# Patient Record
Sex: Female | Born: 1955
Health system: Southern US, Community
[De-identification: ages and names within clinical notes are randomized; demographics above are authoritative.]

## PROBLEM LIST (undated history)

## (undated) DIAGNOSIS — E119 Type 2 diabetes mellitus without complications: Secondary | ICD-10-CM

## (undated) DIAGNOSIS — R7303 Prediabetes: Secondary | ICD-10-CM

## (undated) DIAGNOSIS — F419 Anxiety disorder, unspecified: Secondary | ICD-10-CM

## (undated) DIAGNOSIS — E669 Obesity, unspecified: Secondary | ICD-10-CM

## (undated) DIAGNOSIS — I10 Essential (primary) hypertension: Secondary | ICD-10-CM

## (undated) DIAGNOSIS — R569 Unspecified convulsions: Secondary | ICD-10-CM

## (undated) DIAGNOSIS — G5600 Carpal tunnel syndrome, unspecified upper limb: Secondary | ICD-10-CM

## (undated) DIAGNOSIS — K625 Hemorrhage of anus and rectum: Secondary | ICD-10-CM

## (undated) DIAGNOSIS — R259 Unspecified abnormal involuntary movements: Secondary | ICD-10-CM

## (undated) DIAGNOSIS — R29810 Facial weakness: Secondary | ICD-10-CM

## (undated) DIAGNOSIS — F339 Major depressive disorder, recurrent, unspecified: Secondary | ICD-10-CM

## (undated) DIAGNOSIS — K649 Unspecified hemorrhoids: Secondary | ICD-10-CM

## (undated) HISTORY — DX: Major depressive disorder, recurrent, unspecified: F33.9

## (undated) HISTORY — PX: HERNIA REPAIR: SHX51

## (undated) HISTORY — DX: Anxiety disorder, unspecified: F41.9

## (undated) HISTORY — DX: Type 2 diabetes mellitus without complications: E11.9

## (undated) HISTORY — DX: Facial weakness: R29.810

## (undated) HISTORY — DX: Unspecified hemorrhoids: K64.9

## (undated) HISTORY — DX: Unspecified abnormal involuntary movements: R25.9

## (undated) HISTORY — DX: Prediabetes: R73.03

## (undated) HISTORY — PX: MYOMECTOMY: SHX85

## (undated) HISTORY — DX: Obesity, unspecified: E66.9

## (undated) HISTORY — PX: ABDOMINAL HYSTERECTOMY: SHX81

## (undated) HISTORY — DX: Hemorrhage of anus and rectum: K62.5

---

## 1997-11-20 ENCOUNTER — Encounter: Payer: Self-pay | Admitting: Family Medicine

## 1997-11-20 ENCOUNTER — Ambulatory Visit (HOSPITAL_COMMUNITY): Admission: RE | Admit: 1997-11-20 | Discharge: 1997-11-20 | Payer: Self-pay | Admitting: Family Medicine

## 1998-01-15 ENCOUNTER — Encounter: Admission: RE | Admit: 1998-01-15 | Discharge: 1998-01-15 | Payer: Self-pay | Admitting: Obstetrics & Gynecology

## 2002-01-31 ENCOUNTER — Encounter: Admission: RE | Admit: 2002-01-31 | Discharge: 2002-01-31 | Payer: Self-pay | Admitting: Family Medicine

## 2002-02-14 ENCOUNTER — Other Ambulatory Visit: Admission: RE | Admit: 2002-02-14 | Discharge: 2002-02-14 | Payer: Self-pay | Admitting: Family Medicine

## 2002-02-14 ENCOUNTER — Encounter: Admission: RE | Admit: 2002-02-14 | Discharge: 2002-02-14 | Payer: Self-pay | Admitting: Family Medicine

## 2002-02-28 ENCOUNTER — Encounter: Admission: RE | Admit: 2002-02-28 | Discharge: 2002-02-28 | Payer: Self-pay | Admitting: Family Medicine

## 2002-03-31 ENCOUNTER — Encounter: Admission: RE | Admit: 2002-03-31 | Discharge: 2002-03-31 | Payer: Self-pay | Admitting: Family Medicine

## 2002-04-07 ENCOUNTER — Encounter: Admission: RE | Admit: 2002-04-07 | Discharge: 2002-04-07 | Payer: Self-pay | Admitting: Family Medicine

## 2002-05-09 ENCOUNTER — Encounter: Admission: RE | Admit: 2002-05-09 | Discharge: 2002-05-09 | Payer: Self-pay | Admitting: Family Medicine

## 2002-07-04 ENCOUNTER — Encounter: Admission: RE | Admit: 2002-07-04 | Discharge: 2002-07-04 | Payer: Self-pay | Admitting: Family Medicine

## 2002-12-26 ENCOUNTER — Encounter: Admission: RE | Admit: 2002-12-26 | Discharge: 2002-12-26 | Payer: Self-pay | Admitting: Family Medicine

## 2003-03-13 ENCOUNTER — Encounter: Admission: RE | Admit: 2003-03-13 | Discharge: 2003-03-13 | Payer: Self-pay | Admitting: Family Medicine

## 2003-04-10 ENCOUNTER — Other Ambulatory Visit: Admission: RE | Admit: 2003-04-10 | Discharge: 2003-04-10 | Payer: Self-pay | Admitting: Family Medicine

## 2003-04-10 ENCOUNTER — Encounter: Admission: RE | Admit: 2003-04-10 | Discharge: 2003-04-10 | Payer: Self-pay | Admitting: Family Medicine

## 2004-03-04 ENCOUNTER — Ambulatory Visit: Payer: Self-pay | Admitting: Family Medicine

## 2004-03-24 ENCOUNTER — Ambulatory Visit: Payer: Self-pay | Admitting: Family Medicine

## 2004-04-01 ENCOUNTER — Ambulatory Visit: Payer: Self-pay | Admitting: Sports Medicine

## 2004-04-25 ENCOUNTER — Ambulatory Visit: Payer: Self-pay | Admitting: Sports Medicine

## 2004-05-06 ENCOUNTER — Ambulatory Visit: Payer: Self-pay | Admitting: Sports Medicine

## 2004-05-19 ENCOUNTER — Encounter (INDEPENDENT_AMBULATORY_CARE_PROVIDER_SITE_OTHER): Payer: Self-pay | Admitting: *Deleted

## 2004-05-20 ENCOUNTER — Ambulatory Visit: Payer: Self-pay | Admitting: Family Medicine

## 2004-05-20 ENCOUNTER — Other Ambulatory Visit: Admission: RE | Admit: 2004-05-20 | Discharge: 2004-05-20 | Payer: Self-pay | Admitting: Family Medicine

## 2004-06-03 ENCOUNTER — Ambulatory Visit: Payer: Self-pay | Admitting: Family Medicine

## 2004-08-07 ENCOUNTER — Inpatient Hospital Stay (HOSPITAL_COMMUNITY): Admission: RE | Admit: 2004-08-07 | Discharge: 2004-08-12 | Payer: Self-pay | Admitting: Psychiatry

## 2004-08-07 ENCOUNTER — Ambulatory Visit: Payer: Self-pay | Admitting: Family Medicine

## 2004-08-07 ENCOUNTER — Ambulatory Visit: Payer: Self-pay | Admitting: Psychiatry

## 2004-09-02 ENCOUNTER — Ambulatory Visit: Payer: Self-pay | Admitting: Family Medicine

## 2004-09-30 ENCOUNTER — Ambulatory Visit: Payer: Self-pay | Admitting: Family Medicine

## 2004-10-28 ENCOUNTER — Ambulatory Visit: Payer: Self-pay | Admitting: Family Medicine

## 2004-12-02 ENCOUNTER — Ambulatory Visit: Payer: Self-pay | Admitting: Family Medicine

## 2005-03-05 ENCOUNTER — Ambulatory Visit: Payer: Self-pay | Admitting: Family Medicine

## 2005-07-06 ENCOUNTER — Ambulatory Visit: Payer: Self-pay | Admitting: Family Medicine

## 2006-03-18 DIAGNOSIS — F331 Major depressive disorder, recurrent, moderate: Secondary | ICD-10-CM | POA: Insufficient documentation

## 2006-03-18 DIAGNOSIS — F339 Major depressive disorder, recurrent, unspecified: Secondary | ICD-10-CM

## 2006-03-18 DIAGNOSIS — L408 Other psoriasis: Secondary | ICD-10-CM | POA: Insufficient documentation

## 2006-03-18 DIAGNOSIS — I10 Essential (primary) hypertension: Secondary | ICD-10-CM | POA: Insufficient documentation

## 2006-03-18 HISTORY — DX: Major depressive disorder, recurrent, unspecified: F33.9

## 2006-03-19 ENCOUNTER — Encounter (INDEPENDENT_AMBULATORY_CARE_PROVIDER_SITE_OTHER): Payer: Self-pay | Admitting: *Deleted

## 2006-07-09 ENCOUNTER — Ambulatory Visit: Payer: Self-pay | Admitting: Family Medicine

## 2006-07-09 ENCOUNTER — Encounter: Payer: Self-pay | Admitting: Family Medicine

## 2006-07-09 DIAGNOSIS — L708 Other acne: Secondary | ICD-10-CM

## 2006-07-09 DIAGNOSIS — L709 Acne, unspecified: Secondary | ICD-10-CM | POA: Insufficient documentation

## 2006-08-09 ENCOUNTER — Ambulatory Visit: Payer: Self-pay | Admitting: Family Medicine

## 2006-08-09 ENCOUNTER — Encounter: Admission: RE | Admit: 2006-08-09 | Discharge: 2006-08-09 | Payer: Self-pay | Admitting: Sports Medicine

## 2006-08-09 ENCOUNTER — Encounter: Payer: Self-pay | Admitting: Family Medicine

## 2006-08-09 ENCOUNTER — Ambulatory Visit (HOSPITAL_COMMUNITY): Admission: RE | Admit: 2006-08-09 | Discharge: 2006-08-09 | Payer: Self-pay | Admitting: Family Medicine

## 2006-08-10 ENCOUNTER — Encounter: Payer: Self-pay | Admitting: Family Medicine

## 2006-08-10 LAB — CONVERTED CEMR LAB
HCT: 24.5 % — ABNORMAL LOW (ref 36.0–46.0)
MCHC: 28.6 g/dL — ABNORMAL LOW (ref 30.0–36.0)
MCV: 64 fL — ABNORMAL LOW (ref 78.0–100.0)
Platelets: 527 10*3/uL — ABNORMAL HIGH (ref 150–400)
RBC: 3.83 M/uL — ABNORMAL LOW (ref 3.87–5.11)
WBC: 5.5 10*3/uL (ref 4.0–10.5)

## 2006-08-11 ENCOUNTER — Ambulatory Visit: Payer: Self-pay | Admitting: Family Medicine

## 2006-08-11 ENCOUNTER — Encounter: Payer: Self-pay | Admitting: Family Medicine

## 2006-09-01 ENCOUNTER — Telehealth: Payer: Self-pay | Admitting: *Deleted

## 2006-09-16 ENCOUNTER — Ambulatory Visit: Payer: Self-pay | Admitting: Obstetrics & Gynecology

## 2006-09-16 ENCOUNTER — Other Ambulatory Visit: Admission: RE | Admit: 2006-09-16 | Discharge: 2006-09-16 | Payer: Self-pay | Admitting: Obstetrics and Gynecology

## 2006-09-16 ENCOUNTER — Encounter: Payer: Self-pay | Admitting: Obstetrics and Gynecology

## 2006-09-22 ENCOUNTER — Ambulatory Visit (HOSPITAL_COMMUNITY): Admission: RE | Admit: 2006-09-22 | Discharge: 2006-09-22 | Payer: Self-pay | Admitting: Obstetrics & Gynecology

## 2006-09-30 ENCOUNTER — Ambulatory Visit: Payer: Self-pay | Admitting: Obstetrics and Gynecology

## 2006-10-08 ENCOUNTER — Encounter: Payer: Self-pay | Admitting: Family Medicine

## 2006-11-02 ENCOUNTER — Ambulatory Visit: Payer: Self-pay | Admitting: Gynecology

## 2006-11-02 ENCOUNTER — Inpatient Hospital Stay (HOSPITAL_COMMUNITY): Admission: RE | Admit: 2006-11-02 | Discharge: 2006-11-05 | Payer: Self-pay | Admitting: Obstetrics and Gynecology

## 2006-11-02 ENCOUNTER — Encounter: Payer: Self-pay | Admitting: Obstetrics and Gynecology

## 2006-11-10 ENCOUNTER — Ambulatory Visit: Payer: Self-pay | Admitting: Obstetrics and Gynecology

## 2006-11-17 ENCOUNTER — Inpatient Hospital Stay (HOSPITAL_COMMUNITY): Admission: AD | Admit: 2006-11-17 | Discharge: 2006-12-01 | Payer: Self-pay | Admitting: Obstetrics & Gynecology

## 2006-11-22 ENCOUNTER — Encounter: Payer: Self-pay | Admitting: Obstetrics and Gynecology

## 2006-12-06 ENCOUNTER — Inpatient Hospital Stay (HOSPITAL_COMMUNITY): Admission: AD | Admit: 2006-12-06 | Discharge: 2006-12-06 | Payer: Self-pay | Admitting: Obstetrics and Gynecology

## 2006-12-22 ENCOUNTER — Ambulatory Visit: Payer: Self-pay | Admitting: Obstetrics & Gynecology

## 2007-01-04 ENCOUNTER — Ambulatory Visit: Payer: Self-pay | Admitting: Family Medicine

## 2007-01-04 ENCOUNTER — Encounter: Payer: Self-pay | Admitting: Family Medicine

## 2007-01-04 DIAGNOSIS — R259 Unspecified abnormal involuntary movements: Secondary | ICD-10-CM

## 2007-01-04 HISTORY — DX: Unspecified abnormal involuntary movements: R25.9

## 2007-01-04 LAB — CONVERTED CEMR LAB
BUN: 10 mg/dL (ref 6–23)
CO2: 21 meq/L (ref 19–32)
Calcium: 9.4 mg/dL (ref 8.4–10.5)
Chloride: 109 meq/L (ref 96–112)
HCT: 37.1 % (ref 36.0–46.0)
Platelets: 335 10*3/uL (ref 150–400)
RBC: 4.44 M/uL (ref 3.87–5.11)
Sodium: 143 meq/L (ref 135–145)

## 2007-02-09 ENCOUNTER — Encounter: Payer: Self-pay | Admitting: Family Medicine

## 2007-04-25 ENCOUNTER — Telehealth (INDEPENDENT_AMBULATORY_CARE_PROVIDER_SITE_OTHER): Payer: Self-pay | Admitting: *Deleted

## 2007-07-20 ENCOUNTER — Encounter: Payer: Self-pay | Admitting: Family Medicine

## 2007-07-20 ENCOUNTER — Ambulatory Visit: Payer: Self-pay | Admitting: Family Medicine

## 2007-07-20 DIAGNOSIS — L658 Other specified nonscarring hair loss: Secondary | ICD-10-CM

## 2007-07-20 DIAGNOSIS — M2140 Flat foot [pes planus] (acquired), unspecified foot: Secondary | ICD-10-CM | POA: Insufficient documentation

## 2007-07-20 DIAGNOSIS — E8941 Symptomatic postprocedural ovarian failure: Secondary | ICD-10-CM

## 2007-07-20 LAB — CONVERTED CEMR LAB
BUN: 10 mg/dL (ref 6–23)
Calcium: 10.3 mg/dL (ref 8.4–10.5)
Chloride: 104 meq/L (ref 96–112)
Creatinine, Ser: 0.72 mg/dL (ref 0.40–1.20)
Sodium: 139 meq/L (ref 135–145)

## 2007-07-21 ENCOUNTER — Encounter: Payer: Self-pay | Admitting: Family Medicine

## 2007-07-28 ENCOUNTER — Telehealth: Payer: Self-pay | Admitting: Family Medicine

## 2007-09-01 ENCOUNTER — Encounter: Payer: Self-pay | Admitting: Family Medicine

## 2007-09-20 ENCOUNTER — Telehealth: Payer: Self-pay | Admitting: Family Medicine

## 2007-10-17 ENCOUNTER — Ambulatory Visit: Payer: Self-pay | Admitting: Family Medicine

## 2007-10-17 DIAGNOSIS — E669 Obesity, unspecified: Secondary | ICD-10-CM

## 2007-10-17 DIAGNOSIS — Z6838 Body mass index (BMI) 38.0-38.9, adult: Secondary | ICD-10-CM | POA: Insufficient documentation

## 2007-10-17 HISTORY — DX: Obesity, unspecified: E66.9

## 2007-10-20 ENCOUNTER — Telehealth: Payer: Self-pay | Admitting: Family Medicine

## 2008-04-16 IMAGING — CR DG ABDOMEN 2V
2 series · 2 of 2 positions shown · non-contrast
Comparison: Previous exam on 11/21/06.

CLINICAL DATA: Small bowel obstruction.  
 ABDOMEN ? 2 VIEW:

[view not recorded (1 of 2)]
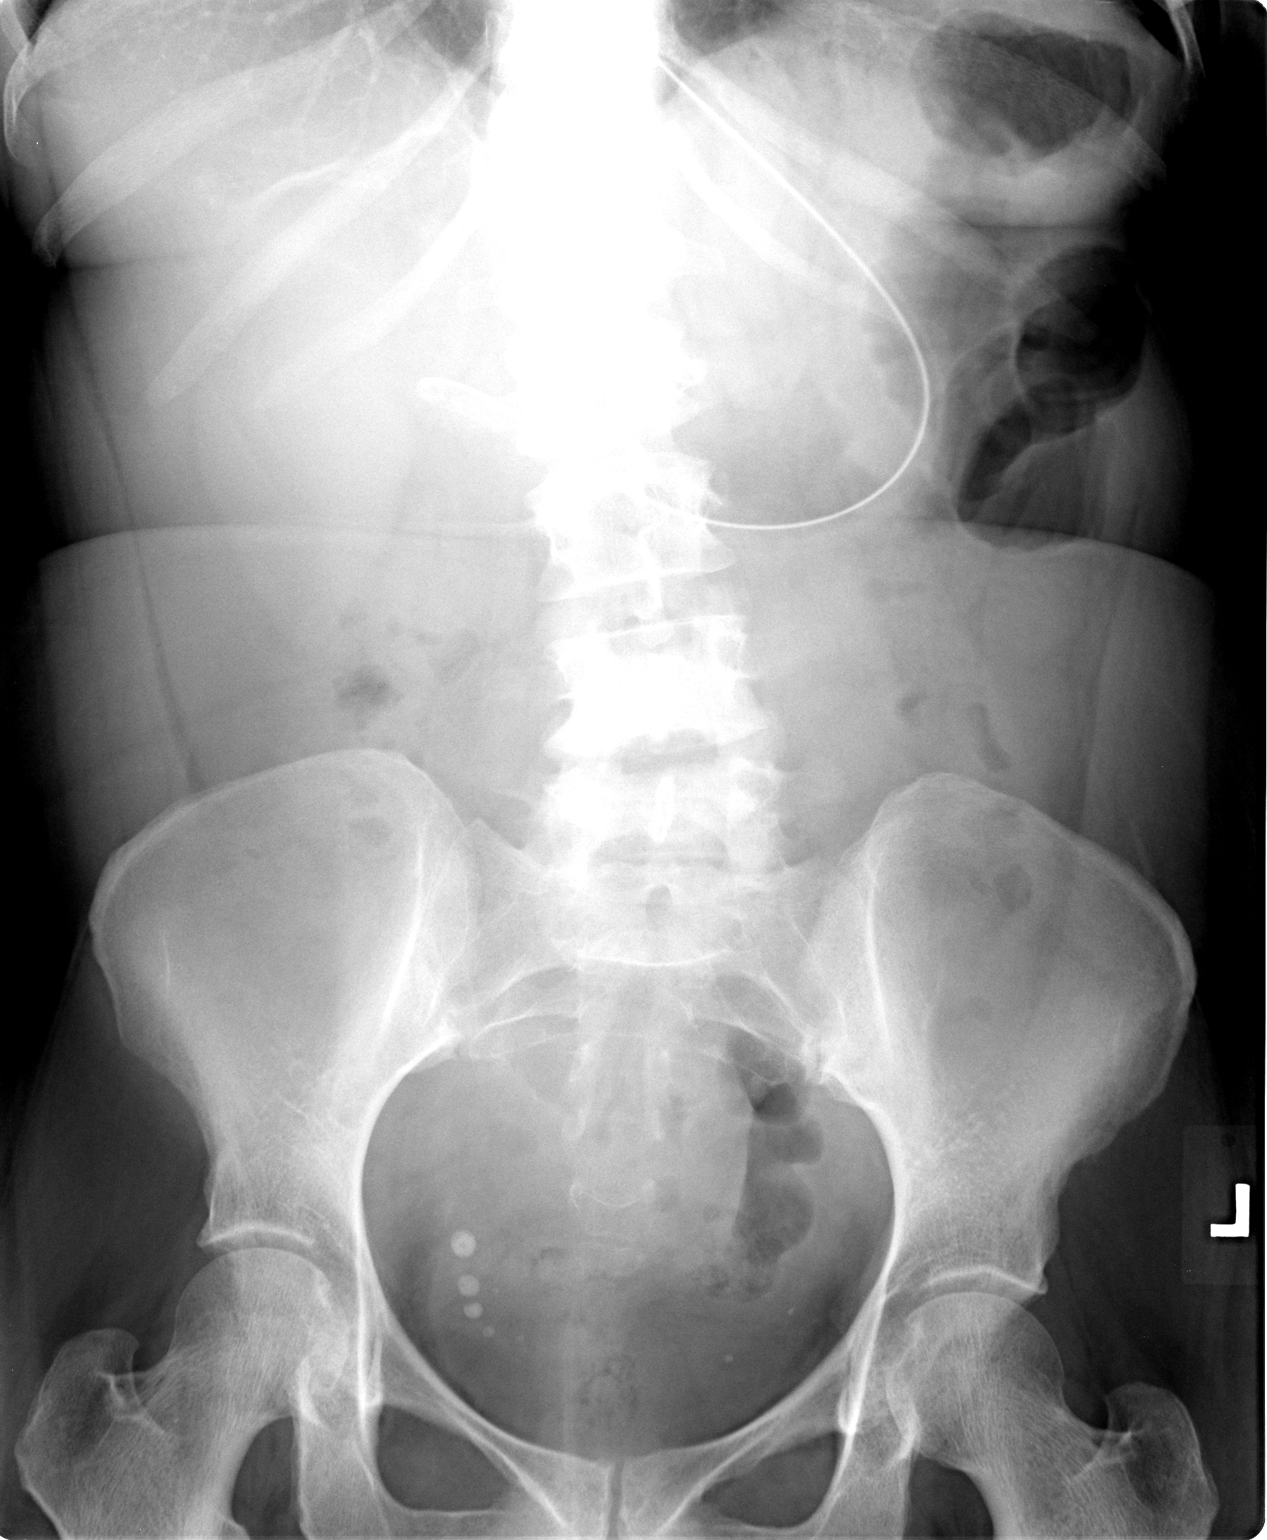

[view not recorded (2 of 2)]
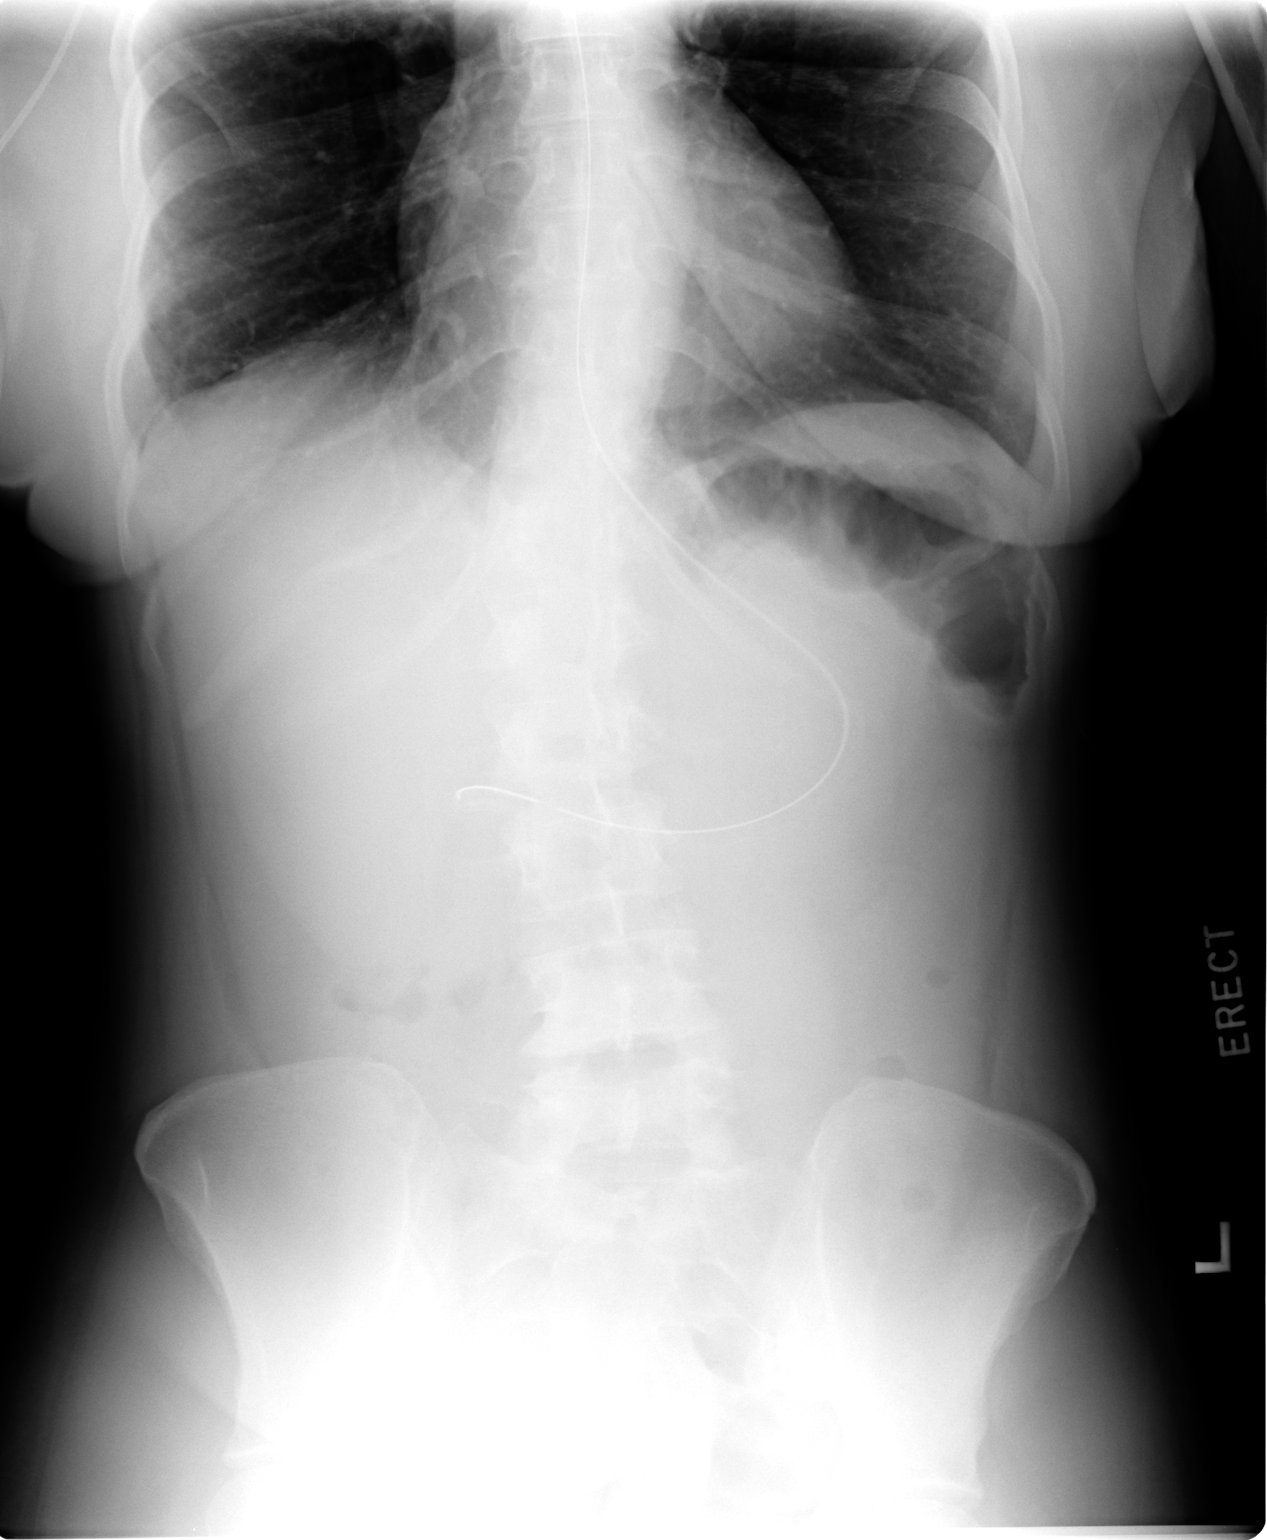

[2 of 2 positions shown; findings below may reference images not displayed]

FINDINGS: A few mildly dilated small bowel loops persist in the left upper quadrant.  No air fluid levels are seen on the upright film however to confirm continued small bowel obstruction in this location.  A nasogastric tube is in place with the tip located in the region of the gastric antrum.  A minimal amount of bowel gas is seen throughout the remainder of the abdomen with no definitive colonic gas evident.  Bony structures remain unchanged.
IMPRESSION: Persistent mildly distended small bowel loops in the left upper quadrant with no definite air fluid levels seen today.  A paucity of bowel gas is otherwise seen raising concern for persistent partial or complete small bowel obstructive process.

## 2008-04-18 IMAGING — CR DG ABDOMEN 1V
1 series · 1 of 1 positions shown · non-contrast
Comparison: 11/26/06.

CLINICAL DATA: Followup small bowel obstruction. 
ABDOMEN ? 1 VIEW:

[view not recorded]
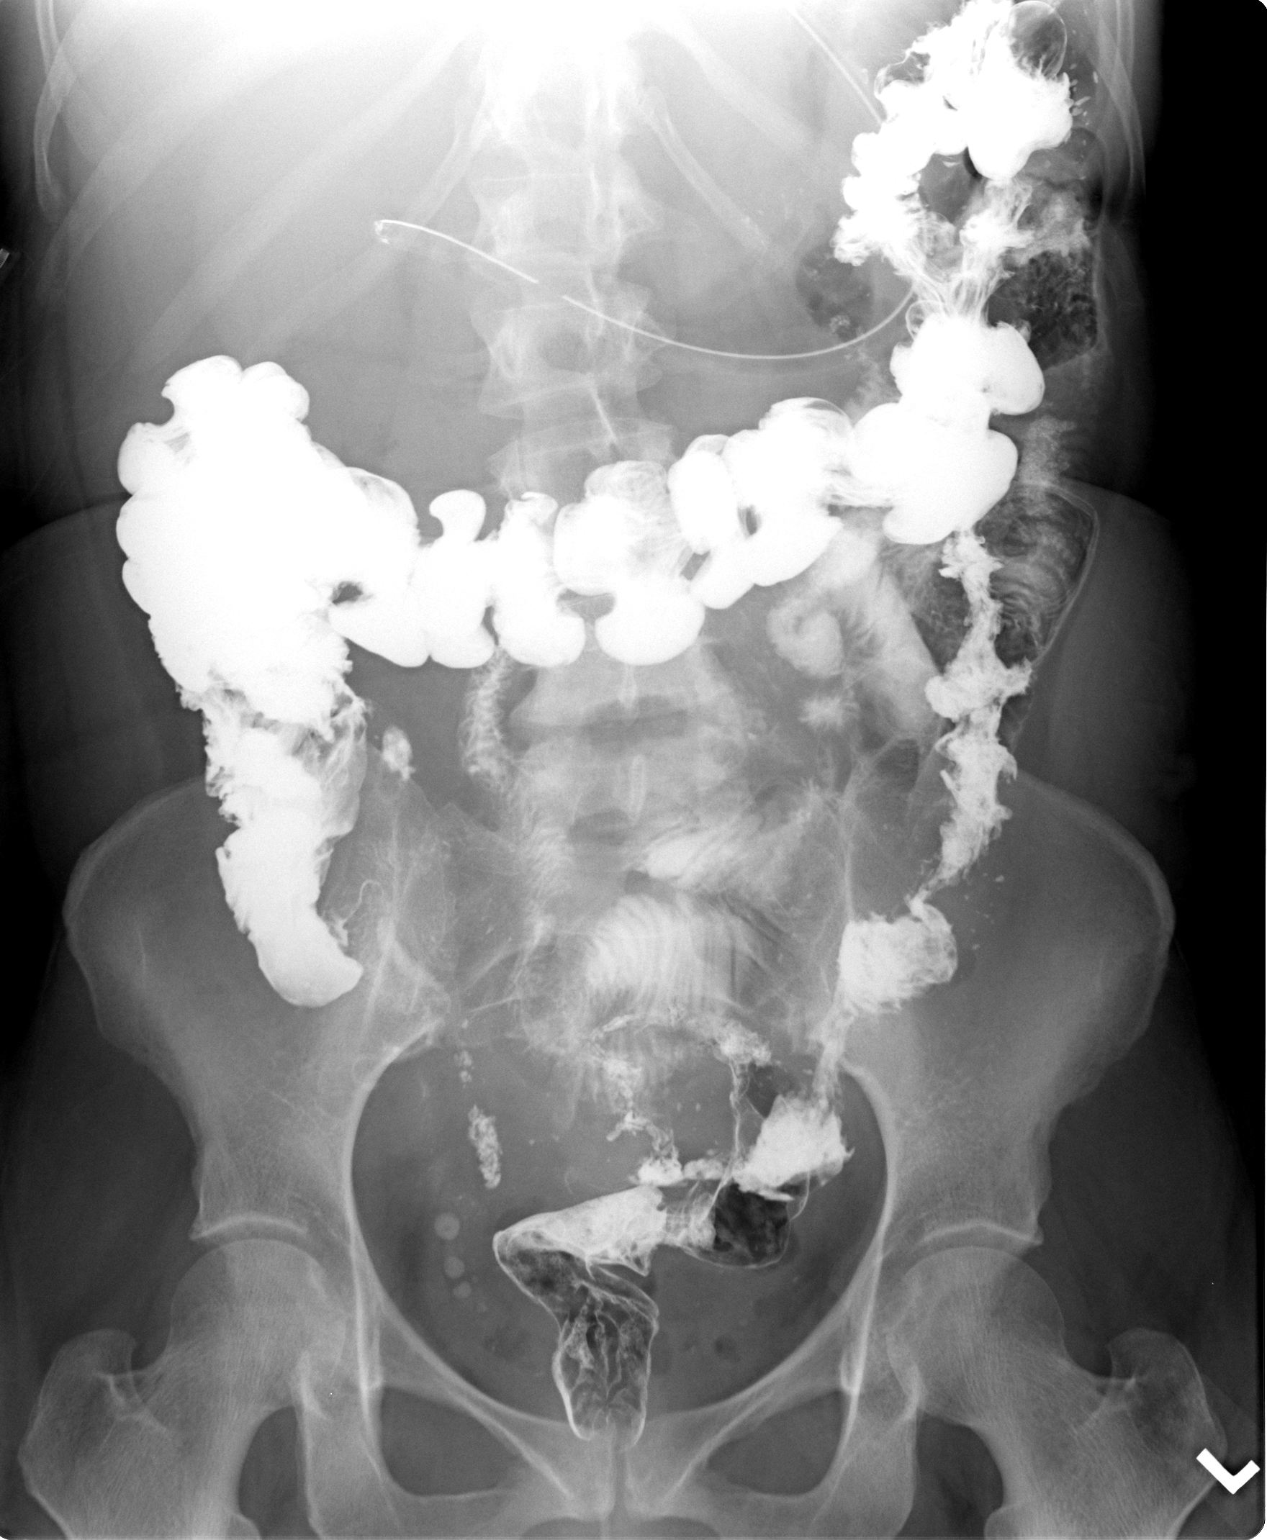

[1 of 1 positions shown; findings below may reference images not displayed]

FINDINGS: There has been passage of contrast from the small bowel throughout the majority of the colon since the prior study.  Minimal residual contrast is seen within the distal small bowel loops, several of which are mildly dilated but appear decreased since the prior study.  Nasogastric tube remains in place.
IMPRESSION: Decreased small bowel dilatation, with passage of contrast into the colon since the prior study.

## 2010-02-16 LAB — CONVERTED CEMR LAB
BUN: 14 mg/dL (ref 6–23)
CO2: 23 meq/L (ref 19–32)
Calcium: 9.2 mg/dL (ref 8.4–10.5)
Ferritin: <1 ng/mL — ABNORMAL LOW (ref 10–291)
Iron: 14 ug/dL — ABNORMAL LOW (ref 42–145)
Saturation Ratios: 4 % — ABNORMAL LOW (ref 20–55)
Sodium: 138 meq/L (ref 135–145)
Total Protein: 7.8 g/dL (ref 6.0–8.3)

## 2010-02-26 ENCOUNTER — Encounter: Payer: Self-pay | Admitting: *Deleted

## 2010-06-03 NOTE — Op Note (Signed)
[name], [name]             ACCOUNT NO.:  [id number]   MEDICAL RECORD NO.:  [id number]          PATIENT TYPE:  INP   LOCATION:  9320                          [facility]   PHYSICIAN:  Phil D. Okey Dupre, M.D.     DATE OF BIRTH:  [birth year]   DATE OF PROCEDURE:  11/02/2006  DATE OF DISCHARGE:                               OPERATIVE REPORT   PROCEDURE:  Abdominal hysterectomy, bilateral salpingo-oophorectomy and  lysis of pelvic adhesions.  Also, a ventral hernia repair, which will be  dictated separately.   SURGEON:  Javier Glazier. Okey Dupre, M.D.   FIRST ASSISTANT:  Ginger Carne, MD.   ANESTHESIA:  General.   ESTIMATED BLOOD LOSS:  500 mL.   SPECIMENS TO PATHOLOGY:  Uterus and ovaries.   POSTOPERATIVE CONDITION:  Satisfactory.   ANESTHESIA:  General.   Tape, instrument, sponge and needle count reported correct at the end of  the procedure.   OPERATIVE FINDINGS:  On entering the peritoneal cavity, the uterus was  markedly irregular and about the size of a 16-week pregnancy, markedly  tied down with a thick pelvic adhesions.  The left ovary was trapped  behind the broad ligament.  The right ovary was attached to the right  pelvic wall and was dominated by a large cyst measuring 10 x 7 x 7 cm,  translucent and clear in appearance with enlarged dilated veins in the  utero-ovarian ligament.  Also noted was a ventral hernia which was in  the superior portion of the incision just above the umbilicus right in  the midline and measured 3-4 cm in diameter right between the rectus  muscles.   DESCRIPTION OF PROCEDURE:  Under satisfactory general anesthesia with  the patient in dorsal supine position, the abdomen and vagina having  been prepped and a Foley catheter placed in the urinary bladder.  The  abdomen was draped in the usual sterile manner.  The abdomen was entered  through a vertical midline incision extending from the symphysis pubis  up hockey-sticking around the umbilicus  and going up approximately 4 cm  and back to the midline and up above the umbilicus to allow for future  repair during this procedure of the umbo of the ventral hernia.  The  abdomen was then entered by layers.  Subcutaneous bleeders were  controlled with hot cautery on entry.  The findings were as above.  The  Balfour retractor was placed in the peritoneal cavity.  The uterus  brought out of the incision.  The pelvic adhesions had to be freed up by  sharp and blunt dissection in order to do this.  Both ureters were  identified during this procedure.  The right ovary, which was plastered  down to the right wall of the uterus, was separated by sharp dissection  of the adhesions holding it there and sharp dissection to clear the  adhesions from the cul-de-sac.  Once this was done, the right round  ligament was ligated, divided and the anterior leaf of the broad  ligament opened parallel to the uterus and extending around the anterior  superior portion  of the cervix.  A straight Kocher clamp had been placed  across the utero-ovarian ligament, the fallopian tube, the round  ligament just lateral to the uterus on that side for traction and  hemostasis.  Once this was done, the area around the infundibular pelvic  ligament on that side was freed up of adhesions by sharp and blunt  dissection.  The infundibular pelvic ligament was clamped after  identifying once again the ureter, divided and doubly ligated with 1  chromic catgut suture ligatures.  This helped free up the ovary which  needed more adhesion lysing to free it up.  Once this was done, a clamp  was placed across the remaining pedicle close to the uterus and through  an opening made in avascular portion of broad ligament.  The tissue  between them divided and the ovary dissected free and sent for frozen  section.  The round ligament then on the left side was identified and  ligated with #1 chromic catgut suture ligature, divided and  anterior  leaf of the broad ligament opened parallel to the uterus around to the  anterior superior portion of the cervix.  The bladder was freed up by  sharp dissection and the bladder pushed away from the lower uterine  segment by sharp and blunt dissection.  The uterine vessels were then  skeletonized on the left side.  The ovary was plastered against the  posterior leaf of the broad ligament so some of that was included in the  clamp that was used to catch the dissected free uterine vessels with a  Heaney clamp.  The vessels were divided and ligated with 1 chromic  catgut suture ligature.  The cardinal ligaments on that side were then  clamped with a straight Heaney clamp, divided and ligated with 1 chromic  catgut suture ligature making sure that we identified the ureter prior  to doing this.  Returning to the left side, the uterine vessels were  skeletonized, divided and ligated with 1 chromic catgut suture ligatures  after having been clamped with a curved Heaney clamp.  The cardinal  ligament was then similarly grasped with a straight Haney clamp, divided  and ligated with 1 chromic catgut suture ligature.  Once this was done  of the utero-ovarian ligament on the left, we returned to that, was  freed up identified, ligated with 1 chromic, divided after having been  clamped with a Heaney clamp and ligated with 1 chromic catgut suture  ligatures and a free tie of 1 chromic catgut.  The remaining portion of  the broad ligament on the left was judiciously divided, dissected away  from the area, making sure that we were well away from the ureter,  hemostat clamping bleeders along the way and ligating them with 1  chromic catgut suture ligatures and thus removing the ovary on that side  separately.  Once this was done, we were already down to the apex of the  vagina and curved Heaney clamps were used to clamp across the vagina and  the cervix was dissected away from the apex of the  vagina by sharp  dissection.  Figure-of-eight 1 chromic catgut sutures were used for each  of the lateral vaginal cuff sutures that included the angle of the  vaginal cuff on each side.  This having am accomplished, the uterus was  removed in toto.  At this point both ovaries had been removed with the  tubes and the pelvis was irrigated with normal saline.  No active  bleeding was noted.  Attention was then directed to the ventral hernia  which will be dictated separately.  Once the hernia repair had been  carried out, the rectus fascia was closed with a 0 PDS double suture  with a loop in the Smead-Jones type of closure.  This was run down  halfway towards the symphysis, at which point a second suture was used  of the same material to finish the closure of the fascia.  Subcutaneous  bleeders, once again were controlled with hot cautery.  The subcutaneous  tissue was irrigated with normal saline and skin edges were approximated  with skin staples.  A dry sterile dressing was applied.  The patient was  transferred to the recovery room in satisfactory condition having  tolerated procedure well with the Foley catheter draining clear amber  urine.  Tape, sponge and needle count was reported correct at the end of  the procedure as well as the instruments      Phil D. Okey Dupre, M.D.  Electronically Signed     PDR/MEDQ  D:  11/02/2006  T:  11/03/2006  Job:  [id number]

## 2010-06-03 NOTE — Discharge Summary (Signed)
[name], [name]             ACCOUNT NO.:  [id number]   MEDICAL RECORD NO.:  [id number]          PATIENT TYPE:  INP   LOCATION:  9320                          [facility]   PHYSICIAN:  Phil D. Okey Dupre, M.D.     DATE OF BIRTH:  [birth year]   DATE OF ADMISSION:  11/02/2006  DATE OF DISCHARGE:  11/05/2006                               DISCHARGE SUMMARY   The patient a 55 year old African-American female who underwent  exploratory laparotomy, total abdominal hysterectomy and bilateral  salpingo-oophorectomy with lysis of pelvic adhesions and repair of  ventral hernia on the day of admission.  She has done well  postoperatively with the exception of a falling hemoglobin although her  abdomen is benign.  There is no sign of active bleeding.  The patient  had a hemoglobin of around 9 in the recovery room.  It went down to 6.9  on postoperative day 1 and 5.2 on postoperative day #2.  She was given 2  units of packed cells to raise it to 7.1.  The patient is stable.  She  has never had any significant dizziness and has had normal orthostatics  since the surgery.   EXAMINATION ON THE DAY OF DISCHARGE:  HEENT:  Within normal limits.  NECK:  Supple with no masses.  LUNGS:  Clear to auscultation and percussion.  HEART:  No murmur.  Normal sinus rhythm.  PMI in fifth intercostal space  in midclavicular line.  ABDOMEN:  Soft, flat, normally tender with a large well-healing midline  incision with no sign of erythema or inflammation.  Active bowel sounds.  No CVA tenderness.  __________ .  No significant genital bleeding.  EXTREMITIES:  Negative Homans' sign.  No edema.  DTRs within normal  limits.   IMPRESSION:  The patient in satisfactory condition, ready for discharge.  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  The patient's plan is to receive one more  unit of packed cells,  i.e., for a total of three prior to her discharge.  Follow up in 5 to 6 days in the GYN clinic for staple removal, and then  she will be seen again in 2 weeks for a routine postoperative  evaluation.  She has been given strict activity and dietary instructions  as well as those in relation to followup.  We have especially cautioned  her against heavy lifting and stairs to try and keep those to a minimum.   MEDICATIONS ON DISCHARGE:  New medications will be:  1. Percocet 5 mg #40 q.4 h p.r.n. for pain.  2. Motrin 600 mg #60 one q.6 h p.r.n. for pain.  3. Slow Fe #90 one q.a.m. once bowel movements begin to be regular.   Her preoperative medications will stay the same except that she will no  longer be on Loestrin, she will not be taking her regular iron, and she  will probably follow up with her Botox injections in her leg with her  doctor in [place].   DISCHARGE DIAGNOSIS:  Satisfactory  progress post total abdominal  hysterectomy, bilateral salpingo-oophorectomy and repair of ventral  hernia      Phil D. Okey Dupre, M.D.  Electronically Signed     PDR/MEDQ  D:  11/05/2006  T:  11/06/2006  Job:  [id number]

## 2010-06-03 NOTE — Op Note (Signed)
[name], CASEBIER             ACCOUNT NO.:  [id number]   MEDICAL RECORD NO.:  [id number]          PATIENT TYPE:  INP   LOCATION:  9320                          [facility]   PHYSICIAN:  Phil D. Okey Dupre, M.D.     DATE OF BIRTH:  21-May-1955   DATE OF PROCEDURE:  DATE OF DISCHARGE:                               OPERATIVE REPORT   PROCEDURE:  Ventral hernia repair.   PREOPERATIVE DIAGNOSIS:  Ventral hernia, symptomatic.   POSTOPERATIVE DIAGNOSIS:  Ventral hernia, symptomatic.   SURGEON:  Ginger Carne, MD   FIRST ASSISTANT:  Dr. Kristen Loader   ANESTHESIA:  General.   ESTIMATED BLOOD LOSS:  Minimal.   SPECIMENS TO PATHOLOGY:  None.   POSTOPERATIVE CONDITION:  Satisfactory.   This procedure was done as a portion of the total abdominal  hysterectomy, bilateral salpingo-oophorectomy, lysis of adhesions  previously dictated.  This portion was undertaken by Dr. Mia Creek with  the incision opened as in the previous dictation.  The hernia was  repaired by closing the rectus fascia over the hernia defect using  interrupted 0 Prolene sutures.  This was done with virtually no blood  loss.  The area was observed.  There was no sign of any defect at this  end of this procedure and the incision closure was carried out as  previously dictated.      Phil D. Okey Dupre, M.D.  Electronically Signed     PDR/MEDQ  D:  11/02/2006  T:  11/03/2006  Job:  [id number]

## 2010-06-03 NOTE — Group Therapy Note (Signed)
[name], CARBONI NO.:  [id number]   MEDICAL RECORD NO.:  [id number]          PATIENT TYPE:  WOC   LOCATION:  [facility]                   [facility]AC[facility]   PHYSICIAN:  Argentina Donovan, MD        DATE OF BIRTH:  [birth year]   DATE OF SERVICE:                                  CLINIC NOTE   The patient is a 55 year old gravida 2, para 1-0-1-1 with a daughter age  12 who was last seen by Dr. Silas Flood 2 weeks ago because of extremely heavy  periods.  She had an ultrasound which showed a 22-week size uterus with  multiple irregular full fibroids.  She came in today for results, is  still bleeding, and we have talked to her about alternatives and we are  going to schedule her for total abdominal hysterectomy.  In addition to  this, she has a ventral hernia that was picked up on ultrasound.  It is  easily palpable when the abdomen is examined.  It will be corrected at  that time.   PAST MEDICAL HISTORY:  The patient has had 1 cesarean section.  She has  had normal Pap smears and her heavy bleeding started about 1 year ago.  The patient has medical history of focal dystonia for which the patient  is followed by Dr. __________  In Neurology in [place], and he is  injecting her calf muscle with Botox.  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   MEDICATION:  She is taking iron sulfate, Zoloft 50 mg a day, has been on  Loestrin and takes __________ 25 a day for blood pressure.   PHYSICAL EXAMINATION:  VITAL SIGNS:  Her blood pressure is 111/81,  weight is 150, the pulse is 80, temperatures is 97.6.  ABDOMEN:  Soft, easily palpable mass up to the umbilicus with an obvious  hernia above the umbilicus where the bowel can be palpated and which is  causing her discomfort.   PLAN:  The patient will come and be seen prior to the surgery.  Will  schedule this with myself or Dr. Silas Flood and Dr. Mia Creek.           ______________________________  Argentina Donovan,  MD     PR/MEDQ  D:  09/30/2006  T:  10/01/2006  Job:  [id number]

## 2010-06-03 NOTE — Discharge Summary (Signed)
NAMEDANALI, [name]             ACCOUNT NO.:  [id number]   MEDICAL RECORD NO.:  [id number]            PATIENT TYPE:   LOCATION:                                [facility]   PHYSICIAN:  Phil D. Okey Dupre, M.D.          DATE OF BIRTH:   DATE OF ADMISSION:  11/02/2006  DATE OF DISCHARGE:                               DISCHARGE SUMMARY   CHIEF COMPLAINT:  Abnormal uterine bleeding.   The patient is a 55 year old, gravida 2, para 1-0-1-1, who presented to  our clinic from referral because of continual vaginal bleeding. At one  time she had hematocrit of 24 and was placed on iron therapy. She has  had extremely heavy periods over the past several years, and, on  examination, she has an enormous uterus that goes up above the  umbilicus. We have talked about the alternatives, and she desires to go  ahead with abdominal hysterectomy. In addition to this, she has a  ventral hernia that was picked up on the ultrasound, easily palpable in  the abdomen was examined and will be corrected at the time of surgery.   The patient and I discussed all the possible problems associated with  the choice of hysterectomy, especially those related to anesthetic  complications and complications related to injury to bowel, urinary  tract, vascular accidents as well as hemorrhage either during and post  operative and infection.  We have also discussed the possibility of deep  vein thrombosis and pulmonary embolism.  The patient realizes that she  can never have any children again.  This has not been a concern for her  considering that she is 55 years old.  The patient asked all the  questions that she desired. We talked about the changes she may feel and  also discussed the restrictions that were necessary following surgery.   PAST GYNECOLOGICAL HISTORY:  She has always had normal Pap smears.  Last  one was in June 2008.  Normal mammogram in July 2008.  And on her first  visit referral to our clinic, she had  endometrial biopsy which was done  on August 28 with features suggestive of exogenous hormone effect.  No  evidence of hyperplasia nor malignancy.  The uterus at that time sounded  to 10 cm.   PAST OB HISTORY:  Gravida 2, para 1-0-1-1, one cesarean section.   PAST MEDICAL HISTORY:  1. 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 Colon Flattery, and he is giving her      Botox injections.  2. The patient has a history of asthma.  3. Pneumonia.  4. Hypercholesteremia.  5. Hypertension.  6. Thyroid problems.   PAST SURGICAL HISTORY:  Besides the cesarean section, she has had  operations on her right and left foot   MEDICATIONS:  1. Zoloft 50 mg 1 p.o. daily.  2. Enerzip 25 mg 1 p.o. daily.  3. Skelaxin 800 one p.o. 4 times a day.  4. She has been taking  iron to build up her hemoglobin.   ALLERGIES:  CODEINE, TOMATOES, and LATEX.   REVIEW OF SYSTEMS:  She has pain in her legs which bothers her  considerably and which is caused by her focal dystonia. She also has  carpal tunnel syndrome of her right wrist for which she is receiving  treatment. Her blood pressure here is normal, and as far she knows, she  is on nothing for blood pressure.   PHYSICAL EXAMINATION:  VITAL SIGNS:  Blood pressure is 111/78, pulse 61,  temperature 97, respirations 14 per minute.  Weight 150 pounds and 5  feet 2 inches tall.  GENERAL:  A well-developed, well-nourished female in no acute distress.  HEENT:  Within normal limits.  PERRLA.  LUNGS:  Clear to auscultation and percussion bilaterally.  NECK:  Supple.  Thyroid is symmetrical with no masses.  HEART:  Regular rate and rhythm.  No murmur, no gallop.  No rubs.  ABDOMEN:  Soft, nontender, nondistended, but the uterus could be felt up  to the umbilicus and above that.  An obvious ventral hernia was easily  palpable.  PELVIC EXAM:  External Genitalia: Normal.  BUS is within normal limits.   The vagina was clean and well rugated. The cervix was clean, nulliparous  and difficult to view.  A speculum had be used in order to visualize  cervix.  The adnexa could not be palpated because of the large uterus  which extended up to the umbilicus and was markedly irregular.   RESULTS:  The patient has had a recent CBC with a hemoglobin of 10 and a  hematocrit of 34.  She had an ultrasound which revealed a 22 cm fibroid  uterus.  As I said, her Pap smear was normal.   FAMILY HISTORY:  Noncontributory.   SOCIAL HISTORY:  The patient does not smoke or drink or take illicit  drugs.   IMPRESSION:  Symptomatic leiomyomata uteri with secondary anemia.   PLAN:  Total abdominal hysterectomy and repair of ventral symptomatic  hernia.      Phil D. Okey Dupre, M.D.  Electronically Signed     PDR/MEDQ  D:  11/01/2006  T:  11/01/2006  Job:  [id number]

## 2010-06-03 NOTE — Discharge Summary (Signed)
[name], [name]             ACCOUNT NO.:  [id number]   MEDICAL RECORD NO.:  [id number]          PATIENT TYPE:  INP   LOCATION:  9305                          [facility]   PHYSICIAN:  Phil D. Okey Dupre, M.D.     DATE OF BIRTH:  [birth year]   DATE OF ADMISSION:  11/17/2006  DATE OF DISCHARGE:  12/01/2006                               DISCHARGE SUMMARY   HISTORY:  This patient is a 55 year old African American female who  underwent total abdominal hysterectomy, bilateral salpingo-oophorectomy  and supraumbilical hernia repair on the date of admission with her first  hospital admission on October 14.  She did well at home until several  days before October 29 i.e., October 25 when she began having nausea,  vomiting and abdominal swelling.  She came in on October 29 and was  admitted with a small bowel obstruction.  Nasogastric tube was placed  and the plan was for exploration on this patient.  On the day that we  planned the exploration general surgeon consult told us he thought it  would be better because of the lack of planes since we passed the 2 week  window of opportunity that perhaps we should wait this out and just keep  the patient on parenteral nutrition and observation as long as there was  no sign of bowel compromise.  This was done and the patient evaluated  day by day.  The abdomen never became very distended because of the  nasogastric tube and then we got to the point that we did not think the  patient was getting any better.  We called the general surgery consult.  He once again suggested that we wait it out and a day or so later she  started having bowel movements, having bowel sounds and shortly  afterward been able to take clear liquids.  Nasogastric tube was removed  at that time.  She had a difficult time tolerating full liquids with  lactose so once the lactose was stopped the patient tolerated full  liquids very well, has been doing so for several days.  The  abdomen is  very benign, flat with active bowel sounds.  The patient had two bowel  movements yesterday and is being discharged on a full liquid diet minus  dairy.  She has also been encouraged to take two cans of Ensure per day  and I am going to see her in the maternity admissions office in 5 days.   The patient's labs have been surprisingly normal, although she has had a  slight increase in her SGPT and her alkaline phosphatase which will be  followed up.  The discharge instructions to this patient has been to  come into the MAU in a few days, to stay on mainly clear liquids with  supplemental Ensure, to return if she has any nausea, vomiting,  abdominal bloating, discharging her on Metamucil, Ensure and full  liquids.  The physical examination on discharge revealed HEENT within  normal limits.  Neck is supple.  Lungs clear to auscultation and  percussion.  Heart no murmur, normal sinus rhythm.  The abdomen is soft,  flat, incision healing well.  Good bowel sounds, nontender.  The  extremities are negative.  No significant genital discharge.  The  patient has a hemoglobin of 10.4 and hematocrit of 30.4 at discharge  with white count of 7.9, platelet count of 353.  Magnesium and  phosphorus and triglycerides all normal.  Comprehensive metabolic panel  was normal with the exception of a low albumin at 2.5 and an elevated  SGPT at 106, alkaline phos at 199.   The plan is to see the patient in the MAU in 5 days, increase her diet  at that time and then have her revisit 1 week later.   DISCHARGE DIAGNOSIS:  Postoperative small bowel obstruction, resolved,  with right lower quadrant hematoma, failed aspiration by interventional  radiologist during this hospitalization.      Phil D. Okey Dupre, M.D.  Electronically Signed     PDR/MEDQ  D:  12/01/2006  T:  12/01/2006  Job:  [id number]

## 2010-06-03 NOTE — Group Therapy Note (Signed)
[name], [name] NO.:  [id number]   MEDICAL RECORD NO.:  [id number]          PATIENT TYPE:  WOC   LOCATION:  [facility]                   FACILITY:  [facility]   PHYSICIAN:  Johnella Moloney, MD        DATE OF BIRTH:  Nov 07, 1955   DATE OF SERVICE:                                  CLINIC NOTE   CHIEF COMPLAINT:  Abnormal vaginal bleeding.   HISTORY OF PRESENT ILLNESS:  The patient is a 55 year old G2, P1, 0-1-1  who presents for evaluation of heavy vaginal bleeding. The patient  reported having menarche at age 49 and her periods were lasting 7-8 days  and every month associated with dysmenorrhea. She reported that her  periods continued to be regular and had moderate flow until about one  year ago when she started noticing that she was getting severe cramping  and very heavy bleeding, bleeding through multiple pads a day. She was  also noting that her periods were lasting longer from 8-10 days as  opposed to her regular 7-8 days. She was told that she had possibly a  history of fibroids but she had no imaging to confirm that. Last month,  she was placed on oral contraceptive pills by her primary care Qunicy Higinbotham  and this was placed after her last menstrual period. Since then she has  had no further bleeding. The patient was referred here for further  evaluation and an endometrial biopsy.   PAST OB HISTORY:  G2, P1, 0-1-1. One C-section.   PAST GYN HISTORY:  Menstrual history as above. Normal Pap smears, last  one in June, 2008. Normal mammogram in 07/20/2006.   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 Her doctor is Dr. Marguerita Merles. The  patient also has a history of asthma, history of pneumonia,  hypercholesteremia, hypertension and thyroid problems.   PAST SURGICAL HISTORY:  Patient has had operations on her right foot and  left foot.   MEDICATIONS:  Loestrin 24 with iron one p.o. daily, iron sulfate  325 mg  daily, Zoloft 50 mg one p.o. daily, Enerzip 25 mg one p.o. daily.   ALLERGIES:  CODEINE, TOMATOES AND LATEX.   PHYSICAL EXAMINATION:  Temperature 97, pulse 61, blood pressure 111/78,  weight 69.8 kgs and height 5 2. GENERAL: In no apparent distress.  LUNGS: Clear to auscultation bilaterally.  HEART: Regular rate and rhythm.  ABDOMEN: Soft, nontender, nondistended.   Pelvic exam: Normal external female genitalia. No vaginal lesions noted.  No vaginal discharge. The patient was noted to have a speculum  examination. It was tough finding her cervix. A longer speculum had to  be used and her cervix was noted to be deviated to the left. The patient  was talked to about getting an endometrial biopsy for evaluation and  informed consent was obtained prior to her pelvic examination. The  cervix was then swabbed with Betadine and stabilized with a tenaculum, a  3 mm __________ was introduced into the endometrial cavity. The uterine  cavity founded to 10 centimeters. One pass was made, a moderate  amount  of tissue was collected to be sent to pathology for further analysis. A  small amount of bleeding noted after the procedure.   RESULTS:  The patient had a CBC that was done in July remarkable for a  hematocrit of 24.5 and a hemoglobin of 7.   ASSESSMENT/PLAN:  The patient is a 55 year old G2, P1 with menorrhagia.  The patient had a hematocrit of 24.5 and was started on iron pills by  her primary care Kutter Schnepf. She is now status post endometrial biopsy.  Discussed further options of managing of her menorrhea including further  treatment with OCTs which she is currently on, Mirena IUD or surgical  management, however told the patient that we would have to wait for  endometrial biopsy in order to make any further decisions about  management. We will also order a CBC and a thyroid simulating hormone  level to check her blood level and rule out other sources for her  menorrhagia and we  will also check an ultrasound to look at the  structure of her uterus further evaluation. The patient to return to  clinic after her endometrial biopsy for further discussion of  management.           ______________________________  Johnella Moloney, MD     UD/MEDQ  D:  09/16/2006  T:  09/17/2006  Job:  [id number]

## 2010-06-03 NOTE — Consult Note (Signed)
[name], [name]             ACCOUNT NO.:  [id number]   MEDICAL RECORD NO.:  [id number]          PATIENT TYPE:  INP   LOCATION:  9305                          [facility]ILITY:  [facility]   PHYSICIAN:  Anselm Pancoast. Weatherly, M.D.DATE OF BIRTH:  [birth year]   DATE OF CONSULTATION:  11/19/2006  DATE OF DISCHARGE:                                 CONSULTATION   GENERAL SURGICAL CONSULTATION   CHIEF COMPLAINT:  Nausea and vomiting now for approximately two weeks  following open hysterectomy and repair of umbilical hernia.   HISTORY:  [name] is a 55 year old female, who underwent an  abdominal hysterectomy and bilateral salpingo-oophorectomy and repair of  an umbilical hernia by Dr. Okey Dupre and Dr. Bevely Palmer the 14th of October.  The patient was discharged about two days later doing satisfactory and  really had eaten, bowels functioned normally, etc., for approximately a  week.  She then started having kind of nausea, vomiting, and bloating,  and then was readmitted on the 29th, the abdominal x-ray showed dilated  loops of small bowel, and she was admitted and NG tube placed.  Her BUN  and creatinine were elevated and they have been normal during her  previous hospitalization, and she had a large amount of NG drainage.  She was hydrated with IV fluids and then the following day a repeat x-  ray was obtained, which showed really no significant improvement.  I was  asked to see her yesterday, the 30th, and I ran over and saw her prior  to going to operating room at [facility], and she was not acutely ill,  her incision appeared to be healing nicely, and I could not feel any  evidence of any weakness at the umbilicus or any evidence of what looked  like infection.  I reviewed the plain x-rays and thought that a CT would  be the most beneficial way to evaluate this as she is really with the  bowels working, and now two weeks after surgery I do not think this  could be interpreted as a  postoperative ileus, but I fear that she has a  mechanical small bowel obstruction.  The CT was performed and I reviewed  that when it was done, and I questioned whether there was possibly a  weakness at the umbilicus.  The radiologists feel that the patient  coughed right at the time she was being scanned through the umbilicus,  and I have shown it to several of my partners and they are also in  agreement that they do not see evidence of hernia right at the  umbilicus.  Radiology is of the opinion that the transition is probably  in the pelvis, there is fluid in the pelvis sort of but not anything  that looks like frank abscess.  She has a moderate amount of stool in  her colon, I think she has had one enema since she has been admitted  here, and today on laboratory studies her BUN is now down to 9, glucose  of 113, her potassium is down to 2.9.  She is receiving KCL in every  other  IV and lactated Ringer's on the others, which have 4 mg of  potassium.  Her white blood count is 13,300 and hematocrit of 37.7.  I  talked with Dr. Okey Dupre that I would be of the opinion that this is more  likely a mechanical small bowel obstruction than an ileus.  She  certainly does not appear to be toxic or any definite urgency, but I  cannot see where she has really made any progress as far as bowel  function in the two days now that she has been hospitalized.  We are  going to increase the potassium and continue with the IV fluids.  I  encouraged the patient to be up ambulating, walking, sitting at the  bedside, and try to decrease her pain medications as much as possible,  and hopefully we can stimulate the bowels to begin functioning and avoid  the need of a repeat laparotomy.  If there is no definite improvement  over the next 24 hours, I feel she will definitely need a laparotomy.   I talked to Dr. Okey Dupre on whether they desire that we actually, since this  is medical obstruction and need to know that  they would prefer that if  we would be available and that he and his partner would plan on  operating on her, and I think they have tentatively scheduled surgery  for Saturday morning.  I am not going to be available to assist on  Saturday morning, but I will notify one of my partners that if their  help is needed that we are involved or at least I have seen the patient  preoperatively.  The patient was wondering if I could be present and I  have told her that I am  just not available to be here tomorrow since I am off record tomorrow,  and she said she understood.  Hopefully she will start having some bowel  function.  I think they are going to try an enema this evening in which  I would be in agreement with and hopefully the need of a repeat  laparotomy is not needed.           ______________________________  Anselm Pancoast. Zachery Dakins, M.D.     WJW/MEDQ  D:  11/19/2006  T:  11/20/2006  Job:  [id number]

## 2010-06-06 NOTE — H&P (Signed)
[name], [name] NO.:  [id number]   MEDICAL RECORD NO.:  [id number]          PATIENT TYPE:  IPS   LOCATION:  0401                          [facility]   PHYSICIAN:  Anselm Jungling, MD  DATE OF BIRTH:  21-Jun-1955   DATE OF ADMISSION:  08/07/2004  DATE OF DISCHARGE:  08/12/2004                         PSYCHIATRIC ADMISSION ASSESSMENT   IDENTIFYING INFORMATION:  This is a 55 year old single African-American  female voluntarily admitted on August 07, 2004.   HISTORY OF PRESENT ILLNESS:  The patient presents with a history of  decreased functioning.  She is unable to cope with her multiple stressors,  especially her finances and ongoing conflict with her 43 year old daughter.  She also has been having chronic foot problems.  Currently, the father of  her child has attempted suicide and this person apparently drank fluid oil  and is currently again hospitalized at this time, which is also an  additional stressor for her.  The patient did report that she did have an  argument with the daughter prior to the visit with her primary care  [name].   PAST PSYCHIATRIC HISTORY:  First admission to [facility].   SOCIAL HISTORY:  This is a 55 year old single African-American female.  Had  an 70 year old child.  Lives with her daughter.  Unemployed.   FAMILY HISTORY:  None.   ALCOHOL/DRUG HISTORY:  The patient denies any alcohol or drug use.  Nonsmoker.   PRIMARY CARE PHYSICIAN:  [facility].  Sees Dr. __________.  Also her neurologist is Dr. Orlin Hilding.   MEDICAL PROBLEMS:  Hypertension and chronic foot problems.   MEDICATIONS:  Zoloft 100 mg, Vasotec 10/25.  Zoloft is currently prescribed  by Dr. __________.   ALLERGIES:  CODEINE.   PHYSICAL EXAMINATION:  The patient was assessed at [facility].  This is a middle-aged female in no acute distress.  The patient  is using a cane for her ambulation.  Otherwise  appears in no acute distress.  Her temperature is 97.9, pulse 120, blood pressure is elevated at 180/100.  The patient is 5 feet 2 inches tall.  She is 184 pounds.   LABORATORY DATA:  Her TSH is 1.059.   MENTAL STATUS EXAM:  She is overweight, cooperative.  Good eye contact.  Pleasant.  Speech is clear.  Normal pace and tone.  The patient feels  depressed, although patient's affect is bright and smiling.  Thought  processes are coherent.  There was no evidence of psychosis.  Cognitive  function intact.  Memory is good.  Judgment and insight are fair.   DIAGNOSES:   AXIS I:  Depressive disorder not otherwise specified.   AXIS II:  Deferred.   AXIS III:  1.  Hypertension.  2.  Foot problems.   AXIS IV:  Problems with primary support group, economic issues, other  psychosocial problems, medical problems and access to health care services.   AXIS V:  Current 35-40; past year 58.   PLAN:  To stabilize mood and thinking.  Contract for safety.  Will resume  her medications.  Will resume  the Zoloft with no substitutions.  Will have a  family session with daughter for support.   TENTATIVE LENGTH OF STAY:  Four to five days.       JO/MEDQ  D:  08/12/2004  T:  08/12/2004  Job:  [id number]

## 2010-06-06 NOTE — Discharge Summary (Signed)
[name], [name]             ACCOUNT NO.:  [id number]   MEDICAL RECORD NO.:  [id number]          PATIENT TYPE:  IPS   LOCATION:  0401                          [facility]ILITY[facility]   PHYSICIAN:  Anselm Jungling, MD  DATE OF BIRTH:  February 11, 1955   DATE OF ADMISSION:  08/07/2004  DATE OF DISCHARGE:  08/12/2004                                 DISCHARGE SUMMARY   IDENTIFYING DATA AND REASON FOR ADMISSION:  The patient is a 55 year old  African-American female who is admitted to the psychiatric inpatient service  due to inability to contract for safety.  She was overwhelmed and felt that  she could not cope with severe stressors which included financial problems  and conflicts with her daughter.  Furthermore, the father of her daughter  had attempted suicide and was hospitalized, gravely ill.   PRESENTING AND PAST HISTORY:  The patient had an argument with her daughter  just prior to admission.  The patient was not involved with using illicit  drugs or alcohol.   MEDICATIONS:  None.   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  AXIS II:  Deferred.  AXIS III:  History of hypertension.  Foot disorder.  AXIS IV:  Stressors severe.  AXIS V:  Global assessment of function on admission 30.   MEDICAL AND LABORATORY:  The patient was physically evaluated at [facility] Emergency Department.  The patient had some form of contracture of  her right foot, which she was due to have evaluated and treated at [facility].  She was given quinine p.r.n. muscle  cramps for this, and p.r.n. Flexeril.   HOSPITAL COURSE:  The patient was admitted to the adult inpatient service  where she participated in various therapeutic groups, activities and classes  designed to help her acquire better coping skills, a better understanding of  her underlying disorders and dynamics, and the development of an  aftercare  plan.  She was a reasonably good participant in milieu activities.  She was  started on a trial of Zoloft 100 mg daily as well as Seroquel 100 mg q.h.s.  to help stabilize sleep.  These were well tolerated.  Seroquel was  subsequently increased step wise to 200 mg, then 300 mg q.h.s.   This was a 6-day inpatient stay.  Just prior to discharge, a family session  was held with the inpatient counselor involving the patient's daughter.  This was productive.  The patient felt ready for discharge at that time.  Her mood was improved, and she had no thoughts of suicide or self-harm.   AFTERCARE:  The patient was to follow up with Dr. Wynonia Lawman, psychiatrist, to  be arranged at the time of discharge, and was to see Elita Quick on August 21, 2004 at 1:00 p.m. at the [facility] [place].  She was to  follow up at [facility] on August 14, 2004 for her  foot problem.   DISCHARGE MEDICATIONS:  1.  Zoloft 100 mg p.o. daily.  2.  Seroquel 300 mg p.o. q.h.s.  3.  Vasotec 10 mg daily.  4.  Hydrochlorothiazide 25 mg p.o. daily.   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  AXIS II:  Deferred.  AXIS III:  History of hypertension.  Foot problem.  AXIS IV:  Stressors severe.  AXIS V:  Global assessment of function on discharge 55.           ______________________________  Anselm Jungling, MD  Electronically Signed     SPB/MEDQ  D:  08/29/2004  T:  08/29/2004  Job:  [id number]

## 2010-10-28 LAB — BODY FLUID CULTURE
Culture: NO GROWTH
Culture: NO GROWTH
Gram Stain: NONE SEEN
Gram Stain: NONE SEEN

## 2010-10-28 LAB — CBC
HCT: 30.9 — ABNORMAL LOW
Hemoglobin: 10.4 — ABNORMAL LOW
MCHC: 33
MCHC: 33.1
MCHC: 33.6
MCHC: 33.7
MCHC: 34
MCV: 80
MCV: 80.5
MCV: 80.5
MCV: 81.2
Platelets: 382
Platelets: 463 — ABNORMAL HIGH
RBC: 3.78 — ABNORMAL LOW
RBC: 4.1
RBC: 4.11
RBC: 4.3
RDW: 18.2 — ABNORMAL HIGH
WBC: 12.4 — ABNORMAL HIGH
WBC: 12.9 — ABNORMAL HIGH
WBC: 8.6
WBC: 9.4

## 2010-10-28 LAB — COMPREHENSIVE METABOLIC PANEL
ALT: 132 — ABNORMAL HIGH
ALT: 17
ALT: 18
ALT: 21
ALT: 53 — ABNORMAL HIGH
ALT: 64 — ABNORMAL HIGH
AST: 13
AST: 15
AST: 22
AST: 43 — ABNORMAL HIGH
AST: 59 — ABNORMAL HIGH
Albumin: 2.4 — ABNORMAL LOW
Albumin: 2.6 — ABNORMAL LOW
Albumin: 2.6 — ABNORMAL LOW
Albumin: 2.7 — ABNORMAL LOW
Alkaline Phosphatase: 141 — ABNORMAL HIGH
Alkaline Phosphatase: 84
Alkaline Phosphatase: 86
BUN: 12
BUN: 13
BUN: 4 — ABNORMAL LOW
CO2: 23
CO2: 25
CO2: 26
CO2: 27
CO2: 30
Calcium: 8.7
Calcium: 8.9
Calcium: 9
Calcium: 9.3
Calcium: 9.6
Chloride: 104
Chloride: 107
Chloride: 108
Chloride: 108
Chloride: 116 — ABNORMAL HIGH
Creatinine, Ser: 0.71
Creatinine, Ser: 0.76
Creatinine, Ser: 0.82
GFR calc Af Amer: >60
GFR calc Af Amer: >60
GFR calc Af Amer: >60
GFR calc Af Amer: >60
GFR calc Af Amer: >60
GFR calc non Af Amer: >60
GFR calc non Af Amer: >60
GFR calc non Af Amer: >60
GFR calc non Af Amer: >60
GFR calc non Af Amer: >60
Glucose, Bld: 103 — ABNORMAL HIGH
Glucose, Bld: 112 — ABNORMAL HIGH
Glucose, Bld: 79
Glucose, Bld: 95
Potassium: 3.6
Potassium: 3.7
Potassium: 3.9
Sodium: 135
Sodium: 139
Sodium: 140
Sodium: 146 — ABNORMAL HIGH
Total Bilirubin: 0.2 — ABNORMAL LOW
Total Bilirubin: 0.3
Total Bilirubin: 0.3
Total Protein: 6.3

## 2010-10-28 LAB — MAGNESIUM
Magnesium: 1.6
Magnesium: 1.8
Magnesium: 1.8
Magnesium: 2.1

## 2010-10-28 LAB — DIFFERENTIAL
Basophils Absolute: 0
Basophils Absolute: 0.1
Basophils Relative: 0
Basophils Relative: 1
Eosinophils Absolute: 0.2
Eosinophils Absolute: 0.2
Eosinophils Relative: 2
Eosinophils Relative: 2
Lymphocytes Relative: 15
Lymphs Abs: 1.1
Lymphs Abs: 1.2
Monocytes Absolute: 0.7
Monocytes Relative: 4
Neutro Abs: 10.3 — ABNORMAL HIGH
Neutro Abs: 5.8
Neutrophils Relative %: 74
Neutrophils Relative %: 84 — ABNORMAL HIGH

## 2010-10-28 LAB — BASIC METABOLIC PANEL
BUN: 11
BUN: 9
CO2: 24
Calcium: 8.8
Creatinine, Ser: 0.71
GFR calc Af Amer: >60
Glucose, Bld: 91
Sodium: 132 — ABNORMAL LOW

## 2010-10-28 LAB — PHOSPHORUS
Phosphorus: 3.8
Phosphorus: 4.9 — ABNORMAL HIGH

## 2010-10-28 LAB — TRIGLYCERIDES: Triglycerides: 67

## 2010-10-28 LAB — PREALBUMIN: Prealbumin: 22.6

## 2010-10-28 LAB — CHOLESTEROL, TOTAL: Cholesterol: 65

## 2010-10-29 LAB — CBC
HCT: 20.9 — ABNORMAL LOW
HCT: 22.4 — ABNORMAL LOW
HCT: 34.7 — ABNORMAL LOW
Hemoglobin: 11.8 — ABNORMAL LOW
Hemoglobin: 6.9 — CL
Hemoglobin: 7.7 — CL
MCHC: 33
MCHC: 33.2
MCHC: 33.6
MCHC: 34.1
MCV: 80.3
MCV: 80.3
Platelets: 203
Platelets: 242
RBC: 2.84 — ABNORMAL LOW
RBC: 4.32
RBC: 5.28 — ABNORMAL HIGH
RDW: 19.3 — ABNORMAL HIGH
RDW: 21.6 — ABNORMAL HIGH
RDW: 22.6 — ABNORMAL HIGH
RDW: 22.9 — ABNORMAL HIGH
WBC: 8.6

## 2010-10-29 LAB — COMPREHENSIVE METABOLIC PANEL
AST: 18
CO2: 24
Calcium: 10.2
Creatinine, Ser: 2.33 — ABNORMAL HIGH
GFR calc Af Amer: 27 — ABNORMAL LOW
GFR calc non Af Amer: 22 — ABNORMAL LOW
Glucose, Bld: 127 — ABNORMAL HIGH
Total Protein: 9 — ABNORMAL HIGH

## 2010-10-29 LAB — BASIC METABOLIC PANEL
CO2: 32
Calcium: 8.5
Chloride: 100
Chloride: 97
GFR calc Af Amer: 43 — ABNORMAL LOW
GFR calc Af Amer: >60
GFR calc Af Amer: >60
GFR calc non Af Amer: >60
Glucose, Bld: 88
Potassium: 3.4 — ABNORMAL LOW
Potassium: 3.7
Sodium: 138
Sodium: 142

## 2010-10-29 LAB — CROSSMATCH: Antibody Screen: NEGATIVE

## 2010-10-30 LAB — CBC
HCT: 28.8 — ABNORMAL LOW
HCT: 33.9 — ABNORMAL LOW
MCHC: 33.1
MCV: 76.8 — ABNORMAL LOW
Platelets: 278
RBC: 4.42
RDW: 23.5 — ABNORMAL HIGH
WBC: 12 — ABNORMAL HIGH
WBC: 5.8

## 2010-10-30 LAB — CROSSMATCH
ABO/RH(D): A POS
Antibody Screen: NEGATIVE

## 2010-10-30 LAB — COMPREHENSIVE METABOLIC PANEL
BUN: 7
CO2: 26
Calcium: 9.5
Chloride: 110
Creatinine, Ser: 0.82
GFR calc Af Amer: >60
GFR calc non Af Amer: >60
Glucose, Bld: 78
Total Bilirubin: 0.5

## 2010-10-30 LAB — ABO/RH: ABO/RH(D): A POS

## 2013-08-24 ENCOUNTER — Emergency Department (HOSPITAL_COMMUNITY): Payer: Self-pay

## 2013-08-24 ENCOUNTER — Inpatient Hospital Stay (HOSPITAL_COMMUNITY)
Admission: EM | Admit: 2013-08-24 | Discharge: 2013-08-25 | DRG: 305 | Disposition: A | Discharge status: Home / Self Care | Payer: Self-pay | Attending: Internal Medicine | Admitting: Internal Medicine

## 2013-08-24 ENCOUNTER — Inpatient Hospital Stay (HOSPITAL_COMMUNITY): Payer: Self-pay

## 2013-08-24 ENCOUNTER — Encounter (HOSPITAL_COMMUNITY): Payer: Self-pay | Admitting: Emergency Medicine

## 2013-08-24 DIAGNOSIS — L658 Other specified nonscarring hair loss: Secondary | ICD-10-CM

## 2013-08-24 DIAGNOSIS — R569 Unspecified convulsions: Secondary | ICD-10-CM | POA: Diagnosis present

## 2013-08-24 DIAGNOSIS — Z91199 Patient's noncompliance with other medical treatment and regimen due to unspecified reason: Secondary | ICD-10-CM

## 2013-08-24 DIAGNOSIS — R404 Transient alteration of awareness: Secondary | ICD-10-CM | POA: Diagnosis present

## 2013-08-24 DIAGNOSIS — E8941 Symptomatic postprocedural ovarian failure: Secondary | ICD-10-CM

## 2013-08-24 DIAGNOSIS — L708 Other acne: Secondary | ICD-10-CM

## 2013-08-24 DIAGNOSIS — R259 Unspecified abnormal involuntary movements: Secondary | ICD-10-CM

## 2013-08-24 DIAGNOSIS — Z9119 Patient's noncompliance with other medical treatment and regimen: Secondary | ICD-10-CM

## 2013-08-24 DIAGNOSIS — I160 Hypertensive urgency: Secondary | ICD-10-CM | POA: Diagnosis present

## 2013-08-24 DIAGNOSIS — I10 Essential (primary) hypertension: Principal | ICD-10-CM | POA: Diagnosis present

## 2013-08-24 DIAGNOSIS — Z79899 Other long term (current) drug therapy: Secondary | ICD-10-CM

## 2013-08-24 DIAGNOSIS — F339 Major depressive disorder, recurrent, unspecified: Secondary | ICD-10-CM

## 2013-08-24 DIAGNOSIS — E669 Obesity, unspecified: Secondary | ICD-10-CM

## 2013-08-24 DIAGNOSIS — Z91018 Allergy to other foods: Secondary | ICD-10-CM

## 2013-08-24 DIAGNOSIS — L408 Other psoriasis: Secondary | ICD-10-CM

## 2013-08-24 DIAGNOSIS — M2140 Flat foot [pes planus] (acquired), unspecified foot: Secondary | ICD-10-CM

## 2013-08-24 DIAGNOSIS — R32 Unspecified urinary incontinence: Secondary | ICD-10-CM | POA: Diagnosis present

## 2013-08-24 DIAGNOSIS — Z886 Allergy status to analgesic agent status: Secondary | ICD-10-CM

## 2013-08-24 HISTORY — DX: Unspecified convulsions: R56.9

## 2013-08-24 HISTORY — DX: Carpal tunnel syndrome, unspecified upper limb: G56.00

## 2013-08-24 HISTORY — DX: Essential (primary) hypertension: I10

## 2013-08-24 LAB — URINE MICROSCOPIC-ADD ON

## 2013-08-24 LAB — URINALYSIS, ROUTINE W REFLEX MICROSCOPIC
BILIRUBIN URINE: NEGATIVE
GLUCOSE, UA: NEGATIVE mg/dL
Ketones, ur: NEGATIVE mg/dL
Leukocytes, UA: NEGATIVE
Nitrite: NEGATIVE
PH: 6.5 (ref 5.0–8.0)
Protein, ur: NEGATIVE mg/dL
SPECIFIC GRAVITY, URINE: 1.005 (ref 1.005–1.030)
UROBILINOGEN UA: 0.2 mg/dL (ref 0.0–1.0)

## 2013-08-24 LAB — CBC
HEMATOCRIT: 39.5 % (ref 36.0–46.0)
Hemoglobin: 12.9 g/dL (ref 12.0–15.0)
MCH: 26.7 pg (ref 26.0–34.0)
MCHC: 32.7 g/dL (ref 30.0–36.0)
MCV: 81.6 fL (ref 78.0–100.0)
PLATELETS: 256 10*3/uL (ref 150–400)
RBC: 4.84 MIL/uL (ref 3.87–5.11)
RDW: 15.2 % (ref 11.5–15.5)
WBC: 7.1 10*3/uL (ref 4.0–10.5)

## 2013-08-24 LAB — BASIC METABOLIC PANEL
ANION GAP: 11 (ref 5–15)
BUN: 21 mg/dL (ref 6–23)
CALCIUM: 9.4 mg/dL (ref 8.4–10.5)
CO2: 25 meq/L (ref 19–32)
CREATININE: 1.01 mg/dL (ref 0.50–1.10)
Chloride: 108 mEq/L (ref 96–112)
GFR calc Af Amer: 71 mL/min — ABNORMAL LOW (ref >90)
GFR calc non Af Amer: 61 mL/min — ABNORMAL LOW (ref >90)
Glucose, Bld: 89 mg/dL (ref 70–99)
Potassium: 4.1 mEq/L (ref 3.7–5.3)
Sodium: 144 mEq/L (ref 137–147)

## 2013-08-24 LAB — I-STAT TROPONIN, ED: TROPONIN I, POC: 0 ng/mL (ref 0.00–0.08)

## 2013-08-24 LAB — CBG MONITORING, ED: Glucose-Capillary: 91 mg/dL (ref 70–99)

## 2013-08-24 MED ORDER — LORAZEPAM 2 MG/ML IJ SOLN
1.0000 mg | Freq: Once | INTRAMUSCULAR | Status: AC
  Administered 2013-08-24: 1 mg via INTRAVENOUS
  Filled 2013-08-24: qty 1

## 2013-08-24 MED ORDER — LABETALOL HCL 5 MG/ML IV SOLN
10.0000 mg | Freq: Once | INTRAVENOUS | Status: DC
  Filled 2013-08-24: qty 4

## 2013-08-24 MED ORDER — HYDRALAZINE HCL 20 MG/ML IJ SOLN
10.0000 mg | Freq: Once | INTRAMUSCULAR | Status: AC
  Administered 2013-08-24: 10 mg via INTRAVENOUS
  Filled 2013-08-24: qty 1

## 2013-08-24 MED ORDER — SODIUM CHLORIDE 0.9 % IJ SOLN
3.0000 mL | Freq: Two times a day (BID) | INTRAMUSCULAR | Status: DC
  Administered 2013-08-24: 3 mL via INTRAVENOUS

## 2013-08-24 MED ORDER — HYDROCHLOROTHIAZIDE 25 MG PO TABS
25.0000 mg | ORAL_TABLET | Freq: Every day | ORAL | Status: DC
  Administered 2013-08-24 – 2013-08-25 (×2): 25 mg via ORAL
  Filled 2013-08-24 (×2): qty 1

## 2013-08-24 MED ORDER — HEPARIN SODIUM (PORCINE) 5000 UNIT/ML IJ SOLN
5000.0000 [IU] | Freq: Three times a day (TID) | INTRAMUSCULAR | Status: DC
  Administered 2013-08-24 – 2013-08-25 (×3): 5000 [IU] via SUBCUTANEOUS
  Filled 2013-08-24 (×3): qty 1

## 2013-08-24 MED ORDER — LISINOPRIL 10 MG PO TABS
10.0000 mg | ORAL_TABLET | Freq: Once | ORAL | Status: AC
  Administered 2013-08-24: 10 mg via ORAL
  Filled 2013-08-24: qty 1

## 2013-08-24 MED ORDER — LEVETIRACETAM IN NACL 1000 MG/100ML IV SOLN
1000.0000 mg | Freq: Once | INTRAVENOUS | Status: AC
  Administered 2013-08-24: 1000 mg via INTRAVENOUS
  Filled 2013-08-24: qty 100

## 2013-08-24 MED ORDER — HYDRALAZINE HCL 20 MG/ML IJ SOLN
10.0000 mg | INTRAMUSCULAR | Status: DC | PRN
  Administered 2013-08-25 (×2): 10 mg via INTRAVENOUS
  Filled 2013-08-24 (×2): qty 1

## 2013-08-24 NOTE — Consult Note (Addendum)
Reason for Consult: Recurrent episodes of loss of consciousness.  HPI:                                                                                                                                          [name] is an 58 y.o. female with a history of hypertension who presented to the emergency room with a complaint of recurrent spells of loss of consciousness since 2006 with a recent increase in frequency of spells. She's had about 10 such spells with the last 4-6 weeks, the last of which was earlier this morning. There is typically an aura consisting of bilateral numbness and tingling as well as feeling of discomfort involving upper and lower extremities. Period of unconsciousness is variable. She's been told that she has convulsions involving her legs. She's also had episodes of urinary incontinence. She's had soreness of her tongue but it is unclear whether she's actually had bruises or lacerations. She reportedly is confused when she wakes up. 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 No acute findings were noted. Blood pressure in the emergency room at presentation was markedly elevated at 251/113. She was given hydralazine and hydrochlorothiazide with no significant change so far.   Past Medical History  Diagnosis Date  . Hypertension   . Carpal tunnel syndrome     Past Surgical History  Procedure Laterality Date  . Abdominal hysterectomy    . Hernia repair      No family history on file.  Social History:  reports that she has never smoked. She does not have any smokeless tobacco history on file. She reports that she does not drink alcohol or use illicit drugs.  Allergies  Allergen Reactions  . Codeine Anaphylaxis  . Tomato Itching and Rash    MEDICATIONS:                                                                                                                     I have reviewed the patient's current medications.   ROS:  History obtained from the patient  General ROS: negative for - chills, fatigue, fever, night sweats, weight gain or weight loss Psychological ROS: negative for - behavioral disorder, hallucinations, memory difficulties, mood swings or suicidal ideation Ophthalmic ROS: negative for - blurry vision, double vision, eye pain or loss of vision ENT ROS: negative for - epistaxis, nasal discharge, oral lesions, sore throat, tinnitus or vertigo Allergy and Immunology ROS: negative for - hives or itchy/watery eyes Hematological and Lymphatic ROS: negative for - bleeding problems, bruising or swollen lymph nodes Endocrine ROS: negative for - galactorrhea, hair pattern changes, polydipsia/polyuria or temperature intolerance Respiratory ROS: negative for - cough, hemoptysis, shortness of breath or wheezing Cardiovascular ROS: negative for - chest pain, dyspnea on exertion, edema or irregular heartbeat Gastrointestinal ROS: negative for - abdominal pain, diarrhea, hematemesis, nausea/vomiting or stool incontinence Genito-Urinary ROS: negative for - dysuria, hematuria, incontinence or urinary frequency/urgency Musculoskeletal ROS: negative for - joint swelling or muscular weakness Neurological ROS: as noted in HPI Dermatological ROS: negative for rash and skin lesion changes   Blood pressure 237/109, pulse 68, temperature 98.2 F (36.8 C), temperature source Oral, resp. rate 13, height 5' 2"  (1.575 m), SpO2 99.00%.   Neurologic Examination:                                                                                                      Mental Status: Alert, oriented, thought content appropriate.  Speech fluent without evidence of aphasia. Able to follow commands without difficulty. Cranial Nerves: II-Visual fields were normal. III/IV/VI-Pupils were equal and reacted. Extraocular  movements were full and conjugate.    V/VII-no facial numbness and no facial weakness. VIII-normal. X-normal speech and symmetrical palatal movement. Motor: 5/5 bilaterally with normal tone and bulk Sensory: Normal throughout. Deep Tendon Reflexes: 2+, brisk and symmetric. Plantars: Flexor bilaterally Cerebellar: Normal finger-to-nose testing.  Lab Results  Component Value Date/Time   CHOL  Value: 60        ATP III CLASSIFICATION:  <200     mg/dL   Desirable  200-239  mg/dL   Borderline High  >=240    mg/dL   High 11/29/2006  8:09 AM    Results for orders placed during the hospital encounter of 08/24/13 (from the past 48 hour(s))  CBC     Status: None   Collection Time    08/24/13  3:37 PM      Result Value Ref Range   WBC 7.1  4.0 - 10.5 K/uL   RBC 4.84  3.87 - 5.11 MIL/uL   Hemoglobin 12.9  12.0 - 15.0 g/dL   HCT 39.5  36.0 - 46.0 %   MCV 81.6  78.0 - 100.0 fL   MCH 26.7  26.0 - 34.0 pg   MCHC 32.7  30.0 - 36.0 g/dL   RDW 15.2  11.5 - 15.5 %   Platelets 256  150 - 400 K/uL  BASIC METABOLIC PANEL     Status: Abnormal   Collection Time    08/24/13  3:37 PM      Result Value Ref Range  Sodium 144  137 - 147 mEq/L   Potassium 4.1  3.7 - 5.3 mEq/L   Chloride 108  96 - 112 mEq/L   CO2 25  19 - 32 mEq/L   Glucose, Bld 89  70 - 99 mg/dL   BUN 21  6 - 23 mg/dL   Creatinine, Ser 1.01  0.50 - 1.10 mg/dL   Calcium 9.4  8.4 - 10.5 mg/dL   GFR calc non Af Amer 61 (*) >90 mL/min   GFR calc Af Amer 71 (*) >90 mL/min   Comment: (NOTE)     The eGFR has been calculated using the CKD EPI equation.     This calculation has not been validated in all clinical situations.     eGFR's persistently <90 mL/min signify possible Chronic Kidney     Disease.   Anion gap 11  5 - 15  URINALYSIS, ROUTINE W REFLEX MICROSCOPIC     Status: Abnormal   Collection Time    08/24/13  5:39 PM      Result Value Ref Range   Color, Urine YELLOW  YELLOW   APPearance CLEAR  CLEAR   Specific Gravity, Urine  1.005  1.005 - 1.030   pH 6.5  5.0 - 8.0   Glucose, UA NEGATIVE  NEGATIVE mg/dL   Hgb urine dipstick SMALL (*) NEGATIVE   Bilirubin Urine NEGATIVE  NEGATIVE   Ketones, ur NEGATIVE  NEGATIVE mg/dL   Protein, ur NEGATIVE  NEGATIVE mg/dL   Urobilinogen, UA 0.2  0.0 - 1.0 mg/dL   Nitrite NEGATIVE  NEGATIVE   Leukocytes, UA NEGATIVE  NEGATIVE  URINE MICROSCOPIC-ADD ON     Status: None   Collection Time    08/24/13  5:39 PM      Result Value Ref Range   Squamous Epithelial / LPF RARE  RARE   RBC / HPF 0-2  <3 RBC/hpf   Bacteria, UA RARE  RARE  I-STAT TROPOININ, ED     Status: None   Collection Time    08/24/13  6:08 PM      Result Value Ref Range   Troponin i, poc 0.00  0.00 - 0.08 ng/mL   Comment 3            Comment: Due to the release kinetics of cTnI,     a negative result within the first hours     of the onset of symptoms does not rule out     myocardial infarction with certainty.     If myocardial infarction is still suspected,     repeat the test at appropriate intervals.    Dg Chest 2 View  08/24/2013   CLINICAL DATA:  Pain, syncopal episode  EXAM: CHEST  2 VIEW  COMPARISON:  Chest x-ray of 11/20/2006  FINDINGS: No active infiltrate or effusion is seen. Mediastinal and hilar contours appear unchanged. Mild cardiomegaly is stable. No acute bony abnormality is seen.  IMPRESSION: Stable mild cardiomegaly.  No active lung disease.   Electronically Signed   By: Ivar Drape M.D.   On: 08/24/2013 17:01   Ct Head Wo Contrast  08/24/2013   CLINICAL DATA:  Blackout spells for 1 week. Numbness and tingling in the arms and legs. No neuro deficits.  EXAM: CT HEAD WITHOUT CONTRAST  TECHNIQUE: Contiguous axial images were obtained from the base of the skull through the vertex without contrast.  COMPARISON:  None  FINDINGS: No evidence for acute infarction, hemorrhage, mass lesion, hydrocephalus, or extra-axial fluid.  BILATERAL abnormal prominence of the parasagittal parietal sulci appear to be  related to incidental biparietal foramina, a congenital anomaly. Biparietal foramina could also be associated with cortical venous drainage anomalies. These are non acute findings.  Partial empty sella. Calvarium otherwise intact. Sinuses and mastoids clear.  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 No acute intracranial abnormality is detected.   Electronically Signed   By: Rolla Flatten M.D.   On: 08/24/2013 17:16    Assessment/Plan: 58 year old lady presenting with hypertensive urgency as well as history of recurrent spells, the descriptions of which are consistent with partial seizures with secondary generalization. Significance of biparietal likely congenital abnormalities is unclear but may be potential epileptogenic foci, given that her seizure onset is sensory in nature.  Recommendations: 1. MRI of the brain without and with contrast media. 2. EEG, routine adult study. 3. Keppra 1000 mg IV loading dose followed by 500 mg twice a day 4. Management of hypertensive urgency per ED physician and primary care admitting team.  We will continue to follow this patient with you.  C.R. Nicole Kindred, MD [facility] [phone]  08/24/2013, 7:30 PM

## 2013-08-24 NOTE — ED Notes (Signed)
Resident at the bedside

## 2013-08-24 NOTE — ED Notes (Signed)
Neurologist at the bedside 

## 2013-08-24 NOTE — ED Notes (Signed)
MD at the bedside  

## 2013-08-24 NOTE — ED Provider Notes (Signed)
CSN: [id number]     Arrival date & time 08/24/13  1501 History   First MD Initiated Contact with Patient 08/24/13 1548     Chief Complaint  Patient presents with  . Loss of Consciousness    HPI  [name] is a 58 yo F with h/o HTN who presents with a more than month long history of numerous episodes of "blacking out." She reports that these have been happening quite often with more than 10 episodes that she can remember, last episode last night. Before these episodes, she remembers having a feeling that it is coming on, having odd sensations throughout her body and with her heart racing. She reports that her daughter has witnessed similar episodes before, and has noticed that her legs are convulsing, having to sit on top of her legs to keep them under control. When pt wakes up, she notices bowel and bladder incontinence, along with biting of her tongue. There is a period of several hours after these episodes where she feels very tired.   Otherwise, she does endorse some episodes where she has slurred speech, but no episodes of hemiparesis.   She has HTN, but is not followed in outpatient and does not currently take any medications to manage it.   Past Medical History  Diagnosis Date  . Hypertension   . Carpal tunnel syndrome    Past Surgical History  Procedure Laterality Date  . Abdominal hysterectomy    . Hernia repair     No family history on file. History  Substance Use Topics  . Smoking status: Never Smoker   . Smokeless tobacco: Not on file  . Alcohol Use: No   OB History   Grav Para Term Preterm Abortions TAB SAB Ect Mult Living                 Review of Systems  Constitutional: Negative for fever and chills.  Respiratory: Positive for shortness of breath ( around when these episodes come on). Negative for cough, chest tightness and wheezing.   Cardiovascular: Negative for chest pain and palpitations.  Gastrointestinal: Negative for abdominal pain, diarrhea and  constipation.  Genitourinary: Negative for dysuria and hematuria.  Neurological: Positive for dizziness, seizures, syncope, speech difficulty and light-headedness. Negative for numbness and headaches.      Allergies  Codeine  Home Medications   Prior to Admission medications   Medication Sig Start Date End Date Taking? Authorizing Provider  clindamycin (CLEOCIN-T) 1 % lotion Apply topically daily. A small amount to affected area     Historical Provider, MD  enalapril-hydrochlorothiazide (VASERETIC) 10-25 MG per tablet Take 1 tablet by mouth daily.      Historical Provider, MD  fluocinolone (DERMA-SMOOTHE/FS SCALP) 0.01 % external oil Apply topically. 1 a small amount to affected area as directed     Historical Provider, MD  sertraline (ZOLOFT) 100 MG tablet Take 100 mg by mouth daily.      Historical Provider, MD  traMADol (ULTRAM) 50 MG tablet Take 50 mg by mouth 3 (three) times daily as needed. For pain     Historical Provider, MD   BP 229/126  Pulse 81  Temp(Src) 98.2 F (36.8 C) (Oral)  Resp 21  Ht 5\' 2"  (1.575 m)  SpO2 99% Physical Exam  Constitutional: She is oriented to person, place, and time. She appears well-developed and well-nourished. No distress.  HENT:  Head: Normocephalic and atraumatic.  Eyes: EOM are normal. Pupils are equal, round, and reactive to light.  Exotropia    Cardiovascular: Normal rate and regular rhythm.  Exam reveals no gallop and no friction rub.   No murmur heard. Pulmonary/Chest: Effort normal and breath sounds normal. She has no wheezes. She exhibits no tenderness.  Abdominal: Soft. Bowel sounds are normal. She exhibits no distension and no mass. There is no tenderness. There is no rebound and no guarding.  Neurological: She is alert and oriented to person, place, and time. No cranial nerve deficit.  Strength 5/5 throughout Sensation to light touch intact throughout Reflexes: 3+ brachioradialis and patellar bilaterally Cerebellar: no  dysmetria  Skin: She is not diaphoretic.    ED Course  Procedures (including critical care time) Labs Review Labs Reviewed  CBC  BASIC METABOLIC PANEL  CBG MONITORING, ED    Imaging Review No results found.   EKG Interpretation   Date/Time:  Thursday August 24 2013 15:27:37 EDT Ventricular Rate:  79 PR Interval:  158 QRS Duration: 82 QT Interval:  386 QTC Calculation: 442 R Axis:   62 Text Interpretation:  Normal sinus rhythm Normal ECG No significant change  since last tracing Confirmed by YAO  MD, DAVID ([id number]) on 08/24/2013 4:00:13  PM      MDM   Final diagnoses:  None   Patient presenting with markedly elevated pressures >200s/100s in the setting of repeated episodes of loss consciousness associated with aura, bowel/bladder incontinence, post-ictal period, witnessed convulsions, and family history of seizures, all consistent with seizures. Patient has not been evaluated by a neurologist for this before. Given pressures, concern for CVA c/b seizures. Will also assess for evidence of other end-organ damage.   - No evidence of renal, pulmonary or dysfunction on lab work on imaging.   - CT with NAICP. Evidence of congenital abnormalities of uncertain significance concerning relevance to seizures.   - Neurology consulted.  - BPs persistently elevated > 200s/100s s/p lisnopril and HCTZ, though patient noted to be orthostatic, so will hold off on IV pressure control until after further workup.   6:56 PM Pending neuro consult. Patient signed over to Dr. Silverio LayYao.         Kirsten BarbanLawrence Nkosi Cortright, MD 08/24/13 [phone]

## 2013-08-24 NOTE — ED Notes (Signed)
Pt states that she has been having "blackout spells" for approx 1 week. Pt states that prior to events she has numbness/tingling in her arms and legs. Pt denies pain at this time. No neuro deficits at triage. Alert and oriented x4

## 2013-08-24 NOTE — ED Notes (Signed)
Patient helped up to the bathroom. Patient has a steady gait.

## 2013-08-24 NOTE — H&P (Signed)
[facility] History and Physical  Kirsten RampRenee [name] ZOX:[id number] DOB: Sep 10, 1955 DOA: 08/24/2013  Referring physician: EDP PCP: No PCP Per Patient   Chief Complaint: Recurrent syncopal episodes   HPI: [name] is a 58 y.o. female h/o HTN, not seeing a doctor, presents to ED with c/o recurrent spells of LOC since 2006 with recent increase in frequency of spells.  10 such episodes in last 4-6 weeks, last of which was earlier this morning.  There is an aura consisting of bilateral numbness and tingling and feeling of discomfort in BUE and BLE prior to the spells she states.  Period of unconsciousness is variable.  She has been told she has convulsions involving her legs during these periods and is confused upon waking up.  She has also had episodes of urinary incontinence during these spells and tongue soreness (unclear if she actually bit it or not).  CT head shows a congenital abnormality of biparietal foramina.  No acute findings were noted.  BP in the ED was markedly elevated 251/113 initially and improved after hydralazine, HCTZ, and lisinopril were given now with SBP in the 170s.  Review of Systems: Systems reviewed.  As above, otherwise negative  Past Medical History  Diagnosis Date  . Hypertension   . Carpal tunnel syndrome    Past Surgical History  Procedure Laterality Date  . Abdominal hysterectomy    . Hernia repair     Social History:  reports that she has never smoked. She does not have any smokeless tobacco history on file. She reports that she does not drink alcohol or use illicit drugs.  Allergies  Allergen Reactions  . Codeine Anaphylaxis  . Tomato Itching and Rash    No family history on file.   Prior to Admission medications   Medication Sig Start Date End Date Taking? Authorizing Provider  Chilton SiGreen Tea, Camillia sinensis, (GREEN TEA PO) Take 1 tablet by mouth daily.   Yes Historical Provider, MD   Physical Exam: Filed Vitals:   08/24/13 2006  BP:  176/101  Pulse:   Temp:   Resp:     BP 176/101  Pulse 76  Temp(Src) 98.2 F (36.8 C) (Oral)  Resp 19  Ht 5\' 2"  (1.575 m)  SpO2 99%  General Appearance:    Alert, oriented, no distress, appears stated age  Head:    Normocephalic, atraumatic  Eyes:    PERRL, EOMI, sclera non-icteric        Nose:   Nares without drainage or epistaxis. Mucosa, turbinates normal  Throat:   Moist mucous membranes. Oropharynx without erythema or exudate.  Neck:   Supple. No carotid bruits.  No thyromegaly.  No lymphadenopathy.   Back:     No CVA tenderness, no spinal tenderness  Lungs:     Clear to auscultation bilaterally, without wheezes, rhonchi or rales  Chest wall:    No tenderness to palpitation  Heart:    Regular rate and rhythm without murmurs, gallops, rubs  Abdomen:     Soft, non-tender, nondistended, normal bowel sounds, no organomegaly  Genitalia:    deferred  Rectal:    deferred  Extremities:   No clubbing, cyanosis or edema.  Pulses:   2+ and symmetric all extremities  Skin:   Skin color, texture, turgor normal, no rashes or lesions  Lymph nodes:   Cervical, supraclavicular, and axillary nodes normal  Neurologic:   CNII-XII intact. Normal strength, sensation and reflexes      throughout    Labs on Admission:  Basic Metabolic Panel:  Recent Labs Lab 08/24/13 1537  NA 144  K 4.1  CL 108  CO2 25  GLUCOSE 89  BUN 21  CREATININE 1.01  CALCIUM 9.4   Liver Function Tests: No results found for this basename: AST, ALT, ALKPHOS, BILITOT, PROT, ALBUMIN,  in the last 168 hours No results found for this basename: LIPASE, AMYLASE,  in the last 168 hours No results found for this basename: AMMONIA,  in the last 168 hours CBC:  Recent Labs Lab 08/24/13 1537  WBC 7.1  HGB 12.9  HCT 39.5  MCV 81.6  PLT 256   Cardiac Enzymes: No results found for this basename: CKTOTAL, CKMB, CKMBINDEX, TROPONINI,  in the last 168 hours  BNP (last 3 results) No results found for this  basename: PROBNP,  in the last 8760 hours CBG:  Recent Labs Lab 08/24/13 1600  GLUCAP 91    Radiological Exams on Admission: Dg Chest 2 View  08/24/2013   CLINICAL DATA:  Pain, syncopal episode  EXAM: CHEST  2 VIEW  COMPARISON:  Chest x-ray of 11/20/2006  FINDINGS: No active infiltrate or effusion is seen. Mediastinal and hilar contours appear unchanged. Mild cardiomegaly is stable. No acute bony abnormality is seen.  IMPRESSION: Stable mild cardiomegaly.  No active lung disease.   Electronically Signed   By: Dwyane Dee M.D.   On: 08/24/2013 17:01   Ct Head Wo Contrast  08/24/2013   CLINICAL DATA:  Blackout spells for 1 week. Numbness and tingling in the arms and legs. No neuro deficits.  EXAM: CT HEAD WITHOUT CONTRAST  TECHNIQUE: Contiguous axial images were obtained from the base of the skull through the vertex without contrast.  COMPARISON:  None  FINDINGS: No evidence for acute infarction, hemorrhage, mass lesion, hydrocephalus, or extra-axial fluid. BILATERAL abnormal prominence of the parasagittal parietal sulci appear to be related to incidental biparietal foramina, a congenital anomaly. Biparietal foramina could also be associated with cortical venous drainage anomalies. These are non acute findings.  Partial empty sella. Calvarium otherwise intact. Sinuses and mastoids clear.  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 No acute intracranial abnormality is detected.   Electronically Signed   By: Davonna Belling M.D.   On: 08/24/2013 17:16    EKG: Independently reviewed.  Assessment/Plan Principal Problem:   Hypertensive urgency Active Problems:   Seizures   1. HTN urgency - Will treat BP with goal SBP < 180.  Dont want to drop her too much in the acute setting at this point.  PRN hydralazine ordered and daily full dose 25mg  HCTZ ordered.  Repeat lab work in AM,  monitor kidney function and lytes.  Will need long term follow up for presumed underlying HTN diagnosis and treatment. 2. Seizures - Syncopal episode description c/w partial seizures with secondary generalized seizures, neurology on board, please see Dr. Marca Ancona note which is in the chart already, patient being loaded with keppra, also got a mg of ativan in the ED as she started having auras here as well.  EEG and MRI ordered.  Neurology is on board.  Code Status: Full  Family Communication: No family in room Disposition Plan: Admit to inpatient   Time spent: 70 min  GARDNER, JARED M. [facility] Pager [phone]  If 7AM-7PM, please contact the day team taking care of the patient [email or web address] Password [email or web address] 08/24/2013, 8:26 PM

## 2013-08-24 NOTE — ED Notes (Signed)
Patient returned from MRI.

## 2013-08-24 NOTE — ED Notes (Signed)
Patient returned from CT

## 2013-08-24 NOTE — ED Notes (Signed)
Phlebotomy at the bedside  

## 2013-08-24 NOTE — ED Notes (Signed)
MD made aware of the patient's symptoms.

## 2013-08-24 NOTE — ED Notes (Signed)
Dr. Yow at the bedside.  

## 2013-08-24 NOTE — ED Provider Notes (Addendum)
I saw and evaluated the patient, reviewed the resident's note and I agree with the findings and plan.   EKG Interpretation   Date/Time:  Thursday August 24 2013 15:27:37 EDT Ventricular Rate:  79 PR Interval:  158 QRS Duration: 82 QT Interval:  386 QTC Calculation: 442 R Axis:   62 Text Interpretation:  Normal sinus rhythm Normal ECG No significant change  since last tracing Confirmed by Angee Gupton  MD, Louise Victory ([id number]) on 08/24/2013 4:00:13  PM      Judith Earley FavorSmallwood is a 58 y.o. female hx of HTN uncompliant with meds here with possible seizures. Over 10 episodes of last month. She usually feels tingling in her hands and feet and then blacked out and wakes up an hour later. Several episodes associated with incontinence. Most recent episode was last night. Also uncompliant with BP meds. Denies chest pain or shortness of breath or abdominal pain or headache. Hypertensive on exam. Neuro exam unremarkable. NL gait, nl finger to nose. I was concerned for seizures. Neuro consulted and recommend keppra and inpatient workup. 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 Hypertensive, tried PO meds first with no relief. Given IV hydralazine and BP dec from 220 to 170. No signs of hypertensive emergency. Will admit for hypertensive urgency, seizure workup.    Richardean Canalavid H Brandyn Thien, MD 08/24/13 Brooke Pace1955  Richardean Canalavid H Caylie Sandquist, MD 08/24/13 2011

## 2013-08-24 NOTE — ED Notes (Signed)
Patient walked to the bathroom  Steady gait

## 2013-08-25 ENCOUNTER — Inpatient Hospital Stay (HOSPITAL_COMMUNITY): Payer: Self-pay

## 2013-08-25 DIAGNOSIS — I10 Essential (primary) hypertension: Secondary | ICD-10-CM

## 2013-08-25 LAB — CBC
HEMATOCRIT: 34.9 % — AB (ref 36.0–46.0)
Hemoglobin: 11.6 g/dL — ABNORMAL LOW (ref 12.0–15.0)
MCH: 27.1 pg (ref 26.0–34.0)
MCHC: 33.2 g/dL (ref 30.0–36.0)
MCV: 81.5 fL (ref 78.0–100.0)
Platelets: 229 10*3/uL (ref 150–400)
RBC: 4.28 MIL/uL (ref 3.87–5.11)
RDW: 15.1 % (ref 11.5–15.5)
WBC: 7.7 10*3/uL (ref 4.0–10.5)

## 2013-08-25 LAB — BASIC METABOLIC PANEL
Anion gap: 13 (ref 5–15)
BUN: 17 mg/dL (ref 6–23)
CHLORIDE: 104 meq/L (ref 96–112)
CO2: 23 mEq/L (ref 19–32)
Calcium: 9.1 mg/dL (ref 8.4–10.5)
Creatinine, Ser: 0.83 mg/dL (ref 0.50–1.10)
GFR calc Af Amer: 90 mL/min — ABNORMAL LOW (ref >90)
GFR, EST NON AFRICAN AMERICAN: 77 mL/min — AB (ref >90)
Glucose, Bld: 94 mg/dL (ref 70–99)
POTASSIUM: 3.8 meq/L (ref 3.7–5.3)
SODIUM: 140 meq/L (ref 137–147)

## 2013-08-25 MED ORDER — LEVETIRACETAM 500 MG PO TABS: 500.0000 mg | ORAL_TABLET | Freq: Two times a day (BID) | ORAL | Status: DC

## 2013-08-25 MED ORDER — LEVETIRACETAM 500 MG PO TABS
500.0000 mg | ORAL_TABLET | Freq: Two times a day (BID) | ORAL | Status: DC
  Administered 2013-08-25: 500 mg via ORAL
  Filled 2013-08-25: qty 1

## 2013-08-25 MED ORDER — LISINOPRIL 40 MG PO TABS: 40.0000 mg | ORAL_TABLET | Freq: Every day | ORAL | Status: DC

## 2013-08-25 MED ORDER — HYDROCHLOROTHIAZIDE 25 MG PO TABS: 25.0000 mg | ORAL_TABLET | Freq: Every day | ORAL | Status: DC

## 2013-08-25 NOTE — Progress Notes (Signed)
Discharge orders received, pt for discharge home today, IV D/C, D/C instructions and Rx given with verbalized understanding. Staff brought pt downstairs via wheelchair.

## 2013-08-25 NOTE — Progress Notes (Addendum)
[facility] PROGRESS NOTE Assessment/Plan: Hypertensive urgency - BP improved on ACE-I and HCTZ. - ? If due to seizure vs stroke.  Seizures: - MRI as below and EEG pending. - started on IV keppra. - pt consult pending.    Code Status: Full  Family Communication: No family in room  Disposition Plan: Admit to inpatient     Consultants:  neuro  Procedures: MRI brain 8.7.2015: No evidence for PRES. No restricted diffusion to suggest stroke or interictal/postictal phenomenon.  Dysplastic posterior parietal parasagittal cortex, LEFT greater than  RIGHT, consistent with a congenital intracranial manifestation of  biparietal foramina. These potentially could serve as epileptogenic foci.   EEG  CT head  Antibiotics:  none (indicate start date, and stop date if known)  HPI/Subjective: Wants to go home today  Objective: Filed Vitals:   08/24/13 2124 08/24/13 2200 08/25/13 0135 08/25/13 0700  BP: 127/71 146/73 123/50 159/88  Pulse: 65 75 63   Temp:  98 F (36.7 C) 97.6 F (36.4 C) 97.2 F (36.2 C)  TempSrc:  Oral Oral Oral  Resp: 18 18 18 20   Height:      SpO2: 99% 98% 94% 97%   No intake or output data in the 24 hours ending 08/25/13 0835 There were no vitals filed for this visit.  Exam:  General: Alert, awake, oriented x3, in no acute distress.  HEENT: No bruits, no goiter.  Heart: Regular rate and rhythm. Lungs: Good air movement, clear Abdomen: Soft, nontender, nondistended, positive bowel sounds.  Neuro: Grossly intact, nonfocal.   Data Reviewed: Basic Metabolic Panel:  Recent Labs Lab 08/24/13 1537 08/25/13 0458  NA 144 140  K 4.1 3.8  CL 108 104  CO2 25 23  GLUCOSE 89 94  BUN 21 17  CREATININE 1.01 0.83  CALCIUM 9.4 9.1   Liver Function Tests: No results found for this basename: AST, ALT, ALKPHOS, BILITOT, PROT, ALBUMIN,  in the last 168 hours No results found for this basename: LIPASE, AMYLASE,  in the last 168 hours No  results found for this basename: AMMONIA,  in the last 168 hours CBC:  Recent Labs Lab 08/24/13 1537 08/25/13 0458  WBC 7.1 7.7  HGB 12.9 11.6*  HCT 39.5 34.9*  MCV 81.6 81.5  PLT 256 229   Cardiac Enzymes: No results found for this basename: CKTOTAL, CKMB, CKMBINDEX, TROPONINI,  in the last 168 hours BNP (last 3 results) No results found for this basename: PROBNP,  in the last 8760 hours CBG:  Recent Labs Lab 08/24/13 1600  GLUCAP 91    No results found for this or any previous visit (from the past 240 hour(s)).   Studies: Dg Chest 2 View  08/24/2013   CLINICAL DATA:  Pain, syncopal episode  EXAM: CHEST  2 VIEW  COMPARISON:  Chest x-ray of 11/20/2006  FINDINGS: No active infiltrate or effusion is seen. Mediastinal and hilar contours appear unchanged. Mild cardiomegaly is stable. No acute bony abnormality is seen.  IMPRESSION: Stable mild cardiomegaly.  No active lung disease.   Electronically Signed   By: Dwyane Dee M.D.   On: 08/24/2013 17:01   Ct Head Wo Contrast  08/24/2013   CLINICAL DATA:  Blackout spells for 1 week. Numbness and tingling in the arms and legs. No neuro deficits.  EXAM: CT HEAD WITHOUT CONTRAST  TECHNIQUE: Contiguous axial images were obtained from the base of the skull through the vertex without contrast.  COMPARISON:  None  FINDINGS: No evidence for  acute infarction, hemorrhage, mass lesion, hydrocephalus, or extra-axial fluid. BILATERAL abnormal prominence of the parasagittal parietal sulci appear to be related to incidental biparietal foramina, a congenital anomaly. Biparietal foramina could also be associated with cortical venous drainage anomalies. These are non acute findings.  Partial empty sella. Calvarium otherwise intact. Sinuses and mastoids clear.  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 No acute intracranial abnormality is  detected.   Electronically Signed   By: Davonna BellingJohn  Curnes M.D.   On: 08/24/2013 17:16   Mr Brain Wo Contrast  08/24/2013   CLINICAL DATA:  Hypertensive urgency, as well as recurrent spells of loss of consciousness, possibly partial complex seizure with secondary generalization. Typical aura consists of sensory symptoms.  EXAM: MRI HEAD WITHOUT CONTRAST  TECHNIQUE: Multiplanar, multiecho pulse sequences of the brain and surrounding structures were obtained without intravenous contrast.  COMPARISON:  CT head earlier today.  FINDINGS: The patient was unable to remain motionless for the exam. Small or subtle lesions could be overlooked. The study is marginally diagnostic.  No restricted diffusion or visible hemorrhage. No midline abnormality. No mass lesion or extra-axial fluid. Coronal T2 weighted images to evaluate the temporal lobes demonstrate no focal abnormality in this region. Overall normal cerebral volume except as described below. Mild subcortical and periventricular T2 and FLAIR hyperintensities, likely chronic microvascular ischemic change. No foci of chronic hemorrhage. No evidence for PRES.  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 No definite gliosis. No heterotopic gray matter or schizencephaly. Within limits of evaluation on noncontrast MR, no developmental venous anomaly. No other calvarial anomalies. No significant sinus or mastoid disease. Negative orbits.  IMPRESSION: Severely motion degraded exam.  No evidence for PRES.  No restricted diffusion to suggest stroke or interictal/postictal phenomenon.  Dysplastic posterior parietal parasagittal cortex, LEFT greater than RIGHT, consistent with a congenital intracranial manifestation of biparietal foramina. These potentially could serve as epileptogenic foci.   Electronically Signed   By: Davonna BellingJohn  Curnes M.D.   On: 08/24/2013 21:47     Scheduled Meds: . heparin  5,000 Units Subcutaneous 3 times per day  . hydrochlorothiazide  25 mg Oral Daily  . sodium chloride  3 mL Intravenous Q12H   Continuous Infusions:    Marinda ElkFELIZ ORTIZ, Jamin Panther  [facility] Pager [phone]. If 8PM-8AM, please contact night-coverage at [email or web address], password [email or web address] 08/25/2013, 8:35 AM  LOS: 1 day      **Disclaimer: This note may have been dictated with voice recognition software. Similar sounding words can inadvertently be transcribed and this note may contain transcription errors which may not have been corrected upon publication of note.**

## 2013-08-25 NOTE — Evaluation (Signed)
Physical Therapy Evaluation Patient Details Name: Kirsten RampRenee [name] MRN: [id number] DOB: 1955-07-04 Today's Date: 08/25/2013   History of Present Illness  58 y.o. female admitted with with c/o recurrent spells of LOC since 2006 with recent increase in frequency of spells.  10 such episodes in last 4-6 weeks. Hx of HTN.  Clinical Impression  Patient evaluated by Physical Therapy with no further acute PT needs identified. All education has been completed and the patient has no further questions. Pt ambulating well with no significant deficits noted. Normal sensation and lower extremity muscle strength upon physical examination. She reports numbness and tingling in feet and hands prior to having syncopal episodes. Discussed safety with mobility at home and being aware of symptoms to prevent falls in future. See below for any follow-up Physial Therapy or equipment needs. PT is signing off. Thank you for this referral.     Follow Up Recommendations No PT follow up    Equipment Recommendations  None recommended by PT    Recommendations for Other Services       Precautions / Restrictions Precautions Precautions: None Restrictions Weight Bearing Restrictions: No      Mobility  Bed Mobility Overal bed mobility: Modified Independent                Transfers Overall transfer level: Modified independent Equipment used: None                Ambulation/Gait Ambulation/Gait assistance: Supervision Ambulation Distance (Feet): 75 Feet Assistive device: None Gait Pattern/deviations: Step-through pattern;Wide base of support   Gait velocity interpretation: at or above normal speed for age/gender General Gait Details: Slightly wide base of support. Ambulates generally well. able to carry bag of books and place in cabinet safely. No loss of balance with ambulation.  Stairs            Wheelchair Mobility    Modified Rankin (Stroke Patients Only)       Balance Overall  balance assessment: Needs assistance Sitting-balance support: No upper extremity supported;Feet supported Sitting balance-Leahy Scale: Good     Standing balance support: No upper extremity supported;During functional activity Standing balance-Leahy Scale: Good                               Pertinent Vitals/Pain Pain Assessment: No/denies pain    Home Living Family/patient expects to be discharged to:: Private residence Living Arrangements: Alone Available Help at Discharge: Neighbor;Available PRN/intermittently Type of Home: Apartment Home Access: Level entry     Home Layout: One level Home Equipment: None      Prior Function Level of Independence: Independent               Hand Dominance        Extremity/Trunk Assessment   Upper Extremity Assessment: Overall WFL for tasks assessed           Lower Extremity Assessment: Overall WFL for tasks assessed         Communication   Communication: No difficulties  Cognition Arousal/Alertness: Awake/alert Behavior During Therapy: WFL for tasks assessed/performed Overall Cognitive Status: Within Functional Limits for tasks assessed                      General Comments General comments (skin integrity, edema, etc.): Discussed safety due to history of falls with syncopal episodes at home. Pt reports she can tell that she is about to lose consciousness and lies on  floor to prevent falls, but has fall in past due to these episodes.    Exercises        Assessment/Plan    PT Assessment Patent does not need any further PT services  PT Diagnosis     PT Problem List    PT Treatment Interventions     PT Goals (Current goals can be found in the Care Plan section) Acute Rehab PT Goals Patient Stated Goal: Go home PT Goal Formulation: No goals set, d/c therapy    Frequency     Barriers to discharge        Co-evaluation               End of Session   Activity Tolerance: Patient  tolerated treatment well Patient left: in chair;with call bell/phone within reach           Time: 1410-1430 PT Time Calculation (min): 20 min   Charges:   PT Evaluation $Initial PT Evaluation Tier I: 1 Procedure PT Treatments $Self Care/Home Management: 8-22   PT G Codes:         Charlsie Merles, PT [phone]  [place] 08/25/2013, 2:41 PM

## 2013-08-25 NOTE — Progress Notes (Signed)
EEG completed; results pending.    

## 2013-08-25 NOTE — Procedures (Signed)
History: 58 year old female with a history of recurrent episodes of loss of consciousness concerning for seizure  Sedation: None  Technique: This is a 17 channel routine scalp EEG performed at the bedside with bipolar and monopolar montages arranged in accordance to the international 10/20 system of electrode placement. One channel was dedicated to EKG recording.    Background: The background consisted only of intermixed alpha and beta activities. There is a well defined posterior dominant rhythm of 9.5 Hz that attenuates with eye opening. Sleep spindles were briefly recorded.  Photic stimulation: Physiologic driving is present  EEG Abnormalities: None  Clinical Interpretation: This normal EEG is recorded in the waking and sleep state. There was no seizure or seizure predisposition recorded on this study.   Kirsten SlotMcNeill Dorothymae Maciver, MD [facility] [phone]  If 7pm- 7am, please page neurology on call as listed in AMION.

## 2013-08-25 NOTE — Progress Notes (Signed)
Received pt from the ED a&o x 4. No c/o pain. Vital taken, pt placed to tele monitoring. Oriented to room and unit. Call bell within reach. Will cont to monitor.

## 2013-08-25 NOTE — Progress Notes (Signed)
Subjective: No further events, tolerating keppra well.   Exam: Filed Vitals:   08/25/13 1417  BP: 182/90  Pulse:   Temp:   Resp:    Gen: In bed, NAD MS: Awake, alert, interactive and appropriate RU:EAVWUCN:PERRL, EOMI Motor: 5/5 throughout Sensory:intact to LT  Impression: 58 yo M with a history of multiple years of episodes concerning for seizure. I agree with AED therapy based on history.   Recommendations: 1) Keppra 500mg  BID 2) Likely would benefit from outpatient follow up with Neurology.  3) If unable to obtain Keppra due to funding, then could consider carbamazepine(through [facility] on $4 list), start at 200mg  BID.  Ritta SlotMcNeill Magan Winnett, MD [facility] [phone]  If 7pm- 7am, please page neurology on call as listed in AMION.

## 2013-08-25 NOTE — Progress Notes (Signed)
Pt BP 218/115, 10mg  IV Hydralazine given, rechecked within the hour, now 196/100.  Pt c/o dizziness.  MD notified, D/C orders stand.

## 2013-08-25 NOTE — Discharge Summary (Addendum)
Physician Discharge Summary  [name] KGS:[id number] DOB: 02/16/1955 DOA: 08/24/2013  PCP: No PCP Per Patient  Admit date: 08/24/2013 Discharge date: 08/25/2013  Time spent: 35 minutes  Recommendations for Outpatient Follow-up:  1. [facility] in 2 week. 2. Check b-met. Titrate antihypertensive as tolerate it.  Discharge Diagnoses:  Principal Problem:   Hypertensive urgency Active Problems:   Seizures   Discharge Condition: stable  Diet recommendation: low sodium  There were no vitals filed for this visit.  History of present illness:  58 y.o. female h/o HTN, not seeing a doctor, presents to ED with c/o recurrent spells of LOC since 2006 with recent increase in frequency of spells. 10 such episodes in last 4-6 weeks, last of which was earlier this morning. There is an aura consisting of bilateral numbness and tingling and feeling of discomfort in BUE and BLE prior to the spells she states. Period of unconsciousness is variable. She has been told she has convulsions involving her legs during these periods and is confused upon waking up.    Hospital Course:  Hypertensive urgency: - Patient with a previous diagnosis of hypertension, she has been noncompliant with her medications due to financial reasons. - She was started on an ACE inhibitor and hydrochlorothiazide with improvement in her blood pressure. - MRI of the head negative for stroke or press. EEG was done that showed no seizure events. - Started on IV keppra, as no evidence on EEG this was stopped. - pt recommended no needs.  Seizures:  - started on IV keppra with no further events. - MRI as below and EEG This normal EEG is recorded in the waking and sleep state. There was no seizure or seizure predisposition recorded on this study.  - change keppra to oral. Has appointment at the wellness center.  Procedures:  MRI  EEG  Consultations:  neurology  Discharge Exam: Filed Vitals:   08/25/13 1417  BP: 182/90  Pulse:   Temp:   Resp:     General: see progress note  Discharge Instructions You were cared for by a hospitalist during your hospital stay. If you have any questions about your discharge medications or the care you received while you were in the hospital after you are discharged, you can call the unit and asked to speak with the hospitalist on call if the hospitalist that took care of you is not available. Once you are discharged, your primary care physician will handle any further medical issues. Please note that NO REFILLS for any discharge medications will be authorized once you are discharged, as it is imperative that you return to your primary care physician (or establish a relationship with a primary care physician if you do not have one) for your aftercare needs so that they can reassess your need for medications and monitor your lab values.      Discharge Instructions   Diet - low sodium heart healthy    Complete by:  As directed      Increase activity slowly    Complete by:  As directed             Medication List         GREEN TEA PO  Take 1 tablet by mouth daily.     hydrochlorothiazide 25 MG tablet  Commonly known as:  HYDRODIURIL  Take 1 tablet (25 mg total) by mouth daily.     lisinopril 40 MG tablet  Commonly known as:  PRINIVIL,ZESTRIL  Take  1 tablet (40 mg total) by mouth daily.       Allergies  Allergen Reactions  . Codeine Anaphylaxis  . Tomato Itching and Rash   Follow-up Information   Follow up with [facility]     In 2 weeks. (hospital follow up)    Contact information:   [place] [id number] [phone]       The results of significant diagnostics from this hospitalization (including imaging, microbiology, ancillary and laboratory) are listed below for reference.    Significant Diagnostic Studies: Dg Chest 2 View  08/24/2013   CLINICAL DATA:  Pain, syncopal  episode  EXAM: CHEST  2 VIEW  COMPARISON:  Chest x-ray of 11/20/2006  FINDINGS: No active infiltrate or effusion is seen. Mediastinal and hilar contours appear unchanged. Mild cardiomegaly is stable. No acute bony abnormality is seen.  IMPRESSION: Stable mild cardiomegaly.  No active lung disease.   Electronically Signed   By: Ivar Drape M.D.   On: 08/24/2013 17:01   Ct Head Wo Contrast  08/24/2013   CLINICAL DATA:  Blackout spells for 1 week. Numbness and tingling in the arms and legs. No neuro deficits.  EXAM: CT HEAD WITHOUT CONTRAST  TECHNIQUE: Contiguous axial images were obtained from the base of the skull through the vertex without contrast.  COMPARISON:  None  FINDINGS: No evidence for acute infarction, hemorrhage, mass lesion, hydrocephalus, or extra-axial fluid. BILATERAL abnormal prominence of the parasagittal parietal sulci appear to be related to incidental biparietal foramina, a congenital anomaly. Biparietal foramina could also be associated with cortical venous drainage anomalies. These are non acute findings.  Partial empty sella. Calvarium otherwise intact. Sinuses and mastoids clear.  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 No acute intracranial abnormality is detected.   Electronically Signed   By: Rolla Flatten M.D.   On: 08/24/2013 17:16   Mr Brain Wo Contrast  08/24/2013   CLINICAL DATA:  Hypertensive urgency, as well as recurrent spells of loss of consciousness, possibly partial complex seizure with secondary generalization. Typical aura consists of sensory symptoms.  EXAM: MRI HEAD WITHOUT CONTRAST  TECHNIQUE: Multiplanar, multiecho pulse sequences of the brain and surrounding structures were obtained without intravenous contrast.  COMPARISON:  CT head earlier today.  FINDINGS: The patient was unable to remain motionless for the exam. Small or subtle lesions  could be overlooked. The study is marginally diagnostic.  No restricted diffusion or visible hemorrhage. No midline abnormality. No mass lesion or extra-axial fluid. Coronal T2 weighted images to evaluate the temporal lobes demonstrate no focal abnormality in this region. Overall normal cerebral volume except as described below. Mild subcortical and periventricular T2 and FLAIR hyperintensities, likely chronic microvascular ischemic change. No foci of chronic hemorrhage. No evidence for PRES.  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 No definite gliosis. No heterotopic gray matter or schizencephaly. Within limits of evaluation on noncontrast MR, no developmental venous anomaly. No other calvarial anomalies. No significant sinus or mastoid disease. Negative orbits.  IMPRESSION: Severely motion degraded exam.  No evidence for PRES.  No restricted diffusion to suggest stroke or interictal/postictal phenomenon.  Dysplastic posterior parietal parasagittal cortex, LEFT greater than RIGHT, consistent with a congenital intracranial manifestation of biparietal foramina. These potentially could serve as epileptogenic foci.   Electronically Signed  By: Rolla Flatten M.D.   On: 08/24/2013 21:47    Microbiology: No results found for this or any previous visit (from the past 240 hour(s)).   Labs: Basic Metabolic Panel:  Recent Labs Lab 08/24/13 1537 08/25/13 0458  NA 144 140  K 4.1 3.8  CL 108 104  CO2 25 23  GLUCOSE 89 94  BUN 21 17  CREATININE 1.01 0.83  CALCIUM 9.4 9.1   Liver Function Tests: No results found for this basename: AST, ALT, ALKPHOS, BILITOT, PROT, ALBUMIN,  in the last 168 hours No results found for this basename: LIPASE, AMYLASE,  in the last 168 hours No results found for this basename: AMMONIA,  in the last 168 hours CBC:  Recent Labs Lab 08/24/13 1537  08/25/13 0458  WBC 7.1 7.7  HGB 12.9 11.6*  HCT 39.5 34.9*  MCV 81.6 81.5  PLT 256 229   Cardiac Enzymes: No results found for this basename: CKTOTAL, CKMB, CKMBINDEX, TROPONINI,  in the last 168 hours BNP: BNP (last 3 results) No results found for this basename: PROBNP,  in the last 8760 hours CBG:  Recent Labs Lab 08/24/13 1600  GLUCAP 91       Signed:  FELIZ ORTIZ, Shuntavia Yerby  [facility] 08/25/2013, 2:35 PM

## 2013-08-25 NOTE — Care Management Note (Signed)
    Page 1 of 2   08/25/2013     4:54:37 PM CARE MANAGEMENT NOTE 08/25/2013  Patient:  [name], [name]   Account Number:  [id number]  Date Initiated:  08/25/2013  Documentation initiated by:  Lorne Skeens  Subjective/Objective Assessment:   Patient admitted with HTN and new siezures. Lives alone. Patient is without insurance/income, lives in subsidized housing paid for by her daughter in [place].  Pt reports minimal support locally.     Action/Plan:   Will follow for discharge needs pending PT/OT evals and physician orders.   Anticipated DC Date:  08/25/2013   Anticipated DC Plan:  HOME/SELF CARE  In-house referral  Financial Counselor  Clinical Social Worker      DC Forensic scientist  CM consult  [facility]  Medication Assistance      Choice offered to / List presented to:             Status of service:  Completed, signed off Medicare Important Message given?   (If response is "NO", the following Medicare IM given date fields will be blank) Date Medicare IM given:   Medicare IM given by:   Date Additional Medicare IM given:   Additional Medicare IM given by:    Discharge Disposition:  HOME/SELF CARE  Per UR Regulation:    If discussed at Long Length of Stay Meetings, dates discussed:    Comments:  08/25/13 [place] RN, MSN, CM- CM left voicemail for financial counselor to notify that patient was being discharged today and would need to be contacted regarding Medicaid.    08/25/13 [place], MSN, CM- Spoke with patient, who states she has no ride home and no way to get to the pharmacy to fill her medications.  She also states she has no way to cover the $3 copay, as she has no money with her at this time and her daughter who pays her bills is in [place].  CM spoke with a friend of the patient regarding possible assitance with a ride to the pharmacy and home.  Patient's friend seemd very confused and did not seem  to understand that patient was in the hospital and required a ride at discharge.  CM notified CSW that patient would need a ride home.  [place] program was provided and patient's RN obtained medications from the outpatient pharmacy, as CSW was unable to provide transportation to both the pharmacy and home.    08/25/13 [place], MSN, CM- Met with patient to obtain permission to make an appointment at the [facility] for hospital follow-up. Patient agreed and appointment was made for 08/29/13 at 0900. Patient was given written information regarding this appointment. Patient was also referred to the [facility] pharmacy and was asked to bring financial documentation for medication assistance.  Patient verbalized understanding.

## 2013-08-27 ENCOUNTER — Encounter (HOSPITAL_COMMUNITY): Payer: Self-pay | Admitting: Emergency Medicine

## 2013-08-27 ENCOUNTER — Emergency Department (HOSPITAL_COMMUNITY): Payer: Self-pay

## 2013-08-27 ENCOUNTER — Emergency Department (HOSPITAL_COMMUNITY)
Admission: EM | Admit: 2013-08-27 | Discharge: 2013-08-27 | Disposition: A | Discharge status: Home / Self Care | Payer: Self-pay | Attending: Emergency Medicine | Admitting: Emergency Medicine

## 2013-08-27 DIAGNOSIS — R519 Headache, unspecified: Secondary | ICD-10-CM

## 2013-08-27 DIAGNOSIS — I10 Essential (primary) hypertension: Secondary | ICD-10-CM | POA: Insufficient documentation

## 2013-08-27 DIAGNOSIS — Z79899 Other long term (current) drug therapy: Secondary | ICD-10-CM | POA: Insufficient documentation

## 2013-08-27 DIAGNOSIS — R51 Headache: Secondary | ICD-10-CM | POA: Insufficient documentation

## 2013-08-27 DIAGNOSIS — G40909 Epilepsy, unspecified, not intractable, without status epilepticus: Secondary | ICD-10-CM | POA: Insufficient documentation

## 2013-08-27 DIAGNOSIS — R110 Nausea: Secondary | ICD-10-CM | POA: Insufficient documentation

## 2013-08-27 HISTORY — DX: Unspecified convulsions: R56.9

## 2013-08-27 LAB — CBC WITH DIFFERENTIAL/PLATELET
Basophils Absolute: 0 10*3/uL (ref 0.0–0.1)
Basophils Relative: 0 % (ref 0–1)
EOS ABS: 0 10*3/uL (ref 0.0–0.7)
Eosinophils Relative: 0 % (ref 0–5)
HCT: 40.9 % (ref 36.0–46.0)
Hemoglobin: 13.8 g/dL (ref 12.0–15.0)
Lymphocytes Relative: 24 % (ref 12–46)
Lymphs Abs: 2 10*3/uL (ref 0.7–4.0)
MCH: 27.3 pg (ref 26.0–34.0)
MCHC: 33.7 g/dL (ref 30.0–36.0)
MCV: 80.8 fL (ref 78.0–100.0)
MONOS PCT: 8 % (ref 3–12)
Monocytes Absolute: 0.7 10*3/uL (ref 0.1–1.0)
NEUTROS PCT: 68 % (ref 43–77)
Neutro Abs: 5.8 10*3/uL (ref 1.7–7.7)
PLATELETS: 262 10*3/uL (ref 150–400)
RBC: 5.06 MIL/uL (ref 3.87–5.11)
RDW: 15.1 % (ref 11.5–15.5)
WBC: 8.5 10*3/uL (ref 4.0–10.5)

## 2013-08-27 LAB — BASIC METABOLIC PANEL
Anion gap: 11 (ref 5–15)
BUN: 26 mg/dL — ABNORMAL HIGH (ref 6–23)
CALCIUM: 9.6 mg/dL (ref 8.4–10.5)
CO2: 27 mEq/L (ref 19–32)
Chloride: 101 mEq/L (ref 96–112)
Creatinine, Ser: 0.99 mg/dL (ref 0.50–1.10)
GFR, EST AFRICAN AMERICAN: 72 mL/min — AB (ref >90)
GFR, EST NON AFRICAN AMERICAN: 63 mL/min — AB (ref >90)
Glucose, Bld: 103 mg/dL — ABNORMAL HIGH (ref 70–99)
POTASSIUM: 4.2 meq/L (ref 3.7–5.3)
SODIUM: 139 meq/L (ref 137–147)

## 2013-08-27 MED ORDER — PROCHLORPERAZINE EDISYLATE 5 MG/ML IJ SOLN
10.0000 mg | Freq: Four times a day (QID) | INTRAMUSCULAR | Status: DC | PRN
  Administered 2013-08-27: 10 mg via INTRAVENOUS
  Filled 2013-08-27: qty 2

## 2013-08-27 MED ORDER — KETOROLAC TROMETHAMINE 30 MG/ML IJ SOLN
30.0000 mg | Freq: Once | INTRAMUSCULAR | Status: AC
  Administered 2013-08-27: 30 mg via INTRAVENOUS
  Filled 2013-08-27: qty 1

## 2013-08-27 MED ORDER — DIPHENHYDRAMINE HCL 50 MG/ML IJ SOLN
25.0000 mg | Freq: Once | INTRAMUSCULAR | Status: AC
  Administered 2013-08-27: 25 mg via INTRAVENOUS
  Filled 2013-08-27: qty 1

## 2013-08-27 NOTE — ED Provider Notes (Signed)
CSN: [id number]     Arrival date & time 08/27/13  [id number] History   First MD Initiated Contact with Patient 08/27/13 0815     Chief Complaint  Patient presents with  . Headache  . Hypertension     (Consider location/radiation/quality/duration/timing/severity/associated sxs/prior Treatment) HPI Comments: Patient presents to the ER for evaluation of headaches. Patient reports that she had onset of headache this morning. Headache came on fairly suddenly. Patient does not currently have tics. Patient does report that she was recently diagnosed with seizures secondary to "blackout spells". She was started on Keppra for this. Patient also has a history of hypertension, has not medications this morning.  Patient reports severe, constant pain across the forehead area, radiating to the back of her head. She feels like the pain goes into her eyes. She has not noticed any vision change. No neck pain or stiffness. Patient denies extremity numbness, tingling and weakness. She does report associated nausea without vomiting.  Patient is a 58 y.o. female presenting with headaches and hypertension.  Headache Associated symptoms: nausea and seizures   Hypertension Associated symptoms include headaches.    Past Medical History  Diagnosis Date  . Hypertension   . Carpal tunnel syndrome   . Seizures    Past Surgical History  Procedure Laterality Date  . Abdominal hysterectomy    . Hernia repair    . Myomectomy     No family history on file. History  Substance Use Topics  . Smoking status: Never Smoker   . Smokeless tobacco: Not on file  . Alcohol Use: No   OB History   Grav Para Term Preterm Abortions TAB SAB Ect Mult Living                 Review of Systems  Gastrointestinal: Positive for nausea.  Neurological: Positive for seizures and headaches.  All other systems reviewed and are negative.     Allergies  Codeine and Tomato  Home Medications   Prior to Admission medications    Medication Sig Start Date End Date Taking? Authorizing Provider  hydrochlorothiazide (HYDRODIURIL) 25 MG tablet Take 1 tablet (25 mg total) by mouth daily. 08/25/13  Yes Marinda ElkAbraham Feliz Ortiz, MD  levETIRAcetam (KEPPRA) 500 MG tablet Take 1 tablet (500 mg total) by mouth 2 (two) times daily. 08/25/13  Yes Marinda ElkAbraham Feliz Ortiz, MD  lisinopril (PRINIVIL,ZESTRIL) 40 MG tablet Take 1 tablet (40 mg total) by mouth daily. 08/25/13  Yes Marinda ElkAbraham Feliz Ortiz, MD  OVER THE COUNTER MEDICATION Take 1 tablet by mouth daily. Weight loss pill   Yes Historical Provider, MD   BP 158/99  Pulse 69  Temp(Src) 98 F (36.7 C) (Oral)  Resp 14  Ht 5\' 2"  (1.575 m)  Wt 203 lb (92.08 kg)  BMI 37.12 kg/m2  SpO2 95% Physical Exam  Constitutional: She is oriented to person, place, and time. She appears well-developed and well-nourished. No distress.  HENT:  Head: Normocephalic and atraumatic.  Right Ear: Hearing normal.  Left Ear: Hearing normal.  Nose: Nose normal.  Mouth/Throat: Oropharynx is clear and moist and mucous membranes are normal.  Eyes: Conjunctivae and EOM are normal. Pupils are equal, round, and reactive to light.  Neck: Normal range of motion. Neck supple.  Cardiovascular: Regular rhythm, S1 normal and S2 normal.  Exam reveals no gallop and no friction rub.   No murmur heard. Pulmonary/Chest: Effort normal and breath sounds normal. No respiratory distress. She exhibits no tenderness.  Abdominal: Soft. Normal appearance and bowel  sounds are normal. There is no hepatosplenomegaly. There is no tenderness. There is no rebound, no guarding, no tenderness at McBurney's point and negative Murphy's sign. No hernia.  Musculoskeletal: Normal range of motion.  Neurological: She is alert and oriented to person, place, and time. She has normal strength. No cranial nerve deficit or sensory deficit. Coordination normal. GCS eye subscore is 4. GCS verbal subscore is 5. GCS motor subscore is 6.  Extraocular muscle  movement: normal No visual field cut Pupils: equal and reactive both direct and consensual response is normal No nystagmus present  Sensory function is intact to light touch, pinprick Proprioception intact  Grip strength 5/5 symmetric in upper extremities Lower extremity strength 5/5 against gravity No pronator drift Normal finger to nose bilaterally Normal heel to shin bilaterally     Skin: Skin is warm, dry and intact. No rash noted. No cyanosis.  Psychiatric: She has a normal mood and affect. Her speech is normal and behavior is normal. Thought content normal.    ED Course  Procedures (including critical care time) Labs Review Labs Reviewed  BASIC METABOLIC PANEL - Abnormal; Notable for the following:    Glucose, Bld 103 (*)    BUN 26 (*)    GFR calc non Af Amer 63 (*)    GFR calc Af Amer 72 (*)    All other components within normal limits  CBC WITH DIFFERENTIAL    Imaging Review Ct Head Wo Contrast  08/27/2013   CLINICAL DATA:  Headache. Blurred vision, nausea, hypertension. New medication for hypertension and seizures. Has blackouts.  EXAM: CT HEAD WITHOUT CONTRAST  TECHNIQUE: Contiguous axial images were obtained from the base of the skull through the vertex without intravenous contrast.  COMPARISON:  08/24/2013  FINDINGS: There is no intra or extra-axial fluid collection or mass lesion. The basilar cisterns and ventricles have a normal appearance. There is no CT evidence for acute infarction or hemorrhage. Again noted are prominent bilateral parietal foramina with associated prominence of the parasagittal parietal sulci. The appearance is stable.  Bone windows are otherwise unremarkable.  IMPRESSION: 1.  No evidence for acute  abnormality. 2. Prominent bilateral parietal foramina and associated underlying prominence of the parasagittal parietal sulci are stable.   Electronically Signed   By: Rosalie Gums M.D.   On: 08/27/2013 10:13     EKG Interpretation   Date/Time:   Sunday August 27 2013 08:34:28 EDT Ventricular Rate:  70 PR Interval:  161 QRS Duration: 85 QT Interval:  408 QTC Calculation: 440 R Axis:   71 Text Interpretation:  Sinus rhythm Ventricular premature complex Consider  left atrial enlargement No significant change since last tracing Confirmed  by Xzavian Semmel  MD, Kameko Hukill [phone]) on 08/27/2013 2:28:02 PM      MDM   Final diagnoses:  Headache in front of head    Patient presented to the ER for evaluation of headache. She had some concern over the possibility of elevated blood pressure, as she had missed at least one dose. Blood pressure was only slightly elevated here in the ER. Headache started this morning, somewhat suddenly. It was across the frontal region of the head. She had a normal neurologic exam, however. This CT is unremarkable. This was performed within 6 hours of onset of headache, making subarachnoid hemorrhage extremely unlikely. The patient had significant improvement of her headache during the period of time when she was awaiting CAT scan. No intervention have been provided. After giving Toradol, Compazine, Benadryl, headache has completely  resolved. This supports a diagnosis of vascular type headache. Because she has had complete resolution of her headache and her work up thus far has been negative, did not feel that she requires LP for further evaluation. Patient counseled to return immediately if her headache returns.    Gilda Crease, MD 08/27/13 [phone]

## 2013-08-27 NOTE — Discharge Instructions (Signed)

## 2013-08-27 NOTE — ED Notes (Signed)
[facility] with [facility]/o headache and hypertension. Pt recently diagnosed and started on medication for hypertension and seizures 6 days ago. Pt stated that she has "blackouts" and a neurologist seems to think the "blackouts" are seizures. Stated yesterday she had a "blackout" from 12:13am-12:37am that was not witness. Pt c/o 9/10 headache in frontal lobe with some blurry vision. Last BP- 160/111. EMS administered Zofran because pt c/o some nausea as well.

## 2013-08-29 ENCOUNTER — Ambulatory Visit: Payer: Self-pay | Attending: Internal Medicine | Admitting: Internal Medicine

## 2013-08-29 ENCOUNTER — Encounter: Payer: Self-pay | Admitting: Internal Medicine

## 2013-08-29 VITALS — BP 159/95 | HR 72 | Temp 98.1°F | Resp 18 | Ht 62.0 in | Wt 196.4 lb

## 2013-08-29 DIAGNOSIS — R569 Unspecified convulsions: Secondary | ICD-10-CM | POA: Insufficient documentation

## 2013-08-29 DIAGNOSIS — H538 Other visual disturbances: Secondary | ICD-10-CM | POA: Insufficient documentation

## 2013-08-29 DIAGNOSIS — Z91018 Allergy to other foods: Secondary | ICD-10-CM | POA: Insufficient documentation

## 2013-08-29 DIAGNOSIS — Z8249 Family history of ischemic heart disease and other diseases of the circulatory system: Secondary | ICD-10-CM | POA: Insufficient documentation

## 2013-08-29 DIAGNOSIS — Z885 Allergy status to narcotic agent status: Secondary | ICD-10-CM | POA: Insufficient documentation

## 2013-08-29 DIAGNOSIS — Z1211 Encounter for screening for malignant neoplasm of colon: Secondary | ICD-10-CM

## 2013-08-29 DIAGNOSIS — Z139 Encounter for screening, unspecified: Secondary | ICD-10-CM

## 2013-08-29 DIAGNOSIS — I10 Essential (primary) hypertension: Secondary | ICD-10-CM | POA: Insufficient documentation

## 2013-08-29 DIAGNOSIS — G5600 Carpal tunnel syndrome, unspecified upper limb: Secondary | ICD-10-CM | POA: Insufficient documentation

## 2013-08-29 NOTE — Patient Instructions (Signed)
DASH Eating Plan °DASH stands for "Dietary Approaches to Stop Hypertension." The DASH eating plan is a healthy eating plan that has been shown to reduce high blood pressure (hypertension). Additional health benefits may include reducing the risk of type 2 diabetes mellitus, heart disease, and stroke. The DASH eating plan may also help with weight loss. °WHAT DO I NEED TO KNOW ABOUT THE DASH EATING PLAN? °For the DASH eating plan, you will follow these general guidelines: °· Choose foods with a percent daily value for sodium of less than 5% (as listed on the food label). °· Use salt-free seasonings or herbs instead of table salt or sea salt. °· Check with your health care provider or pharmacist before using salt substitutes. °· Eat lower-sodium products, often labeled as "lower sodium" or "no salt added." °· Eat fresh foods. °· Eat more vegetables, fruits, and low-fat dairy products. °· Choose whole grains. Look for the word "whole" as the first word in the ingredient list. °· Choose fish and skinless chicken or turkey more often than red meat. Limit fish, poultry, and meat to 6 oz (170 g) each day. °· Limit sweets, desserts, sugars, and sugary drinks. °· Choose heart-healthy fats. °· Limit cheese to 1 oz (28 g) per day. °· Eat more home-cooked food and less restaurant, buffet, and fast food. °· Limit fried foods. °· Cook foods using methods other than frying. °· Limit canned vegetables. If you do use them, rinse them well to decrease the sodium. °· When eating at a restaurant, ask that your food be prepared with less salt, or no salt if possible. °WHAT FOODS CAN I EAT? °Seek help from a dietitian for individual calorie needs. °Grains °Whole grain or whole wheat bread. Brown rice. Whole grain or whole wheat pasta. Quinoa, bulgur, and whole grain cereals. Low-sodium cereals. Corn or whole wheat flour tortillas. Whole grain cornbread. Whole grain crackers. Low-sodium crackers. °Vegetables °Fresh or frozen vegetables  (raw, steamed, roasted, or grilled). Low-sodium or reduced-sodium tomato and vegetable juices. Low-sodium or reduced-sodium tomato sauce and paste. Low-sodium or reduced-sodium canned vegetables.  °Fruits °All fresh, canned (in natural juice), or frozen fruits. °Meat and Other Protein Products °Ground beef (85% or leaner), grass-fed beef, or beef trimmed of fat. Skinless chicken or turkey. Ground chicken or turkey. Pork trimmed of fat. All fish and seafood. Eggs. Dried beans, peas, or lentils. Unsalted nuts and seeds. Unsalted canned beans. °Dairy °Low-fat dairy products, such as skim or 1% milk, 2% or reduced-fat cheeses, low-fat ricotta or cottage cheese, or plain low-fat yogurt. Low-sodium or reduced-sodium cheeses. °Fats and Oils °Tub margarines without trans fats. Light or reduced-fat mayonnaise and salad dressings (reduced sodium). Avocado. Safflower, olive, or canola oils. Natural peanut or almond butter. °Other °Unsalted popcorn and pretzels. °The items listed above may not be a complete list of recommended foods or beverages. Contact your dietitian for more options. °WHAT FOODS ARE NOT RECOMMENDED? °Grains °White bread. White pasta. White rice. Refined cornbread. Bagels and croissants. Crackers that contain trans fat. °Vegetables °Creamed or fried vegetables. Vegetables in a cheese sauce. Regular canned vegetables. Regular canned tomato sauce and paste. Regular tomato and vegetable juices. °Fruits °Dried fruits. Canned fruit in light or heavy syrup. Fruit juice. °Meat and Other Protein Products °Fatty cuts of meat. Ribs, chicken wings, bacon, sausage, bologna, salami, chitterlings, fatback, hot dogs, bratwurst, and packaged luncheon meats. Salted nuts and seeds. Canned beans with salt. °Dairy °Whole or 2% milk, cream, half-and-half, and cream cheese. Whole-fat or sweetened yogurt. Full-fat   cheeses or blue cheese. Nondairy creamers and whipped toppings. Processed cheese, cheese spreads, or cheese  curds. °Condiments °Onion and garlic salt, seasoned salt, table salt, and sea salt. Canned and packaged gravies. Worcestershire sauce. Tartar sauce. Barbecue sauce. Teriyaki sauce. Soy sauce, including reduced sodium. Steak sauce. Fish sauce. Oyster sauce. Cocktail sauce. Horseradish. Ketchup and mustard. Meat flavorings and tenderizers. Bouillon cubes. Hot sauce. Tabasco sauce. Marinades. Taco seasonings. Relishes. °Fats and Oils °Butter, stick margarine, lard, shortening, ghee, and bacon fat. Coconut, palm kernel, or palm oils. Regular salad dressings. °Other °Pickles and olives. Salted popcorn and pretzels. °The items listed above may not be a complete list of foods and beverages to avoid. Contact your dietitian for more information. °WHERE CAN I FIND MORE INFORMATION? °National [employer], [employer], and [employer] Institute: www.nhlbi.nih.gov/health/health-topics/topics/dash/ °Document Released: 12/25/2010 Document Revised: 05/22/2013 Document Reviewed: 11/09/2012 °ExitCare® Patient Information ©2015 [employer]. This information is not intended to replace advice given to you by your health care provider. Make sure you discuss any questions you have with your health care provider. ° °

## 2013-08-29 NOTE — Progress Notes (Signed)
Patient Demographics  [name], is a 58 y.o. female  ZOX:[id number]  [id number]  DOB - [birth year]  CC:  Chief Complaint  Patient presents with  . Hospitalization Follow-up  . Seizures       HPI: [name] is a 58 y.o. female here today to establish medical care.She has history of hypertension, recently hospitalized with symptoms of seizures, EMR reviewed she was treated for hypertensive urgency, was started on ACE inhibitor and hydrochlorothiazide she had an MRI done which was negative for stroke, she had an EEG done which showed no seizures but she was started on IV Keppra initially and then changed to oral, she also went to the emergency room a few days ago with symptoms of headache, had a CT scan done which was negative for any acute findings. Patient currently denies any headache dizziness chest and shortness of breath, reports improvement in the symptoms since she is taking Keppra, blood pressure is also improved. Patient does report on and off blurry vision. Patient has No headache, No chest pain, No abdominal pain - No Nausea, No new weakness tingling or numbness, No Cough - SOB.  Allergies  Allergen Reactions  . Codeine Anaphylaxis  . Tomato Itching and Rash   Past Medical History  Diagnosis Date  . Hypertension   . Carpal tunnel syndrome   . Seizures    Current Outpatient Prescriptions on File Prior to Visit  Medication Sig Dispense Refill  . hydrochlorothiazide (HYDRODIURIL) 25 MG tablet Take 1 tablet (25 mg total) by mouth daily.  30 tablet  3  . levETIRAcetam (KEPPRA) 500 MG tablet Take 1 tablet (500 mg total) by mouth 2 (two) times daily.  60 tablet  3  . lisinopril (PRINIVIL,ZESTRIL) 40 MG tablet Take 1 tablet (40 mg total) by mouth daily.  30 tablet  3  . OVER THE COUNTER MEDICATION Take 1 tablet by mouth daily. Weight loss pill       No current facility-administered medications on file prior to visit.   Family History  Problem Relation  Age of Onset  . Asthma Mother   . Hypertension Father   . Heart disease Father   . Stroke Father   . Cancer Sister     uterus cancer   . Diabetes Maternal Uncle    History   Social History  . Marital Status: Divorced    Spouse Name: N/A    Number of Children: N/A  . Years of Education: N/A   Occupational History  . Not on file.   Social History Main Topics  . Smoking status: Never Smoker   . Smokeless tobacco: Not on file  . Alcohol Use: No  . Drug Use: No  . Sexual Activity: Not on file   Other Topics Concern  . Not on file   Social History Narrative  . No narrative on file    Review of Systems: Constitutional: Negative for fever, chills, diaphoresis, activity change, appetite change and fatigue. HENT: Negative for ear pain, nosebleeds, congestion, facial swelling, rhinorrhea, neck pain, neck stiffness and ear discharge.  Eyes: Negative for pain, discharge, redness, itching and visual disturbance. Respiratory: Negative for cough, choking, chest tightness, shortness of breath, wheezing and stridor.  Cardiovascular: Negative for chest pain, palpitations and leg swelling. Gastrointestinal: Negative for abdominal distention. Genitourinary: Negative for dysuria, urgency, frequency, hematuria, flank pain, decreased urine volume, difficulty urinating and dyspareunia.  Musculoskeletal: Negative for back pain, joint swelling, arthralgia and gait problem. Neurological: Negative for dizziness, tremors,  seizures, syncope, facial asymmetry, speech difficulty, weakness, light-headedness, numbness and headaches.  Hematological: Negative for adenopathy. Does not bruise/bleed easily. Psychiatric/Behavioral: Negative for hallucinations, behavioral problems, confusion, dysphoric mood, decreased concentration and agitation.    Objective:   Filed Vitals:   08/29/13 0927  BP: 159/95  Pulse: 72  Temp: 98.1 F (36.7 C)  Resp: 18    Physical Exam: Constitutional: Patient appears  well-developed and well-nourished. No distress. HENT: Normocephalic, atraumatic, External right and left ear normal. Oropharynx is clear and moist.  Eyes: Conjunctivae and EOM are normal. PERRLA, no scleral icterus. Neck: Normal ROM. Neck supple. No JVD. No tracheal deviation. No thyromegaly. CVS: RRR, S1/S2 +, no murmurs, no gallops, no carotid bruit.  Pulmonary: Effort and breath sounds normal, no stridor, rhonchi, wheezes, rales.  Abdominal: Soft. BS +, no distension, tenderness, rebound or guarding.  Musculoskeletal: Normal range of motion. No edema and no tenderness.  Neuro: Alert. Normal reflexes, muscle tone coordination. No cranial nerve deficit. Skin: Skin is warm and dry. No rash noted. Not diaphoretic. No erythema. No pallor. Psychiatric: Normal mood and affect. Behavior, judgment, thought content normal.  Lab Results  Component Value Date   WBC 8.5 08/27/2013   HGB 13.8 08/27/2013   HCT 40.9 08/27/2013   MCV 80.8 08/27/2013   PLT 262 08/27/2013   Lab Results  Component Value Date   CREATININE 0.99 08/27/2013   BUN 26* 08/27/2013   NA 139 08/27/2013   K 4.2 08/27/2013   CL 101 08/27/2013   CO2 27 08/27/2013    No results found for this basename: HGBA1C   Lipid Panel     Component Value Date/Time   CHOL  Value: 60        ATP III CLASSIFICATION:  <200     mg/dL   Desirable  161-096200-239  mg/dL   Borderline High  >=045>=240    mg/dL   High 40/98/119111/10/2006 [id number]   TRIG 67 11/29/2006 0809       Assessment and plan:   1. Seizures Currently on Keppra symptomatically improved.  2. HYPERTENSION, BENIGN SYSTEMIC Blood pressure is improved, she's currently on hydrochlorothiazide and lisinopril, also advise for DASH diet.  3. Blurry vision  - Ambulatory referral to Ophthalmology  4. Screening Will do fasting blood work. - COMPLETE METABOLIC PANEL WITH GFR; Future - MM DIGITAL SCREENING BILATERAL; Future - Lipid panel; Future - Vit D  25 hydroxy (rtn osteoporosis monitoring); Future - TSH;  Future  5. Special screening for malignant neoplasms, colon  - Ambulatory referral to Gastroenterology        Health Maintenance -Colonoscopy: referred to GI  -Mammogram: ordered    Return in about 3 months (around 11/29/2013).   Doris CheadleADVANI, Avnoor Koury, MD

## 2013-08-29 NOTE — Progress Notes (Signed)
Pt here to establish care per HFU- diagnosis Seizure disorder secondary to HTN urgency/crisis Pt was [facility] with d/c 08/24/13 on Keppra 500 mg tab and Lisinopril 40 mg tab,HCTZ 25 mg tab Pt has been taking meds daily without seizure activity Pt was seen by neuro- Dr. Roseanne RenoStewart Denies pain or headaches today BP 159/95 72. Informed pt goal 140/90 Pt uses cane assist  Need colonoscopy/mammogram

## 2013-09-06 ENCOUNTER — Ambulatory Visit: Payer: Self-pay | Attending: Internal Medicine

## 2013-09-06 DIAGNOSIS — Z139 Encounter for screening, unspecified: Secondary | ICD-10-CM

## 2013-09-06 LAB — COMPLETE METABOLIC PANEL WITH GFR
ALBUMIN: 4.4 g/dL (ref 3.5–5.2)
ALT: 16 U/L (ref 0–35)
AST: 21 U/L (ref 0–37)
Alkaline Phosphatase: 82 U/L (ref 39–117)
BILIRUBIN TOTAL: 0.4 mg/dL (ref 0.2–1.2)
BUN: 19 mg/dL (ref 6–23)
CO2: 27 mEq/L (ref 19–32)
Calcium: 9.7 mg/dL (ref 8.4–10.5)
Chloride: 105 mEq/L (ref 96–112)
Creat: 0.9 mg/dL (ref 0.50–1.10)
GFR, Est African American: 83 mL/min
GFR, Est Non African American: 72 mL/min
Glucose, Bld: 95 mg/dL (ref 70–99)
POTASSIUM: 3.9 meq/L (ref 3.5–5.3)
Sodium: 140 mEq/L (ref 135–145)
Total Protein: 7.4 g/dL (ref 6.0–8.3)

## 2013-09-06 LAB — TSH: TSH: 1.637 u[IU]/mL (ref 0.350–4.500)

## 2013-09-06 LAB — LIPID PANEL
Cholesterol: 132 mg/dL (ref 0–200)
HDL: 52 mg/dL (ref >39)
LDL Cholesterol: 65 mg/dL (ref 0–99)
TRIGLYCERIDES: 75 mg/dL (ref <150)
Total CHOL/HDL Ratio: 2.5 Ratio
VLDL: 15 mg/dL (ref 0–40)

## 2013-09-07 ENCOUNTER — Telehealth: Payer: Self-pay

## 2013-09-07 LAB — VITAMIN D 25 HYDROXY (VIT D DEFICIENCY, FRACTURES): Vit D, 25-Hydroxy: 29 ng/mL — ABNORMAL LOW (ref 30–89)

## 2013-09-07 NOTE — Telephone Encounter (Signed)
Message copied by Lestine MountJUAREZ, Cindy Brindisi L on Thu Sep 07, 2013 12:33 PM ------      Message from: Doris CheadleADVANI, DEEPAK      Created: Thu Sep 07, 2013 11:32 AM       Blood work reviewed, noticed low vitamin D, advise patient to start taking OTC 2000 units daily.       ------

## 2013-09-07 NOTE — Telephone Encounter (Signed)
Patient not available Left message on voice mail to return our call 

## 2013-09-08 ENCOUNTER — Telehealth: Payer: Self-pay | Admitting: Emergency Medicine

## 2013-09-08 ENCOUNTER — Telehealth: Payer: Self-pay | Admitting: Internal Medicine

## 2013-09-08 ENCOUNTER — Ambulatory Visit: Payer: Self-pay | Attending: Internal Medicine | Admitting: *Deleted

## 2013-09-08 DIAGNOSIS — Z599 Problem related to housing and economic circumstances, unspecified: Secondary | ICD-10-CM

## 2013-09-08 MED ORDER — VITAMIN D 50 MCG (2000 UT) PO CAPS: 2000.0000 [IU] | ORAL_CAPSULE | Freq: Every day | ORAL | Status: DC

## 2013-09-08 NOTE — Telephone Encounter (Signed)
Pt. Returning call for nurse. Please f/u with pt.  °

## 2013-09-08 NOTE — Progress Notes (Signed)
LCSW met with patient who needs support with resources for medication and ongoing medical care. LCSW provided patient with application for [facility] and encouraged patient to come in for a walk in on Thursday. LCSW also coordinated appt with [facility] program for Thursday at 19:30am for Keppra.   Patient identified understanding of information through teach back.  Patient will follow with LCSW as needed.  Christene Lye MSW, LCSW

## 2013-09-08 NOTE — Telephone Encounter (Signed)
Pt given lab results. Pt unable to afford medication for Vitamin D; informed we will give courtesy to pick up on Monday

## 2013-09-14 ENCOUNTER — Ambulatory Visit: Payer: Self-pay | Attending: Internal Medicine

## 2013-10-05 ENCOUNTER — Emergency Department (HOSPITAL_COMMUNITY)
Admission: EM | Admit: 2013-10-05 | Discharge: 2013-10-05 | Disposition: A | Discharge status: Home / Self Care | Payer: Self-pay | Attending: Emergency Medicine | Admitting: Emergency Medicine

## 2013-10-05 ENCOUNTER — Encounter (HOSPITAL_COMMUNITY): Payer: Self-pay | Admitting: Emergency Medicine

## 2013-10-05 ENCOUNTER — Emergency Department (HOSPITAL_COMMUNITY): Payer: Self-pay

## 2013-10-05 DIAGNOSIS — R29810 Facial weakness: Secondary | ICD-10-CM | POA: Insufficient documentation

## 2013-10-05 DIAGNOSIS — G40909 Epilepsy, unspecified, not intractable, without status epilepticus: Secondary | ICD-10-CM | POA: Insufficient documentation

## 2013-10-05 DIAGNOSIS — Z79899 Other long term (current) drug therapy: Secondary | ICD-10-CM | POA: Insufficient documentation

## 2013-10-05 DIAGNOSIS — I10 Essential (primary) hypertension: Secondary | ICD-10-CM | POA: Insufficient documentation

## 2013-10-05 LAB — URINALYSIS, ROUTINE W REFLEX MICROSCOPIC
BILIRUBIN URINE: NEGATIVE
Glucose, UA: NEGATIVE mg/dL
Hgb urine dipstick: NEGATIVE
Ketones, ur: NEGATIVE mg/dL
Leukocytes, UA: NEGATIVE
NITRITE: NEGATIVE
PH: 5.5 (ref 5.0–8.0)
Protein, ur: NEGATIVE mg/dL
SPECIFIC GRAVITY, URINE: 1.009 (ref 1.005–1.030)
UROBILINOGEN UA: 0.2 mg/dL (ref 0.0–1.0)

## 2013-10-05 LAB — CBC WITH DIFFERENTIAL/PLATELET
Basophils Absolute: 0 10*3/uL (ref 0.0–0.1)
Basophils Relative: 0 % (ref 0–1)
EOS ABS: 0.1 10*3/uL (ref 0.0–0.7)
Eosinophils Relative: 1 % (ref 0–5)
HEMATOCRIT: 36.7 % (ref 36.0–46.0)
HEMOGLOBIN: 12.2 g/dL (ref 12.0–15.0)
LYMPHS ABS: 1.9 10*3/uL (ref 0.7–4.0)
Lymphocytes Relative: 31 % (ref 12–46)
MCH: 26.7 pg (ref 26.0–34.0)
MCHC: 33.2 g/dL (ref 30.0–36.0)
MCV: 80.3 fL (ref 78.0–100.0)
MONO ABS: 0.4 10*3/uL (ref 0.1–1.0)
MONOS PCT: 7 % (ref 3–12)
Neutro Abs: 3.6 10*3/uL (ref 1.7–7.7)
Neutrophils Relative %: 61 % (ref 43–77)
Platelets: 265 10*3/uL (ref 150–400)
RBC: 4.57 MIL/uL (ref 3.87–5.11)
RDW: 15.1 % (ref 11.5–15.5)
WBC: 5.9 10*3/uL (ref 4.0–10.5)

## 2013-10-05 LAB — COMPREHENSIVE METABOLIC PANEL
ALBUMIN: 3.8 g/dL (ref 3.5–5.2)
ALT: 20 U/L (ref 0–35)
ANION GAP: 13 (ref 5–15)
AST: 29 U/L (ref 0–37)
Alkaline Phosphatase: 85 U/L (ref 39–117)
BILIRUBIN TOTAL: 0.3 mg/dL (ref 0.3–1.2)
BUN: 15 mg/dL (ref 6–23)
CO2: 24 mEq/L (ref 19–32)
CREATININE: 0.88 mg/dL (ref 0.50–1.10)
Calcium: 9.5 mg/dL (ref 8.4–10.5)
Chloride: 108 mEq/L (ref 96–112)
GFR calc non Af Amer: 72 mL/min — ABNORMAL LOW (ref >90)
GFR, EST AFRICAN AMERICAN: 84 mL/min — AB (ref >90)
GLUCOSE: 85 mg/dL (ref 70–99)
Potassium: 3.9 mEq/L (ref 3.7–5.3)
Sodium: 145 mEq/L (ref 137–147)
Total Protein: 7.3 g/dL (ref 6.0–8.3)

## 2013-10-05 LAB — RAPID URINE DRUG SCREEN, HOSP PERFORMED
Amphetamines: NOT DETECTED
Barbiturates: NOT DETECTED
Benzodiazepines: NOT DETECTED
COCAINE: NOT DETECTED
Opiates: NOT DETECTED
TETRAHYDROCANNABINOL: NOT DETECTED

## 2013-10-05 LAB — APTT: aPTT: 33 seconds (ref 24–37)

## 2013-10-05 LAB — TROPONIN I: Troponin I: <0.3 ng/mL (ref <0.30)

## 2013-10-05 LAB — CBG MONITORING, ED: GLUCOSE-CAPILLARY: 80 mg/dL (ref 70–99)

## 2013-10-05 LAB — ETHANOL

## 2013-10-05 MED ORDER — SODIUM CHLORIDE 0.9 % IV BOLUS (SEPSIS): 500.0000 mL | Freq: Once | INTRAVENOUS | Status: DC

## 2013-10-05 MED ORDER — LEVETIRACETAM 500 MG PO TABS: 1000.0000 mg | ORAL_TABLET | Freq: Two times a day (BID) | ORAL | Status: DC

## 2013-10-05 MED ORDER — PREDNISONE 20 MG PO TABS: 60.0000 mg | ORAL_TABLET | ORAL | Status: AC

## 2013-10-05 MED ORDER — SODIUM CHLORIDE 0.9 % IV SOLN: 100.0000 mL/h | INTRAVENOUS | Status: DC

## 2013-10-05 MED ORDER — PREDNISONE 20 MG PO TABS
60.0000 mg | ORAL_TABLET | ORAL | Status: AC
  Administered 2013-10-05: 60 mg via ORAL
  Filled 2013-10-05: qty 3

## 2013-10-05 NOTE — ED Provider Notes (Signed)
CSN: [id number]     Arrival date & time 10/05/13  1232 History   First MD Initiated Contact with Patient 10/05/13 1236     Chief Complaint  Patient presents with  . Facial Droop      HPI  Patient presents with concern of possible no facial droop. Patient's multiple medical problems, including recently diagnosed seizures, chronic right leg neuropathy. Taken the patient was found to have a new left facial droop, but the patient was made aware of my colleagues. Patient denies any weakness in either upper extremity, no difficulty seeing, speaking, breathing, new pain. Patient states that she takes all medication as directed.   Past Medical History  Diagnosis Date  . Hypertension   . Carpal tunnel syndrome   . Seizures    Past Surgical History  Procedure Laterality Date  . Abdominal hysterectomy    . Hernia repair    . Myomectomy     Family History  Problem Relation Age of Onset  . Asthma Mother   . Hypertension Father   . Heart disease Father   . Stroke Father   . Cancer Sister     uterus cancer   . Diabetes Maternal Uncle    History  Substance Use Topics  . Smoking status: Never Smoker   . Smokeless tobacco: Never Used  . Alcohol Use: No   OB History   Grav Para Term Preterm Abortions TAB SAB Ect Mult Living                 Review of Systems  Constitutional:       Per HPI, otherwise negative  HENT:       Per HPI, otherwise negative  Respiratory:       Per HPI, otherwise negative  Cardiovascular:       Per HPI, otherwise negative  Gastrointestinal: Negative for vomiting.  Endocrine:       Negative aside from HPI  Genitourinary:       Neg aside from HPI   Musculoskeletal:       Per HPI, otherwise negative  Skin: Negative.   Neurological: Positive for seizures and weakness. Negative for syncope.      Allergies  Codeine and Tomato  Home Medications   Prior to Admission medications   Medication Sig Start Date End Date Taking? Authorizing Provider   Cholecalciferol (VITAMIN D) 2000 UNITS CAPS Take 1 capsule (2,000 Units total) by mouth daily. 09/08/13   Doris Cheadle, MD  hydrochlorothiazide (HYDRODIURIL) 25 MG tablet Take 1 tablet (25 mg total) by mouth daily. 08/25/13   Marinda Elk, MD  levETIRAcetam (KEPPRA) 500 MG tablet Take 1 tablet (500 mg total) by mouth 2 (two) times daily. 08/25/13   Marinda Elk, MD  lisinopril (PRINIVIL,ZESTRIL) 40 MG tablet Take 1 tablet (40 mg total) by mouth daily. 08/25/13   Marinda Elk, MD  OVER THE COUNTER MEDICATION Take 1 tablet by mouth daily. Weight loss pill    Historical Provider, MD   BP 126/84  Pulse 58  Temp(Src) 98.6 F (37 C) (Oral)  Resp 12  Ht  (1.575 m)  Wt 196 lb 12.8 oz (89.268 kg)  BMI 35.99 kg/m2  SpO2 100% Physical Exam  Nursing note and vitals reviewed. Constitutional: She is oriented to person, place, and time. She appears well-developed and well-nourished. No distress.  HENT:  Head: Normocephalic and atraumatic.  Eyes: Conjunctivae and EOM are normal. Pupils are equal, round, and reactive to light. Right eye exhibits  no discharge. Left eye exhibits no discharge.  Cardiovascular: Normal rate and regular rhythm.   Pulmonary/Chest: Effort normal and breath sounds normal. No stridor. No respiratory distress.  Abdominal: She exhibits no distension.  Musculoskeletal: She exhibits no edema.  Neurological: She is alert and oriented to person, place, and time. A cranial nerve deficit is present.  Left facial asymmetry, with loss of the nasal-labial fold, no appreciable loss of sensation.  Patient has weakness in the right lower extremity, this is unchanged. Grip strength is appropriate laterally, upper extremity strength is symmetric, 5/5  Skin: Skin is warm and dry.  Psychiatric: She has a normal mood and affect.    ED Course  Procedures (including critical care time) Labs Review Labs Reviewed  COMPREHENSIVE METABOLIC PANEL - Abnormal; Notable for the  following:    GFR calc non Af Amer 72 (*)    GFR calc Af Amer 84 (*)    All other components within normal limits  APTT  CBC WITH DIFFERENTIAL  ETHANOL  TROPONIN I  URINE RAPID DRUG SCREEN (HOSP PERFORMED)  URINALYSIS, ROUTINE W REFLEX MICROSCOPIC  CBG MONITORING, ED    Imaging Review Ct Head Wo Contrast  10/05/2013   ADDENDUM REPORT: 10/05/2013 13:05  ADDENDUM: These results were called by telephone at the time of interpretation on 10/05/2013 at 1:05 pm to Dr. Roseanne Reno, who verbally acknowledged these results.   Electronically Signed   By: Charline Bills M.D.   On: 10/05/2013 13:05   10/05/2013   CLINICAL DATA:  Code stroke, left facial droop, history of seizure  EXAM: CT HEAD WITHOUT CONTRAST  TECHNIQUE: Contiguous axial images were obtained from the base of the skull through the vertex without intravenous contrast.  COMPARISON:  08/27/2013  FINDINGS: No evidence of parenchymal hemorrhage or extra-axial fluid collection. No mass lesion, mass effect, or midline shift.  No CT evidence of acute infarction.  Mild small vessel ischemic changes in the subcortical right frontal lobe.  Stable prominence of the bilateral parasagittal parietal sulci, chronic.  Cerebral volume is within normal limits.  No ventriculomegaly.  The visualized paranasal sinuses are essentially clear. The mastoid air cells are unopacified.  No evidence of calvarial fracture.  IMPRESSION: No evidence of acute intracranial abnormality.  Electronically Signed: By: Charline Bills M.D. On: 10/05/2013 12:56     EKG Interpretation None     After the initial evaluation I discussed the patient's case with our neurology team.  Neurologists, who have seen the patient within the past 2 weeks concur with likely else palsy as diagnosis, with no evidence for acute stroke 3  Chart review demonstrated the patient had a CT, MRI within the past 10 days, consistent with these findings, all reassuring.  MDM   Patient presents with new  left facial droop, no other new neurologic complaints or findings, the patient does have baseline neuropathy, as well as newly diagnosed seizures. After discussion with neurology, patient was discharged in stable condition to followup as an outpatient.    Gerhard Munch, MD 10/05/13 1414

## 2013-10-05 NOTE — Discharge Instructions (Signed)
As discussed, your evaluation is largely reassuring.  Your facial asymmetry or weakness is likely due to Bell's palsy.  It is important that you followup with your primary care physician and your neurologist as scheduled.  Return for concerning changes in your condition.

## 2013-10-05 NOTE — ED Notes (Signed)
Dr. Kandice Hams at bedside

## 2013-10-05 NOTE — ED Notes (Signed)
Dr Stuart at bedside. 

## 2013-10-09 ENCOUNTER — Ambulatory Visit: Payer: Self-pay | Attending: Internal Medicine | Admitting: Internal Medicine

## 2013-10-09 ENCOUNTER — Encounter: Payer: Self-pay | Admitting: Internal Medicine

## 2013-10-09 VITALS — BP 170/100 | HR 100 | Temp 98.2°F | Resp 16 | Wt 197.6 lb

## 2013-10-09 DIAGNOSIS — R7309 Other abnormal glucose: Secondary | ICD-10-CM | POA: Insufficient documentation

## 2013-10-09 DIAGNOSIS — R7303 Prediabetes: Secondary | ICD-10-CM

## 2013-10-09 DIAGNOSIS — I10 Essential (primary) hypertension: Secondary | ICD-10-CM | POA: Insufficient documentation

## 2013-10-09 DIAGNOSIS — R29810 Facial weakness: Secondary | ICD-10-CM | POA: Insufficient documentation

## 2013-10-09 DIAGNOSIS — R569 Unspecified convulsions: Secondary | ICD-10-CM | POA: Insufficient documentation

## 2013-10-09 DIAGNOSIS — Z0000 Encounter for general adult medical examination without abnormal findings: Secondary | ICD-10-CM

## 2013-10-09 DIAGNOSIS — R030 Elevated blood-pressure reading, without diagnosis of hypertension: Secondary | ICD-10-CM

## 2013-10-09 DIAGNOSIS — IMO0001 Reserved for inherently not codable concepts without codable children: Secondary | ICD-10-CM

## 2013-10-09 HISTORY — DX: Prediabetes: R73.03

## 2013-10-09 HISTORY — DX: Facial weakness: R29.810

## 2013-10-09 LAB — POCT GLYCOSYLATED HEMOGLOBIN (HGB A1C): HEMOGLOBIN A1C: 6.4

## 2013-10-09 MED ORDER — LISINOPRIL-HYDROCHLOROTHIAZIDE 20-12.5 MG PO TABS: 2.0000 | ORAL_TABLET | Freq: Every day | ORAL | Status: DC

## 2013-10-09 MED ORDER — AMLODIPINE BESYLATE 10 MG PO TABS: 10.0000 mg | ORAL_TABLET | Freq: Every day | ORAL | Status: DC

## 2013-10-09 MED ORDER — METFORMIN HCL 500 MG PO TABS: 500.0000 mg | ORAL_TABLET | Freq: Every day | ORAL | Status: DC

## 2013-10-09 MED ORDER — CLONIDINE HCL 0.1 MG PO TABS
0.2000 mg | ORAL_TABLET | Freq: Once | ORAL | Status: AC
  Administered 2013-10-09: 0.2 mg via ORAL

## 2013-10-09 NOTE — Progress Notes (Signed)
Patient here for follow up from a stroke Present in office today with elevated blood stroke

## 2013-10-09 NOTE — Patient Instructions (Signed)
Diabetes and Exercise Exercising regularly is important. It is not just about losing weight. It has many health benefits, such as:  Improving your overall fitness, flexibility, and endurance.  Increasing your bone density.  Helping with weight control.  Decreasing your body fat.  Increasing your muscle strength.  Reducing stress and tension.  Improving your overall health. People with diabetes who exercise gain additional benefits because exercise:  Reduces appetite.  Improves the body's use of blood sugar (glucose).  Helps lower or control blood glucose.  Decreases blood pressure.  Helps control blood lipids (such as cholesterol and triglycerides).  Improves the body's use of the hormone insulin by:  Increasing the body's insulin sensitivity.  Reducing the body's insulin needs.  Decreases the risk for heart disease because exercising:  Lowers cholesterol and triglycerides levels.  Increases the levels of good cholesterol (such as high-density lipoproteins [HDL]) in the body.  Lowers blood glucose levels. YOUR ACTIVITY PLAN  Choose an activity that you enjoy and set realistic goals. Your health care provider or diabetes educator can help you make an activity plan that works for you. Exercise regularly as directed by your health care provider. This includes:  Performing resistance training twice a week such as push-ups, sit-ups, lifting weights, or using resistance bands.  Performing 150 minutes of cardio exercises each week such as walking, running, or playing sports.  Staying active and spending no more than 90 minutes at one time being inactive. Even short bursts of exercise are good for you. Three 10-minute sessions spread throughout the day are just as beneficial as a single 30-minute session. Some exercise ideas include:  Taking the dog for a walk.  Taking the stairs instead of the elevator.  Dancing to your favorite song.  Doing an exercise  video.  Doing your favorite exercise with a friend. RECOMMENDATIONS FOR EXERCISING WITH TYPE 1 OR TYPE 2 DIABETES   Check your blood glucose before exercising. If blood glucose levels are greater than 240 mg/dL, check for urine ketones. Do not exercise if ketones are present.  Avoid injecting insulin into areas of the body that are going to be exercised. For example, avoid injecting insulin into:  The arms when playing tennis.  The legs when jogging.  Keep a record of:  Food intake before and after you exercise.  Expected peak times of insulin action.  Blood glucose levels before and after you exercise.  The type and amount of exercise you have done.  Review your records with your health care provider. Your health care provider will help you to develop guidelines for adjusting food intake and insulin amounts before and after exercising.  If you take insulin or oral hypoglycemic agents, watch for signs and symptoms of hypoglycemia. They include:  Dizziness.  Shaking.  Sweating.  Chills.  Confusion.  Drink plenty of water while you exercise to prevent dehydration or heat stroke. Body water is lost during exercise and must be replaced.  Talk to your health care provider before starting an exercise program to make sure it is safe for you. Remember, almost any type of activity is better than none. Document Released: 03/28/2003 Document Revised: 05/22/2013 Document Reviewed: 06/14/2012 Exit[employer] Patient Information 2015 [employer]. This information is not intended to replace advice given to you by your health care provider. Make sure you discuss any questions you have with your health care provider.  

## 2013-10-09 NOTE — Progress Notes (Signed)
MRN: [id number] Name: [name]  Sex: female Age: 58 y.o. DOB: Feb 04, 1955  Allergies: Codeine; Banana; Orange fruit; Shrimp; and Tomato  Chief Complaint  Patient presents with  . Follow-up    stroke    HPI: Patient is 58 y.o. female who  recently went to the emergency room with the symptoms of facial droop on the left side, EMR reviewed patient had a CAT scan done which was negative for any acute pathology, patient was prescribed prednisone and as per patient she has followed up with her neurologist and her Keppra dose was increased and she has been taking prednisone reporting improvement in her symptoms, patient was told she needs to get A1c checked, her blood pressure is elevated, as per patient the medication she was given does not help her with controlling blood pressure she was on lisinopril and hydrochlorothiazide, patient denies any headache dizziness chest and shortness of breath or any new numbness or weakness.  Past Medical History  Diagnosis Date  . Hypertension   . Carpal tunnel syndrome   . Seizures     Past Surgical History  Procedure Laterality Date  . Abdominal hysterectomy    . Hernia repair    . Myomectomy        Medication List       This list is accurate as of: 10/09/13  4:44 PM.  Always use your most recent med list.               amLODipine 10 MG tablet  Commonly known as:  NORVASC  Take 1 tablet (10 mg total) by mouth daily.     cholecalciferol 1000 UNITS tablet  Commonly known as:  VITAMIN D  Take 1,000 Units by mouth daily.     hydrochlorothiazide 25 MG tablet  Commonly known as:  HYDRODIURIL  Take 1 tablet (25 mg total) by mouth daily.     levETIRAcetam 500 MG tablet  Commonly known as:  KEPPRA  Take 2 tablets (1,000 mg total) by mouth 2 (two) times daily.     lisinopril 40 MG tablet  Commonly known as:  PRINIVIL,ZESTRIL  Take 1 tablet (40 mg total) by mouth daily.     lisinopril-hydrochlorothiazide 20-12.5 MG per tablet    Commonly known as:  ZESTORETIC  Take 2 tablets by mouth daily.     metFORMIN 500 MG tablet  Commonly known as:  GLUCOPHAGE  Take 1 tablet (500 mg total) by mouth daily with breakfast.     predniSONE 20 MG tablet  Commonly known as:  DELTASONE  Take 3 tablets (60 mg total) by mouth stat.        Meds ordered this encounter  Medications  . cloNIDine (CATAPRES) tablet 0.2 mg    Sig:   . DISCONTD: amLODipine (NORVASC) 10 MG tablet    Sig: Take 1 tablet (10 mg total) by mouth daily.    Dispense:  90 tablet    Refill:  3  . DISCONTD: lisinopril-hydrochlorothiazide (ZESTORETIC) 20-12.5 MG per tablet    Sig: Take 2 tablets by mouth daily.    Dispense:  180 tablet    Refill:  3  . lisinopril-hydrochlorothiazide (ZESTORETIC) 20-12.5 MG per tablet    Sig: Take 2 tablets by mouth daily.    Dispense:  180 tablet    Refill:  3  . amLODipine (NORVASC) 10 MG tablet    Sig: Take 1 tablet (10 mg total) by mouth daily.    Dispense:  90 tablet  Refill:  3  . metFORMIN (GLUCOPHAGE) 500 MG tablet    Sig: Take 1 tablet (500 mg total) by mouth daily with breakfast.    Dispense:  90 tablet    Refill:  3    Immunization History  Administered Date(s) Administered  . Td 01/19/1998    Family History  Problem Relation Age of Onset  . Asthma Mother   . Hypertension Father   . Heart disease Father   . Stroke Father   . Cancer Sister     uterus cancer   . Diabetes Maternal Uncle     History  Substance Use Topics  . Smoking status: Never Smoker   . Smokeless tobacco: Never Used  . Alcohol Use: No    Review of Systems   As noted in HPI  Filed Vitals:   10/09/13 1615  BP: 170/100  Pulse:   Temp:   Resp:     Physical Exam  Physical Exam  HENT:  Left facial droop, loss of left nasolabial fold  Eyes: EOM are normal. Pupils are equal, round, and reactive to light.  Cardiovascular: Normal rate and regular rhythm.   Pulmonary/Chest: Breath sounds normal. No respiratory  distress. She has no wheezes. She has no rales.  Musculoskeletal: She exhibits no edema.    CBC    Component Value Date/Time   WBC 5.9 10/05/2013 1255   RBC 4.57 10/05/2013 1255   HGB 12.2 10/05/2013 1255   HCT 36.7 10/05/2013 1255   PLT 265 10/05/2013 1255   MCV 80.3 10/05/2013 1255   LYMPHSABS 1.9 10/05/2013 1255   MONOABS 0.4 10/05/2013 1255   EOSABS 0.1 10/05/2013 1255   BASOSABS 0.0 10/05/2013 1255    CMP     Component Value Date/Time   NA 145 10/05/2013 1255   K 3.9 10/05/2013 1255   CL 108 10/05/2013 1255   CO2 24 10/05/2013 1255   GLUCOSE 85 10/05/2013 1255   BUN 15 10/05/2013 1255   CREATININE 0.88 10/05/2013 1255   CREATININE 0.90 09/06/2013 0915   CALCIUM 9.5 10/05/2013 1255   PROT 7.3 10/05/2013 1255   ALBUMIN 3.8 10/05/2013 1255   AST 29 10/05/2013 1255   ALT 20 10/05/2013 1255   ALKPHOS 85 10/05/2013 1255   BILITOT 0.3 10/05/2013 1255   GFRNONAA 72* 10/05/2013 1255   GFRNONAA 72 09/06/2013 0915   GFRAA 84* 10/05/2013 1255   GFRAA 83 09/06/2013 0915    Lab Results  Component Value Date/Time   CHOL 132 09/06/2013  9:15 AM    No components found with this basename: hga1c    Lab Results  Component Value Date/Time   AST 29 10/05/2013 12:55 PM    Assessment and Plan  Elevated blood pressure - Plan: cloNIDine (CATAPRES) tablet 0.2 mg, repeat manual blood pressure is 170/100  Preventative health care - Plan: HgB A1c  Seizures/Facial droop Currently on Keppra following up with the neurologist Also taking prednisone.  Essential hypertension, benign - Plan: I prescribed patient the combination of lisinopril/hydrochlorothiazide also added amlodipine 10 mg, advise for DASH diet she'll come back in 2 weeks for BP check. lisinopril-hydrochlorothiazide (ZESTORETIC) 20-12.5 MG per tablet, amLODipine (NORVASC) 10 MG tablet,   Prediabetes - Plan: Her hemoglobin A1c today is 6.4%, I have advised patient for diabetes meal planning, also prescribed metFORMIN (GLUCOPHAGE) 500 MG tablet to  take daily.   Return in about 3 months (around 01/08/2014) for hypertension, BP check in 2 weeks/Nurse Visit.  Doris Cheadle, MD

## 2013-10-10 ENCOUNTER — Telehealth: Payer: Self-pay | Admitting: Internal Medicine

## 2013-10-10 ENCOUNTER — Telehealth: Payer: Self-pay | Admitting: *Deleted

## 2013-10-10 NOTE — Telephone Encounter (Signed)
Pt calling for A1C lab results. Please f/u with pt.

## 2013-10-10 NOTE — Telephone Encounter (Signed)
Pt is aware of her lab results and instructed to eat healthy and excersie

## 2013-10-24 ENCOUNTER — Ambulatory Visit: Payer: Self-pay | Attending: Internal Medicine | Admitting: *Deleted

## 2013-10-24 VITALS — BP 128/81 | HR 81 | Temp 98.2°F | Wt 196.0 lb

## 2013-10-24 DIAGNOSIS — Z23 Encounter for immunization: Secondary | ICD-10-CM | POA: Insufficient documentation

## 2013-10-24 NOTE — Patient Instructions (Signed)
Diabetes Mellitus and Food It is important for you to manage your blood sugar (glucose) level. Your blood glucose level can be greatly affected by what you eat. Eating healthier foods in the appropriate amounts throughout the day at about the same time each day will help you control your blood glucose level. It can also help slow or prevent worsening of your diabetes mellitus. Healthy eating may even help you improve the level of your blood pressure and reach or maintain a healthy weight.  HOW CAN FOOD AFFECT ME? Carbohydrates Carbohydrates affect your blood glucose level more than any other type of food. Your dietitian will help you determine how many carbohydrates to eat at each meal and teach you how to count carbohydrates. Counting carbohydrates is important to keep your blood glucose at a healthy level, especially if you are using insulin or taking certain medicines for diabetes mellitus. Alcohol Alcohol can cause sudden decreases in blood glucose (hypoglycemia), especially if you use insulin or take certain medicines for diabetes mellitus. Hypoglycemia can be a life-threatening condition. Symptoms of hypoglycemia (sleepiness, dizziness, and disorientation) are similar to symptoms of having too much alcohol.  If your health care provider has given you approval to drink alcohol, do so in moderation and use the following guidelines:  Women should not have more than one drink per day, and men should not have more than two drinks per day. One drink is equal to:  12 oz of beer.  5 oz of wine.  1 oz of hard liquor.  Do not drink on an empty stomach.  Keep yourself hydrated. Have water, diet soda, or unsweetened iced tea.  Regular soda, juice, and other mixers might contain a lot of carbohydrates and should be counted. WHAT FOODS ARE NOT RECOMMENDED? As you make food choices, it is important to remember that all foods are not the same. Some foods have fewer nutrients per serving than other  foods, even though they might have the same number of calories or carbohydrates. It is difficult to get your body what it needs when you eat foods with fewer nutrients. Examples of foods that you should avoid that are high in calories and carbohydrates but low in nutrients include:  Trans fats (most processed foods list trans fats on the Nutrition Facts label).  Regular soda.  Juice.  Candy.  Sweets, such as cake, pie, doughnuts, and cookies.  Fried foods. WHAT FOODS CAN I EAT? Have nutrient-rich foods, which will nourish your body and keep you healthy. The food you should eat also will depend on several factors, including:  The calories you need.  The medicines you take.  Your weight.  Your blood glucose level.  Your blood pressure level.  Your cholesterol level. You also should eat a variety of foods, including:  Protein, such as meat, poultry, fish, tofu, nuts, and seeds (lean animal proteins are best).  Fruits.  Vegetables.  Dairy products, such as milk, cheese, and yogurt (low fat is best).  Breads, grains, pasta, cereal, rice, and beans.  Fats such as olive oil, trans fat-free margarine, canola oil, avocado, and olives. DOES EVERYONE WITH DIABETES MELLITUS HAVE THE SAME MEAL PLAN? Because every person with diabetes mellitus is different, there is not one meal plan that works for everyone. It is very important that you meet with a dietitian who will help you create a meal plan that is just right for you. Document Released: 10/02/2004 Document Revised: 01/10/2013 Document Reviewed: 12/02/2012 [employer] Patient Information 2015 [employer]. This   information is not intended to replace advice given to you by your health care provider. Make sure you discuss any questions you have with your health care provider. Diabetes and Exercise Exercising regularly is important. It is not just about losing weight. It has many health benefits, such as:  Improving your overall  fitness, flexibility, and endurance.  Increasing your bone density.  Helping with weight control.  Decreasing your body fat.  Increasing your muscle strength.  Reducing stress and tension.  Improving your overall health. People with diabetes who exercise gain additional benefits because exercise:  Reduces appetite.  Improves the body's use of blood sugar (glucose).  Helps lower or control blood glucose.  Decreases blood pressure.  Helps control blood lipids (such as cholesterol and triglycerides).  Improves the body's use of the hormone insulin by:  Increasing the body's insulin sensitivity.  Reducing the body's insulin needs.  Decreases the risk for heart disease because exercising:  Lowers cholesterol and triglycerides levels.  Increases the levels of good cholesterol (such as high-density lipoproteins [HDL]) in the body.  Lowers blood glucose levels. YOUR ACTIVITY PLAN  Choose an activity that you enjoy and set realistic goals. Your health care provider or diabetes educator can help you make an activity plan that works for you. Exercise regularly as directed by your health care provider. This includes:  Performing resistance training twice a week such as push-ups, sit-ups, lifting weights, or using resistance bands.  Performing 150 minutes of cardio exercises each week such as walking, running, or playing sports.  Staying active and spending no more than 90 minutes at one time being inactive. Even short bursts of exercise are good for you. Three 10-minute sessions spread throughout the day are just as beneficial as a single 30-minute session. Some exercise ideas include:  Taking the dog for a walk.  Taking the stairs instead of the elevator.  Dancing to your favorite song.  Doing an exercise video.  Doing your favorite exercise with a friend. RECOMMENDATIONS FOR EXERCISING WITH TYPE 1 OR TYPE 2 DIABETES   Check your blood glucose before exercising. If  blood glucose levels are greater than 240 mg/dL, check for urine ketones. Do not exercise if ketones are present.  Avoid injecting insulin into areas of the body that are going to be exercised. For example, avoid injecting insulin into:  The arms when playing tennis.  The legs when jogging.  Keep a record of:  Food intake before and after you exercise.  Expected peak times of insulin action.  Blood glucose levels before and after you exercise.  The type and amount of exercise you have done.  Review your records with your health care provider. Your health care provider will help you to develop guidelines for adjusting food intake and insulin amounts before and after exercising.  If you take insulin or oral hypoglycemic agents, watch for signs and symptoms of hypoglycemia. They include:  Dizziness.  Shaking.  Sweating.  Chills.  Confusion.  Drink plenty of water while you exercise to prevent dehydration or heat stroke. Body water is lost during exercise and must be replaced.  Talk to your health care provider before starting an exercise program to make sure it is safe for you. Remember, almost any type of activity is better than none. Document Released: 03/28/2003 Document Revised: 05/22/2013 Document Reviewed: 06/14/2012 [employer] Patient Information 2015 [employer]. This information is not intended to replace advice given to you by your health care provider. Make sure you discuss any   questions you have with your health care provider.  

## 2013-10-24 NOTE — Progress Notes (Signed)
Patient presents for BP check States completed HCTZ and Lisinopril 40 yesterday Started Lisinopril 20-12.5 two tabs today. Patient states that she has been following the DASH diet and  walking about 50 minutes several days/week  BP 128/80 P 81 SPO2 97% Weight 196 lbs.  Patient given flu vaccine  Reviewed lab work with patient including lipid panel Patient given information on Diabetes and Food and Diabetes and Exercise  Patient knows to f/u with PCP in 3 months (around 01/08/2014)

## 2013-11-20 ENCOUNTER — Ambulatory Visit: Payer: Self-pay | Attending: Internal Medicine

## 2013-11-24 ENCOUNTER — Other Ambulatory Visit: Payer: Self-pay

## 2013-11-24 MED ORDER — LEVETIRACETAM 500 MG PO TABS: 1000.0000 mg | ORAL_TABLET | Freq: Two times a day (BID) | ORAL | Status: DC

## 2014-01-30 ENCOUNTER — Ambulatory Visit: Payer: Self-pay | Attending: Internal Medicine | Admitting: Internal Medicine

## 2014-01-30 ENCOUNTER — Encounter: Payer: Self-pay | Admitting: Internal Medicine

## 2014-01-30 VITALS — BP 134/89 | HR 70 | Temp 98.8°F | Resp 16 | Wt 196.0 lb

## 2014-01-30 DIAGNOSIS — R7309 Other abnormal glucose: Secondary | ICD-10-CM | POA: Insufficient documentation

## 2014-01-30 DIAGNOSIS — R7303 Prediabetes: Secondary | ICD-10-CM

## 2014-01-30 DIAGNOSIS — I10 Essential (primary) hypertension: Secondary | ICD-10-CM

## 2014-01-30 DIAGNOSIS — G40909 Epilepsy, unspecified, not intractable, without status epilepticus: Secondary | ICD-10-CM | POA: Insufficient documentation

## 2014-01-30 DIAGNOSIS — M79671 Pain in right foot: Secondary | ICD-10-CM

## 2014-01-30 DIAGNOSIS — Z1231 Encounter for screening mammogram for malignant neoplasm of breast: Secondary | ICD-10-CM

## 2014-01-30 DIAGNOSIS — R569 Unspecified convulsions: Secondary | ICD-10-CM

## 2014-01-30 LAB — GLUCOSE, POCT (MANUAL RESULT ENTRY): POC GLUCOSE: 78 mg/dL (ref 70–99)

## 2014-01-30 LAB — POCT GLYCOSYLATED HEMOGLOBIN (HGB A1C): HEMOGLOBIN A1C: 5.7

## 2014-01-30 NOTE — Progress Notes (Signed)
Patient here for follow up on her diabetes and hypertension 

## 2014-01-30 NOTE — Progress Notes (Signed)
MRN: [id number] Name: [name]  Sex: female Age: 59 y.o. DOB: 1955/08/03  Allergies: Codeine; Banana; Orange fruit; Shrimp; and Tomato  Chief Complaint  Patient presents with  . Follow-up    HPI: Patient is 59 y.o. female who has history of seizure disorder, hypertension,prediabetes, on the last visit she was started on metformin, her hemoglobin A1c has improved , she denies any hypoglycemic symptoms, following up with her neurologist, she does complaining of right foot pain on and off, she does report prior history of surgery in the past, she has lost follow up with podiatrist and needs referral, denies any fever chills any recent trauma.  Past Medical History  Diagnosis Date  . Hypertension   . Carpal tunnel syndrome   . Seizures     Past Surgical History  Procedure Laterality Date  . Abdominal hysterectomy    . Hernia repair    . Myomectomy        Medication List       This list is accurate as of: 01/30/14 12:43 PM.  Always use your most recent med list.               amLODipine 10 MG tablet  Commonly known as:  NORVASC  Take 1 tablet (10 mg total) by mouth daily.     cholecalciferol 1000 UNITS tablet  Commonly known as:  VITAMIN D  Take 1,000 Units by mouth daily.     hydrochlorothiazide 25 MG tablet  Commonly known as:  HYDRODIURIL  Take 1 tablet (25 mg total) by mouth daily.     levETIRAcetam 500 MG tablet  Commonly known as:  KEPPRA  Take 2 tablets (1,000 mg total) by mouth 2 (two) times daily.     lisinopril 40 MG tablet  Commonly known as:  PRINIVIL,ZESTRIL  Take 1 tablet (40 mg total) by mouth daily.     lisinopril-hydrochlorothiazide 20-12.5 MG per tablet  Commonly known as:  ZESTORETIC  Take 2 tablets by mouth daily.     metFORMIN 500 MG tablet  Commonly known as:  GLUCOPHAGE  Take 1 tablet (500 mg total) by mouth daily with breakfast.        No orders of the defined types were placed in this encounter.    Immunization  History  Administered Date(s) Administered  . Influenza,inj,Quad PF,36+ Mos 10/24/2013  . Td 01/19/1998    Family History  Problem Relation Age of Onset  . Asthma Mother   . Hypertension Father   . Heart disease Father   . Stroke Father   . Cancer Sister     uterus cancer   . Diabetes Maternal Uncle     History  Substance Use Topics  . Smoking status: Never Smoker   . Smokeless tobacco: Never Used  . Alcohol Use: No    Review of Systems   As noted in HPI  Filed Vitals:   01/30/14 1213  BP: 134/89  Pulse: 70  Temp: 98.8 F (37.1 C)  Resp: 16    Physical Exam  Physical Exam  Cardiovascular: Normal rate and regular rhythm.   Pulmonary/Chest: Breath sounds normal. No respiratory distress. She has no wheezes. She has no rales.  Musculoskeletal:  Right foot old surgical scar , 2+ dorsalis pedis pulse, no erythema or swelling    CBC    Component Value Date/Time   WBC 5.9 10/05/2013 1255   RBC 4.57 10/05/2013 1255   HGB 12.2 10/05/2013 1255   HCT 36.7 10/05/2013 1255  PLT 265 10/05/2013 1255   MCV 80.3 10/05/2013 1255   LYMPHSABS 1.9 10/05/2013 1255   MONOABS 0.4 10/05/2013 1255   EOSABS 0.1 10/05/2013 1255   BASOSABS 0.0 10/05/2013 1255    CMP     Component Value Date/Time   NA 145 10/05/2013 1255   K 3.9 10/05/2013 1255   CL 108 10/05/2013 1255   CO2 24 10/05/2013 1255   GLUCOSE 85 10/05/2013 1255   BUN 15 10/05/2013 1255   CREATININE 0.88 10/05/2013 1255   CREATININE 0.90 09/06/2013 0915   CALCIUM 9.5 10/05/2013 1255   PROT 7.3 10/05/2013 1255   ALBUMIN 3.8 10/05/2013 1255   AST 29 10/05/2013 1255   ALT 20 10/05/2013 1255   ALKPHOS 85 10/05/2013 1255   BILITOT 0.3 10/05/2013 1255   GFRNONAA 72* 10/05/2013 1255   GFRNONAA 72 09/06/2013 0915   GFRAA 84* 10/05/2013 1255   GFRAA 83 09/06/2013 0915    Lab Results  Component Value Date/Time   CHOL 132 09/06/2013 09:15 AM    No components found for: HGA1C  Lab Results  Component Value  Date/Time   AST 29 10/05/2013 12:55 PM    Assessment and Plan  Prediabetes - Plan:  Results for orders placed or performed in visit on 01/30/14  Glucose (CBG)  Result Value Ref Range   POC Glucose 78.0 70 - 99 mg/dl  HgB Q6VA1c  Result Value Ref Range   Hemoglobin A1C 5.70    HgB A1c has improved , continue with metformin.  Essential hypertension, benign The pressure is well-controlled continued current meds.  Seizures Currently patient is on Keppra symptoms are stable follow with neurology  Right foot pain - Plan: Ambulatory referral to Podiatry  Encounter for screening mammogram for breast cancer - Plan: MM DIGITAL SCREENING BILATERAL   Health Maintenance  -Mammogram: ordered  -Vaccinations: uptodate with the flu shot   Return in about 3 months (around 05/01/2014) for hypertension, prediabetes.  Doris CheadleADVANI, Reilley Latorre, MD

## 2014-02-12 ENCOUNTER — Ambulatory Visit: Payer: Self-pay

## 2014-02-14 ENCOUNTER — Ambulatory Visit: Payer: Self-pay | Admitting: Podiatry

## 2014-02-14 ENCOUNTER — Ambulatory Visit: Payer: No Typology Code available for payment source | Admitting: Podiatry

## 2014-02-14 ENCOUNTER — Ambulatory Visit: Payer: No Typology Code available for payment source

## 2014-02-14 ENCOUNTER — Encounter: Payer: Self-pay | Admitting: Podiatry

## 2014-02-14 VITALS — BP 132/86 | HR 62 | Resp 18

## 2014-02-14 DIAGNOSIS — M205X1 Other deformities of toe(s) (acquired), right foot: Secondary | ICD-10-CM

## 2014-02-14 DIAGNOSIS — M2061 Acquired deformities of toe(s), unspecified, right foot: Secondary | ICD-10-CM

## 2014-02-14 DIAGNOSIS — R52 Pain, unspecified: Secondary | ICD-10-CM

## 2014-02-14 DIAGNOSIS — G629 Polyneuropathy, unspecified: Secondary | ICD-10-CM

## 2014-02-14 NOTE — Progress Notes (Signed)
   Subjective:    Patient ID: [name], female    DOB: [birth year], 59 y.o.   MRN: [id number]  HPI  59 year old female presents the office today with complaints of right foot pain which has been ongoing for several months. She states that she's had multiple foot surgeries. She states that after approximately 30 minutes of activity she has pain to the right foot for which she points over the medial aspect of the foot over the first MTPJ and plantarly. She states that she has discomfort to this area which makes her stop walking. She also states that she had a reconstructive surgery performed in [facility] in 2009 and she feels as if her foot is starting to turn again. She also states that she has tingling and numbness to both of her feet which has been ongoing. She denies any history of injury or trauma. 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 She states that she has had Botox injections into her calf muscles. No other complaints at this time.   Review of Systems  HENT: Positive for ear pain and hearing loss.        RINGING IN EARS  Eyes: Positive for itching.  Musculoskeletal: Positive for back pain and gait problem.       JOINT PAIN AND MUSCLE PAIN  Skin:       CHANGE IN NAILS  Allergic/Immunologic: Positive for food allergies.  Neurological: Positive for seizures, weakness and numbness.  Psychiatric/Behavioral: Positive for hallucinations. The patient is nervous/anxious.   All other systems reviewed and are negative.      Objective:   Physical Exam AAO 3, NAD DP/PT pulses palpable, CRT less than 3 seconds Decreased protective sensation with Simms Weinstein monofilament, vibratory sensation appears to be intact. Achilles tendon reflex intact. There is tenderness palpation overlying the right first MTPJ and along the medial aspect of the first metatarsal head. There is decreased in hallux range of motion of the first  MTPJ on the right more than the left. There is discomfort upon range of motion of right first MTPJ. There is no crepitus noted. Decrease in medial arch height upon weightbearing bilaterally. Hammertoe contractures present. No other areas of tenderness to bilateral lower extremity. No overlying edema, erythema, increase in warmth. MMT 4/5, ROM WNL except for otherwise mentioned No open lesions or pre-ulcer lesions identified. No pain with calf compression, swelling, warmth, erythema.     Assessment & Plan:   59 year old female with symptomatic flatfoot deformity, hallux limitus; likely early neuropathy -X-rays were obtained and reviewed with the patient -Treatment options were discussed with the patient including alternatives, risks, competitions. -At this time given the patient's foot type as well as hallux limitus recommended a custom molded orthotic to help support her foot type help take pressure off this enzymatic areas. However due to insurance issues unable to do this at this time. Offloading pads were made for the patient shoes. She states that upon ambulation the pads are comfortable and help take pressure off that symptomatic areas. -Also educated the patient on neuropathy. She's been recently seen a neurologist and recommend her to continue to follow up with them for this issue as well. -Follow-up in 4 weeks or sooner should any problems arise. In the meantime, encouraged to call the office with any questions, concerns, change in symptoms.  *Lisa (medial assistant) was present for the entire evaluation and treatment per the patient's request.

## 2014-02-15 ENCOUNTER — Telehealth: Payer: Self-pay | Admitting: Internal Medicine

## 2014-02-15 NOTE — Telephone Encounter (Signed)
Pt  Advice to make f/u appointment with PCP for Tx

## 2014-02-15 NOTE — Telephone Encounter (Signed)
Patient called stating that she went to the [facility] and was diagnosed with peripheral neuropathy and was told that her PCP would have to prescribe her some medication to treat for her condition. Please f/u with pt.

## 2014-02-20 ENCOUNTER — Ambulatory Visit (HOSPITAL_COMMUNITY): Payer: Self-pay

## 2014-02-20 ENCOUNTER — Ambulatory Visit (HOSPITAL_COMMUNITY)
Admission: RE | Admit: 2014-02-20 | Discharge: 2014-02-20 | Disposition: A | Discharge status: Home / Self Care | Payer: Self-pay | Source: Ambulatory Visit | Attending: Internal Medicine | Admitting: Internal Medicine

## 2014-02-20 DIAGNOSIS — Z1231 Encounter for screening mammogram for malignant neoplasm of breast: Secondary | ICD-10-CM | POA: Insufficient documentation

## 2014-03-14 ENCOUNTER — Ambulatory Visit: Payer: No Typology Code available for payment source | Admitting: Podiatry

## 2014-03-14 ENCOUNTER — Encounter: Payer: Self-pay | Admitting: Podiatry

## 2014-03-14 VITALS — BP 112/69 | HR 74 | Resp 18

## 2014-03-14 DIAGNOSIS — L84 Corns and callosities: Secondary | ICD-10-CM

## 2014-03-14 DIAGNOSIS — M205X1 Other deformities of toe(s) (acquired), right foot: Secondary | ICD-10-CM

## 2014-03-14 DIAGNOSIS — G629 Polyneuropathy, unspecified: Secondary | ICD-10-CM

## 2014-03-18 NOTE — Progress Notes (Signed)
Patient ID: Kirsten RampRenee [name], female   DOB: 1955-03-15, 59 y.o.   MRN: [id number]  Subjective: 59 year old female returns the office today for follow-up evaluation of right foot pain and neuropathy. She states that she has contacted her primary care physician in regards to the neuropathy however she has not yet been seen. She does that since last appointment she has been wearing the inserts that were made for her in the office which seem to help alleviate the pain particularly in her right foot. She denies any acute changes since last appointment and no other complaints at this time. Denies any systemic complaints as fevers, chills, nausea, vomiting.  Objective: AAO 3, NAD DP/PT pulses palpable, CRT less than 3 seconds Protective sensation decreased with Simms Weinstein monofilament, vibratory sensation intact, Achilles tendon reflex intact. At this time there is no tenderness to palpation overlying the right first MTPJ. There is significant decrease in range of motion of the first MTPJ on the right. There is a scar overlying the area from prior surgery which is well-healed. There is a prominent medial sesamoid palpable and there is an overlying hyperkeratotic lesion. Upon debridement was lesion there is no underlying ulceration, drainage or other clinical signs of infection. Hammertoe contractures are present. There is no other areas of tenderness to bilateral lower extremities. Decrease in medial arch height upon weightbearing bilaterally. Equinus bilaterally. No overlying edema, erythema, increase in warmth. MMT 4/5, ROM WNL except otherwise stated above. No open lesions or other pre-ulcerative lesions identified. No pain with calf compression, swelling, warmth, erythema.  Assessment: 59 year old female with resolving right foot pain due to symptomatically flatfoot deformity, hallux limitus; likely neuropathy  Plan: -Treatment options were discussed the patient quit alternatives, risks,  complications. -Hyperkeratotic lesion sharply debrided 1 without complication/bleeding. -Continue with the inserts. New inserts were also made for the patient is appointment given to her. -Recommended the patient follow-up with her primary care physician/neurologist in regards to possible neuropathy. She is asking for medication for this phase appointment however discussed with her that she should follow up with her neurologist in regards to this. -Follow-up in 3 months or sooner if any problems are to arise. In the meantime, encouraged to call the office with any questions, concerns, changes symptoms.

## 2014-04-05 ENCOUNTER — Ambulatory Visit: Payer: Self-pay | Attending: Internal Medicine

## 2014-05-08 ENCOUNTER — Telehealth: Payer: Self-pay | Admitting: *Deleted

## 2014-05-08 NOTE — Telephone Encounter (Signed)
Patient called in because she was confused about the names her medications.  She was given the generics for metformin and norvasc and I explained that it was the correct medication just the generic form.  She wanted to make sure that she wasn't taking the wrong medication.

## 2014-05-15 ENCOUNTER — Ambulatory Visit: Payer: Self-pay | Attending: Internal Medicine | Admitting: Internal Medicine

## 2014-05-15 ENCOUNTER — Encounter: Payer: Self-pay | Admitting: Internal Medicine

## 2014-05-15 VITALS — BP 138/86 | HR 72 | Temp 98.0°F | Resp 15 | Wt 195.0 lb

## 2014-05-15 DIAGNOSIS — R7309 Other abnormal glucose: Secondary | ICD-10-CM | POA: Insufficient documentation

## 2014-05-15 DIAGNOSIS — R7303 Prediabetes: Secondary | ICD-10-CM

## 2014-05-15 DIAGNOSIS — I10 Essential (primary) hypertension: Secondary | ICD-10-CM | POA: Insufficient documentation

## 2014-05-15 DIAGNOSIS — R569 Unspecified convulsions: Secondary | ICD-10-CM

## 2014-05-15 DIAGNOSIS — G40909 Epilepsy, unspecified, not intractable, without status epilepticus: Secondary | ICD-10-CM | POA: Insufficient documentation

## 2014-05-15 DIAGNOSIS — J302 Other seasonal allergic rhinitis: Secondary | ICD-10-CM | POA: Insufficient documentation

## 2014-05-15 DIAGNOSIS — Z90710 Acquired absence of both cervix and uterus: Secondary | ICD-10-CM | POA: Insufficient documentation

## 2014-05-15 DIAGNOSIS — Z9109 Other allergy status, other than to drugs and biological substances: Secondary | ICD-10-CM

## 2014-05-15 DIAGNOSIS — Z91048 Other nonmedicinal substance allergy status: Secondary | ICD-10-CM

## 2014-05-15 LAB — POCT GLYCOSYLATED HEMOGLOBIN (HGB A1C): HEMOGLOBIN A1C: 6.2

## 2014-05-15 LAB — GLUCOSE, POCT (MANUAL RESULT ENTRY): POC Glucose: 80 mg/dl (ref 70–99)

## 2014-05-15 MED ORDER — CETIRIZINE HCL 10 MG PO TABS: 10.0000 mg | ORAL_TABLET | Freq: Every day | ORAL | Status: DC

## 2014-05-15 NOTE — Progress Notes (Signed)
MRN: [id number] Name: [name]  Sex: female Age: 59 y.o. DOB: 1955-02-28  Allergies: Codeine; Banana; Orange fruit; Shrimp; and Tomato  Chief Complaint  Patient presents with  . Follow-up    HPI: Patient is 59 y.o. female who has history of hypertension, seizure disorder, prediabetes currently taking metformin, today noticed her hemoglobin A1c has trended up, she has not been following a low carbohydrate diet, she also has history of seizure disorder, she denies any recent seizures but she has lost follow up with the neurologist and she is requesting another referral. Currently denies any headache dizziness chest and shortness of breath.patient complains of environmental allergies and is requesting some medication to help.  Past Medical History  Diagnosis Date  . Hypertension   . Carpal tunnel syndrome   . Seizures     Past Surgical History  Procedure Laterality Date  . Abdominal hysterectomy    . Hernia repair    . Myomectomy        Medication List       This list is accurate as of: 05/15/14  2:19 PM.  Always use your most recent med list.               amLODipine 10 MG tablet  Commonly known as:  NORVASC  Take 1 tablet (10 mg total) by mouth daily.     cetirizine 10 MG tablet  Commonly known as:  ZYRTEC  Take 1 tablet (10 mg total) by mouth daily.     cholecalciferol 1000 UNITS tablet  Commonly known as:  VITAMIN D  Take 1,000 Units by mouth daily.     levETIRAcetam 500 MG tablet  Commonly known as:  KEPPRA  Take 2 tablets (1,000 mg total) by mouth 2 (two) times daily.     lisinopril-hydrochlorothiazide 20-12.5 MG per tablet  Commonly known as:  ZESTORETIC  Take 2 tablets by mouth daily.     metFORMIN 500 MG tablet  Commonly known as:  GLUCOPHAGE  Take 1 tablet (500 mg total) by mouth daily with breakfast.        Meds ordered this encounter  Medications  . DISCONTD: cetirizine (ZYRTEC) 10 MG tablet    Sig: Take 1 tablet (10 mg total) by  mouth daily.    Dispense:  30 tablet    Refill:  3  . cetirizine (ZYRTEC) 10 MG tablet    Sig: Take 1 tablet (10 mg total) by mouth daily.    Dispense:  30 tablet    Refill:  3    Immunization History  Administered Date(s) Administered  . Influenza,inj,Quad PF,36+ Mos 10/24/2013  . Td 01/19/1998    Family History  Problem Relation Age of Onset  . Asthma Mother   . Hypertension Father   . Heart disease Father   . Stroke Father   . Cancer Sister     uterus cancer   . Diabetes Maternal Uncle     History  Substance Use Topics  . Smoking status: Never Smoker   . Smokeless tobacco: Never Used  . Alcohol Use: No    Review of Systems   As noted in HPI  Filed Vitals:   05/15/14 1114  BP: 138/86  Pulse: 72  Temp: 98 F (36.7 C)  Resp: 15    Physical Exam  Physical Exam  Constitutional: No distress.  Eyes: EOM are normal. Pupils are equal, round, and reactive to light.  Cardiovascular: Normal rate and regular rhythm.   Pulmonary/Chest: Breath sounds normal.  No respiratory distress. She has no wheezes. She has no rales.  Musculoskeletal: She exhibits no edema.    CBC    Component Value Date/Time   WBC 5.9 10/05/2013 1255   RBC 4.57 10/05/2013 1255   HGB 12.2 10/05/2013 1255   HCT 36.7 10/05/2013 1255   PLT 265 10/05/2013 1255   MCV 80.3 10/05/2013 1255   LYMPHSABS 1.9 10/05/2013 1255   MONOABS 0.4 10/05/2013 1255   EOSABS 0.1 10/05/2013 1255   BASOSABS 0.0 10/05/2013 1255    CMP     Component Value Date/Time   NA 145 10/05/2013 1255   K 3.9 10/05/2013 1255   CL 108 10/05/2013 1255   CO2 24 10/05/2013 1255   GLUCOSE 85 10/05/2013 1255   BUN 15 10/05/2013 1255   CREATININE 0.88 10/05/2013 1255   CREATININE 0.90 09/06/2013 0915   CALCIUM 9.5 10/05/2013 1255   PROT 7.3 10/05/2013 1255   ALBUMIN 3.8 10/05/2013 1255   AST 29 10/05/2013 1255   ALT 20 10/05/2013 1255   ALKPHOS 85 10/05/2013 1255   BILITOT 0.3 10/05/2013 1255   GFRNONAA 72*  10/05/2013 1255   GFRNONAA 72 09/06/2013 0915   GFRAA 84* 10/05/2013 1255   GFRAA 83 09/06/2013 0915    Lab Results  Component Value Date/Time   CHOL 132 09/06/2013 09:15 AM    Lab Results  Component Value Date/Time   HGBA1C 6.20 05/15/2014 11:16 AM    Lab Results  Component Value Date/Time   AST 29 10/05/2013 12:55 PM    Assessment and Plan  Pre-diabetes - Plan:  Results for orders placed or performed in visit on 05/15/14  Glucose (CBG)  Result Value Ref Range   POC Glucose 80 70 - 99 mg/dl  HgB [id number]  Result Value Ref Range   Hemoglobin A1C 6.20    Advised patient for low carbohydrate, will repeat A1c in 3 months  HgB A1c, COMPLETE METABOLIC PANEL WITH GFR  Essential hypertension, benign - Plan: blood pressure is controlled continue with current meds, will check blood chemistry COMPLETE METABOLIC PANEL WITH GFR  Seizures - Plan:currently patient is on Keppra, Ambulatory referral to Neurology  Environmental allergies - Plan: cetirizine (ZYRTEC) 10 MG tablet,  Return in about 3 months (around 08/14/2014), or if symptoms worsen or fail to improve, for hypertension.   This note has been created with Education officer, environmentalDragon speech recognition software and smart phrase technology. Any transcriptional errors are unintentional.    Doris CheadleADVANI, Kery Batzel, MD

## 2014-05-15 NOTE — Progress Notes (Signed)
Patient here for follow up on her hypertension and seizure disorder and to check On her pre diabetes Patient states she does not need refills on her medications at this time

## 2014-05-15 NOTE — Patient Instructions (Addendum)
DASH Eating Plan DASH stands for "Dietary Approaches to Stop Hypertension." The DASH eating plan is a healthy eating plan that has been shown to reduce high blood pressure (hypertension). Additional health benefits may include reducing the risk of type 2 diabetes mellitus, heart disease, and stroke. The DASH eating plan may also help with weight loss. WHAT DO I NEED TO KNOW ABOUT THE DASH EATING PLAN? For the DASH eating plan, you will follow these general guidelines:  Choose foods with a percent daily value for sodium of less than 5% (as listed on the food label).  Use salt-free seasonings or herbs instead of table salt or sea salt.  Check with your health care provider or pharmacist before using salt substitutes.  Eat lower-sodium products, often labeled as "lower sodium" or "no salt added."  Eat fresh foods.  Eat more vegetables, fruits, and low-fat dairy products.  Choose whole grains. Look for the word "whole" as the first word in the ingredient list.  Choose fish and skinless chicken or turkey more often than red meat. Limit fish, poultry, and meat to 6 oz (170 g) each day.  Limit sweets, desserts, sugars, and sugary drinks.  Choose heart-healthy fats.  Limit cheese to 1 oz (28 g) per day.  Eat more home-cooked food and less restaurant, buffet, and fast food.  Limit fried foods.  Cook foods using methods other than frying.  Limit canned vegetables. If you do use them, rinse them well to decrease the sodium.  When eating at a restaurant, ask that your food be prepared with less salt, or no salt if possible. WHAT FOODS CAN I EAT? Seek help from a dietitian for individual calorie needs. Grains Whole grain or whole wheat bread. Brown rice. Whole grain or whole wheat pasta. Quinoa, bulgur, and whole grain cereals. Low-sodium cereals. Corn or whole wheat flour tortillas. Whole grain cornbread. Whole grain crackers. Low-sodium crackers. Vegetables Fresh or frozen vegetables  (raw, steamed, roasted, or grilled). Low-sodium or reduced-sodium tomato and vegetable juices. Low-sodium or reduced-sodium tomato sauce and paste. Low-sodium or reduced-sodium canned vegetables.  Fruits All fresh, canned (in natural juice), or frozen fruits. Meat and Other Protein Products Ground beef (85% or leaner), grass-fed beef, or beef trimmed of fat. Skinless chicken or turkey. Ground chicken or turkey. Pork trimmed of fat. All fish and seafood. Eggs. Dried beans, peas, or lentils. Unsalted nuts and seeds. Unsalted canned beans. Dairy Low-fat dairy products, such as skim or 1% milk, 2% or reduced-fat cheeses, low-fat ricotta or cottage cheese, or plain low-fat yogurt. Low-sodium or reduced-sodium cheeses. Fats and Oils Tub margarines without trans fats. Light or reduced-fat mayonnaise and salad dressings (reduced sodium). Avocado. Safflower, olive, or canola oils. Natural peanut or almond butter. Other Unsalted popcorn and pretzels. The items listed above may not be a complete list of recommended foods or beverages. Contact your dietitian for more options. WHAT FOODS ARE NOT RECOMMENDED? Grains White bread. White pasta. White rice. Refined cornbread. Bagels and croissants. Crackers that contain trans fat. Vegetables Creamed or fried vegetables. Vegetables in a cheese sauce. Regular canned vegetables. Regular canned tomato sauce and paste. Regular tomato and vegetable juices. Fruits Dried fruits. Canned fruit in light or heavy syrup. Fruit juice. Meat and Other Protein Products Fatty cuts of meat. Ribs, chicken wings, bacon, sausage, bologna, salami, chitterlings, fatback, hot dogs, bratwurst, and packaged luncheon meats. Salted nuts and seeds. Canned beans with salt. Dairy Whole or 2% milk, cream, half-and-half, and cream cheese. Whole-fat or sweetened yogurt. Full-fat   cheeses or blue cheese. Nondairy creamers and whipped toppings. Processed cheese, cheese spreads, or cheese  curds. Condiments Onion and garlic salt, seasoned salt, table salt, and sea salt. Canned and packaged gravies. Worcestershire sauce. Tartar sauce. Barbecue sauce. Teriyaki sauce. Soy sauce, including reduced sodium. Steak sauce. Fish sauce. Oyster sauce. Cocktail sauce. Horseradish. Ketchup and mustard. Meat flavorings and tenderizers. Bouillon cubes. Hot sauce. Tabasco sauce. Marinades. Taco seasonings. Relishes. Fats and Oils Butter, stick margarine, lard, shortening, ghee, and bacon fat. Coconut, palm kernel, or palm oils. Regular salad dressings. Other Pickles and olives. Salted popcorn and pretzels. The items listed above may not be a complete list of foods and beverages to avoid. Contact your dietitian for more information. WHERE CAN I FIND MORE INFORMATION? National Heart, Lung, and [employer] Institute: www.nhlbi.nih.gov/health/health-topics/topics/dash/ Document Released: 12/25/2010 Document Revised: 05/22/2013 Document Reviewed: 11/09/2012 ExitCare Patient Information 2015 [employer]. This information is not intended to replace advice given to you by your health care provider. Make sure you discuss any questions you have with your health care provider. Diabetes Mellitus and Food It is important for you to manage your blood sugar (glucose) level. Your blood glucose level can be greatly affected by what you eat. Eating healthier foods in the appropriate amounts throughout the day at about the same time each day will help you control your blood glucose level. It can also help slow or prevent worsening of your diabetes mellitus. Healthy eating may even help you improve the level of your blood pressure and reach or maintain a healthy weight.  HOW CAN FOOD AFFECT ME? Carbohydrates Carbohydrates affect your blood glucose level more than any other type of food. Your dietitian will help you determine how many carbohydrates to eat at each meal and teach you how to count carbohydrates. Counting  carbohydrates is important to keep your blood glucose at a healthy level, especially if you are using insulin or taking certain medicines for diabetes mellitus. Alcohol Alcohol can cause sudden decreases in blood glucose (hypoglycemia), especially if you use insulin or take certain medicines for diabetes mellitus. Hypoglycemia can be a life-threatening condition. Symptoms of hypoglycemia (sleepiness, dizziness, and disorientation) are similar to symptoms of having too much alcohol.  If your health care provider has given you approval to drink alcohol, do so in moderation and use the following guidelines:  Women should not have more than one drink per day, and men should not have more than two drinks per day. One drink is equal to:  12 oz of beer.  5 oz of wine.  1 oz of hard liquor.  Do not drink on an empty stomach.  Keep yourself hydrated. Have water, diet soda, or unsweetened iced tea.  Regular soda, juice, and other mixers might contain a lot of carbohydrates and should be counted. WHAT FOODS ARE NOT RECOMMENDED? As you make food choices, it is important to remember that all foods are not the same. Some foods have fewer nutrients per serving than other foods, even though they might have the same number of calories or carbohydrates. It is difficult to get your body what it needs when you eat foods with fewer nutrients. Examples of foods that you should avoid that are high in calories and carbohydrates but low in nutrients include:  Trans fats (most processed foods list trans fats on the Nutrition Facts label).  Regular soda.  Juice.  Candy.  Sweets, such as cake, pie, doughnuts, and cookies.  Fried foods. WHAT FOODS CAN I EAT? Have nutrient-rich foods,   which will nourish your body and keep you healthy. The food you should eat also will depend on several factors, including:  The calories you need.  The medicines you take.  Your weight.  Your blood glucose level.  Your  blood pressure level.  Your cholesterol level. You also should eat a variety of foods, including:  Protein, such as meat, poultry, fish, tofu, nuts, and seeds (lean animal proteins are best).  Fruits.  Vegetables.  Dairy products, such as milk, cheese, and yogurt (low fat is best).  Breads, grains, pasta, cereal, rice, and beans.  Fats such as olive oil, trans fat-free margarine, canola oil, avocado, and olives. DOES EVERYONE WITH DIABETES MELLITUS HAVE THE SAME MEAL PLAN? Because every person with diabetes mellitus is different, there is not one meal plan that works for everyone. It is very important that you meet with a dietitian who will help you create a meal plan that is just right for you. Document Released: 10/02/2004 Document Revised: 01/10/2013 Document Reviewed: 12/02/2012 ExitCare Patient Information 2015 [employer]. This information is not intended to replace advice given to you by your health care provider. Make sure you discuss any questions you have with your health care provider.  

## 2014-05-16 ENCOUNTER — Telehealth: Payer: Self-pay

## 2014-05-16 LAB — COMPLETE METABOLIC PANEL WITH GFR
ALK PHOS: 77 U/L (ref 39–117)
ALT: 14 U/L (ref 0–35)
AST: 21 U/L (ref 0–37)
Albumin: 4 g/dL (ref 3.5–5.2)
BUN: 9 mg/dL (ref 6–23)
CHLORIDE: 103 meq/L (ref 96–112)
CO2: 28 mEq/L (ref 19–32)
Calcium: 9.8 mg/dL (ref 8.4–10.5)
Creat: 0.77 mg/dL (ref 0.50–1.10)
GFR, Est African American: >89 mL/min
GFR, Est Non African American: 87 mL/min
Glucose, Bld: 73 mg/dL (ref 70–99)
Potassium: 3.5 mEq/L (ref 3.5–5.3)
Sodium: 141 mEq/L (ref 135–145)
Total Bilirubin: 0.5 mg/dL (ref 0.2–1.2)
Total Protein: 7.4 g/dL (ref 6.0–8.3)

## 2014-05-16 NOTE — Telephone Encounter (Signed)
-----   Message from Doris Cheadleeepak Advani, MD sent at 05/16/2014  9:52 AM EDT ----- Call and let the Patient know that blood work is normal.

## 2014-05-16 NOTE — Telephone Encounter (Signed)
Patient not available Left message on voice mail to return our call 

## 2014-06-04 ENCOUNTER — Ambulatory Visit: Payer: Self-pay | Attending: Internal Medicine

## 2014-06-04 ENCOUNTER — Ambulatory Visit: Payer: Self-pay

## 2014-06-13 ENCOUNTER — Ambulatory Visit: Payer: No Typology Code available for payment source | Admitting: Podiatry

## 2014-06-13 VITALS — BP 129/68 | HR 73 | Temp 97.6°F | Resp 16

## 2014-06-13 DIAGNOSIS — M205X1 Other deformities of toe(s) (acquired), right foot: Secondary | ICD-10-CM

## 2014-06-13 DIAGNOSIS — G629 Polyneuropathy, unspecified: Secondary | ICD-10-CM

## 2014-06-13 DIAGNOSIS — M2061 Acquired deformities of toe(s), unspecified, right foot: Secondary | ICD-10-CM

## 2014-06-17 NOTE — Progress Notes (Signed)
Patient ID: Kirsten RampRenee [name], female   DOB: 06/16/1955, 59 y.o.   MRN: [id number]  Subjective: [name] returns the office today for follow-up evaluation of right foot pain and neuropathy. She states that since last appointment she has been taking the vitamin B-12 she states that her neuropathy symptoms have improved. She states she also spoke to her neurologist in regards to this. She doesn't the inserts that were made for her last appointment to bother her feet however they did help at first. She denies any recent injury or trauma. No swelling or redness. She denies any acute changes since last appointment and no other complaints at this time. Denies any systemic complaints as fevers, chills, nausea, vomiting.  Objective: AAO 3, NAD DP/PT pulses palpable, CRT less than 3 seconds Protective sensation decreased with Simms Weinstein monofilament,  Achilles tendon reflex intact. There is no tenderness to palpation overlying the right first MTPJ. There is significant decrease in range of motion of the first MTPJ on the right. There is prominence of the sesamoids plantarly. There is a scar overlying the area from prior surgery which is well-healed. Hammertoe contractures are present. There is no other areas of tenderness to bilateral lower extremities. Decrease in medial arch height upon weightbearing bilaterally. Equinus bilaterally. No overlying edema, erythema, increase in warmth. MMT 4/5, ROM WNL except otherwise stated above. No open lesions or other pre-ulcerative lesions identified. No pain with calf compression, swelling, warmth, erythema.  Assessment: 59 year old female with right foot pain due to symptomatically flatfoot deformity, hallux limitus; neuropathy  Plan: -Treatment options were discussed the patient quit alternatives, risks, complications. -At today's appointment she was given a pair of accommodative inserts which are modified to help take pressure off the prominent sesamoid  plantarly. The break in period was discussed the patient. -Continue vitamins for neuropathy. Continue follow up with neurology. -Follow-up in 6 weeks or sooner if any problems are to arise. In the meantime, encouraged to call the office with any questions, concerns, changes symptoms.

## 2014-06-28 ENCOUNTER — Telehealth: Payer: Self-pay | Admitting: Internal Medicine

## 2014-06-28 DIAGNOSIS — Z0000 Encounter for general adult medical examination without abnormal findings: Secondary | ICD-10-CM

## 2014-06-28 NOTE — Telephone Encounter (Signed)
Patient has come in today to request a referral to Dentistry and opthalmology; patient has the [facility] card and 100% discount; Patient expressed that she does not have any income and would like to know if there are any options that will help her;  please f/u with patient about options;

## 2014-07-30 ENCOUNTER — Ambulatory Visit: Payer: No Typology Code available for payment source | Admitting: Podiatry

## 2014-07-30 VITALS — BP 116/73 | HR 75 | Resp 16

## 2014-07-30 DIAGNOSIS — M2061 Acquired deformities of toe(s), unspecified, right foot: Secondary | ICD-10-CM

## 2014-07-30 DIAGNOSIS — M205X1 Other deformities of toe(s) (acquired), right foot: Secondary | ICD-10-CM

## 2014-07-31 NOTE — Progress Notes (Signed)
Patient ID: Kirsten RampRenee [name], female   DOB: Feb 20, 1955, 59 y.o.   MRN: [id number]  Subjective: [name] returns the office today for follow-up evaluation of right foot pain and neuropathy. She has continue taking the B-12 which seems to help her neuropathies symptoms. She's been continuing the inserts that were given to her last appointment seemed to help. She believes that when the weather reaches above 90 his been most of her symptoms are to arise. She states that she is doing well. She denies any recent injury or trauma. No swelling or redness. She denies any acute changes since last appointment and no other complaints at this time. Denies any systemic complaints as fevers, chills, nausea, vomiting.  Objective: AAO 3, NAD DP/PT pulses palpable, CRT less than 3 seconds Protective sensation decreased with Simms Weinstein monofilament,  Achilles tendon reflex intact. There is no tenderness to palpation overlying the right first MTPJ. There is significant decrease in range of motion of the first MTPJ on the right. There is prominence of the sesamoids plantarly. With a very small hyperkeratotic lesion along the medial sesamoid plantarly. There is a scar overlying the area from prior surgery which is well-healed. Hammertoe contractures are present. There is no other areas of tenderness to bilateral lower extremities. Decrease in medial arch height upon weightbearing bilaterally. Equinus bilaterally. No overlying edema, erythema, increase in warmth.  No open lesions or other pre-ulcerative lesions identified. No pain with calf compression, swelling, warmth, erythema.  Assessment: 59 year old female with right foot pain due to symptomatically flatfoot deformity, hallux limitus, prominent sesamoid; neuropathy  Plan: -Treatment options were discussed the patient quit alternatives, risks, complications. -Orthotics were modified today to help offload the prominent sesamoids -Continue vitamins for  neuropathy. Continue follow up with neurology. -Follow-up as needed or sooner if any problems are to arise. In the meantime, encouraged to call the office with any questions, concerns, changes symptoms.  Ovid CurdMatthew Wagoner, DPM

## 2014-08-17 ENCOUNTER — Ambulatory Visit: Payer: Self-pay | Attending: Internal Medicine | Admitting: Internal Medicine

## 2014-08-17 ENCOUNTER — Encounter: Payer: Self-pay | Admitting: Internal Medicine

## 2014-08-17 VITALS — BP 120/78 | HR 71 | Temp 97.8°F | Resp 16 | Wt 197.0 lb

## 2014-08-17 DIAGNOSIS — R7309 Other abnormal glucose: Secondary | ICD-10-CM | POA: Insufficient documentation

## 2014-08-17 DIAGNOSIS — R7303 Prediabetes: Secondary | ICD-10-CM

## 2014-08-17 DIAGNOSIS — I10 Essential (primary) hypertension: Secondary | ICD-10-CM

## 2014-08-17 DIAGNOSIS — Z79899 Other long term (current) drug therapy: Secondary | ICD-10-CM | POA: Insufficient documentation

## 2014-08-17 DIAGNOSIS — R569 Unspecified convulsions: Secondary | ICD-10-CM

## 2014-08-17 LAB — COMPLETE METABOLIC PANEL WITH GFR
ALBUMIN: 4.3 g/dL (ref 3.6–5.1)
ALK PHOS: 89 U/L (ref 33–130)
ALT: 14 U/L (ref 6–29)
AST: 19 U/L (ref 10–35)
BUN: 13 mg/dL (ref 7–25)
CHLORIDE: 98 mmol/L (ref 98–110)
CO2: 26 mmol/L (ref 20–31)
Calcium: 9.9 mg/dL (ref 8.6–10.4)
Creat: 0.88 mg/dL (ref 0.50–1.05)
GFR, EST AFRICAN AMERICAN: 84 mL/min (ref >=60)
GFR, Est Non African American: 73 mL/min (ref >=60)
Glucose, Bld: 81 mg/dL (ref 65–99)
POTASSIUM: 3.9 mmol/L (ref 3.5–5.3)
Sodium: 136 mmol/L (ref 135–146)
Total Bilirubin: 0.4 mg/dL (ref 0.2–1.2)
Total Protein: 7.4 g/dL (ref 6.1–8.1)

## 2014-08-17 LAB — GLUCOSE, POCT (MANUAL RESULT ENTRY): POC GLUCOSE: 97 mg/dL (ref 70–99)

## 2014-08-17 LAB — POCT GLYCOSYLATED HEMOGLOBIN (HGB A1C): Hemoglobin A1C: 6

## 2014-08-17 MED ORDER — LISINOPRIL-HYDROCHLOROTHIAZIDE 20-12.5 MG PO TABS: 2.0000 | ORAL_TABLET | Freq: Every day | ORAL | Status: DC

## 2014-08-17 MED ORDER — AMLODIPINE BESYLATE 10 MG PO TABS: 10.0000 mg | ORAL_TABLET | Freq: Every day | ORAL | Status: DC

## 2014-08-17 MED ORDER — METFORMIN HCL 500 MG PO TABS: 500.0000 mg | ORAL_TABLET | Freq: Every day | ORAL | Status: DC

## 2014-08-17 NOTE — Progress Notes (Signed)
Patient here for follow up on her diabetes and blood pressure Patient also needs refills on her medications

## 2014-08-17 NOTE — Progress Notes (Signed)
MRN: [id number] Name: Genesee Nase  Sex: female Age: 59 y.o. DOB: Oct 10, 1955  Allergies: Codeine; Banana; Orange fruit; Catfish; Shrimp; and Tomato  Chief Complaint  Patient presents with  . Follow-up    HPI: Patient is 59 y.o. female who has history of hypertension, seizure, prediabetes comes today for followup, she denies any acute symptoms denies any headache dizziness chest and shortness of breath or any recent seizures, she's requesting refill on her meds.she has been taking her metformin and her hemoglobin A1c is improved.  Past Medical History  Diagnosis Date  . Hypertension   . Carpal tunnel syndrome   . Seizures     Past Surgical History  Procedure Laterality Date  . Abdominal hysterectomy    . Hernia repair    . Myomectomy        Medication List       This list is accurate as of: 08/17/14 10:53 AM.  Always use your most recent med list.               amLODipine 10 MG tablet  Commonly known as:  NORVASC  Take 1 tablet (10 mg total) by mouth daily.     cetirizine 10 MG tablet  Commonly known as:  ZYRTEC  Take 1 tablet (10 mg total) by mouth daily.     cholecalciferol 1000 UNITS tablet  Commonly known as:  VITAMIN D  Take 1,000 Units by mouth daily.     levETIRAcetam 500 MG tablet  Commonly known as:  KEPPRA  Take 2 tablets (1,000 mg total) by mouth 2 (two) times daily.     lisinopril-hydrochlorothiazide 20-12.5 MG per tablet  Commonly known as:  ZESTORETIC  Take 2 tablets by mouth daily.     metFORMIN 500 MG tablet  Commonly known as:  GLUCOPHAGE  Take 1 tablet (500 mg total) by mouth daily with breakfast.        Meds ordered this encounter  Medications  . amLODipine (NORVASC) 10 MG tablet    Sig: Take 1 tablet (10 mg total) by mouth daily.    Dispense:  90 tablet    Refill:  3  . lisinopril-hydrochlorothiazide (ZESTORETIC) 20-12.5 MG per tablet    Sig: Take 2 tablets by mouth daily.    Dispense:  180 tablet    Refill:  3  .  metFORMIN (GLUCOPHAGE) 500 MG tablet    Sig: Take 1 tablet (500 mg total) by mouth daily with breakfast.    Dispense:  90 tablet    Refill:  3    Immunization History  Administered Date(s) Administered  . Influenza,inj,Quad PF,36+ Mos 10/24/2013  . Td 01/19/1998    Family History  Problem Relation Age of Onset  . Asthma Mother   . Hypertension Father   . Heart disease Father   . Stroke Father   . Cancer Sister     uterus cancer   . Diabetes Maternal Uncle     History  Substance Use Topics  . Smoking status: Never Smoker   . Smokeless tobacco: Never Used  . Alcohol Use: No    Review of Systems   As noted in HPI  Filed Vitals:   08/17/14 0934  BP: 120/78  Pulse: 71  Temp: 97.8 F (36.6 C)  Resp: 16    Physical Exam  Physical Exam  Constitutional: No distress.  Eyes: EOM are normal. Pupils are equal, round, and reactive to light.  Cardiovascular: Normal rate and regular rhythm.   Pulmonary/Chest: Breath  sounds normal. No respiratory distress. She has no wheezes. She has no rales.  Musculoskeletal: She exhibits no edema.    Labs   Lab Results  Component Value Date   WBC 5.9 10/05/2013   HGB 12.2 10/05/2013   HCT 36.7 10/05/2013   PLT 265 10/05/2013   GLUCOSE 73 05/15/2014   CHOL 132 09/06/2013   TRIG 75 09/06/2013   HDL 52 09/06/2013   LDLCALC 65 09/06/2013   ALT 14 05/15/2014   AST 21 05/15/2014   NA 141 05/15/2014   K 3.5 05/15/2014   CL 103 05/15/2014   CREATININE 0.77 05/15/2014   BUN 9 05/15/2014   CO2 28 05/15/2014   TSH 1.637 09/06/2013   HGBA1C 6.0 08/17/2014    Lab Results  Component Value Date   HGBA1C 6.0 08/17/2014   HGBA1C 6.20 05/15/2014   HGBA1C 5.70 01/30/2014     Assessment and Plan  Prediabetes - Plan:  Results for orders placed or performed in visit on 08/17/14  Glucose (CBG)  Result Value Ref Range   POC Glucose 97.0 70 - 99 mg/dl  HgB Z6X  Result Value Ref Range   Hemoglobin A1C 6.0     Hemoglobin A1c  has trended down, continue with dietary modification, continue with metformin, metFORMIN (GLUCOPHAGE) 500 MG tablet, COMPLETE METABOLIC PANEL WITH GFR  Essential hypertension, benign - Plan: blood pressure is controlled continue with current meds, advised patient for DASH diet ,amLODipine (NORVASC) 10 MG tablet, lisinopril-hydrochlorothiazide (ZESTORETIC) 20-12.5 MG per tablet, COMPLETE METABOLIC PANEL WITH GFR  Seizures No recent seizures continue with Keppra.    Return in about 3 months (around 11/17/2014), or if symptoms worsen or fail to improve.   This note has been created with Education officer, environmental. Any transcriptional errors are unintentional.    Doris Cheadle, MD

## 2014-08-17 NOTE — Patient Instructions (Signed)
Diabetes Mellitus and Food It is important for you to manage your blood sugar (glucose) level. Your blood glucose level can be greatly affected by what you eat. Eating healthier foods in the appropriate amounts throughout the day at about the same time each day will help you control your blood glucose level. It can also help slow or prevent worsening of your diabetes mellitus. Healthy eating may even help you improve the level of your blood pressure and reach or maintain a healthy weight.  HOW CAN FOOD AFFECT ME? Carbohydrates Carbohydrates affect your blood glucose level more than any other type of food. Your dietitian will help you determine how many carbohydrates to eat at each meal and teach you how to count carbohydrates. Counting carbohydrates is important to keep your blood glucose at a healthy level, especially if you are using insulin or taking certain medicines for diabetes mellitus. Alcohol Alcohol can cause sudden decreases in blood glucose (hypoglycemia), especially if you use insulin or take certain medicines for diabetes mellitus. Hypoglycemia can be a life-threatening condition. Symptoms of hypoglycemia (sleepiness, dizziness, and disorientation) are similar to symptoms of having too much alcohol.  If your health care provider has given you approval to drink alcohol, do so in moderation and use the following guidelines:  Women should not have more than one drink per day, and men should not have more than two drinks per day. One drink is equal to:  12 oz of beer.  5 oz of wine.  1 oz of hard liquor.  Do not drink on an empty stomach.  Keep yourself hydrated. Have water, diet soda, or unsweetened iced tea.  Regular soda, juice, and other mixers might contain a lot of carbohydrates and should be counted. WHAT FOODS ARE NOT RECOMMENDED? As you make food choices, it is important to remember that all foods are not the same. Some foods have fewer nutrients per serving than other  foods, even though they might have the same number of calories or carbohydrates. It is difficult to get your body what it needs when you eat foods with fewer nutrients. Examples of foods that you should avoid that are high in calories and carbohydrates but low in nutrients include:  Trans fats (most processed foods list trans fats on the Nutrition Facts label).  Regular soda.  Juice.  Candy.  Sweets, such as cake, pie, doughnuts, and cookies.  Fried foods. WHAT FOODS CAN I EAT? Have nutrient-rich foods, which will nourish your body and keep you healthy. The food you should eat also will depend on several factors, including:  The calories you need.  The medicines you take.  Your weight.  Your blood glucose level.  Your blood pressure level.  Your cholesterol level. You also should eat a variety of foods, including:  Protein, such as meat, poultry, fish, tofu, nuts, and seeds (lean animal proteins are best).  Fruits.  Vegetables.  Dairy products, such as milk, cheese, and yogurt (low fat is best).  Breads, grains, pasta, cereal, rice, and beans.  Fats such as olive oil, trans fat-free margarine, canola oil, avocado, and olives. DOES EVERYONE WITH DIABETES MELLITUS HAVE THE SAME MEAL PLAN? Because every person with diabetes mellitus is different, there is not one meal plan that works for everyone. It is very important that you meet with a dietitian who will help you create a meal plan that is just right for you. Document Released: 10/02/2004 Document Revised: 01/10/2013 Document Reviewed: 12/02/2012 ExitCare Patient Information 2015 [employer]. This   information is not intended to replace advice given to you by your health care provider. Make sure you discuss any questions you have with your health care provider.  

## 2014-08-20 ENCOUNTER — Telehealth: Payer: Self-pay

## 2014-08-20 NOTE — Telephone Encounter (Signed)
-----   Message from Deepak Advani, MD sent at 08/20/2014  9:54 AM EDT ----- Call and let the Patient know that blood work is normal.  

## 2014-08-20 NOTE — Telephone Encounter (Signed)
Patient is aware of her lab results 

## 2014-10-03 ENCOUNTER — Ambulatory Visit: Payer: Self-pay | Attending: Internal Medicine

## 2014-11-20 ENCOUNTER — Encounter: Payer: Self-pay | Admitting: Internal Medicine

## 2014-11-20 ENCOUNTER — Ambulatory Visit: Payer: Self-pay | Attending: Internal Medicine | Admitting: Internal Medicine

## 2014-11-20 ENCOUNTER — Encounter (HOSPITAL_BASED_OUTPATIENT_CLINIC_OR_DEPARTMENT_OTHER): Payer: Self-pay | Admitting: Clinical

## 2014-11-20 VITALS — BP 115/74 | HR 63 | Temp 98.0°F | Resp 16 | Wt 198.4 lb

## 2014-11-20 DIAGNOSIS — I10 Essential (primary) hypertension: Secondary | ICD-10-CM | POA: Insufficient documentation

## 2014-11-20 DIAGNOSIS — E119 Type 2 diabetes mellitus without complications: Secondary | ICD-10-CM | POA: Insufficient documentation

## 2014-11-20 DIAGNOSIS — G40909 Epilepsy, unspecified, not intractable, without status epilepticus: Secondary | ICD-10-CM | POA: Insufficient documentation

## 2014-11-20 DIAGNOSIS — Z659 Problem related to unspecified psychosocial circumstances: Secondary | ICD-10-CM

## 2014-11-20 DIAGNOSIS — G4700 Insomnia, unspecified: Secondary | ICD-10-CM | POA: Insufficient documentation

## 2014-11-20 LAB — POCT GLYCOSYLATED HEMOGLOBIN (HGB A1C): Hemoglobin A1C: 5.8

## 2014-11-20 LAB — GLUCOSE, POCT (MANUAL RESULT ENTRY): POC GLUCOSE: 88 mg/dL (ref 70–99)

## 2014-11-20 MED ORDER — AMLODIPINE BESYLATE 10 MG PO TABS: 10.0000 mg | ORAL_TABLET | Freq: Every day | ORAL | Status: DC

## 2014-11-20 MED ORDER — LISINOPRIL-HYDROCHLOROTHIAZIDE 20-12.5 MG PO TABS: 2.0000 | ORAL_TABLET | Freq: Every day | ORAL | Status: DC

## 2014-11-20 MED ORDER — METFORMIN HCL 500 MG PO TABS: 500.0000 mg | ORAL_TABLET | Freq: Every day | ORAL | Status: DC
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
## 2014-11-20 NOTE — Progress Notes (Signed)
Patient states she is here for follow up on her HTN and seizures and  To meet her new provider

## 2014-11-20 NOTE — Progress Notes (Signed)
Patient ID: [name], female   DOB: [birth year], 59 y.o.   MRN: [id number] Subjective:  [name] is a 59 y.o. female with hypertension, diabetes, and seizures. She states that last month she had 5 days of blackouts with shakes at home that are not characteristic of her normal seizures. She is concerned because the Keppra pills looked different that where mailed to her from the drug company. She notes that she has been more stressed because she found out that her daughter is moving back to [place] to be in a abusive relationship and thinks that may be triggering her seizures. She reports that events have happened in the shower causing her to hit her head but she did not lose consciousness. She has recently been having difficulty sleeping as well.  She reports that she takes her diabetes medications daily and has no complaints woth medications. She does have peripheral neuropathy and was given Vitamin B12 by her podiatrist.   Current Outpatient Prescriptions  Medication Sig Dispense Refill  . amLODipine (NORVASC) 10 MG tablet Take 1 tablet (10 mg total) by mouth daily. 90 tablet 3  . cholecalciferol (VITAMIN D) 1000 UNITS tablet Take 1,000 Units by mouth daily.    . Cyanocobalamin (B-12 PO) Take by mouth.    . levETIRAcetam (KEPPRA) 500 MG tablet Take 2 tablets (1,000 mg total) by mouth 2 (two) times daily. 60 tablet 0  . lisinopril-hydrochlorothiazide (ZESTORETIC) 20-12.5 MG per tablet Take 2 tablets by mouth daily. 180 tablet 3  . metFORMIN (GLUCOPHAGE) 500 MG tablet Take 1 tablet (500 mg total) by mouth daily with breakfast. 90 tablet 3  . cetirizine (ZYRTEC) 10 MG tablet Take 1 tablet (10 mg total) by mouth daily. 30 tablet 3   No current facility-administered medications for this visit.    Hypertension ROS: taking medications as instructed, no medication side effects noted, no TIA's, no chest pain on exertion, no dyspnea on exertion, no swelling of ankles and no palpitations. All  other systems negative that were not previously stated.   Objective:  BP 115/74 mmHg  Pulse 63  Temp(Src) 98 F (36.7 C)  Resp 16  Wt 198 lb 6.4 oz (89.994 kg)  SpO2 100%  Physical Exam  Constitutional: She is oriented to person, place, and time.  Neck: Normal range of motion.  Cardiovascular: Normal rate, regular rhythm and normal heart sounds.   Pulmonary/Chest: Effort normal and breath sounds normal.  Abdominal: Soft. Bowel sounds are normal.  Musculoskeletal: She exhibits no edema.  Feet:  Right Foot:  Skin Integrity: Negative for skin breakdown.  Left Foot:  Skin Integrity: Negative for skin breakdown.  Neurological: She is alert and oriented to person, place, and time. A cranial nerve deficit (left sided facial droop. All other normal.) is present.  Psychiatric: She has a normal mood and affect.     Demesha was seen today for follow-up.  Diagnoses and all orders for this visit:  Type 2 diabetes mellitus without complication, without long-term current use of insulin (HCC) -     Glucose (CBG) -     HgB A1c -     Flu Vaccine QUAD 36+ mos PF IM (Fluarix & Fluzone Quad PF) -     metFORMIN (GLUCOPHAGE) 500 MG tablet; Take 1 tablet (500 mg total) by mouth daily with breakfast. -     Amb Referral to Nutrition and Diabetic E Patients diabetes is well control as evidence by consistently low a1c.  Patient will continue with current therapy and  continue to make necessary lifestyle changes.  Reviewed foot care, diet, exercise, annual health maintenance with patient.   Essential hypertension, benign -     amLODipine (NORVASC) 10 MG tablet; Take 1 tablet (10 mg total) by mouth daily. -     lisinopril-hydrochlorothiazide (ZESTORETIC) 20-12.5 MG tablet; Take 2 tablets by mouth daily. Patient blood pressure is stable and may continue on current medication.  Education on diet, exercise, and modifiable risk factors discussed. Will obtain appropriate labs as needed. Will follow up in 3-6  months.   Seizure disorder [facility](HCC) -     Ambulatory referral to Neurology Will refer for further management. Patient states facial droop is baseline I stressed to patient that she has to control her stress because that is likely a trigger for her symptoms. Once stress is controlled she may have improvement in sleep. Advised some counseling and referred patient to LCSW.  Explained signs and symptoms that should warrant immediate attention.  Patient verbalized understanding with teach back used.  Insomnia See above. Sleep hygiene discussed.   Return in about 6 months (around 05/20/2015) for DM/HTN.   Ambrose FinlandValerie A Keck, NP 11/21/2014 9:55 AM

## 2014-11-28 ENCOUNTER — Ambulatory Visit (INDEPENDENT_AMBULATORY_CARE_PROVIDER_SITE_OTHER): Payer: Self-pay | Admitting: Neurology

## 2014-11-28 ENCOUNTER — Encounter: Payer: Self-pay | Admitting: Neurology

## 2014-11-28 VITALS — BP 128/84 | HR 65 | Resp 14 | Wt 204.0 lb

## 2014-11-28 DIAGNOSIS — R569 Unspecified convulsions: Secondary | ICD-10-CM

## 2014-11-28 NOTE — Progress Notes (Signed)
NEUROLOGY CONSULTATION NOTE  Veverly Scobey MRN: [id number] DOB: [birth year]  Referring provider: Holland Commons, NP Primary care provider: Holland Commons, NP  Reason for consult:  seizures  Thank you for your kind referral of Yamina Lenis for consultation of the above symptoms. Although her history is well known to you, please allow me to reiterate it for the purpose of our medical record. Records and images were personally reviewed where available.  HISTORY OF PRESENT ILLNESS: This is a 59 year old right-handed woman with a history of hypertension, presenting to establish care for a diagnosis of seizures. She reports that neurological symptoms started in 2005 when she would have involuntary movements of the right leg. She states she had lost her grandmother and this had an effect on her, and had symptoms since then. She had seen neurologist Dr. Aletha Halim at that time and was diagnosed with focal limb dystomia, told that this may be caused by a chemical imbalance. She was treated with Botox but did not tolerate it. She reports seeing Dr. Estella Husk from 2006 to 2009, but also at the same time she started having blackouts. She reports that she had a blackout in her doctor's office one time, but instead of being brought to [facility] ER, she was brought to [facility]. She has had blackouts so bad that neighbors would tell her she needed to go to the ER. She usually starts feeling pain in both legs, followed by right-sided shaking, numbness on the left side of her face, throat gets tight, then she falls to the floor. She lives by herself and has woken up on the floor. She reports that she can see herself shaking and would go into a fetal position to "ride it out."   In August 2015, she was brought to the ER due to recent increase in frequency of spells. She reported bilateral numbness and tingling as well as a feeling of discomfort in both upper and lower extremities. She has been told  she has convulsions involving her legs. She reported urinary incontinence and tongue soreness. Her BP was very high at that time. 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 No definite gliosis of heterotopic gray matter. Her routine EEG was normal. She was discharged home on Keppra  BID which she has been tolerating without side effects. She reports doing well with no similar symptoms until the past month. She brings a calendar of her events, She had one last 10/11. She reports she had been stressed because her daughter told her that day that she is moving to [place] to what her mother thinks is an unhealthy relationship. She had another episode on 10/30 while walking. On 11/6 she had 2 episodes, one lasting 12 minutes, another one lasting an hour. She reports being in the shower, she grabbed the handle and sat in the tub rocking in a fetal position, which she found helps. She believes stress is causing the recent seizures and talking to a therapist does help her. She states she does not know how to relax.  She denies any significant headaches except when she blacked out in October. She has some blurred vision and will be seeing an eye doctor next month. She has left jaw pain on chewing. She also reports neck pain. No back pain, bowel/bladder dysfunction. 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 She reports feeling anxious, denies feeling depressed. She gets nervous around crowds. 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 There is no history of febrile convulsions, CNS infections such as meningitis/encephalitis, significant traumatic brain injury,  neurosurgical procedures, or family history of seizures.  PAST MEDICAL HISTORY: Past Medical History  Diagnosis Date  . Hypertension   . Carpal tunnel syndrome   . Seizures (HCC)     PAST SURGICAL HISTORY: Past Surgical History  Procedure Laterality Date  . Abdominal hysterectomy    . Hernia repair    . Myomectomy      MEDICATIONS: Current Outpatient Prescriptions on File Prior to Visit  Medication Sig Dispense Refill  . amLODipine (NORVASC) 10 MG tablet Take 1 tablet (10 mg total) by mouth daily. 90 tablet 3  . Cyanocobalamin (B-12 PO) Take by mouth.    . levETIRAcetam (KEPPRA) 500 MG tablet Take 2 tablets (1,000 mg total) by mouth 2 (two) times daily. 60 tablet 0  . lisinopril-hydrochlorothiazide (ZESTORETIC) 20-12.5 MG tablet Take 2 tablets by mouth daily. 180 tablet 3  . metFORMIN (GLUCOPHAGE) 500 MG tablet Take 1 tablet (500 mg total) by mouth daily with breakfast. 90 tablet 3  . cholecalciferol (VITAMIN D) 1000 UNITS tablet Take 1,000 Units by mouth daily.     No current facility-administered medications on file prior to visit.    ALLERGIES: Allergies  Allergen Reactions  . Codeine Anaphylaxis  . Banana     Cannot eat any while taking KEPPRA  . Orange Fruit [Citrus]     Cannot have while taking KEPPRA  . Catfish [Fish Allergy] Rash  . Shrimp [Shellfish Allergy] Nausea And Vomiting and Rash    SHRIMP ONLY  . Tomato Itching and Rash    FAMILY HISTORY: Family History  Problem Relation Age of Onset  . Asthma Mother   . Hypertension Father   . Heart disease Father   . Stroke Father   . Cancer Sister     uterus cancer   . Diabetes Maternal Uncle     SOCIAL HISTORY: Social History   Social History  . Marital Status: Divorced    Spouse Name: N/A  . Number of Children: 1  . Years of Education: N/A   Occupational History  . Not on file.   Social History Main Topics  . Smoking status: Never Smoker   . Smokeless tobacco: Never Used  . Alcohol Use: No    . Drug Use: No  . Sexual Activity: Not on file   Other Topics Concern  . Not on file   Social History Narrative    REVIEW OF SYSTEMS: Constitutional: No fevers, chills, or sweats, no generalized fatigue, change in appetite Eyes: as above Ear, nose and throat: No hearing loss, ear pain, nasal congestion, sore throat Cardiovascular: No chest pain, palpitations Respiratory:  No shortness of breath at rest or with exertion, wheezes GastrointestinaI: No nausea, vomiting, diarrhea, abdominal pain, fecal incontinence Genitourinary:  No dysuria, urinary retention or frequency Musculoskeletal:  No neck pain, back pain Integumentary: No rash, pruritus, skin lesions Neurological: as above Psychiatric: No depression, insomnia, +anxiety Endocrine: No palpitations, fatigue, diaphoresis, mood swings, change in appetite, change in weight, increased thirst Hematologic/Lymphatic:  No anemia, purpura, petechiae. Allergic/Immunologic: no itchy/runny eyes, nasal congestion, recent allergic reactions, rashes  PHYSICAL EXAM: Filed Vitals:   11/28/14 1247  BP: 128/84  Pulse: 65  Resp: 14   General: No acute distress, somewhat child-like affect Head:  Normocephalic/atraumatic Eyes: Fundoscopic exam shows bilateral sharp discs, no vessel changes, exudates, or hemorrhages Neck: supple, no paraspinal tenderness, full range of motion Back: No paraspinal tenderness Heart: regular rate and rhythm Lungs: Clear to auscultation bilaterally. Vascular: No carotid bruits. Skin/Extremities: No rash, no edema Neurological Exam: Mental status: alert and oriented to person, place, and time, no dysarthria or aphasia, Fund of knowledge is appropriate.  Recent and remote memory are intact.  Attention and concentration are normal.    Able to name objects and repeat phrases. Cranial nerves: CN I: not tested CN II: pupils equal, round and reactive to light, visual fields intact, fundi unremarkable. CN III, IV, VI:   full range of motion with left esotropia, no nystagmus, no ptosis CN V: decreased pin on right V1 distribution CN VII: asymmetric smile on the left side (noted on previous hospital visits) CN VIII: hearing intact to finger rub CN IX, X: gag intact, uvula midline CN XI: sternocleidomastoid and trapezius muscles intact CN XII: tongue midline Bulk & Tone: normal, no fasciculations. Motor: 5/5 throughout with no pronator drift. Sensation: decreased pin and vibration on right UE and LE, intact cold and joint position sense. Romberg test negative Deep Tendon Reflexes: brisk +2 throughout, no ankle clonus Plantar responses: downgoing bilaterally Cerebellar: no incoordination on finger to nose, heel to shin. No dysdiadochokinesia Gait: narrow-based and steady, able to tandem walk adequately. Tremor: none  IMPRESSION: This is a 59 year old right-handed woman with a history of hypertension and recurrent episodes of loss of consciousness with report of right-sided symptoms initially, but recently symptoms are preceded by pain and discomfort in her extremities (LE>UE). 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 She however reports that she had been event-free for a year since starting Keppra, until last month when her daughter told her she is moving to [place] (again raising concern for PNES). At this point, would continue Keppra  BID and agree with better stress management techniques to help deal with current stress, continue therapy sessions. If seizures continue despite this, she will be a good candidate for video EEG monitoring to classify seizures. She does not  drive. She will follow-up in 6 months or earlier if needed.   Thank you for allowing me to participate in the care of this patient. Please do not hesitate to call for any questions or concerns.   Patrcia Dolly, M.D.

## 2014-11-28 NOTE — Patient Instructions (Signed)
1. Continue Keppra XR 500mg : Take 2 tablets twice a day 2. Agree with better stress management to help with the stress seizures 3. Follow-up in 6 months

## 2014-12-05 DIAGNOSIS — R569 Unspecified convulsions: Secondary | ICD-10-CM | POA: Insufficient documentation

## 2015-01-16 ENCOUNTER — Ambulatory Visit: Payer: Self-pay | Attending: Internal Medicine | Admitting: Internal Medicine

## 2015-01-16 ENCOUNTER — Encounter: Payer: Self-pay | Admitting: Internal Medicine

## 2015-01-16 VITALS — BP 135/80 | HR 73 | Temp 98.0°F | Resp 16 | Ht 62.0 in | Wt 209.8 lb

## 2015-01-16 DIAGNOSIS — Z91018 Allergy to other foods: Secondary | ICD-10-CM | POA: Insufficient documentation

## 2015-01-16 DIAGNOSIS — Z885 Allergy status to narcotic agent status: Secondary | ICD-10-CM | POA: Insufficient documentation

## 2015-01-16 DIAGNOSIS — G5600 Carpal tunnel syndrome, unspecified upper limb: Secondary | ICD-10-CM | POA: Insufficient documentation

## 2015-01-16 DIAGNOSIS — Z7984 Long term (current) use of oral hypoglycemic drugs: Secondary | ICD-10-CM | POA: Insufficient documentation

## 2015-01-16 DIAGNOSIS — I10 Essential (primary) hypertension: Secondary | ICD-10-CM | POA: Insufficient documentation

## 2015-01-16 DIAGNOSIS — J0110 Acute frontal sinusitis, unspecified: Secondary | ICD-10-CM | POA: Insufficient documentation

## 2015-01-16 DIAGNOSIS — G40909 Epilepsy, unspecified, not intractable, without status epilepticus: Secondary | ICD-10-CM | POA: Insufficient documentation

## 2015-01-16 DIAGNOSIS — Z91013 Allergy to seafood: Secondary | ICD-10-CM | POA: Insufficient documentation

## 2015-01-16 MED ORDER — AMOXICILLIN-POT CLAVULANATE 875-125 MG PO TABS: 1.0000 | ORAL_TABLET | Freq: Two times a day (BID) | ORAL | Status: DC

## 2015-01-16 NOTE — Progress Notes (Signed)
Patient ID: [name], female   DOB: 07/04/1955, 59 y.o.   MRN: [id number]  CC: cough  HPI: [name] is a 59 y.o. female here today for a follow up visit.  Patient has past medical history of hypertension, seizure disorder, and carpal tunnel syndrome. Patient reports that she has been having symptoms of nasal congestion, cough, rhinitis, and sinus pain for the past week. She has tried OTC tylenol cold and nasal spray without relief. She reports thick yellow mucous discharge. She denies fevers, chills, nausea, vomiting.   Allergies  Allergen Reactions  . Codeine Anaphylaxis  . Banana     Cannot eat any while taking KEPPRA  . Orange Fruit [Citrus]     Cannot have while taking KEPPRA  . Catfish [Fish Allergy] Rash  . Shrimp [Shellfish Allergy] Nausea And Vomiting and Rash    SHRIMP ONLY  . Tomato Itching and Rash   Past Medical History  Diagnosis Date  . Hypertension   . Carpal tunnel syndrome   . Seizures (HCC)    Current Outpatient Prescriptions on File Prior to Visit  Medication Sig Dispense Refill  . amLODipine (NORVASC) 10 MG tablet Take 1 tablet (10 mg total) by mouth daily. 90 tablet 3  . cholecalciferol (VITAMIN D) 1000 UNITS tablet Take 1,000 Units by mouth daily.    . Cyanocobalamin (B-12 PO) Take by mouth.    . levETIRAcetam (KEPPRA) 500 MG tablet Take 2 tablets (1,000 mg total) by mouth 2 (two) times daily. 60 tablet 0  . lisinopril-hydrochlorothiazide (ZESTORETIC) 20-12.5 MG tablet Take 2 tablets by mouth daily. 180 tablet 3  . metFORMIN (GLUCOPHAGE) 500 MG tablet Take 1 tablet (500 mg total) by mouth daily with breakfast. 90 tablet 3   No current facility-administered medications on file prior to visit.   Family History  Problem Relation Age of Onset  . Asthma Mother   . Hypertension Father   . Heart disease Father   . Stroke Father   . Cancer Sister     uterus cancer   . Diabetes Maternal Uncle    Social History   Social History  . Marital  Status: Divorced    Spouse Name: N/A  . Number of Children: 1  . Years of Education: N/A   Occupational History  . Not on file.   Social History Main Topics  . Smoking status: Never Smoker   . Smokeless tobacco: Never Used  . Alcohol Use: No  . Drug Use: No  . Sexual Activity: Not on file   Other Topics Concern  . Not on file   Social History Narrative    Review of Systems: Other than what is stated in HPI, all other systems are negative.   Objective:   Filed Vitals:   01/16/15 1432  BP: 135/80  Pulse: 73  Temp: 98 F (36.7 C)  Resp: 16    Physical Exam  Constitutional: She is oriented to person, place, and time.  HENT:  Right Ear: External ear normal.  Left Ear: External ear normal.  Mouth/Throat: Oropharynx is clear and moist.  Swollen bilateral nasal turbinates  Cardiovascular: Normal rate, regular rhythm and normal heart sounds.   Pulmonary/Chest: Effort normal and breath sounds normal.  Lymphadenopathy:    She has no cervical adenopathy.  Neurological: She is alert and oriented to person, place, and time.  Psychiatric: She has a normal mood and affect.     Lab Results  Component Value Date   WBC 5.9 10/05/2013  HGB 12.2 10/05/2013   HCT 36.7 10/05/2013   MCV 80.3 10/05/2013   PLT 265 10/05/2013   Lab Results  Component Value Date   CREATININE 0.88 08/17/2014   BUN 13 08/17/2014   NA 136 08/17/2014   K 3.9 08/17/2014   CL 98 08/17/2014   CO2 26 08/17/2014    Lab Results  Component Value Date   HGBA1C 5.80 11/20/2014   Lipid Panel     Component Value Date/Time   CHOL 132 09/06/2013 0915   TRIG 75 09/06/2013 0915   HDL 52 09/06/2013 0915   CHOLHDL 2.5 09/06/2013 0915   VLDL 15 09/06/2013 0915   LDLCALC 65 09/06/2013 0915       Assessment and plan:   [name] was seen today for uri.  Diagnoses and all orders for this visit:  Acute frontal sinusitis, recurrence not specified -     Begin amoxicillin-clavulanate (AUGMENTIN)  875-125 MG tablet; Take 1 tablet by mouth 2 (two) times daily.   Return if symptoms worsen or fail to improve.       Ambrose Finland, NP-C [facility] [phone] 01/16/2015, 2:55 PM

## 2015-01-16 NOTE — Patient Instructions (Signed)

## 2015-01-16 NOTE — Progress Notes (Signed)
Patient complains of nasal congestion cough sinus pain for the past week Patient also having thick yellow mucous when she blows her nose Has been using OTC tylenol and nasal spray with little relief

## 2015-02-14 ENCOUNTER — Telehealth: Payer: Self-pay | Admitting: Internal Medicine

## 2015-02-14 ENCOUNTER — Telehealth: Payer: Self-pay

## 2015-02-14 MED FILL — LISINOPRIL-HCTZ 20-12.5 MG: 20-12.5 | 30 days supply | Qty: 60 | Fill #0

## 2015-02-14 MED FILL — AMLODIPINE BESYLATE 10 MG T: 10 | 60 days supply | Qty: 60 | Fill #3

## 2015-02-14 MED FILL — metFORMIN HCL 500 MG TABS: 500 | 90 days supply | Qty: 90 | Fill #0

## 2015-02-14 NOTE — Telephone Encounter (Signed)
Patient came in stating that vitamin B 12 is ineffective  Tingling and numbness in right foot is not improving. Please follow up.

## 2015-02-14 NOTE — Telephone Encounter (Signed)
Returned call to patient about her ailments from her B12 Patient has an appt scheduled for 2/3

## 2015-02-20 ENCOUNTER — Ambulatory Visit: Payer: Self-pay | Attending: Internal Medicine | Admitting: Internal Medicine

## 2015-02-20 ENCOUNTER — Encounter: Payer: Self-pay | Admitting: Internal Medicine

## 2015-02-20 VITALS — BP 128/86 | HR 76 | Temp 98.0°F | Resp 16 | Ht 62.0 in | Wt 204.6 lb

## 2015-02-20 DIAGNOSIS — E119 Type 2 diabetes mellitus without complications: Secondary | ICD-10-CM

## 2015-02-20 DIAGNOSIS — E1141 Type 2 diabetes mellitus with diabetic mononeuropathy: Secondary | ICD-10-CM | POA: Insufficient documentation

## 2015-02-20 DIAGNOSIS — Z0000 Encounter for general adult medical examination without abnormal findings: Secondary | ICD-10-CM

## 2015-02-20 DIAGNOSIS — M79673 Pain in unspecified foot: Secondary | ICD-10-CM | POA: Insufficient documentation

## 2015-02-20 DIAGNOSIS — L409 Psoriasis, unspecified: Secondary | ICD-10-CM | POA: Insufficient documentation

## 2015-02-20 DIAGNOSIS — Z7984 Long term (current) use of oral hypoglycemic drugs: Secondary | ICD-10-CM | POA: Insufficient documentation

## 2015-02-20 DIAGNOSIS — Z23 Encounter for immunization: Secondary | ICD-10-CM

## 2015-02-20 DIAGNOSIS — Z79899 Other long term (current) drug therapy: Secondary | ICD-10-CM | POA: Insufficient documentation

## 2015-02-20 LAB — LIPID PANEL
Cholesterol: 117 mg/dL — ABNORMAL LOW (ref 125–200)
HDL: 40 mg/dL — ABNORMAL LOW (ref >=46)
LDL Cholesterol: 58 mg/dL (ref <130)
TRIGLYCERIDES: 95 mg/dL (ref <150)
Total CHOL/HDL Ratio: 2.9 Ratio (ref <=5.0)
VLDL: 19 mg/dL (ref <30)

## 2015-02-20 LAB — BASIC METABOLIC PANEL
BUN: 7 mg/dL (ref 7–25)
CHLORIDE: 104 mmol/L (ref 98–110)
CO2: 29 mmol/L (ref 20–31)
CREATININE: 0.92 mg/dL (ref 0.50–1.05)
Calcium: 9.9 mg/dL (ref 8.6–10.4)
Glucose, Bld: 65 mg/dL (ref 65–99)
POTASSIUM: 4.2 mmol/L (ref 3.5–5.3)
Sodium: 141 mmol/L (ref 135–146)

## 2015-02-20 LAB — POCT GLYCOSYLATED HEMOGLOBIN (HGB A1C): Hemoglobin A1C: 6

## 2015-02-20 LAB — GLUCOSE, POCT (MANUAL RESULT ENTRY): POC Glucose: 146 mg/dl — AB (ref 70–99)

## 2015-02-20 MED ORDER — FLUOCINOLONE ACETONIDE 0.01 % EX SOLN: Freq: Two times a day (BID) | CUTANEOUS | Status: DC

## 2015-02-20 MED ORDER — GABAPENTIN 100 MG PO CAPS: 100.0000 mg | ORAL_CAPSULE | Freq: Three times a day (TID) | ORAL | Status: DC

## 2015-02-20 MED FILL — FLUOCINOLONE 0.01% SOLUTION: 0.01 | 14 days supply | Qty: 60 | Fill #0

## 2015-02-20 MED FILL — GABAPENTIN 100 MG CAPSULE: 100 | 30 days supply | Qty: 90 | Fill #0

## 2015-02-20 NOTE — Progress Notes (Signed)
Patient complains of bilateral foot pain and tingling Has not been on anything due to interaction with the keppra she takes

## 2015-02-20 NOTE — Progress Notes (Signed)
Patient ID: [name], female   DOB: [birth year], 60 y.o.   MRN: [id number] SUBJECTIVE: 60 y.o. female for follow up of diabetes. Diabetic Review of Systems - medication compliance: compliant all of the time, diabetic diet compliance: compliant all of the time, home glucose monitoring: is performed sporadically, further diabetic ROS: no polyuria or polydipsia, no chest pain, dyspnea or TIA's, no unusual visual symptoms, no hypoglycemia, has dysesthesias in the feet.  Other symptoms and concerns: Pins and needle sensation in bilateral feet for the past 4 months. She has tried OTC Vitamin B-12 but does not feel it has helped at all.  Current Outpatient Prescriptions  Medication Sig Dispense Refill  . acetaminophen (TYLENOL) 500 MG tablet Take 500 mg by mouth every 6 (six) hours as needed.    [name] amLODipine (NORVASC) 10 MG tablet Take 1 tablet (10 mg total) by mouth daily. 90 tablet 3  . cholecalciferol (VITAMIN D) 1000 UNITS tablet Take 1,000 Units by mouth daily.    . Cyanocobalamin (B-12 PO) Take by mouth.    . levETIRAcetam (KEPPRA) 500 MG tablet Take 2 tablets (1,000 mg total) by mouth 2 (two) times daily. 60 tablet 0  . lisinopril-hydrochlorothiazide (ZESTORETIC) 20-12.5 MG tablet Take 2 tablets by mouth daily. 180 tablet 3  . metFORMIN (GLUCOPHAGE) 500 MG tablet Take 1 tablet (500 mg total) by mouth daily with breakfast. 90 tablet 3  . Oxymetazoline HCl (NASAL SPRAY NA) Place into the nose.    [name] amoxicillin-clavulanate (AUGMENTIN) 875-125 MG tablet Take 1 tablet by mouth 2 (two) times daily. (Patient not taking: Reported on 02/20/2015) 14 tablet 0   No current facility-administered medications for this visit.  Review of Systems: Other than what is stated in HPI, all other systems are negative.   OBJECTIVE: Appearance: alert, well appearing, and in no distress, oriented to person, place, and time and overweight. BP 128/86 mmHg  Pulse 76  Temp(Src) 98 F (36.7 C)  Resp 16  Ht  (1.575  m)  Wt 204 lb 9.6 oz (92.806 kg)  BMI 37.41 kg/m2  SpO2 100%  Exam: heart sounds normal rate, regular rhythm, normal S1, S2, no murmurs, rubs, clicks or gallops, no JVD, chest clear, no hepatosplenomegaly, no carotid bruits, feet: warm, good capillary refill, no trophic changes or ulcerative lesions, normal DP and PT pulses, normal monofilament exam and normal sensory exam  ASSESSMENT: Whisper was seen today for foot pain.  Diagnoses and all orders for this visit:  Type 2 diabetes mellitus without complication, without long-term current use of insulin (HCC) -     Glucose (CBG) -     HgB A1c -     Lipid panel -     Basic Metabolic Panel Patients diabetes is well control as evidence by consistently low a1c.  Patient will continue with current therapy and continue to make necessary lifestyle changes.  Reviewed foot care, diet, exercise, annual health maintenance with patient.   Diabetic mononeuropathy associated with type 2 diabetes mellitus (HCC) -     Vitamin D, 25-hydroxy -     Begin gabapentin (NEURONTIN) 100 MG capsule; Take 1 capsule (100 mg total) by mouth 3 (three) times daily. For tingling in feet She may stop Vitamin B12 if it is not helping. She will begin on very low dose gabapentin   Scalp psoriasis -     Begin fluocinolone (SYNALAR) 0.01 % external solution; Apply topically 2 (two) times daily. Only to area of scalp  Preventative health care -  Hepatitis panel, acute -     HIV antibody (with reflex) -     Tdap vaccine greater than or equal to 7yo IM    PLAN: See orders for this visit as documented in the electronic medical record. Issues reviewed with her: diabetic diet discussed in detail, written exchange diet given, low cholesterol diet, weight control and daily exercise discussed, foot care discussed and Podiatry visits discussed, annual eye examinations at Ophthalmology discussed and long term diabetic complications discussed.  Return in about 3 months (around  05/20/2015) for DM/HTN.  Ambrose Finland, NP 02/20/2015 1:56 PM

## 2015-02-21 LAB — HEPATITIS PANEL, ACUTE
HCV Ab: NEGATIVE
HEP A IGM: NONREACTIVE
HEP B C IGM: NONREACTIVE
HEP B S AG: NEGATIVE

## 2015-02-21 LAB — VITAMIN D 25 HYDROXY (VIT D DEFICIENCY, FRACTURES): VIT D 25 HYDROXY: 25 ng/mL — AB (ref 30–100)

## 2015-02-21 LAB — HIV ANTIBODY (ROUTINE TESTING W REFLEX): HIV: NONREACTIVE

## 2015-02-22 ENCOUNTER — Telehealth: Payer: Self-pay

## 2015-02-22 NOTE — Telephone Encounter (Signed)
Pt. Returned call. Please f/u °

## 2015-02-22 NOTE — Telephone Encounter (Signed)
-----   Message from Ambrose Finland, NP sent at 02/22/2015  1:07 PM EST ----- Labs normal except for slightly low vitamin D. She may purchase an over-the-counter vitamin D 1000 international units to take daily.

## 2015-02-22 NOTE — Telephone Encounter (Signed)
Returned patient phone call Patient is aware of her lab results 

## 2015-02-22 NOTE — Telephone Encounter (Signed)
Tried to call patient this afternoon Patient not available Message left on voice mail to return our call

## 2015-03-07 ENCOUNTER — Telehealth: Payer: Self-pay | Admitting: Podiatry

## 2015-03-07 ENCOUNTER — Encounter: Payer: Self-pay | Admitting: *Deleted

## 2015-03-07 NOTE — Telephone Encounter (Addendum)
Pt spoke with C. McCallum requested note to give to [employer] stating she has diabetic neuropathy.  Unable to leave message pt has requested not to accept in-coming calls.  I mailed note to pt.

## 2015-03-07 NOTE — Telephone Encounter (Signed)
Patient waked in today and requested a letter from Dr Ardelle Anton stating that she was diagnosed with diabetic neuropathy to present to social security. Please advise

## 2015-03-07 NOTE — Telephone Encounter (Signed)
Pt. Came in today  Requesting a letter from PCP stating why she is taking all her current medication. In the letter is has to say what medications she is taking. Pt. Needs this letter for social security. Please f/u

## 2015-03-07 NOTE — Telephone Encounter (Signed)
OK to do-

## 2015-03-08 ENCOUNTER — Telehealth: Payer: Self-pay

## 2015-03-08 ENCOUNTER — Telehealth: Payer: Self-pay | Admitting: Neurology

## 2015-03-08 ENCOUNTER — Other Ambulatory Visit: Payer: Self-pay

## 2015-03-08 NOTE — Telephone Encounter (Signed)
Called patient to let her know her letter for disability Is ready and can be picked up at the front desk

## 2015-03-08 NOTE — Telephone Encounter (Signed)
Copy on your desk of what she needs letter to say.

## 2015-03-08 NOTE — Telephone Encounter (Signed)
-----   Message from Ambrose Finland, NP sent at 03/07/2015  8:43 PM EST ----- Please write letter. Med for diabetes, HTN ----- Message -----    From: Lestine Mount, LPN    Sent: 4/69/6295   2:26 PM      To: Ambrose Finland, NP  [name] Female, 60 y.o., 1955-09-23 Last Weight:  204 lb 9.6 oz (92.806 kg) Phone:  [phone] PCP:  [facility] A Language:  English Need Interp:  No AllergiesCodeine Banana Orange Fruit [Citrus] Catfish [Fish Allergy] Shrimp [Shellfish Allergy] Tomato Health Maintenance:  Due FYIGeneral Primary Ins:  197 MRN:  [id number] MyChart:  Pending Next Appt:  05/28/2015 Cortina, Vultaggio - 02/14/2015 10:02 AM >','<< Less Detail',event)" href="javascript:;">More Detail >>  [facility]  Sent: Thu March 07, 2015 11:14 AM   To: Lestine Mount, LPN     [facility] Size    Small Medium Large Extra Extra Large  Candance Bohlman 02/14/2015  Telephone MRN:  [id number]   Description: 60 year old female Provider: Ambrose Finland, NP Department: [facility]    Reason for Call    Medication Management      Call Documentation      [place] Solis at 03/07/2015 11:12 AM    Status: Signed      Expand All Collapse All    Pt. Came in today Requesting a letter from PCP stating why she is taking all her current medication. In the letter is has to say what medications she is taking. Pt. Needs this letter for social security. Please f/u

## 2015-03-08 NOTE — Telephone Encounter (Signed)
Pt states that she needs a letter for disability  Stating that she is sch to come back in may of 2017 and that she has neuropathy and seizures please call pt at [phone]

## 2015-03-08 NOTE — Telephone Encounter (Signed)
Returned patient phone call Patient requesting a letter stating her diagnosis's and  Why she takes the medications she is on

## 2015-03-12 ENCOUNTER — Encounter: Payer: Self-pay | Admitting: Neurology

## 2015-03-12 ENCOUNTER — Telehealth: Payer: Self-pay | Admitting: Neurology

## 2015-03-12 NOTE — Telephone Encounter (Signed)
Pt called and asked if the letter was ready for social security/Dawn CB# [phone]

## 2015-03-12 NOTE — Telephone Encounter (Signed)
Note done!  Thanks.

## 2015-03-12 NOTE — Telephone Encounter (Signed)
Returned call and notified her letter was ready for p/u. Letter left up front.

## 2015-03-12 NOTE — Telephone Encounter (Signed)
Patient called back and asked that letter be mailed to her home address. Letter put in mail, will go out tomorrow.

## 2015-03-15 ENCOUNTER — Encounter: Payer: Self-pay | Admitting: Clinical

## 2015-03-15 NOTE — Progress Notes (Signed)
Depression screen PHQ 2/9 02/20/2015  Decreased Interest 0  Down, Depressed, Hopeless 3  PHQ - 2 Score 3  Altered sleeping 0  Tired, decreased energy 3  Change in appetite 0  Feeling bad or failure about yourself  3  Trouble concentrating 3  Moving slowly or fidgety/restless 3  Suicidal thoughts 0  PHQ-9 Score 15    GAD 7 : Generalized Anxiety Score 02/20/2015  Nervous, Anxious, on Edge 3  Control/stop worrying 3  Worry too much - different things 3  Trouble relaxing 3  Restless 3  Easily annoyed or irritable 3  Afraid - awful might happen 3  Total GAD 7 Score 21

## 2015-03-22 MED FILL — GABAPENTIN 100 MG CAPSULE: 100 | 30 days supply | Qty: 90 | Fill #1

## 2015-03-22 MED FILL — LISINOPRIL-HCTZ 20-12.5 MG: 20-12.5 | 30 days supply | Qty: 60 | Fill #1

## 2015-04-03 ENCOUNTER — Ambulatory Visit: Payer: Self-pay | Attending: Internal Medicine

## 2015-04-19 ENCOUNTER — Telehealth: Payer: Self-pay

## 2015-04-19 ENCOUNTER — Telehealth: Payer: Self-pay | Admitting: Internal Medicine

## 2015-04-19 DIAGNOSIS — Z0000 Encounter for general adult medical examination without abnormal findings: Secondary | ICD-10-CM

## 2015-04-19 MED FILL — GABAPENTIN 100 MG CAPSULE: 100 | 30 days supply | Qty: 90 | Fill #2

## 2015-04-19 MED FILL — LISINOPRIL-HCTZ 20-12.5 MG: 20-12.5 | 30 days supply | Qty: 60 | Fill #2

## 2015-04-19 MED FILL — AMLODIPINE BESYLATE 10 MG T: 10 | 60 days supply | Qty: 60 | Fill #4

## 2015-04-19 NOTE — Telephone Encounter (Signed)
Pt. Came into facility requesting to speak with the nurse regarding her epilepsy medication. Pt. Would like to know is she can go the dentist and get her teeth cleaned while taking her medication. Please f/u with pt.

## 2015-04-19 NOTE — Telephone Encounter (Signed)
Returned patient phone call Patient is requesting a referral to the dentist Referral placed in epic

## 2015-05-15 MED FILL — metFORMIN HCL 500 MG TABS: 500 | 90 days supply | Qty: 90 | Fill #1

## 2015-05-15 MED FILL — LISINOPRIL-HCTZ 20-12.5 MG: 20-12.5 | 30 days supply | Qty: 60 | Fill #3

## 2015-05-21 ENCOUNTER — Telehealth: Payer: Self-pay | Admitting: Neurology

## 2015-05-21 NOTE — Telephone Encounter (Signed)
PT called to reschedule appointment and she said she needs to be seen sooner than July/Dawn CB# [phone]

## 2015-05-22 NOTE — Telephone Encounter (Signed)
Returned call. Patient states she needed to be seen sooner because she is due to go to court in June for her disability. I explained to patient that Dr. Karel JarvisAquino doesn't do anything with disability and that we didn't have any sooner appts than July. She states she would speak with her lawyer. I advised her to call us back if she wanted to be put on the schedule for July for when Dr. Karel JarvisAquino comes back.

## 2015-05-23 ENCOUNTER — Other Ambulatory Visit: Payer: Self-pay | Admitting: Internal Medicine

## 2015-05-27 ENCOUNTER — Other Ambulatory Visit: Payer: Self-pay | Admitting: Internal Medicine

## 2015-05-28 ENCOUNTER — Ambulatory Visit: Payer: Self-pay | Admitting: Neurology

## 2015-05-29 ENCOUNTER — Encounter: Payer: Self-pay | Admitting: Internal Medicine

## 2015-05-29 ENCOUNTER — Ambulatory Visit: Payer: Self-pay | Attending: Internal Medicine | Admitting: Internal Medicine

## 2015-05-29 VITALS — BP 133/80 | HR 69 | Temp 97.4°F | Wt 205.8 lb

## 2015-05-29 DIAGNOSIS — E1140 Type 2 diabetes mellitus with diabetic neuropathy, unspecified: Secondary | ICD-10-CM | POA: Insufficient documentation

## 2015-05-29 DIAGNOSIS — G5600 Carpal tunnel syndrome, unspecified upper limb: Secondary | ICD-10-CM | POA: Insufficient documentation

## 2015-05-29 DIAGNOSIS — Z79899 Other long term (current) drug therapy: Secondary | ICD-10-CM | POA: Insufficient documentation

## 2015-05-29 DIAGNOSIS — I10 Essential (primary) hypertension: Secondary | ICD-10-CM | POA: Insufficient documentation

## 2015-05-29 DIAGNOSIS — R7303 Prediabetes: Secondary | ICD-10-CM | POA: Insufficient documentation

## 2015-05-29 DIAGNOSIS — J302 Other seasonal allergic rhinitis: Secondary | ICD-10-CM | POA: Insufficient documentation

## 2015-05-29 DIAGNOSIS — Z888 Allergy status to other drugs, medicaments and biological substances status: Secondary | ICD-10-CM | POA: Insufficient documentation

## 2015-05-29 DIAGNOSIS — F411 Generalized anxiety disorder: Secondary | ICD-10-CM

## 2015-05-29 DIAGNOSIS — E1141 Type 2 diabetes mellitus with diabetic mononeuropathy: Secondary | ICD-10-CM

## 2015-05-29 DIAGNOSIS — Z90710 Acquired absence of both cervix and uterus: Secondary | ICD-10-CM | POA: Insufficient documentation

## 2015-05-29 DIAGNOSIS — E119 Type 2 diabetes mellitus without complications: Secondary | ICD-10-CM

## 2015-05-29 DIAGNOSIS — Z1211 Encounter for screening for malignant neoplasm of colon: Secondary | ICD-10-CM

## 2015-05-29 LAB — GLUCOSE, POCT (MANUAL RESULT ENTRY): POC GLUCOSE: 121 mg/dL — AB (ref 70–99)

## 2015-05-29 LAB — POCT GLYCOSYLATED HEMOGLOBIN (HGB A1C): Hemoglobin A1C: 6.1

## 2015-05-29 LAB — TSH: TSH: 2.89 m[IU]/L

## 2015-05-29 MED ORDER — GABAPENTIN 100 MG PO CAPS: 100.0000 mg | ORAL_CAPSULE | Freq: Two times a day (BID) | ORAL | Status: DC

## 2015-05-29 MED ORDER — METFORMIN HCL 500 MG PO TABS: 500.0000 mg | ORAL_TABLET | Freq: Two times a day (BID) | ORAL | Status: DC

## 2015-05-29 MED ORDER — TRIAMCINOLONE ACETONIDE 55 MCG/ACT NA AERO: 2.0000 | INHALATION_SPRAY | Freq: Every day | NASAL | Status: DC

## 2015-05-29 MED ORDER — LEVETIRACETAM ER 500 MG PO TB24: 1000.0000 mg | ORAL_TABLET | Freq: Two times a day (BID) | ORAL | Status: DC

## 2015-05-29 MED ORDER — LORATADINE 10 MG PO TABS: 10.0000 mg | ORAL_TABLET | Freq: Every day | ORAL | Status: DC

## 2015-05-29 MED FILL — LORATADINE 10 MG TABLET: 10 | 30 days supply | Qty: 30 | Fill #0

## 2015-05-29 NOTE — Progress Notes (Signed)
[name], is a 60 y.o. female  ZOX:[id number]  [id number]  DOB - 1955/03/02  CC:  Chief Complaint  Patient presents with  . Follow-up    DM;HTN;Neuropathy       HPI: [name] is a 60 y.o. female w/ PMHx of epilepsy, morbid obesity, prediabetes, HTN,  here today to establish medical care.  She is doing very well, and denies any complaints. Eating well, no pains/complaints today.  She states she tries to avoid salty foods in general and watches what she eats.  She does have some seasonal rhinitis, OTC nasal sprays are helping currently. No f/c.  Asked me for rx for keppra xr 1000mg  po bid (I personally reviewed the pill bottle w/ her) to send to Patient Assistance program.  She had an eye exam 12/16 w/o acute dm findings.  Asked me about weight loss ideas.   Patient has No headache, No chest pain, No abdominal pain - No Nausea, No new weakness tingling or numbness, No Cough - SOB.  Allergies  Allergen Reactions  . Codeine Anaphylaxis  . Banana     Cannot eat any while taking KEPPRA  . Orange Fruit [Citrus]     Cannot have while taking KEPPRA  . Catfish [Fish Allergy] Rash  . Shrimp [Shellfish Allergy] Nausea And Vomiting and Rash    SHRIMP ONLY  . Tomato Itching and Rash   Past Medical History  Diagnosis Date  . Hypertension   . Carpal tunnel syndrome   . Seizures (HCC)    Current Outpatient Prescriptions on File Prior to Visit  Medication Sig Dispense Refill  . acetaminophen (TYLENOL) 500 MG tablet Take 500 mg by mouth every 6 (six) hours as needed.    [name]. amLODipine (NORVASC) 10 MG tablet Take 1 tablet (10 mg total) by mouth daily. 90 tablet 3  . cholecalciferol (VITAMIN D) 1000 UNITS tablet Take 1,000 Units by mouth daily.    [name]. gabapentin (NEURONTIN) 100 MG capsule Take 1 capsule (100 mg total) by mouth 3 (three) times daily. For tingling in feet 90 capsule 3  . lisinopril-hydrochlorothiazide (ZESTORETIC) 20-12.5 MG tablet Take 2 tablets by mouth  daily. 180 tablet 3  . Oxymetazoline HCl (NASAL SPRAY NA) Place into the nose.    [name]. amoxicillin-clavulanate (AUGMENTIN) 875-125 MG tablet Take 1 tablet by mouth 2 (two) times daily. (Patient not taking: Reported on 02/20/2015) 14 tablet 0  . Cyanocobalamin (B-12 PO) Take by mouth. Reported on 05/29/2015    . fluocinolone (SYNALAR) 0.01 % external solution Apply topically 2 (two) times daily. Only to area of scalp (Patient not taking: Reported on 05/29/2015) 60 mL 0   No current facility-administered medications on file prior to visit.   Family History  Problem Relation Age of Onset  . Asthma Mother   . Hypertension Father   . Heart disease Father   . Stroke Father   . Cancer Sister     uterus cancer   . Diabetes Maternal Uncle    Social History   Social History  . Marital Status: Divorced    Spouse Name: N/A  . Number of Children: 1  . Years of Education: N/A   Occupational History  . Not on file.   Social History Main Topics  . Smoking status: Never Smoker   . Smokeless tobacco: Never Used  . Alcohol Use: No  . Drug Use: No  . Sexual Activity: Not on file   Other Topics Concern  . Not on file  Social History Narrative    Review of Systems: Constitutional: Negative for fever, chills, diaphoresis, activity change, appetite change and fatigue. HENT: Negative for ear pain, nosebleeds, congestion, facial swelling, rhinorrhea, neck pain, neck stiffness and ear discharge.   +seasonal rhinitis Eyes: Negative for pain, discharge, redness, itching and visual disturbance. Respiratory: Negative for cough, choking, chest tightness, shortness of breath, wheezing and stridor.  Cardiovascular: Negative for chest pain, palpitations and leg swelling. Gastrointestinal: Negative for abdominal distention.  No melena/hematochezia/hematemesis Genitourinary: Negative for dysuria, urgency, frequency, hematuria, flank pain, decreased urine volume, difficulty urinating and dyspareunia.    Musculoskeletal: Negative for back pain, joint swelling, arthralgia and gait problem. Neurological: Negative for dizziness, tremors, seizures, syncope, facial asymmetry, speech difficulty, weakness, light-headedness, numbness and headaches.  Hematological: Negative for adenopathy. Does not bruise/bleed easily. Psychiatric/Behavioral: Negative for hallucinations, behavioral problems, confusion, dysphoric mood, decreased concentration and agitation.    Objective:   Filed Vitals:   05/29/15 0930  BP: 133/80  Pulse: 69  Temp: 97.4 F (36.3 C)    Filed Weights   05/29/15 0930  Weight: 205 lb 12.8 oz (93.35 kg)    BP Readings from Last 3 Encounters:  05/29/15 133/80  02/20/15 128/86  01/16/15 135/80   bmi 37.6  Physical Exam: Constitutional: Patient appears well-developed and well-nourished. No distress. AAOx3, short statured, morbid obese, very pleasant.  +near sighted, forgot her glasses at home HENT: Normocephalic, atraumatic, External right and left ear normal. Oropharynx is clear and moist.  Eyes: Conjunctivae and EOM are normal. PERRL, no scleral icterus. Neck: Normal ROM. Neck supple. No JVD. No tracheal deviation. No thyromegaly. Neg carotid bruits. CVS: RRR, S1/S2 +, no murmurs, no gallops, no carotid bruit.  Pulmonary: Effort and breath sounds normal, no stridor, rhonchi, wheezes, rales.  Abdominal: Soft. Obese, BS +, no distension, tenderness, rebound or guarding.  Musculoskeletal: Normal range of motion. No edema and no tenderness.  LE: bilat/ no c/c/e, pulses 2+ bilateral. Lymphadenopathy: No lymphadenopathy noted, cervical Neuro: Alert. muscle tone coordination. No cranial nerve deficit grossly. Skin: Skin is warm and dry. No rash noted. Not diaphoretic. No erythema. No pallor. Psychiatric: Normal mood and affect. Behavior, judgment, thought content normal.  Lab Results  Component Value Date   WBC 5.9 10/05/2013   HGB 12.2 10/05/2013   HCT 36.7 10/05/2013    MCV 80.3 10/05/2013   PLT 265 10/05/2013   Lab Results  Component Value Date   CREATININE 0.92 02/20/2015   BUN 7 02/20/2015   NA 141 02/20/2015   K 4.2 02/20/2015   CL 104 02/20/2015   CO2 29 02/20/2015    Lab Results  Component Value Date   HGBA1C 6.1 05/29/2015   Lipid Panel     Component Value Date/Time   CHOL 117* 02/20/2015 0952   TRIG 95 02/20/2015 0952   HDL 40* 02/20/2015 0952   CHOLHDL 2.9 02/20/2015 0952   VLDL 19 02/20/2015 0952   LDLCALC 58 02/20/2015 0952       Depression screen PHQ 2/9 05/29/2015 02/20/2015  Decreased Interest 3 0  Down, Depressed, Hopeless 3 3  PHQ - 2 Score 6 3  Altered sleeping 3 0  Tired, decreased energy 3 3  Change in appetite 3 0  Feeling bad or failure about yourself  3 3  Trouble concentrating 3 3  Moving slowly or fidgety/restless 3 3  Suicidal thoughts 3 0  PHQ-9 Score 27 15  Difficult doing work/chores Extremely dIfficult -    Assessment and plan:  1. Type 2 diabetes mellitus without complication, without long-term current use of insulin (HCC) - POCT glucose (manual entry) - POCT glycosylated hemoglobin (Hb A1C) - metFORMIN (GLUCOPHAGE) 500 MG tablet; Take 1 tablet (500 mg total) by mouth 2 (two) times daily with a meal.  Dispense: 90 tablet; Refill: 3  2. Prediabetes a1c 6.1 today, dw patient diet/exercise recommendations - increase metformin to 500mg  bid for now, pt denies sx w/ metformin.  3.  Anxiety/depression per screening  Vs mental states, mild cognitive decline?? -patient denied sx w/ me, affect happy throughout exam - will place amb ref Behaviol health for f/u closely  4. Colon cancer screening - Ambulatory referral to Gastroenterology - colonoscopy  5. Seasonal allergic rhinitis Trial claritin and nasalcort  6. Morbid obesity, unspecified obesity type (HCC) Recd weight  Loss exercise, info given; recd low impact sports such as swimming, I told her [facility] has great indoor pool, walk more. -  TSH  7. Hx of hysterectomy 2008 due to mennorrhagia - doesn't need pap.  Return in about 3 months (around 08/29/2015), or if symptoms worsen or fail to improve.  The patient was given clear instructions to go to ER or return to medical center if symptoms don't improve, worsen or new problems develop. The patient verbalized understanding. The patient was told to call to get lab results if they haven't heard anything in the next week.      Pete Glatter, MD, MBA/MHA [facility] [place], [place] [phone]   05/29/2015, 10:58 AM

## 2015-05-29 NOTE — Patient Instructions (Signed)
Aim for 30 minutes of exercise most days. Rethink what you drink. Water is great! Aim for 2-3 Carb Choices per meal (30-45 grams) +/- 1 either way.  Aim for 0-15 Carbs per snack if hungry.  Include protein in moderation with your meals and snacks.  Consider reading food labels for Total Carbohydrate and Fat Grams of foods  Consider checking blood glucose (accuchecks/BG) at alternate times per day.  Continue taking medication as directed. Be mindful about how much sugar you are adding to beverages and other foods. Fruit Punch - find one with no sugar  Measure and decrease portions of carbohydrate foods. Make your plate and don't go back for seconds.    Exercising to Lose Weight Exercising can help you to lose weight. In order to lose weight through exercise, you need to do vigorous-intensity exercise. You can tell that you are exercising with vigorous intensity if you are breathing very hard and fast and cannot hold a conversation while exercising. Moderate-intensity exercise helps to maintain your current weight. You can tell that you are exercising at a moderate level if you have a higher heart rate and faster breathing, but you are still able to hold a conversation. HOW OFTEN SHOULD I EXERCISE? Choose an activity that you enjoy and set realistic goals. Your health care provider can help you to make an activity plan that works for you. Exercise regularly as directed by your health care provider. This may include:  Doing resistance training twice each week, such as:  Push-ups.  Sit-ups.  Lifting weights.  Using resistance bands.  Doing a given intensity of exercise for a given amount of time. Choose from these options:  150 minutes of moderate-intensity exercise every week.  75 minutes of vigorous-intensity exercise every week.  A mix of moderate-intensity and vigorous-intensity exercise every week. Children, pregnant women, people who are out of shape, people who are  overweight, and older adults may need to consult a health care provider for individual recommendations. If you have any sort of medical condition, be sure to consult your health care provider before starting a new exercise program. WHAT ARE SOME ACTIVITIES THAT CAN HELP ME TO LOSE WEIGHT?   Walking at a rate of at least 4.5 miles an hour.  Jogging or running at a rate of 5 miles per hour.  Biking at a rate of at least 10 miles per hour.  Lap swimming.  Roller-skating or in-line skating.  Cross-country skiing.  Vigorous competitive sports, such as football, basketball, and soccer.  Jumping rope.  Aerobic dancing. HOW CAN I BE MORE ACTIVE IN MY DAY-TO-DAY ACTIVITIES?  Use the stairs instead of the elevator.  Take a walk during your lunch break.  If you drive, park your car farther away from work or school.  If you take public transportation, get off one stop early and walk the rest of the way.  Make all of your phone calls while standing up and walking around.  Get up, stretch, and walk around every 30 minutes throughout the day. WHAT GUIDELINES SHOULD I FOLLOW WHILE EXERCISING?  Do not exercise so much that you hurt yourself, feel dizzy, or get very short of breath.  Consult your health care provider prior to starting a new exercise program.  Wear comfortable clothes and shoes with good support.  Drink plenty of water while you exercise to prevent dehydration or heat stroke. Body water is lost during exercise and must be replaced.  Work out until you breathe faster and your  heart beats faster.   This information is not intended to replace advice given to you by your health care provider. Make sure you discuss any questions you have with your health care provider.   Document Released: 02/07/2010 Document Revised: 01/26/2014 Document Reviewed: 06/08/2013 Elsevier Interactive Patient Education 2016 [employer]   Diabetes Mellitus and Food It is important for you to  manage your blood sugar (glucose) level. Your blood glucose level can be greatly affected by what you eat. Eating healthier foods in the appropriate amounts throughout the day at about the same time each day will help you control your blood glucose level. It can also help slow or prevent worsening of your diabetes mellitus. Healthy eating may even help you improve the level of your blood pressure and reach or maintain a healthy weight.  General recommendations for healthful eating and cooking habits include:  Eating meals and snacks regularly. Avoid going long periods of time without eating to lose weight.  Eating a diet that consists mainly of plant-based foods, such as fruits, vegetables, nuts, legumes, and whole grains.  Using low-heat cooking methods, such as baking, instead of high-heat cooking methods, such as deep frying. Work with your dietitian to make sure you understand how to use the Nutrition Facts information on food labels. HOW CAN FOOD AFFECT ME? Carbohydrates Carbohydrates affect your blood glucose level more than any other type of food. Your dietitian will help you determine how many carbohydrates to eat at each meal and teach you how to count carbohydrates. Counting carbohydrates is important to keep your blood glucose at a healthy level, especially if you are using insulin or taking certain medicines for diabetes mellitus. Alcohol Alcohol can cause sudden decreases in blood glucose (hypoglycemia), especially if you use insulin or take certain medicines for diabetes mellitus. Hypoglycemia can be a life-threatening condition. Symptoms of hypoglycemia (sleepiness, dizziness, and disorientation) are similar to symptoms of having too much alcohol.  If your health care provider has given you approval to drink alcohol, do so in moderation and use the following guidelines:  Women should not have more than one drink per day, and men should not have more than two drinks per day. One drink  is equal to:  12 oz of beer.  5 oz of wine.  1 oz of hard liquor.  Do not drink on an empty stomach.  Keep yourself hydrated. Have water, diet soda, or unsweetened iced tea.  Regular soda, juice, and other mixers might contain a lot of carbohydrates and should be counted. WHAT FOODS ARE NOT RECOMMENDED? As you make food choices, it is important to remember that all foods are not the same. Some foods have fewer nutrients per serving than other foods, even though they might have the same number of calories or carbohydrates. It is difficult to get your body what it needs when you eat foods with fewer nutrients. Examples of foods that you should avoid that are high in calories and carbohydrates but low in nutrients include:  Trans fats (most processed foods list trans fats on the Nutrition Facts label).  Regular soda.  Juice.  Candy.  Sweets, such as cake, pie, doughnuts, and cookies.  Fried foods. WHAT FOODS CAN I EAT? Eat nutrient-rich foods, which will nourish your body and keep you healthy. The food you should eat also will depend on several factors, including:  The calories you need.  The medicines you take.  Your weight.  Your blood glucose level.  Your blood pressure level.  Your cholesterol level. You should eat a variety of foods, including:  Protein.  Lean cuts of meat.  Proteins low in saturated fats, such as fish, egg whites, and beans. Avoid processed meats.  Fruits and vegetables.  Fruits and vegetables that may help control blood glucose levels, such as apples, mangoes, and yams.  Dairy products.  Choose fat-free or low-fat dairy products, such as milk, yogurt, and cheese.  Grains, bread, pasta, and rice.  Choose whole grain products, such as multigrain bread, whole oats, and brown rice. These foods may help control blood pressure.  Fats.  Foods containing healthful fats, such as nuts, avocado, olive oil, canola oil, and fish. DOES EVERYONE  WITH DIABETES MELLITUS HAVE THE SAME MEAL PLAN? Because every person with diabetes mellitus is different, there is not one meal plan that works for everyone. It is very important that you meet with a dietitian who will help you create a meal plan that is just right for you.   This information is not intended to replace advice given to you by your health care provider. Make sure you discuss any questions you have with your health care provider.   Document Released: 10/02/2004 Document Revised: 01/26/2014 Document Reviewed: 12/02/2012 Elsevier Interactive Patient Education [employer]  -

## 2015-06-18 ENCOUNTER — Telehealth: Payer: Self-pay

## 2015-06-18 NOTE — Telephone Encounter (Signed)
[phone]  PATIENT RECEIVED LETTER TO SCHEDULE TCS

## 2015-06-19 MED FILL — LISINOPRIL-HCTZ 20-12.5 MG: 20-12.5 | 30 days supply | Qty: 60 | Fill #4

## 2015-06-19 MED FILL — AMLODIPINE BESYLATE 10 MG T: 10 | 60 days supply | Qty: 60 | Fill #5

## 2015-06-19 NOTE — Telephone Encounter (Signed)
I called pt and she is having some stomach problems. Scheduled for OV with Wynne DustEric Gill, NP on 07/03/2015 at 9:30 Am.

## 2015-06-19 NOTE — Telephone Encounter (Signed)
Pt  Was informed per Durward Mallardamille to bring her supporting documents with her to the office bisit so [facility] which will expire on 07/04/2015.

## 2015-07-01 ENCOUNTER — Ambulatory Visit: Payer: Self-pay | Attending: Internal Medicine

## 2015-07-03 ENCOUNTER — Encounter: Payer: Self-pay | Admitting: Nurse Practitioner

## 2015-07-03 ENCOUNTER — Other Ambulatory Visit: Payer: Self-pay

## 2015-07-03 ENCOUNTER — Ambulatory Visit (INDEPENDENT_AMBULATORY_CARE_PROVIDER_SITE_OTHER): Payer: Self-pay | Admitting: Nurse Practitioner

## 2015-07-03 VITALS — BP 132/81 | HR 85 | Temp 97.2°F | Ht 62.0 in | Wt 206.2 lb

## 2015-07-03 DIAGNOSIS — K625 Hemorrhage of anus and rectum: Secondary | ICD-10-CM

## 2015-07-03 DIAGNOSIS — Z1211 Encounter for screening for malignant neoplasm of colon: Secondary | ICD-10-CM | POA: Insufficient documentation

## 2015-07-03 HISTORY — DX: Hemorrhage of anus and rectum: K62.5

## 2015-07-03 MED ORDER — PHOSPHATE LAXATIVE 2.7-7.2 GM/15ML PO SOLN: 15.0000 mL | Freq: Once | ORAL | Status: DC

## 2015-07-03 NOTE — Progress Notes (Signed)
CC'ED TO PCP 

## 2015-07-03 NOTE — Assessment & Plan Note (Signed)
60 year old African-American patient who is never had a colonoscopy. Generally asymptomatic from a GI standpoint with the exception of noted rectal bleeding over the past several days. This has not occurred previously to recent episode. At this point we'll arrange for colonoscopy to further evaluate as well as accomplish her first-ever screening for colorectal cancer.  Proceed with colonoscopy with 12.5 mg Phenergan with Dr. Darrick PennaFields in the near future. The risks, benefits, and alternatives have been discussed in detail with the patient. They state understanding and desire to proceed.   The patient is not on any anticoagulants, anxiolytics, chronic pain medications, or antidepressants. She does have a very high level anxiety and we will add 12.5 mg preprocedure Phenergan to promote adequate sedation

## 2015-07-03 NOTE — Progress Notes (Signed)
Primary Care Physician:  Pete Glatter, MD Primary Gastroenterologist:  Dr. Darrick Penna  Chief Complaint  Patient presents with  . Abdominal Pain    HPI:   [name] is a 60 y.o. female who presents on referral from primary care for colonoscopy. Attempted phone triage was converted to schedule office visit due to "having some stomach problems." No records of colonoscopy found in our system.  Today she states she has never had a colonoscopy before. Denies abdominal pain, N/V. Admits rectal bleeding over the past few days, has never had a problem with this before, on the stool. Denies melena. Denies fever, chills, unintentional weight loss. Has a history of epilepsy and seizures, currently on Keppra for them. Last seizure was last month (one episode). States she had chest pain that she thinks is related to anxiety. Has had them before and PCP is aware (per patient). Denies dyspnea, dizziness, lightheadedness, syncope, near syncope. Denies any other upper or lower GI symptoms.  Past Medical History  Diagnosis Date  . Hypertension   . Carpal tunnel syndrome   . Seizures [facility])     Past Surgical History  Procedure Laterality Date  . Abdominal hysterectomy    . Hernia repair    . Myomectomy      Current Outpatient Prescriptions  Medication Sig Dispense Refill  . acetaminophen (TYLENOL) 500 MG tablet Take 500 mg by mouth every 6 (six) hours as needed.    [name] amLODipine (NORVASC) 10 MG tablet Take 1 tablet (10 mg total) by mouth daily. 90 tablet 3  . cholecalciferol (VITAMIN D) 1000 UNITS tablet Take 1,000 Units by mouth daily.    . Cyanocobalamin (B-12 PO) Take by mouth. Reported on 05/29/2015    . gabapentin (NEURONTIN) 100 MG capsule Take 1 capsule (100 mg total) by mouth 2 (two) times daily. For tingling in feet 90 capsule 3  . levETIRAcetam (KEPPRA XR) 500 MG 24 hr tablet Take 2 tablets (1,000 mg total) by mouth 2 (two) times daily. 120 tablet 2  . lisinopril-hydrochlorothiazide  (ZESTORETIC) 20-12.5 MG tablet Take 2 tablets by mouth daily. 180 tablet 3  . loratadine (CLARITIN) 10 MG tablet Take 1 tablet (10 mg total) by mouth daily. 30 tablet 11  . metFORMIN (GLUCOPHAGE) 500 MG tablet Take 1 tablet (500 mg total) by mouth 2 (two) times daily with a meal. 90 tablet 3  . Oxymetazoline HCl (NASAL SPRAY NA) Place into the nose.    . triamcinolone (NASACORT AQ) 55 MCG/ACT AERO nasal inhaler Place 2 sprays into the nose daily. 1 Inhaler 12   No current facility-administered medications for this visit.    Allergies as of 07/03/2015 - Review Complete 07/03/2015  Allergen Reaction Noted  . Codeine Anaphylaxis 08/24/2013  . Banana  10/05/2013  . Orange fruit [citrus]  10/05/2013  . Catfish [fish allergy] Rash 06/13/2014  . Shrimp [shellfish allergy] Nausea And Vomiting and Rash 10/05/2013  . Tomato Itching and Rash 08/24/2013    Family History  Problem Relation Age of Onset  . Asthma Mother   . Hypertension Father   . Heart disease Father   . Stroke Father   . Cancer Sister     uterus cancer   . Diabetes Maternal Uncle   . Colon cancer Neg Hx     Social History   Social History  . Marital Status: Divorced    Spouse Name: N/A  . Number of Children: 1  . Years of Education: N/A   Occupational History  .  Not on file.   Social History Main Topics  . Smoking status: Never Smoker   . Smokeless tobacco: Never Used  . Alcohol Use: No  . Drug Use: No  . Sexual Activity: Not on file   Other Topics Concern  . Not on file   Social History Narrative    Review of Systems: 10-point ROS negative except as per HPI.    Physical Exam: BP 132/81 mmHg  Pulse 85  Temp(Src) 97.2 F (36.2 C) (Oral)  Ht 5\' 2"  (1.575 m)  Wt 206 lb 3.2 oz (93.532 kg)  BMI 37.71 kg/m2 General:   Obese female alert and oriented. Pleasant and cooperative. Well-nourished and well-developed.  Head:  Normocephalic and atraumatic. Eyes:  Without icterus, sclera clear and  conjunctiva pink.  Ears:  Normal auditory acuity. Cardiovascular:  S1, S2 present without murmurs appreciated. Extremities without clubbing or edema. Respiratory:  Clear to auscultation bilaterally. No wheezes, rales, or rhonchi. No distress.  Gastrointestinal:  +BS, soft, non-tender and non-distended. No HSM noted. No guarding or rebound. No masses appreciated. Rectal:  Deferred  Skin: Midline abdominal incisional scar noted from hysterectomy with some increased scaring thickness. Musculoskalatal:  Symmetrical without gross deformities. Neurologic:  Alert and oriented x4;  grossly normal neurologically. Psych:  Alert and cooperative. Normal mood and affect. Heme/Lymph/Immune: No excessive bruising noted.    07/03/2015 9:58 AM   Disclaimer: This note was dictated with voice recognition software. Similar sounding words can inadvertently be transcribed and may not be corrected upon review.

## 2015-07-03 NOTE — Patient Instructions (Signed)
1. We will schedule your colonoscopy for you. 2. Follow year instructions related to your preparation medicine closely. 3. Return for follow-up based on recommendations and make after your procedure.

## 2015-07-15 ENCOUNTER — Ambulatory Visit (HOSPITAL_COMMUNITY)
Admission: RE | Admit: 2015-07-15 | Discharge: 2015-07-15 | Disposition: A | Discharge status: Home / Self Care | Payer: Self-pay | Source: Ambulatory Visit | Attending: Gastroenterology | Admitting: Gastroenterology

## 2015-07-15 ENCOUNTER — Other Ambulatory Visit: Payer: Self-pay

## 2015-07-15 ENCOUNTER — Telehealth: Payer: Self-pay | Admitting: Gastroenterology

## 2015-07-15 ENCOUNTER — Encounter (HOSPITAL_COMMUNITY): Payer: Self-pay | Admitting: *Deleted

## 2015-07-15 ENCOUNTER — Encounter (HOSPITAL_COMMUNITY)
Admission: RE | Disposition: A | Discharge status: Home / Self Care | Payer: Self-pay | Source: Ambulatory Visit | Attending: Gastroenterology

## 2015-07-15 DIAGNOSIS — I10 Essential (primary) hypertension: Secondary | ICD-10-CM | POA: Insufficient documentation

## 2015-07-15 DIAGNOSIS — K648 Other hemorrhoids: Secondary | ICD-10-CM | POA: Insufficient documentation

## 2015-07-15 DIAGNOSIS — Z7984 Long term (current) use of oral hypoglycemic drugs: Secondary | ICD-10-CM | POA: Insufficient documentation

## 2015-07-15 DIAGNOSIS — K921 Melena: Secondary | ICD-10-CM | POA: Insufficient documentation

## 2015-07-15 DIAGNOSIS — K625 Hemorrhage of anus and rectum: Secondary | ICD-10-CM

## 2015-07-15 DIAGNOSIS — Q438 Other specified congenital malformations of intestine: Secondary | ICD-10-CM | POA: Insufficient documentation

## 2015-07-15 DIAGNOSIS — Z79899 Other long term (current) drug therapy: Secondary | ICD-10-CM | POA: Insufficient documentation

## 2015-07-15 DIAGNOSIS — E119 Type 2 diabetes mellitus without complications: Secondary | ICD-10-CM | POA: Insufficient documentation

## 2015-07-15 HISTORY — PX: COLONOSCOPY: SHX5424

## 2015-07-15 LAB — GLUCOSE, CAPILLARY: GLUCOSE-CAPILLARY: 91 mg/dL (ref 65–99)

## 2015-07-15 SURGERY — COLONOSCOPY
Anesthesia: Moderate Sedation

## 2015-07-15 MED ORDER — SODIUM CHLORIDE 0.9% FLUSH
INTRAVENOUS | Status: AC
  Filled 2015-07-15: qty 10

## 2015-07-15 MED ORDER — MEPERIDINE HCL 100 MG/ML IJ SOLN
INTRAMUSCULAR | Status: AC
  Filled 2015-07-15: qty 2

## 2015-07-15 MED ORDER — SODIUM CHLORIDE 0.9 % IV SOLN
INTRAVENOUS | Status: DC
  Administered 2015-07-15: 1000 mL via INTRAVENOUS

## 2015-07-15 MED ORDER — SIMETHICONE 40 MG/0.6ML PO SUSP
ORAL | Status: DC | PRN
  Administered 2015-07-15: 10:00:00

## 2015-07-15 MED ORDER — MEPERIDINE HCL 100 MG/ML IJ SOLN
INTRAMUSCULAR | Status: DC | PRN
Start: 2015-07-15 — End: 2015-07-15
  Administered 2015-07-15 (×3): 25 mg via INTRAVENOUS

## 2015-07-15 MED ORDER — PROMETHAZINE HCL 25 MG/ML IJ SOLN
12.5000 mg | Freq: Once | INTRAMUSCULAR | Status: AC
  Administered 2015-07-15: 25 mg via INTRAVENOUS

## 2015-07-15 MED ORDER — PROMETHAZINE HCL 25 MG/ML IJ SOLN
INTRAMUSCULAR | Status: AC
  Administered 2015-07-15: 25 mg via INTRAVENOUS
  Filled 2015-07-15: qty 1

## 2015-07-15 MED ORDER — MIDAZOLAM HCL 5 MG/5ML IJ SOLN
INTRAMUSCULAR | Status: DC | PRN
  Administered 2015-07-15: 1 mg via INTRAVENOUS
  Administered 2015-07-15 (×2): 2 mg via INTRAVENOUS
  Administered 2015-07-15: 1 mg via INTRAVENOUS

## 2015-07-15 MED ORDER — MIDAZOLAM HCL 5 MG/5ML IJ SOLN
INTRAMUSCULAR | Status: AC
  Filled 2015-07-15: qty 10

## 2015-07-15 NOTE — Telephone Encounter (Signed)
SEE SURGERY FOR RECTAL BLEEDING AND ANOSCOPY, DR. Lovell SheehanJENKINS OR DAVIS WITHIN THE NEXT MONTH.

## 2015-07-15 NOTE — Discharge Instructions (Signed)
You have internal hemorrhoids. YOU DID NOT HAVE ANY POLYPS. NO OBVIOUS SOURCE FOR YOUR RECTAL BLEEDING WAS IDENTIFIED.    DRINK WATER TO KEEP YOUR URINE LIGHT YELLOW.  FOLLOW A HIGH FIBER DIET. AVOID ITEMS THAT CAUSE BLOATING. SEE INFO BELOW.  USE PREPARATION H FOUR TIMES  A DAY IF NEEDED TO RELIEVE RECTAL PAIN/PRESSURE/BLEEDING.  SEE SURGERY WITHIN THE NEXT MONTH FOR RECTAL BLEEDING AND ANOSCOPY.  FOLLOW UP IN 6 MOS WITH OUR OFFICE.   Next colonoscopy in 10 years. Colonoscopy Care After Read the instructions outlined below and refer to this sheet in the next week. These discharge instructions provide you with general information on caring for yourself after you leave the hospital. While your treatment has been planned according to the most current medical practices available, unavoidable complications occasionally occur. If you have any problems or questions after discharge, call DR. Maddisen Vought, [phone].  ACTIVITY  You may resume your regular activity, but move at a slower pace for the next 24 hours.   Take frequent rest periods for the next 24 hours.   Walking will help get rid of the air and reduce the bloated feeling in your belly (abdomen).   No driving for 24 hours (because of the medicine (anesthesia) used during the test).   You may shower.   Do not sign any important legal documents or operate any machinery for 24 hours (because of the anesthesia used during the test).    NUTRITION  Drink plenty of fluids.   You may resume your normal diet as instructed by your doctor.   Begin with a light meal and progress to your normal diet. Heavy or fried foods are harder to digest and may make you feel sick to your stomach (nauseated).   Avoid alcoholic beverages for 24 hours or as instructed.    MEDICATIONS  You may resume your normal medications.   WHAT YOU CAN EXPECT TODAY  Some feelings of bloating in the abdomen.   Passage of more gas than usual.   Spotting  of blood in your stool or on the toilet paper  .  IF YOU HAD POLYPS REMOVED DURING THE COLONOSCOPY:  Eat a soft diet IF YOU HAVE NAUSEA, BLOATING, ABDOMINAL PAIN, OR VOMITING.    FINDING OUT THE RESULTS OF YOUR TEST Not all test results are available during your visit. DR. Darrick PennaFIELDS WILL CALL YOU WITHIN 7 DAYS OF YOUR PROCEDUE WITH YOUR RESULTS. Do not assume everything is normal if you have not heard from DR. Yadriel Kerrigan IN ONE WEEK, CALL HER OFFICE AT [phone].  SEEK IMMEDIATE MEDICAL ATTENTION AND CALL THE OFFICE: [phone] IF:  You have more than a spotting of blood in your stool.   Your belly is swollen (abdominal distention).   You are nauseated or vomiting.   You have a temperature over 101F.   You have abdominal pain or discomfort that is severe or gets worse throughout the day.  High-Fiber Diet A high-fiber diet changes your normal diet to include more whole grains, legumes, fruits, and vegetables. Changes in the diet involve replacing refined carbohydrates with unrefined foods. The calorie level of the diet is essentially unchanged. The Dietary Reference Intake (recommended amount) for adult males is 38 grams per day. For adult females, it is 25 grams per day. Pregnant and lactating women should consume 28 grams of fiber per day. Fiber is the intact part of a plant that is not broken down during digestion. Functional fiber is fiber that has been isolated from  the plant to provide a beneficial effect in the body. PURPOSE  Increase stool bulk.   Ease and regulate bowel movements.   Lower cholesterol.  REDUCE RISK OF COLON CANCER  INDICATIONS THAT YOU NEED MORE FIBER  Constipation and hemorrhoids.   Uncomplicated diverticulosis (intestine condition) and irritable bowel syndrome.   Weight management.   As a protective measure against hardening of the arteries (atherosclerosis), diabetes, and cancer.   GUIDELINES FOR INCREASING FIBER IN THE DIET  Start adding fiber  to the diet slowly. A gradual increase of about 5 more grams (2 slices of whole-wheat bread, 2 servings of most fruits or vegetables, or 1 bowl of high-fiber cereal) per day is best. Too rapid an increase in fiber may result in constipation, flatulence, and bloating.   Drink enough water and fluids to keep your urine clear or pale yellow. Water, juice, or caffeine-free drinks are recommended. Not drinking enough fluid may cause constipation.   Eat a variety of high-fiber foods rather than one type of fiber.   Try to increase your intake of fiber through using high-fiber foods rather than fiber pills or supplements that contain small amounts of fiber.   The goal is to change the types of food eaten. Do not supplement your present diet with high-fiber foods, but replace foods in your present diet.   INCLUDE A VARIETY OF FIBER SOURCES  Replace refined and processed grains with whole grains, canned fruits with fresh fruits, and incorporate other fiber sources. White rice, white breads, and most bakery goods contain little or no fiber.   Brown whole-grain rice, buckwheat oats, and many fruits and vegetables are all good sources of fiber. These include: broccoli, Brussels sprouts, cabbage, cauliflower, beets, sweet potatoes, white potatoes (skin on), carrots, tomatoes, eggplant, squash, berries, fresh fruits, and dried fruits.   Cereals appear to be the richest source of fiber. Cereal fiber is found in whole grains and bran. Bran is the fiber-rich outer coat of cereal grain, which is largely removed in refining. In whole-grain cereals, the bran remains. In breakfast cereals, the largest amount of fiber is found in those with "bran" in their names. The fiber content is sometimes indicated on the label.   You may need to include additional fruits and vegetables each day.   In baking, for 1 cup white flour, you may use the following substitutions:   1 cup whole-wheat flour minus 2 tablespoons.   1/2  cup white flour plus 1/2 cup whole-wheat flour.   Hemorrhoids Hemorrhoids are dilated (enlarged) veins around the rectum. Sometimes clots will form in the veins. This makes them swollen and painful. These are called thrombosed hemorrhoids. Causes of hemorrhoids include:  Constipation.   Straining to have a bowel movement.   HEAVY LIFTING  HOME CARE INSTRUCTIONS  Eat a well balanced diet and drink 6 to 8 glasses of water every day to avoid constipation. You may also use a bulk laxative.   Avoid straining to have bowel movements.   Keep anal area dry and clean.   Do not use a donut shaped pillow or sit on the toilet for long periods. This increases blood pooling and pain.   Move your bowels when your body has the urge; this will require less straining and will decrease pain and pressure.

## 2015-07-15 NOTE — Op Note (Signed)
[facility] Patient Name: [name]: 07/15/2015 9:42 AM MRN: [id number] [name] of Birth: [birth year] Attending MD: Jonette EvaSandi Fields , MD CSN: [id number] Age: [age] Admit Type: Outpatient Procedure:                Colonoscopy, DIAGNOSTIC Indications:              Hematochezia Providers:                Jonette EvaSandi Fields, MD, Edrick Kinsammy Vaught, RN, Calton Dachaylor Lemons,                            Technician Referring MD:             Pete Glatterawn T. Langeland Medicines:                Promethazine 12.5 mg IV, Meperidine 75 mg IV,                            Midazolam 6 mg IV Complications:            No immediate complications. Estimated Blood Loss:     Estimated blood loss was minimal. Procedure:                Pre-Anesthesia Assessment:                           - Prior to the procedure, a History and Physical                            was performed, and patient medications and                            allergies were reviewed. The patient's tolerance of                            previous anesthesia was also reviewed. The risks                            and benefits of the procedure and the sedation                            options and risks were discussed with the patient.                            All questions were answered, and informed consent                            was obtained. Prior Anticoagulants: The patient has                            taken no previous anticoagulant or antiplatelet                            agents. ASA Grade Assessment: II - A patient with  mild systemic disease. After reviewing the risks                            and benefits, the patient was deemed in                            satisfactory condition to undergo the procedure.                           After obtaining informed consent, the colonoscope                            was passed under direct vision. Throughout the                            procedure, the patient's  blood pressure, pulse, and                            oxygen saturations were monitored continuously. The                            [id number]) scope was introduced through                            the anus and advanced to the the cecum, identified                            by appendiceal orifice and ileocecal valve. The                            colonoscopy was technically difficult and complex                            due to significant looping. Successful completion                            of the procedure was aided by increasing the dose                            of sedation medication, straightening and                            shortening the scope to obtain bowel loop reduction                            and COLOWRAP. The patient tolerated the procedure                            fairly well. The quality of the bowel preparation                            was excellent. The ileocecal valve, appendiceal  orifice, and rectum were photographed. Scope In: 10:15:22 AM Scope Out: 10:36:29 AM Scope Withdrawal Time: 0 hours 18 minutes 16 seconds  Total Procedure Duration: 0 hours 21 minutes 7 seconds  Findings:      The recto-sigmoid colon was moderately redundant.      SMALL Non-bleeding internal hemorrhoids were found. The hemorrhoids were       medium-sized.      A few small-mouthed diverticula were found in the cecum. Impression:               - Redundant colon.                           - The examination was otherwise normal.                           - SMALL Non-bleeding internal hemorrhoids.                           - No SOURCE FOR RECTAL BLEEDING IDENTIFIED Moderate Sedation:      Moderate (conscious) sedation was administered by the endoscopy nurse       and supervised by the endoscopist. The following parameters were       monitored: oxygen saturation, heart rate, blood pressure, and response       to care. Total physician  intraservice time was 42 minutes. Recommendation:           - High fiber diet.                           - Continue present medications.                           - Repeat colonoscopy in 10 months for surveillance.                           DRINK WATER TO KEEP YOUR URINE LIGHT YELLOW.                           FOLLOW A HIGH FIBER DIET. AVOID ITEMS THAT CAUSE                            BLOATING.                           USE PREPARATION H FOUR TIMES A DAY IF NEEDED TO                            RELIEVE RECTAL PAIN/PRESSURE/BLEEDING.                           SEE SURGERY WITHIN THE NEXT MONTH FOR RECTAL                            BLEEDING AND ANOSCOPY.                           FOLLOW UP IN 6 MOS.                           -  Patient has a contact number available for                            emergencies. The signs and symptoms of potential                            delayed complications were discussed with the                            patient. Return to normal activities tomorrow.                            Written discharge instructions were provided to the                            patient. Procedure Code(s):        --- Professional ---                           [phone], Colonoscopy, flexible; diagnostic, including                            collection of specimen(s) by brushing or washing,                            when performed (separate procedure)                           99152, Moderate sedation services provided by the                            same physician or other qualified health care                            professional performing the diagnostic or                            therapeutic service that the sedation supports,                            requiring the presence of an independent trained                            observer to assist in the monitoring of the                            patient's level of consciousness and physiological                             status; initial 15 minutes of intraservice time,                            patient age 28 years or older  [address], Moderate sedation services; each additional                            15 minutes intraservice time                           99153, Moderate sedation services; each additional                            15 minutes intraservice time Diagnosis Code(s):        --- Professional ---                           K64.8, Other hemorrhoids                           K92.1, Melena (includes Hematochezia)                           Q43.8, Other specified congenital malformations of                            intestine CPT copyright 2016 [employer] Association. All rights reserved. The codes documented in this report are preliminary and upon coder review may  be revised to meet current compliance requirements. Jonette Eva, MD Jonette Eva, MD 07/15/2015 9:16:08 PM This report has been signed electronically. Number of Addenda: 0

## 2015-07-15 NOTE — Telephone Encounter (Signed)
REVIEWED-DR. [facility] IS [facility].

## 2015-07-15 NOTE — Telephone Encounter (Signed)
Routing to Ginger to send to Dr. Lovell SheehanJenkins office

## 2015-07-15 NOTE — Telephone Encounter (Signed)
Pt has [facility] so referral has been made to [facility]

## 2015-07-15 NOTE — Telephone Encounter (Signed)
Referral has been made to [facility] 

## 2015-07-15 NOTE — Progress Notes (Signed)
REVIEWED-NO ADDITIONAL RECOMMENDATIONS. 

## 2015-07-15 NOTE — H&P (Signed)
Primary Care Physician:  Maren Reamer, MD Primary Gastroenterologist:  Dr. Oneida Alar  Pre-Procedure History & Physical: HPI:  [name] is a 60 y.o. female here for  BRBPR.  Past Medical History  Diagnosis Date  . Hypertension   . Carpal tunnel syndrome   . Seizures [facility])     Past Surgical History  Procedure Laterality Date  . Abdominal hysterectomy    . Hernia repair    . Myomectomy      Prior to Admission medications   Medication Sig Start Date End Date Taking? Authorizing Provider  acetaminophen (TYLENOL) 500 MG tablet Take 500 mg by mouth every 6 (six) hours as needed.   Yes Historical Provider, MD  amLODipine (NORVASC) 10 MG tablet Take 1 tablet (10 mg total) by mouth daily. 11/20/14  Yes Lance Bosch, NP  levETIRAcetam (KEPPRA XR) 500 MG 24 hr tablet Take 2 tablets (1,000 mg total) by mouth 2 (two) times daily. 05/29/15  Yes Maren Reamer, MD  lisinopril-hydrochlorothiazide (ZESTORETIC) 20-12.5 MG tablet Take 2 tablets by mouth daily. 11/20/14  Yes Lance Bosch, NP  metFORMIN (GLUCOPHAGE) 500 MG tablet Take 1 tablet (500 mg total) by mouth 2 (two) times daily with a meal. 05/29/15  Yes Maren Reamer, MD  Oxymetazoline HCl (NASAL SPRAY NA) Place 1 spray into the nose daily as needed (allergies).    Yes Historical Provider, MD  peg 3350 powder (MOVIPREP) 100 g SOLR Take 1 kit by mouth.   Yes Historical Provider, MD  phosphate laxative (FLEET) 2.7-7.2 GM/15ML solution Take 15 mLs by mouth once. 07/03/15  Yes Carlis Stable, NP  triamcinolone (NASACORT AQ) 55 MCG/ACT AERO nasal inhaler Place 2 sprays into the nose daily. 05/29/15  Yes Maren Reamer, MD    Allergies as of 07/03/2015 - Review Complete 07/03/2015  Allergen Reaction Noted  . Codeine Anaphylaxis 08/24/2013  . Banana  10/05/2013  . Orange fruit [citrus]  10/05/2013  . Catfish [fish allergy] Rash 06/13/2014  . Shrimp [shellfish allergy] Nausea And Vomiting and Rash 10/05/2013  . Tomato Itching and Rash  08/24/2013    Family History  Problem Relation Age of Onset  . Asthma Mother   . Hypertension Father   . Heart disease Father   . Stroke Father   . Cancer Sister     uterus cancer   . Diabetes Maternal Uncle   . Colon cancer Neg Hx     Social History   Social History  . Marital Status: Divorced    Spouse Name: N/A  . Number of Children: 1  . Years of Education: N/A   Occupational History  . Not on file.   Social History Main Topics  . Smoking status: Never Smoker   . Smokeless tobacco: Never Used  . Alcohol Use: No  . Drug Use: No  . Sexual Activity: Not on file   Other Topics Concern  . Not on file   Social History Narrative    Review of Systems: See HPI, otherwise negative ROS   Physical Exam: BP 128/89 mmHg  Pulse 78  Temp(Src) 97.7 F (36.5 C) (Oral)  Resp 19  Ht _0  (1.575 m)  Wt 206 lb (93.441 kg)  BMI 37.67 kg/m2  SpO2 97% General:   Alert,  pleasant and cooperative in NAD Head:  Normocephalic and atraumatic. Neck:  Supple; Lungs:  Clear throughout to auscultation.    Heart:  Regular rate and rhythm. Abdomen:  Soft, nontender and nondistended. Normal bowel  sounds, without guarding, and without rebound.   Neurologic:  Alert and  oriented x4;  grossly normal neurologically.  Impression/Plan:      BRBPR.   Plan:  1. TCS TODAY

## 2015-07-16 MED FILL — metFORMIN HCL 500 MG TABS: 500 | 45 days supply | Qty: 90 | Fill #0

## 2015-07-16 MED FILL — LISINOPRIL-HCTZ 20-12.5 MG: 20-12.5 | 30 days supply | Qty: 60 | Fill #5

## 2015-07-16 MED FILL — GABAPENTIN 100 MG CAPSULE: 100 | 45 days supply | Qty: 90 | Fill #0

## 2015-07-17 ENCOUNTER — Encounter (HOSPITAL_COMMUNITY): Payer: Self-pay | Admitting: Gastroenterology

## 2015-07-17 NOTE — Telephone Encounter (Signed)
REVIEWED. AGREE. NO ADDITIONAL RECOMMENDATIONS. 

## 2015-07-17 NOTE — Telephone Encounter (Signed)
PER [facility]:  I  have contacted patient to cancel her appointment and she did not want to cancel. She would [facility] to [facility]. I did discuss the location and that she could be referred somewhere closer to home and she still did not want to cancel. She has transportation set up and will see us on 07/26/15. If any questions, please call me @ [phone]. Thank you for the referral.

## 2015-07-26 ENCOUNTER — Ambulatory Visit (INDEPENDENT_AMBULATORY_CARE_PROVIDER_SITE_OTHER): Payer: Self-pay | Admitting: General Surgery

## 2015-07-26 ENCOUNTER — Encounter: Payer: Self-pay | Admitting: General Surgery

## 2015-07-26 DIAGNOSIS — G629 Polyneuropathy, unspecified: Secondary | ICD-10-CM | POA: Insufficient documentation

## 2015-07-26 DIAGNOSIS — K625 Hemorrhage of anus and rectum: Secondary | ICD-10-CM

## 2015-07-26 MED ORDER — PSYLLIUM 58.6 % PO POWD: 1.0000 | Freq: Every day | ORAL | Status: DC

## 2015-07-26 MED ORDER — DOCUSATE SODIUM 100 MG PO CAPS: 100.0000 mg | ORAL_CAPSULE | Freq: Every day | ORAL | Status: DC

## 2015-07-26 NOTE — Progress Notes (Signed)
Patient ID: Kirsten RampRenee [name], female   DOB: 10/18/1955, 60 y.o.   MRN: [id number]  CC: Blood per rectum  HPI [name] is a 60 y.o. female who presents to clinic today for evaluation of hemorrhoids and bright red blood per rectum. Patient reports that on 4 separate instances over the last 6 months she has noticed bright red blood with wiping after bowel movement was. She also notes that she is alternating between diarrhea and constipation. Constipation is when she passes small hard stools sandwiched between loose watery stools. Patient is a very poor historian. She had a recent colonoscopy for this and was diagnosed with hemorrhoids but states she's never been taught anything about treatment of hemorrhoids such as avoidance of constipation and/or any treatment of hemorrhoids such as steroids. Patient states she's not had any blood per rectum for the last month. She denies any fevers, chills, nausea, vomiting. She does have chronic chest pain and shortness of breath as well as the aforementioned intermittent diarrhea and constipation.  HPI  Past Medical History  Diagnosis Date  . Hypertension   . Carpal tunnel syndrome   . Seizures (HCC)   . Hemorrhoid   . Rectal bleeding 07/03/2015  . DYSTONIA 01/04/2007    Qualifier: Diagnosis of  By: Humberto SealsSaxon NP, Darl PikesSusan    . Seizures (HCC) 08/24/2013  . Prediabetes 10/09/2013  . Obesity, unspecified 10/17/2007    Qualifier: Diagnosis of  By: Gerilyn PilgrimSykes PHD, Jeannie    . Facial droop 10/09/2013  . Major depressive disorder, recurrent episode (HCC) 03/18/2006    Qualifier: Diagnosis of  By: Bradly BienenstockHarrelson, Kathy    . Diabetes mellitus without complication [facility](HCC)     Past Surgical History  Procedure Laterality Date  . Abdominal hysterectomy    . Hernia repair    . Myomectomy    . Colonoscopy N/A 07/15/2015    Procedure: COLONOSCOPY;  Surgeon: West BaliSandi L Fields, MD;  Location: [facility];  Service: Endoscopy;  Laterality: N/A;  0945    Family History  Problem  Relation Age of Onset  . Asthma Mother   . Hypertension Father   . Heart disease Father   . Stroke Father   . Cancer Sister     uterus cancer   . Diabetes Maternal Uncle   . Colon cancer Neg Hx     Social History Social History  Substance Use Topics  . Smoking status: Never Smoker   . Smokeless tobacco: Never Used  . Alcohol Use: No    Allergies  Allergen Reactions  . Codeine Anaphylaxis  . Shellfish Allergy Swelling and Rash    Throat Sweling  . Banana Rash    Cannot eat any while taking KEPPRA  . Catfish [Fish Allergy] Rash  . Orange Fruit [Citrus] Rash    Cannot have while taking KEPPRA  . Tomato Itching and Rash    Current Outpatient Prescriptions  Medication Sig Dispense Refill  . acetaminophen (TYLENOL) 500 MG tablet Take 500 mg by mouth every 6 (six) hours as needed.    [name]. amLODipine (NORVASC) 10 MG tablet Take 1 tablet (10 mg total) by mouth daily. 90 tablet 3  . Cholecalciferol (VITAMIN D-3) 1000 units CAPS Take 1 capsule by mouth daily.    [name]. gabapentin (NEURONTIN) 100 MG capsule Take 100 mg by mouth 2 (two) times daily.    [name]. levETIRAcetam (KEPPRA XR) 500 MG 24 hr tablet Take 2 tablets (1,000 mg total) by mouth 2 (two) times daily. 120 tablet 2  . lisinopril-hydrochlorothiazide (  ZESTORETIC) 20-12.5 MG tablet Take 2 tablets by mouth daily. 180 tablet 3  . metFORMIN (GLUCOPHAGE) 500 MG tablet Take 1 tablet (500 mg total) by mouth 2 (two) times daily with a meal. 90 tablet 3  . triamcinolone (NASACORT AQ) 55 MCG/ACT AERO nasal inhaler Place 2 sprays into the nose daily. 1 Inhaler 12  . docusate sodium (COLACE) 100 MG capsule Take 1 capsule (100 mg total) by mouth daily. 10 capsule 0  . psyllium (METAMUCIL SMOOTH TEXTURE) 58.6 % powder Take 1 packet by mouth daily. 174 g 0   No current facility-administered medications for this visit.     Review of Systems A Multi-point review of systems was asked and was negative except for the findings documented in the history  of present illness  Physical Exam Blood pressure 150/89, pulse 73, temperature 98.8 F (37.1 C), temperature source Oral, height 5\' 2"  (1.575 m), weight 95.165 kg (209 lb 12.8 oz). CONSTITUTIONAL: No acute distress. EYES: Pupils are equal, round, and reactive to light, Sclera are non-icteric. EARS, NOSE, MOUTH AND THROAT: The oropharynx is clear. The oral mucosa is pink and moist. Hearing is intact to voice. LYMPH NODES:  Lymph nodes in the neck are normal. RESPIRATORY:  Lungs are clear.  CARDIOVASCULAR: Heart is regular  GI: The abdomen is soft, nontender, and nondistended. ts. GU: Rectal exam shows evidence of grade 2 internal hemorrhoids and a single decompressed external hemorrhoidal skin tag on the patient's left side no evidence of active bleeding.   MUSCULOSKELETAL: Normal muscle strength and tone. No cyanosis or edema.   SKIN: Turgor is good and there are no pathologic skin lesions or ulcers. NEUROLOGIC: Motor and sensation is grossly normal. Cranial nerves are grossly intact. PSYCH:  Oriented to person, place and time.  Data Reviewed Previous colonoscopy reviewed which shows endoscopic evidence of internal hemorrhoids I have personally reviewed the patient's imaging, laboratory findings and medical records.    Assessment    Intermittent rectal bleeding    Plan    60 year old female with intermittent rectal bleeding most likely secondary to internal hemorrhoids and poor bowel habits. Discussed at length with the patient that the primary treatment of internal hemorrhoids is to prevent constipation in order to minimize inflammation. Discussed appropriate hydration, fiber usage and the instillation of stool softeners. Patient appeared to voice understanding. She will either call us or her primary care provider should she have another bout of rectal bleeding at which point she would be prescribed a steroid suppository for her initial line of treatment. Discussed that there is no  current indications for surgical intervention in this patient.     Time spent with the patient was 45 minutes, with more than 50% of the time spent in face-to-face education, counseling and care coordination.     Ricarda Frameharles Humza Tallerico, MD FACS General Surgeon 07/26/2015, 2:59 PM

## 2015-07-26 NOTE — Patient Instructions (Addendum)
We have spoken about your hemorhoids and constipation today. We encourage fiber use and a stool softener to help with easing constipation. See more literature below.  Your stool softener and Fiber supplement has been sent [facility].     Constipation, Adult Constipation is when a person has fewer than three bowel movements a week, has difficulty having a bowel movement, or has stools that are dry, hard, or larger than normal. As people grow older, constipation is more common. A low-fiber diet, not taking in enough fluids, and taking certain medicines may make constipation worse.  CAUSES   Certain medicines, such as antidepressants, pain medicine, iron supplements, antacids, and water pills.   Certain diseases, such as diabetes, irritable bowel syndrome (IBS), thyroid disease, or depression.   Not drinking enough water.   Not eating enough fiber-rich foods.   Stress or travel.   Lack of physical activity or exercise.   Ignoring the urge to have a bowel movement.   Using laxatives too much.  SIGNS AND SYMPTOMS   Having fewer than three bowel movements a week.   Straining to have a bowel movement.   Having stools that are hard, dry, or larger than normal.   Feeling full or bloated.   Pain in the lower abdomen.   Not feeling relief after having a bowel movement.  DIAGNOSIS  Your health care provider will take a medical history and perform a physical exam. Further testing may be done for severe constipation. Some tests may include:  A barium enema X-ray to examine your rectum, colon, and, sometimes, your small intestine.   A sigmoidoscopy to examine your lower colon.   A colonoscopy to examine your entire colon. TREATMENT  Treatment will depend on the severity of your constipation and what is causing it. Some dietary treatments include drinking more fluids and eating more fiber-rich foods. Lifestyle treatments may include regular  exercise. If these diet and lifestyle recommendations do not help, your health care provider may recommend taking over-the-counter laxative medicines to help you have bowel movements. Prescription medicines may be prescribed if over-the-counter medicines do not work.  HOME CARE INSTRUCTIONS   Eat foods that have a lot of fiber, such as fruits, vegetables, whole grains, and beans.  Limit foods high in fat and processed sugars, such as french fries, hamburgers, cookies, candies, and soda.   A fiber supplement may be added to your diet if you cannot get enough fiber from foods.   Drink enough fluids to keep your urine clear or pale yellow.   Exercise regularly or as directed by your health care provider.   Go to the restroom when you have the urge to go. Do not hold it.   Only take over-the-counter or prescription medicines as directed by your health care provider. Do not take other medicines for constipation without talking to your health care provider first.  SEEK IMMEDIATE MEDICAL CARE IF:   You have bright red blood in your stool.   Your constipation lasts for more than 4 days or gets worse.   You have abdominal or rectal pain.   You have thin, pencil-like stools.   You have unexplained weight loss. MAKE SURE YOU:   Understand these instructions.  Will watch your condition.  Will get help right away if you are not doing well or get worse.   This information is not intended to replace advice given to you by your health care provider. Make sure you discuss any questions  you have with your health care provider.   Document Released: 10/04/2003 Document Revised: 01/26/2014 Document Reviewed: 10/17/2012 Elsevier Interactive Patient Education 2016 [employer].  Metamucil (Fiber_ What is this medicine? PSYLLIUM (SIL i yum) is a bulk-forming fiber laxative. This medicine is used to treat constipation. This medicine may be used for other purposes; ask your health care  provider or pharmacist if you have questions. What should I tell my health care provider before I take this medicine? They need to know if you have any of these conditions: -change in bowel habits for more than 14 days -blocked intestines or bowel -phenylketonuria -stomach pain, nausea, or vomiting -trouble swallowing -an unusual or allergic reaction to psyllium, tartrazine dye, other medicines, dyes, or preservatives -pregnant or trying or get pregnant -breast-feeding How should I use this medicine? Mix this medicine into a full glass of water or other drink. Take by mouth. Follow the directions on the package labeling, or take as directed by your health care professional. Mix this medicine with 8 ounces or more of fluid. If the drink thickens add more fluid before drinking. Take your medicine at regular intervals. Do not take your medicine more often than directed. Talk to your pediatrician regarding the use of this medicine in children. While this drug may be prescribed for children as young as 852 years old for selected conditions, precautions do apply. Overdosage: If you think you have taken too much of this medicine contact a poison control center or emergency room at once. NOTE: This medicine is only for you. Do not share this medicine with others. What if I miss a dose? If you miss a dose, take it as soon as you can. If it is almost time for your next dose, take only that dose. Do not take double or extra doses. What may interact with this medicine? Interactions are not expected. This list may not describe all possible interactions. Give your health care provider a list of all the medicines, herbs, non-prescription drugs, or dietary supplements you use. Also tell them if you smoke, drink alcohol, or use illegal drugs. Some items may interact with your medicine. What should I watch for while using this medicine? This medicine can take up to 3 days to work. Check with your doctor or health  care professional if your symptoms do not start to get better or if they get worse. See your doctor if you have to treat your constipation for more than 1 week. Avoid taking other medicines within 2 hours of taking this medicine. Drink several glasses of water a day while you are taking this medicine. This will help to relieve constipation and prevent dehydration. Avoid breathing in this medicine. This can cause an allergic reaction or make it difficult to breathe. What side effects may I notice from receiving this medicine? Side effects that you should report to your doctor or health care professional as soon as possible: -allergic reactions like skin rash, itching or hives, swelling of the face, lips, or tongue -breathing problems -chest pain -nausea, vomiting -rectal bleeding -trouble swallowing Side effects that usually do not require medical attention (report to your doctor or health care professional if they continue or are bothersome): -bloated or 'gassy' feeling -diarrhea -headache -stomach cramps This list may not describe all possible side effects. Call your doctor for medical advice about side effects. You may report side effects to FDA at 1-800-FDA-1088. Where should I keep my medicine? Keep out of the reach of children. Store at room  temperature between 15 and 30 degrees C (59 and 86 degrees F). Protect from moisture. Throw away any unused medicine after the expiration date. NOTE: This sheet is a summary. It may not cover all possible information. If you have questions about this medicine, talk to your doctor, pharmacist, or health care provider.    2016, Elsevier/Gold Standard. (2007-08-08 16:57:28)

## 2015-07-29 MED FILL — LORATADINE 10 MG TABLET: 10 | 30 days supply | Qty: 30 | Fill #1

## 2015-08-09 ENCOUNTER — Encounter: Payer: Self-pay | Admitting: Neurology

## 2015-08-09 ENCOUNTER — Ambulatory Visit (INDEPENDENT_AMBULATORY_CARE_PROVIDER_SITE_OTHER): Payer: Self-pay | Admitting: Neurology

## 2015-08-09 VITALS — BP 120/64 | HR 82 | Ht 62.0 in | Wt 212.0 lb

## 2015-08-09 DIAGNOSIS — R569 Unspecified convulsions: Secondary | ICD-10-CM

## 2015-08-09 NOTE — Progress Notes (Signed)
NEUROLOGY FOLLOW UP OFFICE NOTE  [name] Farler [id number]  HISTORY OF PRESENT ILLNESS: I had the pleasure of seeing [name] Discher in follow-up in the neurology clinic on 08/09/2015.  The patient was last seen 8 months ago for a diagnosis of seizures. She was having recurrent episodes of loss of consciousness with report of right-sided symptoms initially, but recently symptoms are preceded by pain and discomfort in her extremities (LE>UE). 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 She was started on Keppra 1000mg  BID on her last visit to [facility] last 2015 for seizures. She denies any further spells since November 2016, tolerating Keppra XR 1000mg  BID with no side effects. She reports a dizzy spell last 7/9 when she got so excited for her brother's birthday. She has occasional tremors in her hands when she gets anxious. She has some anxiety about her [place] court hearing, and reports being stressed about this and not sleeping. Her hair has been falling out. She denies any headaches, vision changes, focal numbness/tingling/weakness. She has had some constipation improved with metamucil.[name].  HPI: This is a pleasant 60 yo RH woman with a history of hypertension and a diagnosis of seizures. She reports that neurological symptoms started in 2005 when she would have involuntary movements of the right leg. She states she had lost her grandmother and this had an effect on her, and had symptoms since then. She had seen neurologist Dr. Aletha HalimAndreas Runheim at that time and was diagnosed with focal limb dystomia, told that this may be caused by a chemical imbalance. She was treated with Botox but did not tolerate it. She reports seeing Dr. Estella Huskunheim from 2006 to 2009, but also at the same time she started having blackouts. She reports that she had a blackout in her doctor's office one time, but instead of being brought to [facility] ER, she was [facility] to [facility]. She has had blackouts so bad that neighbors would tell her she needed to go to the ER. She usually starts feeling pain in both legs, followed by right-sided shaking, numbness on the left side of her face, throat gets tight, then she falls to the floor. She lives by herself and has woken up on the floor. She reports that she can see herself shaking and would go into a fetal position to "ride it out."   In August 2015, she was brought to the ER due to recent increase in frequency of spells. She reported bilateral numbness and tingling as well as a feeling of discomfort in both upper and lower extremities. She has been told she has convulsions involving her legs. She reported urinary incontinence and tongue soreness. Her BP was very high at that time. 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 No definite gliosis of heterotopic gray matter. Her routine EEG was normal. She was discharged home on Keppra 1000mg  BID which she has been tolerating without side effects. She reports doing well with no similar symptoms until the October 2016. She brings a calendar of her events, She had one last 10/30/14. She reports she had been stressed because her daughter told her that day that she is moving to [place] to what her mother thinks is an unhealthy relationship. She had another episode on 10/30 while walking. On 11/25/14 she had 2 episodes, one lasting  12 minutes, another one lasting an hour. She reports being in the shower, she grabbed the handle and sat in the tub rocking in a fetal position, which she found helps. She believes stress is causing the recent seizures and talking to a therapist does help her. She states she does not know how to relax. She had a normal birth and reports being in special education  until the 12th grade. There is no history of febrile convulsions, CNS infections such as meningitis/encephalitis, significant traumatic brain injury, neurosurgical procedures, or family history of seizures.  PAST MEDICAL HISTORY: Past Medical History  Diagnosis Date  . Hypertension   . Carpal tunnel syndrome   . Seizures (HCC)   . Hemorrhoid   . Rectal bleeding 07/03/2015  . DYSTONIA 01/04/2007    Qualifier: Diagnosis of  By: Humberto Seals NP, Darl Pikes    . Seizures (HCC) 08/24/2013  . Prediabetes 10/09/2013  . Obesity, unspecified 10/17/2007    Qualifier: Diagnosis of  By: Gerilyn Pilgrim PHD, Jeannie    . Facial droop 10/09/2013  . Major depressive disorder, recurrent episode (HCC) 03/18/2006    Qualifier: Diagnosis of  By: Bradly Bienenstock    . Diabetes mellitus without complication [facility])     MEDICATIONS: Current Outpatient Prescriptions on File Prior to Visit  Medication Sig Dispense Refill  . acetaminophen (TYLENOL) 500 MG tablet Take 500 mg by mouth every 6 (six) hours as needed.    [name] amLODipine (NORVASC) 10 MG tablet Take 1 tablet (10 mg total) by mouth daily. 90 tablet 3  . Cholecalciferol (VITAMIN D-3) 1000 units CAPS Take 1 capsule by mouth daily.    [name] docusate sodium (COLACE) 100 MG capsule Take 1 capsule (100 mg total) by mouth daily. 10 capsule 0  . gabapentin (NEURONTIN) 100 MG capsule Take 100 mg by mouth 2 (two) times daily.    [name] levETIRAcetam (KEPPRA XR) 500 MG 24 hr tablet Take 2 tablets (1,000 mg total) by mouth 2 (two) times daily. 120 tablet 2  . lisinopril-hydrochlorothiazide (ZESTORETIC) 20-12.5 MG tablet Take 2 tablets by mouth daily. 180 tablet 3  . metFORMIN (GLUCOPHAGE) 500 MG tablet Take 1 tablet (500 mg total) by mouth 2 (two) times daily with a meal. 90 tablet 3  . psyllium (METAMUCIL SMOOTH TEXTURE) 58.6 % powder Take 1 packet by mouth daily. 174 g 0  . triamcinolone (NASACORT AQ) 55 MCG/ACT AERO nasal inhaler Place 2 sprays into the nose daily. 1 Inhaler 12   No current  facility-administered medications on file prior to visit.    ALLERGIES: Allergies  Allergen Reactions  . Codeine Anaphylaxis  . Shellfish Allergy Swelling and Rash    Throat Sweling  . Banana Rash    Cannot eat any while taking KEPPRA  . Catfish [Fish Allergy] Rash  . Orange Fruit [Citrus] Rash    Cannot have while taking KEPPRA  . Tomato Itching and Rash    FAMILY HISTORY: Family History  Problem Relation Age of Onset  . Asthma Mother   . Hypertension Father   . Heart disease Father   . Stroke Father   . Cancer Sister     uterus cancer   . Diabetes Maternal Uncle   . Colon cancer Neg Hx     SOCIAL HISTORY: Social History   Social History  . Marital Status: Divorced    Spouse Name: N/A  . Number of Children: 1  . Years of Education: N/A   Occupational History  . Not on file.   Social  History Main Topics  . Smoking status: Never Smoker   . Smokeless tobacco: Never Used  . Alcohol Use: No  . Drug Use: No  . Sexual Activity: Not on file   Other Topics Concern  . Not on file   Social History Narrative    REVIEW OF SYSTEMS: Constitutional: No fevers, chills, or sweats, no generalized fatigue, change in appetite Eyes: No visual changes, double vision, eye pain Ear, nose and throat: No hearing loss, ear pain, nasal congestion, sore throat Cardiovascular: No chest pain, palpitations Respiratory:  No shortness of breath at rest or with exertion, wheezes GastrointestinaI: No nausea, vomiting, diarrhea, abdominal pain, fecal incontinence Genitourinary:  No dysuria, urinary retention or frequency Musculoskeletal:  No neck pain, back pain Integumentary: No rash, pruritus, skin lesions Neurological: as above Psychiatric: + depression, insomnia, anxiety Endocrine: No palpitations, fatigue, diaphoresis, mood swings, change in appetite, change in weight, increased thirst Hematologic/Lymphatic:  No anemia, purpura, petechiae. Allergic/Immunologic: no itchy/runny  eyes, nasal congestion, recent allergic reactions, rashes  PHYSICAL EXAM: Filed Vitals:   08/09/15 1556  BP: 120/64  Pulse: 82   General: No acute distress Head:  Normocephalic/atraumatic Neck: supple, no paraspinal tenderness, full range of motion Heart:  Regular rate and rhythm Lungs:  Clear to auscultation bilaterally Back: No paraspinal tenderness Skin/Extremities: No rash, no edema Neurological Exam: alert and oriented to person, place, and time. No aphasia or dysarthria. Fund of knowledge is appropriate.  Recent and remote memory are intact.  Attention and concentration are normal.    Able to name objects and repeat phrases. Cranial nerves: Pupils equal, round, reactive to light.  Extraocular movements intact with no nystagmus, right esotropia. Visual fields full. Facial sensation intact. No facial asymmetry. Tongue, uvula, palate midline.  Motor: Bulk and tone normal, muscle strength 5/5 throughout with no pronator drift.  Sensation to light touch intact.  No extinction to double simultaneous stimulation.  Deep tendon reflexes 2+ throughout, toes downgoing.  Finger to nose testing intact.  Gait narrow-based and steady, able to tandem walk adequately.  Romberg negative.  IMPRESSION: This is a pleasant 60 yo RH woman with a history of hypertension and recurrent episodes of loss of consciousness with report of right-sided symptoms initially, but recently symptoms are preceded by pain and discomfort in her extremities (LE>UE). 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 She however reports that she had been event-free for a year since starting Keppra, until  October/November 2016 when her daughter told her she is moving to [place] (again raising concern for PNES). No further episodes since then, continue Keppra XR  BID. She will discuss hair loss with her PCP. If seizures recur, she will be a good candidate for video EEG monitoring to classify seizures. She does not drive. She will follow-up in 6 months or earlier if needed.   Thank you for allowing me to participate in her care.  Please do not hesitate to call for any questions or concerns.  The duration of this appointment visit was 15 minutes of face-to-face time with the patient.  Greater than 50% of this time was spent in counseling, explanation of diagnosis, planning of further management, and coordination of care.  Patrcia Dolly, M.D.   CC: Dierdre Searles, MD

## 2015-08-09 NOTE — Patient Instructions (Signed)
1. Continue all your medications 2. Follow-up in 6 months, call for any changes  Seizure Precautions: 1. If medication has been prescribed for you to prevent seizures, take it exactly as directed.  Do not stop taking the medicine without talking to your doctor first, even if you have not had a seizure in a long time.   2. Avoid activities in which a seizure would cause danger to yourself or to others.  Don't operate dangerous machinery, swim alone, or climb in high or dangerous places, such as on ladders, roofs, or girders.  Do not drive unless your doctor says you may.  3. If you have any warning that you may have a seizure, lay down in a safe place where you can't hurt yourself.    4.  No driving for 6 months from last seizure, as per [place] state law.   Please refer to the following link on the Epilepsy Foundation of America's website for more information: http://www.epilepsyfoundation.org/answerplace/Social/driving/drivingu.cfm   5.  Maintain good sleep hygiene. Avoid alcohol.  6.  Contact your doctor if you have any problems that may be related to the medicine you are taking.  7.  Call 911 and bring the patient back to the ED if:        A.  The seizure lasts longer than 5 minutes.       B.  The patient doesn't awaken shortly after the seizure  C.  The patient has new problems such as difficulty seeing, speaking or moving  D.  The patient was injured during the seizure  E.  The patient has a temperature over 102 F (39C)  F.  The patient vomited and now is having trouble breathing         

## 2015-08-13 ENCOUNTER — Telehealth: Payer: Self-pay | Admitting: Gastroenterology

## 2015-08-13 NOTE — Telephone Encounter (Signed)
[name] from Dr Lovell Sheehan' office called to let us know that patient was a no show for her appointment.

## 2015-08-13 NOTE — Telephone Encounter (Signed)
Noted  

## 2015-08-19 ENCOUNTER — Ambulatory Visit: Payer: Self-pay | Admitting: Neurology

## 2015-08-19 NOTE — Telephone Encounter (Signed)
PT SAW DR. CHARLES WOODHAM.

## 2015-08-22 ENCOUNTER — Other Ambulatory Visit: Payer: Self-pay | Admitting: Pharmacist

## 2015-08-22 DIAGNOSIS — I10 Essential (primary) hypertension: Secondary | ICD-10-CM

## 2015-08-22 MED ORDER — AMLODIPINE BESYLATE 10 MG PO TABS: 10.0000 mg | ORAL_TABLET | Freq: Every day | ORAL | 0 refills | Status: DC

## 2015-08-22 MED FILL — AMLODIPINE BESYLATE 10 MG T: 10 | 30 days supply | Qty: 30 | Fill #0

## 2015-08-22 MED FILL — LISINOPRIL-HCTZ 20-12.5 MG: 20-12.5 | 30 days supply | Qty: 60 | Fill #6

## 2015-09-03 MED FILL — ?METFORMIN HCL 500MG TABLET: 500 | 30 days supply | Qty: 60 | Fill #1

## 2015-09-10 ENCOUNTER — Encounter (HOSPITAL_COMMUNITY): Payer: Self-pay | Admitting: Licensed Clinical Social Worker

## 2015-09-10 ENCOUNTER — Ambulatory Visit (INDEPENDENT_AMBULATORY_CARE_PROVIDER_SITE_OTHER): Payer: No Typology Code available for payment source | Admitting: Licensed Clinical Social Worker

## 2015-09-10 ENCOUNTER — Encounter (INDEPENDENT_AMBULATORY_CARE_PROVIDER_SITE_OTHER): Payer: Self-pay

## 2015-09-10 DIAGNOSIS — F331 Major depressive disorder, recurrent, moderate: Secondary | ICD-10-CM
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
## 2015-09-19 ENCOUNTER — Ambulatory Visit (INDEPENDENT_AMBULATORY_CARE_PROVIDER_SITE_OTHER): Payer: No Typology Code available for payment source | Admitting: Licensed Clinical Social Worker

## 2015-09-19 ENCOUNTER — Encounter (HOSPITAL_COMMUNITY): Payer: Self-pay | Admitting: Licensed Clinical Social Worker

## 2015-09-19 DIAGNOSIS — F331 Major depressive disorder, recurrent, moderate: Secondary | ICD-10-CM

## 2015-09-19 MED FILL — AMLODIPINE BESYLATE 10 MG T: 10 | 30 days supply | Qty: 30 | Fill #1

## 2015-09-19 MED FILL — LISINOPRIL-HCTZ 20-12.5 MG: 20-12.5 | 30 days supply | Qty: 60 | Fill #7

## 2015-09-19 MED FILL — LORATADINE 10 MG TABLET: 10 | 30 days supply | Qty: 30 | Fill #2

## 2015-09-19 NOTE — Progress Notes (Signed)
   THERAPIST PROGRESS NOTE  Session Time: 9:10-10am  Participation Level: Active  Behavioral Response: CasualAlert  Type of Therapy: Individual Therapy  Treatment Goals addressed: Coping  Interventions: Solution Focused  Summary: [name] is a 60 y.o. female. Pt presents asking for assistance. She has been having problems having [facility]. I [facility] and spoke with the pharmacist as well as having her current medications refilled. Pt asked about transportation to dr appts. [facility]. Filled out application for pt. Pt reports she is not feeling as depressed. Pt is anxiously waiting on her disability case to be resolved. It is now on appeal. Pt has good relationships with most of her siblings who are her support. Worked with pt on guided meditation for depression. Pt was receptive to the intervention.  Suicidal/Homicidal: Nowithout intent/plan  Therapist Response: Assessed pt'[name] current functioning and reviewed progress. Assisted pt with her current needs and assistance in processing her management of stressors.  Plan: Return again in 2 weeks.  Diagnosis: Axis I: Major Depressive Disorder, recurrent episode moderate        [name], LCAS-A 09/19/2015

## 2015-09-23 ENCOUNTER — Encounter (HOSPITAL_COMMUNITY): Payer: Self-pay | Admitting: Student-PharmD

## 2015-10-01 MED FILL — ?METFORMIN HCL 500MG TABLET: 500 | 30 days supply | Qty: 60 | Fill #2

## 2015-10-03 ENCOUNTER — Telehealth (HOSPITAL_COMMUNITY): Payer: Self-pay | Admitting: Licensed Clinical Social Worker

## 2015-10-07 ENCOUNTER — Ambulatory Visit: Payer: Self-pay | Attending: Internal Medicine

## 2015-10-10 ENCOUNTER — Telehealth: Payer: Self-pay | Admitting: Internal Medicine

## 2015-10-10 ENCOUNTER — Ambulatory Visit (INDEPENDENT_AMBULATORY_CARE_PROVIDER_SITE_OTHER): Payer: Self-pay | Admitting: Licensed Clinical Social Worker

## 2015-10-10 ENCOUNTER — Encounter (HOSPITAL_COMMUNITY): Payer: Self-pay | Admitting: Licensed Clinical Social Worker

## 2015-10-10 DIAGNOSIS — F331 Major depressive disorder, recurrent, moderate: Secondary | ICD-10-CM

## 2015-10-10 NOTE — Progress Notes (Signed)
THERAPIST PROGRESS NOTE  Session Time: 9:10-10am  Participation Level: Active  Behavioral Response: CasualAlert  Type of Therapy: Individual Therapy  Treatment Goals addressed: Coping  Interventions: Solution Focused  Summary: [name] is a 60 y.o. female. Pt presents happy. After last session she completed all tasks for  Assistance. She now has a case Production designer, theatre/television/filmmanager at [facility] [facility] who is helping her with her medications. Pt reports she is not feeling as depressed. Pt is anxiously waiting on her disability case to be resolved. It is now on appeal. She spoke the attorney last week and was told they should hear from the appeal soon. They were supposed to hear by the end of August. Pt has good relationships with most of her siblings who are her support. [place] and will stay some extra days. Pt reports she has not followed through with meditation so I showed her how the meditation works and where to find guided imagery on youtube.Pt was receptive to the intervention.  Suicidal/Homicidal: Nowithout intent/plan  Therapist Response: Assessed pt's current functioning and reviewed progress. Assisted pt with her current needs and assistance in processing her management of stressors.  Plan: Return again in 2 weeks.  Diagnosis:      Axis I: Major Depressive Disorder, recurrent episode moderate                              Kimberlye Dilger S, LCAS-A 10/10/15

## 2015-10-10 NOTE — Telephone Encounter (Signed)
Contacted pt to see if she would be able to contact [facility] and see if they will be able to fax a form to us so that she will be able to get medication. Pt states she will call them and a fax number was given to pt

## 2015-10-10 NOTE — Telephone Encounter (Signed)
Pt calling stating that she spoke with [facility] patient assistance program and they stated that a new application needs to be filled out for Pts Keppra Rx.  Pt states she was thought PCP filled out application for pt to receive Rx until December, but [facility] stated it was only for a 90 day supply  Pt is requesting PCP to contact [facility] on pts behalf to extend the supply until December

## 2015-10-15 ENCOUNTER — Encounter: Payer: Self-pay | Admitting: Internal Medicine

## 2015-10-15 ENCOUNTER — Ambulatory Visit: Payer: Self-pay | Attending: Internal Medicine | Admitting: Internal Medicine

## 2015-10-15 VITALS — BP 135/84 | HR 92 | Temp 97.9°F | Resp 16 | Wt 208.4 lb

## 2015-10-15 DIAGNOSIS — G40909 Epilepsy, unspecified, not intractable, without status epilepticus: Secondary | ICD-10-CM

## 2015-10-15 DIAGNOSIS — Z23 Encounter for immunization: Secondary | ICD-10-CM

## 2015-10-15 DIAGNOSIS — I10 Essential (primary) hypertension: Secondary | ICD-10-CM

## 2015-10-15 DIAGNOSIS — E119 Type 2 diabetes mellitus without complications: Secondary | ICD-10-CM

## 2015-10-15 LAB — POCT GLYCOSYLATED HEMOGLOBIN (HGB A1C): HEMOGLOBIN A1C: 5.8

## 2015-10-15 LAB — GLUCOSE, POCT (MANUAL RESULT ENTRY): POC GLUCOSE: 5.8 mg/dL — AB (ref 70–99)

## 2015-10-15 MED ORDER — DOCUSATE SODIUM 100 MG PO CAPS: 100.0000 mg | ORAL_CAPSULE | Freq: Every day | ORAL | 1 refills | Status: DC | PRN

## 2015-10-15 MED ORDER — METFORMIN HCL 500 MG PO TABS: 500.0000 mg | ORAL_TABLET | Freq: Every day | ORAL | 3 refills | Status: DC

## 2015-10-15 MED ORDER — LISINOPRIL-HYDROCHLOROTHIAZIDE 20-12.5 MG PO TABS: 2.0000 | ORAL_TABLET | Freq: Every day | ORAL | 3 refills | Status: DC

## 2015-10-15 MED ORDER — LEVETIRACETAM ER 500 MG PO TB24: 1000.0000 mg | ORAL_TABLET | Freq: Two times a day (BID) | ORAL | 3 refills | Status: DC

## 2015-10-15 MED ORDER — AMLODIPINE BESYLATE 10 MG PO TABS: 10.0000 mg | ORAL_TABLET | Freq: Every day | ORAL | 3 refills | Status: DC

## 2015-10-15 MED FILL — GABAPENTIN 100 MG CAPSULE: 100 | 45 days supply | Qty: 90 | Fill #1

## 2015-10-15 MED FILL — AMLODIPINE BESYLATE 10 MG T: 10 | 30 days supply | Qty: 30 | Fill #2

## 2015-10-15 MED FILL — LISINOPRIL-HCTZ 20-12.5 MG: 20-12.5 | 30 days supply | Qty: 60 | Fill #8

## 2015-10-15 MED FILL — AMLODIPINE BESYLATE 10 MG T: 10 | 30 days supply | Qty: 30 | Fill #0

## 2015-10-15 MED FILL — LORATADINE 10 MG TABLET: 10 | 30 days supply | Qty: 30 | Fill #3

## 2015-10-15 NOTE — Patient Instructions (Signed)
DASH Eating Plan °DASH stands for "Dietary Approaches to Stop Hypertension." The DASH eating plan is a healthy eating plan that has been shown to reduce high blood pressure (hypertension). Additional health benefits may include reducing the risk of type 2 diabetes mellitus, heart disease, and stroke. The DASH eating plan may also help with weight loss. °WHAT DO I NEED TO KNOW ABOUT THE DASH EATING PLAN? °For the DASH eating plan, you will follow these general guidelines: °· Choose foods with a percent daily value for sodium of less than 5% (as listed on the food label). °· Use salt-free seasonings or herbs instead of table salt or sea salt. °· Check with your health care provider or pharmacist before using salt substitutes. °· Eat lower-sodium products, often labeled as "lower sodium" or "no salt added." °· Eat fresh foods. °· Eat more vegetables, fruits, and low-fat dairy products. °· Choose whole grains. Look for the word "whole" as the first word in the ingredient list. °· Choose fish and skinless chicken or turkey more often than red meat. Limit fish, poultry, and meat to 6 oz (170 g) each day. °· Limit sweets, desserts, sugars, and sugary drinks. °· Choose heart-healthy fats. °· Limit cheese to 1 oz (28 g) per day. °· Eat more home-cooked food and less restaurant, buffet, and fast food. °· Limit fried foods. °· Cook foods using methods other than frying. °· Limit canned vegetables. If you do use them, rinse them well to decrease the sodium. °· When eating at a restaurant, ask that your food be prepared with less salt, or no salt if possible. °WHAT FOODS CAN I EAT? °Seek help from a dietitian for individual calorie needs. °Grains °Whole grain or whole wheat bread. Brown rice. Whole grain or whole wheat pasta. Quinoa, bulgur, and whole grain cereals. Low-sodium cereals. Corn or whole wheat flour tortillas. Whole grain cornbread. Whole grain crackers. Low-sodium crackers. °Vegetables °Fresh or frozen vegetables  (raw, steamed, roasted, or grilled). Low-sodium or reduced-sodium tomato and vegetable juices. Low-sodium or reduced-sodium tomato sauce and paste. Low-sodium or reduced-sodium canned vegetables.  °Fruits °All fresh, canned (in natural juice), or frozen fruits. °Meat and Other Protein Products °Ground beef (85% or leaner), grass-fed beef, or beef trimmed of fat. Skinless chicken or turkey. Ground chicken or turkey. Pork trimmed of fat. All fish and seafood. Eggs. Dried beans, peas, or lentils. Unsalted nuts and seeds. Unsalted canned beans. °Dairy °Low-fat dairy products, such as skim or 1% milk, 2% or reduced-fat cheeses, low-fat ricotta or cottage cheese, or plain low-fat yogurt. Low-sodium or reduced-sodium cheeses. °Fats and Oils °Tub margarines without trans fats. Light or reduced-fat mayonnaise and salad dressings (reduced sodium). Avocado. Safflower, olive, or canola oils. Natural peanut or almond butter. °Other °Unsalted popcorn and pretzels. °The items listed above may not be a complete list of recommended foods or beverages. Contact your dietitian for more options. °WHAT FOODS ARE NOT RECOMMENDED? °Grains °White bread. White pasta. White rice. Refined cornbread. Bagels and croissants. Crackers that contain trans fat. °Vegetables °Creamed or fried vegetables. Vegetables in a cheese sauce. Regular canned vegetables. Regular canned tomato sauce and paste. Regular tomato and vegetable juices. °Fruits °Dried fruits. Canned fruit in light or heavy syrup. Fruit juice. °Meat and Other Protein Products °Fatty cuts of meat. Ribs, chicken wings, bacon, sausage, bologna, salami, chitterlings, fatback, hot dogs, bratwurst, and packaged luncheon meats. Salted nuts and seeds. Canned beans with salt. °Dairy °Whole or 2% milk, cream, half-and-half, and cream cheese. Whole-fat or sweetened yogurt. Full-fat   cheeses or blue cheese. Nondairy creamers and whipped toppings. Processed cheese, cheese spreads, or cheese  curds. °Condiments °Onion and garlic salt, seasoned salt, table salt, and sea salt. Canned and packaged gravies. Worcestershire sauce. Tartar sauce. Barbecue sauce. Teriyaki sauce. Soy sauce, including reduced sodium. Steak sauce. Fish sauce. Oyster sauce. Cocktail sauce. Horseradish. Ketchup and mustard. Meat flavorings and tenderizers. Bouillon cubes. Hot sauce. Tabasco sauce. Marinades. Taco seasonings. Relishes. °Fats and Oils °Butter, stick margarine, lard, shortening, ghee, and bacon fat. Coconut, palm kernel, or palm oils. Regular salad dressings. °Other °Pickles and olives. Salted popcorn and pretzels. °The items listed above may not be a complete list of foods and beverages to avoid. Contact your dietitian for more information. °WHERE CAN I FIND MORE INFORMATION? °National Heart, Lung, and Blood Institute: www.nhlbi.nih.gov/health/health-topics/topics/dash/ °  °This information is not intended to replace advice given to you by your health care provider. Make sure you discuss any questions you have with your health care provider. °  °Document Released: 12/25/2010 Document Revised: 01/26/2014 Document Reviewed: 11/09/2012 °Elsevier Interactive Patient Education ©2016 Elsevier Inc. ° °- °Diabetes Mellitus and Food °It is important for you to manage your blood sugar (glucose) level. Your blood glucose level can be greatly affected by what you eat. Eating healthier foods in the appropriate amounts throughout the day at about the same time each day will help you control your blood glucose level. It can also help slow or prevent worsening of your diabetes mellitus. Healthy eating may even help you improve the level of your blood pressure and reach or maintain a healthy weight.  °General recommendations for healthful eating and cooking habits include: °· Eating meals and snacks regularly. Avoid going long periods of time without eating to lose weight. °· Eating a diet that consists mainly of plant-based foods, such  as fruits, vegetables, nuts, legumes, and whole grains. °· Using low-heat cooking methods, such as baking, instead of high-heat cooking methods, such as deep frying. °Work with your dietitian to make sure you understand how to use the Nutrition Facts information on food labels. °HOW CAN FOOD AFFECT ME? °Carbohydrates °Carbohydrates affect your blood glucose level more than any other type of food. Your dietitian will help you determine how many carbohydrates to eat at each meal and teach you how to count carbohydrates. Counting carbohydrates is important to keep your blood glucose at a healthy level, especially if you are using insulin or taking certain medicines for diabetes mellitus. °Alcohol °Alcohol can cause sudden decreases in blood glucose (hypoglycemia), especially if you use insulin or take certain medicines for diabetes mellitus. Hypoglycemia can be a life-threatening condition. Symptoms of hypoglycemia (sleepiness, dizziness, and disorientation) are similar to symptoms of having too much alcohol.  °If your health care provider has given you approval to drink alcohol, do so in moderation and use the following guidelines: °· Women should not have more than one drink per day, and men should not have more than two drinks per day. One drink is equal to: °¨ 12 oz of beer. °¨ 5 oz of wine. °¨ 1½ oz of hard liquor. °· Do not drink on an empty stomach. °· Keep yourself hydrated. Have water, diet soda, or unsweetened iced tea. °· Regular soda, juice, and other mixers might contain a lot of carbohydrates and should be counted. °WHAT FOODS ARE NOT RECOMMENDED? °As you make food choices, it is important to remember that all foods are not the same. Some foods have fewer nutrients per serving than other foods, even   though they might have the same number of calories or carbohydrates. It is difficult to get your body what it needs when you eat foods with fewer nutrients. Examples of foods that you should avoid that are  high in calories and carbohydrates but low in nutrients include: °· Trans fats (most processed foods list trans fats on the Nutrition Facts label). °· Regular soda. °· Juice. °· Candy. °· Sweets, such as cake, pie, doughnuts, and cookies. °· Fried foods. °WHAT FOODS CAN I EAT? °Eat nutrient-rich foods, which will nourish your body and keep you healthy. The food you should eat also will depend on several factors, including: °· The calories you need. °· The medicines you take. °· Your weight. °· Your blood glucose level. °· Your blood pressure level. °· Your cholesterol level. °You should eat a variety of foods, including: °· Protein. °¨ Lean cuts of meat. °¨ Proteins low in saturated fats, such as fish, egg whites, and beans. Avoid processed meats. °· Fruits and vegetables. °¨ Fruits and vegetables that may help control blood glucose levels, such as apples, mangoes, and yams. °· Dairy products. °¨ Choose fat-free or low-fat dairy products, such as milk, yogurt, and cheese. °· Grains, bread, pasta, and rice. °¨ Choose whole grain products, such as multigrain bread, whole oats, and brown rice. These foods may help control blood pressure. °· Fats. °¨ Foods containing healthful fats, such as nuts, avocado, olive oil, canola oil, and fish. °DOES EVERYONE WITH DIABETES MELLITUS HAVE THE SAME MEAL PLAN? °Because every person with diabetes mellitus is different, there is not one meal plan that works for everyone. It is very important that you meet with a dietitian who will help you create a meal plan that is just right for you. °  °This information is not intended to replace advice given to you by your health care provider. Make sure you discuss any questions you have with your health care provider. °  °Document Released: 10/02/2004 Document Revised: 01/26/2014 Document Reviewed: 12/02/2012 °Elsevier Interactive Patient Education ©2016 Elsevier Inc. ° °- °Tips for Eating Away From Home If You Have Diabetes °Controlling your  level of blood glucose, also known as blood sugar, can be challenging. It can be even more difficult when you do not prepare your own meals. The following tips can help you manage your diabetes when you eat away from home. °PLANNING AHEAD °Plan ahead if you know you will be eating away from home: °· Ask your health care provider how to time meals and medicine if you are taking insulin. °· Make a list of restaurants near you that offer healthy choices. If they have a carry-out menu, take it home and plan what you will order ahead of time. °· Look up the restaurant you want to eat at online. Many chain and fast-food restaurants list nutritional information online. Use this information to choose the healthiest options and to calculate how many carbohydrates will be in your meal. °· Use a carbohydrate-counting book or mobile app to look up the carbohydrate content and serving size of the foods you want to eat. °· Become familiar with serving sizes and learn to recognize how many servings are in a portion. This will allow you to estimate how many carbohydrates you can eat. °FREE FOODS °A "free food" is any food or drink that has less than 5 g of carbohydrates per serving. Free foods include: °· Many vegetables. °· Hard boiled eggs. °· Nuts or seeds. °· Olives. °· Cheeses. °· Meats. °These   types of foods make good appetizer choices and are often available at salad bars. Lemon juice, vinegar, or a low-calorie salad dressing of fewer than 20 calories per serving can be used as a "free" salad dressing.  °CHOICES TO REDUCE CARBOHYDRATES °· Substitute nonfat sweetened yogurt with a sugar-free yogurt. Yogurt made from soy milk may also be used, but you will still want a sugar-free or plain option to choose a lower carbohydrate amount. °· Ask your server to take away the bread basket or chips from your table. °· Order fresh fruit. A salad bar often offers fresh fruit choices. Avoid canned fruit because it is usually packed in  sugar or syrup. °· Order a salad, and eat it without dressing. Or, create a "free" salad dressing. °· Ask for substitutions. For example, instead of French fries, request an order of a vegetable such as salad, green beans, or broccoli. °OTHER TIPS  °· If you take insulin, take the insulin once your food arrives to your table. This will ensure your insulin and food are timed correctly. °· Ask your server about the portion size before your order, and ask for a take-out box if the portion has more servings than you should have. When your food comes, leave the amount you should have on the plate, and put the rest in the take-out box. °· Consider splitting an entree with someone and ordering a side salad. °  °This information is not intended to replace advice given to you by your health care provider. Make sure you discuss any questions you have with your health care provider. °  °Document Released: 01/05/2005 Document Revised: 09/26/2014 Document Reviewed: 04/04/2013 °Elsevier Interactive Patient Education ©2016 Elsevier Inc. ° ° °

## 2015-10-15 NOTE — Progress Notes (Signed)
Pt is in the office today for a 3 month follow up  

## 2015-10-15 NOTE — Progress Notes (Signed)
[name], is a 60 y.o. female  [id number]  [id number]  DOB - 1955-06-28  Chief Complaint  Patient presents with  . Follow-up    3 month        Subjective:   [name] is a 60 y.o. female here today for a follow up visit for epilepsy, depression and dm.  Pt doing well, no c/o, no recent seizures. She states a few weeks ago she got lightheaded, did not chk her cbg at time, but no presyncope/syncope.  Patient is currently seeing a therapist is helping her a lot with her anxiety and depression as well. Patient asked for refills on all her medications and she asked that her Keppra is sent to her medication assistance program ([facility]).  She gave us they're self own and fax number.    Sp neg colonoscopy 6/17, w./ medium hemorrhoids noted.  She is eating more fiber in her bowel movements are more regular. She denies any acute abdominal complaints today.  Patient has No headache, No chest pain, No abdominal pain - No Nausea, No new weakness tingling or numbness, No Cough - SOB.  No problems updated.  ALLERGIES: Allergies  Allergen Reactions  . Codeine Anaphylaxis  . Shellfish Allergy Swelling and Rash    Throat Sweling  . Banana Rash    Cannot eat any while taking KEPPRA  . Catfish [Fish Allergy] Rash  . Orange Fruit [Citrus] Rash    Cannot have while taking KEPPRA  . Tomato Itching and Rash    PAST MEDICAL HISTORY: Past Medical History:  Diagnosis Date  . Carpal tunnel syndrome   . Diabetes mellitus without complication (HCC)   . DYSTONIA 01/04/2007   Qualifier: Diagnosis of  By: Humberto SealsSaxon NP, Darl PikesSusan    . Facial droop 10/09/2013  . Hemorrhoid   . Hypertension   . Major depressive disorder, recurrent episode (HCC) 03/18/2006   Qualifier: Diagnosis of  By: Bradly BienenstockHarrelson, Kathy    . Obesity, unspecified 10/17/2007   Qualifier: Diagnosis of  By: Gerilyn PilgrimSykes PHD, Jeannie    . Prediabetes 10/09/2013  . Rectal bleeding 07/03/2015  . Seizures (HCC)   . Seizures (HCC) 08/24/2013     MEDICATIONS AT HOME: Prior to Admission medications   Medication Sig Start Date End Date Taking? Authorizing Provider  acetaminophen (TYLENOL) 500 MG tablet Take 500 mg by mouth every 6 (six) hours as needed.    Historical Provider, MD  amLODipine (NORVASC) 10 MG tablet Take 1 tablet (10 mg total) by mouth daily. 10/15/15   Pete Glatterawn T Walter Grima, MD  docusate sodium (COLACE) 100 MG capsule Take 1 capsule (100 mg total) by mouth daily as needed for mild constipation. 10/15/15   Pete Glatterawn T Bienvenido Proehl, MD  levETIRAcetam (KEPPRA XR) 500 MG 24 hr tablet Take 2 tablets (1,000 mg total) by mouth 2 (two) times daily. 10/15/15   Pete Glatterawn T Shatona Andujar, MD  lisinopril-hydrochlorothiazide (ZESTORETIC) 20-12.5 MG tablet Take 2 tablets by mouth daily. 10/15/15   Pete Glatterawn T Billi Bright, MD  loratadine (CLARITIN) 10 MG tablet  07/29/15   Historical Provider, MD  metFORMIN (GLUCOPHAGE) 500 MG tablet Take 1 tablet (500 mg total) by mouth daily with breakfast. 10/15/15   Pete Glatterawn T Fifi Schindler, MD  psyllium (METAMUCIL SMOOTH TEXTURE) 58.6 % powder Take 1 packet by mouth daily. 07/26/15   Ricarda Frameharles Woodham, MD  triamcinolone (NASACORT AQ) 55 MCG/ACT AERO nasal inhaler Place 2 sprays into the nose daily. 05/29/15   Pete Glatterawn T Atom Solivan, MD     Objective:   Vitals:  10/15/15 1527  BP: 135/84  Pulse: 92  Resp: 16  Temp: 97.9 F (36.6 C)  TempSrc: Oral  SpO2: 97%  Weight: 208 lb 6.4 oz (94.5 kg)    Exam General appearance : Awake, alert, not in any distress. Speech Clear. Not toxic looking, obese, pleasant. Good spirits HEENT: Atraumatic and Normocephalic, pupils equally reactive to light. bilat tms clear. Neck: supple, no JVD.  Chest:Good air entry bilaterally, no added sounds. CVS: S1 S2 regular, no murmurs/gallups or rubs. Abdomen: Bowel sounds active, obese, Non tender and not distended with no gaurding, rigidity or rebound. Foot exam: bilateral peripheral pulses 2+ (dorsalis pedis and post tibialis pulses), no ulcers noted/no  ecchymosis, warm to touch, monofilament testing 3/3 bilat. Sensation intact.  No c/c/e. Neurology: Awake alert, and oriented X 3, CN II-XII grossly intact, Non focal Skin:No Rash  Data Review Lab Results  Component Value Date   HGBA1C 6.1 05/29/2015   HGBA1C 6.0 02/20/2015   HGBA1C 5.80 11/20/2014    Depression screen PHQ 2/9 05/29/2015 02/20/2015  Decreased Interest 3 0  Down, Depressed, Hopeless 3 3  PHQ - 2 Score 6 3  Altered sleeping 3 0  Tired, decreased energy 3 3  Change in appetite 3 0  Feeling bad or failure about yourself  3 3  Trouble concentrating 3 3  Moving slowly or fidgety/restless 3 3  Suicidal thoughts 3 0  PHQ-9 Score 27 15  Difficult doing work/chores Extremely dIfficult -      Assessment & Plan   1. Type 2 diabetes mellitus without complication, without long-term current use of insulin (HCC) - overall doing better, will reduce metformin to daily dosing to prevent hypoglycemia. - Glucose (CBG) - HgB A1c 6.1 - metFORMIN (GLUCOPHAGE) 500 MG tablet; Take 1 tablet (500 mg total) by mouth daily with breakfast.  Dispense: 90 tablet; Refill: 3 -due for eye exam 12/17, pt knows.  2. Essential hypertension, benign Controlled, continue same, low salt/DASH diet discussed - amLODipine (NORVASC) 10 MG tablet; Take 1 tablet (10 mg total) by mouth daily.  Dispense: 90 tablet; Refill: 3 - lisinopril-hydrochlorothiazide (ZESTORETIC) 20-12.5 MG tablet; Take 2 tablets by mouth daily.  Dispense: 180 tablet; Refill: 3  3. Encounter for immunization - pneumococcal 23v today. - Flu Vaccine QUAD 36+ mos IM  4. Hx of epilepsy - does not sound like recent seizure - renewed same keppra. - per pt has fu w/ her Neurologist is few months, but she gets her rx through our clinic - asked [place] to fax rx to [facility] rx assistance program, gave info to my CMA to assist w/ fax   Patient have been counseled extensively about nutrition and exercise  Return in about 3 months (around  01/14/2016).  The patient was given clear instructions to go to ER or return to medical center if symptoms don't improve, worsen or new problems develop. The patient verbalized understanding. The patient was told to call to get lab results if they haven't heard anything in the next week.   This note has been created with Education officer, environmental. Any transcriptional errors are unintentional.   Pete Glatter, MD, MBA/MHA [facility] and [facility] [place], [place] [phone]   10/15/2015, 5:10 PM

## 2015-10-16 ENCOUNTER — Ambulatory Visit: Payer: Self-pay

## 2015-10-17 ENCOUNTER — Telehealth: Payer: Self-pay | Admitting: Internal Medicine

## 2015-10-17 NOTE — Telephone Encounter (Signed)
[name] calling requesting a completed [facility] application for the patient in regards to the pts Keppra prescription

## 2015-10-18 ENCOUNTER — Telehealth: Payer: Self-pay

## 2015-10-18 NOTE — Telephone Encounter (Signed)
Contacted pt to see if she would like us to fill the paperwork out for [facility] or would she want to come pick it up after were done filling out our part. Pt didn't answer lvm for pt to give me a call back at her earliest convenience

## 2015-10-18 NOTE — Telephone Encounter (Signed)
Contacted [facility] and had the for emailed to me. Will start filling some of it out and will give to Dr. Julien NordmannLangeland on Monday

## 2015-10-21 NOTE — Telephone Encounter (Signed)
Patient returned the nurse phone call °

## 2015-10-24 ENCOUNTER — Other Ambulatory Visit: Payer: Self-pay

## 2015-10-24 MED ORDER — LEVETIRACETAM ER 500 MG PO TB24: 1000.0000 mg | ORAL_TABLET | Freq: Two times a day (BID) | ORAL | 3 refills | Status: DC

## 2015-10-24 MED ORDER — LEVETIRACETAM ER 500 MG PO TB24: 1000.0000 mg | ORAL_TABLET | Freq: Two times a day (BID) | ORAL | 0 refills | Status: DC

## 2015-10-24 MED FILL — !KEPPRA XR 500 MG TAB: 500 MG | 15 days supply | Qty: 60 | Fill #0

## 2015-11-05 ENCOUNTER — Ambulatory Visit (HOSPITAL_COMMUNITY): Payer: Self-pay | Admitting: Psychiatry

## 2015-11-18 MED FILL — metFORMIN HCL 500 MG TABS: 500 | 30 days supply | Qty: 60 | Fill #3

## 2015-11-18 MED FILL — LORATADINE 10 MG TABLET: 10 | 30 days supply | Qty: 30 | Fill #4

## 2015-11-18 MED FILL — AMLODIPINE BESYLATE 10 MG T: 10 | 30 days supply | Qty: 30 | Fill #1

## 2015-11-18 MED FILL — LISINOPRIL-HCTZ 20-12.5 MG: 20-12.5 | 30 days supply | Qty: 60 | Fill #9
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
## 2015-12-04 ENCOUNTER — Encounter (HOSPITAL_COMMUNITY): Payer: Self-pay | Admitting: Psychiatry

## 2015-12-04 ENCOUNTER — Ambulatory Visit (INDEPENDENT_AMBULATORY_CARE_PROVIDER_SITE_OTHER): Payer: No Typology Code available for payment source | Admitting: Psychiatry

## 2015-12-04 VITALS — BP 122/74 | HR 79 | Ht 62.0 in | Wt 207.8 lb

## 2015-12-04 DIAGNOSIS — F4323 Adjustment disorder with mixed anxiety and depressed mood: Secondary | ICD-10-CM | POA: Insufficient documentation

## 2015-12-04 DIAGNOSIS — Z91013 Allergy to seafood: Secondary | ICD-10-CM

## 2015-12-04 DIAGNOSIS — Z800 Family history of malignant neoplasm of digestive organs: Secondary | ICD-10-CM

## 2015-12-04 DIAGNOSIS — Z808 Family history of malignant neoplasm of other organs or systems: Secondary | ICD-10-CM

## 2015-12-04 DIAGNOSIS — Z888 Allergy status to other drugs, medicaments and biological substances status: Secondary | ICD-10-CM

## 2015-12-04 DIAGNOSIS — Z825 Family history of asthma and other chronic lower respiratory diseases: Secondary | ICD-10-CM

## 2015-12-04 DIAGNOSIS — Z823 Family history of stroke: Secondary | ICD-10-CM

## 2015-12-04 DIAGNOSIS — Z79899 Other long term (current) drug therapy: Secondary | ICD-10-CM

## 2015-12-04 DIAGNOSIS — Z91018 Allergy to other foods: Secondary | ICD-10-CM

## 2015-12-04 DIAGNOSIS — Z8249 Family history of ischemic heart disease and other diseases of the circulatory system: Secondary | ICD-10-CM

## 2015-12-04 DIAGNOSIS — Z833 Family history of diabetes mellitus: Secondary | ICD-10-CM

## 2015-12-04 DIAGNOSIS — F5101 Primary insomnia: Secondary | ICD-10-CM

## 2015-12-04 MED ORDER — MIRTAZAPINE 7.5 MG PO TABS: ORAL_TABLET | ORAL | 0 refills | Status: DC

## 2015-12-04 MED FILL — MIRTAZAPINE 7.5 MG TABLET: 7.5 | 30 days supply | Qty: 60 | Fill #0

## 2015-12-04 NOTE — Patient Instructions (Signed)
1. Start mirtazapine 7.5 mg at night for two weeks, then 15 mg at night 2. Return to clinic in one month

## 2015-12-18 MED FILL — LISINOPRIL-HCTZ 20-12.5 MG: 20-12.5 | 30 days supply | Qty: 60 | Fill #0

## 2015-12-20 ENCOUNTER — Ambulatory Visit (INDEPENDENT_AMBULATORY_CARE_PROVIDER_SITE_OTHER): Payer: Self-pay | Admitting: Gastroenterology

## 2015-12-20 ENCOUNTER — Encounter: Payer: Self-pay | Admitting: Gastroenterology

## 2015-12-20 VITALS — BP 150/89 | HR 77 | Temp 97.8°F | Ht 62.0 in | Wt 203.4 lb

## 2015-12-20 DIAGNOSIS — K625 Hemorrhage of anus and rectum: Secondary | ICD-10-CM

## 2015-12-20 NOTE — Assessment & Plan Note (Signed)
Doing well without any further rectal bleeding. Colonoscopy on file with internal hemorrhoids. Saw [facility] and states she was started on stool softeners and Metamucil, which she has since run out of. BMs daily, soft, productive. Add back fiber daily, return in 6 months.

## 2015-12-20 NOTE — Patient Instructions (Signed)
I am so glad you are doing well!  We will see you in 6 months!   

## 2015-12-20 NOTE — Progress Notes (Signed)
CC'ED TO PCP 

## 2015-12-20 NOTE — Progress Notes (Signed)
Referring Provider: Pete GlatterLangeland, Dawn T, MD Primary Care Physician:  Pete Glatterawn T Langeland, MD Primary GI: Dr. Darrick PennaFields   Chief Complaint  Patient presents with  . Rectal Bleeding    HPI:   Ronnald RampRenee Lancour is a 60 y.o. female presenting today with a history of rectal bleeding, colonoscopy this summer with small-mouth diverticula, small non-bleeding internal hemorrhoids, and redundant colon. [facility].   Stool used to be small, round. Started on a little [facility] colace BID). Ran out of colace and no longer taking. Not taking Metamucil anymore. Has a BM every day that is soft. No further rectal bleeding. Disability has been approved, and she is excited. Wants to get a smart car.   Past Medical History:  Diagnosis Date  . Carpal tunnel syndrome   . Diabetes mellitus without complication (HCC)   . DYSTONIA 01/04/2007   Qualifier: Diagnosis of  By: Humberto SealsSaxon NP, Darl PikesSusan    . Facial droop 10/09/2013  . Hemorrhoid   . Hypertension   . Major depressive disorder, recurrent episode (HCC) 03/18/2006   Qualifier: Diagnosis of  By: Bradly BienenstockHarrelson, Kathy    . Obesity, unspecified 10/17/2007   Qualifier: Diagnosis of  By: Gerilyn PilgrimSykes PHD, Jeannie    . Prediabetes 10/09/2013  . Rectal bleeding 07/03/2015  . Seizures (HCC)   . Seizures (HCC) 08/24/2013    Past Surgical History:  Procedure Laterality Date  . ABDOMINAL HYSTERECTOMY    . COLONOSCOPY N/A 07/15/2015   Dr. Darrick PennaFields: small-mouth diverticula, small non-bleeding internal hemorrhoids, redundant colon   . HERNIA REPAIR    . MYOMECTOMY      Current Outpatient Prescriptions  Medication Sig Dispense Refill  . acetaminophen (TYLENOL) 500 MG tablet Take 500 mg by mouth every 6 (six) hours as needed.    [name]. amLODipine (NORVASC) 10 MG tablet Take 1 tablet (10 mg total) by mouth daily. 90 tablet 3  . levETIRAcetam (KEPPRA XR) 500 MG 24 hr tablet Take 2 tablets (1,000 mg total) by mouth 2  (two) times daily. 360 tablet 3  . lisinopril-hydrochlorothiazide (ZESTORETIC) 20-12.5 MG tablet Take 2 tablets by mouth daily. 180 tablet 3  . loratadine (CLARITIN) 10 MG tablet   11  . metFORMIN (GLUCOPHAGE) 500 MG tablet Take 1 tablet (500 mg total) by mouth daily with breakfast. 90 tablet 3  . mirtazapine (REMERON) 7.5 MG tablet Take 7.5 mg at night for two weeks, then 15 mg at night 60 tablet 0  . triamcinolone (NASACORT AQ) 55 MCG/ACT AERO nasal inhaler Place 2 sprays into the nose daily. 1 Inhaler 12   No current facility-administered medications for this visit.     Allergies as of 12/20/2015 - Review Complete 12/20/2015  Allergen Reaction Noted  . Codeine Anaphylaxis 08/24/2013  . Shellfish allergy Swelling and Rash 10/05/2013  . Banana Rash 10/05/2013  . Catfish [fish allergy] Rash 06/13/2014  . Orange fruit [citrus] Rash 10/05/2013  . Tomato Itching and Rash 08/24/2013    Family History  Problem Relation Age of Onset  . Asthma Mother   . Hypertension Father   . Heart disease Father   . Stroke Father   . Cancer Sister     uterus cancer   . Diabetes Maternal Uncle   . Colon cancer Neg Hx     Social History   Social History  . Marital status: Divorced    Spouse name: N/A  . Number of children: 1  .  Years of education: N/A   Social History Main Topics  . Smoking status: Never Smoker  . Smokeless tobacco: Never Used  . Alcohol use No  . Drug use: No  . Sexual activity: Not Asked   Other Topics Concern  . None   Social History Narrative  . None    Review of Systems: As mentioned in HPI   Physical Exam: BP (!) 150/89   Pulse 77   Temp 97.8 F (36.6 C) (Oral)   Ht 5\' 2"  (1.575 m)   Wt 203 lb 6.4 oz (92.3 kg)   BMI 37.20 kg/m  General:   Alert and oriented. No distress noted. Pleasant and cooperative.  Head:  Normocephalic and atraumatic. Eyes:  Conjuctiva clear without scleral icterus. Abdomen:  +BS, soft, mildly tender at site of incisional scar  and non-distended. No rebound or guarding. No HSM or masses noted. Msk:  Symmetrical without gross deformities. Normal posture. Extremities:  Without edema. Neurologic:  Alert and  oriented x4 Psych:  Alert and cooperative. Normal mood and affect.

## 2015-12-31 ENCOUNTER — Other Ambulatory Visit: Payer: Self-pay

## 2015-12-31 MED ORDER — LEVETIRACETAM ER 500 MG PO TB24: 1000.0000 mg | ORAL_TABLET | Freq: Two times a day (BID) | ORAL | 3 refills | Status: DC
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
## 2016-01-06 ENCOUNTER — Ambulatory Visit (INDEPENDENT_AMBULATORY_CARE_PROVIDER_SITE_OTHER): Payer: No Typology Code available for payment source | Admitting: Psychiatry

## 2016-01-06 ENCOUNTER — Encounter (HOSPITAL_COMMUNITY): Payer: Self-pay | Admitting: Psychiatry

## 2016-01-06 VITALS — BP 122/68 | HR 73 | Ht 62.0 in | Wt 207.4 lb

## 2016-01-06 DIAGNOSIS — Z91013 Allergy to seafood: Secondary | ICD-10-CM

## 2016-01-06 DIAGNOSIS — Z79899 Other long term (current) drug therapy: Secondary | ICD-10-CM

## 2016-01-06 DIAGNOSIS — Z808 Family history of malignant neoplasm of other organs or systems: Secondary | ICD-10-CM

## 2016-01-06 DIAGNOSIS — Z91018 Allergy to other foods: Secondary | ICD-10-CM

## 2016-01-06 DIAGNOSIS — Z825 Family history of asthma and other chronic lower respiratory diseases: Secondary | ICD-10-CM

## 2016-01-06 DIAGNOSIS — Z833 Family history of diabetes mellitus: Secondary | ICD-10-CM

## 2016-01-06 DIAGNOSIS — Z823 Family history of stroke: Secondary | ICD-10-CM

## 2016-01-06 DIAGNOSIS — Z888 Allergy status to other drugs, medicaments and biological substances status: Secondary | ICD-10-CM

## 2016-01-06 DIAGNOSIS — Z9889 Other specified postprocedural states: Secondary | ICD-10-CM

## 2016-01-06 DIAGNOSIS — F4323 Adjustment disorder with mixed anxiety and depressed mood: Secondary | ICD-10-CM

## 2016-01-06 DIAGNOSIS — Z8249 Family history of ischemic heart disease and other diseases of the circulatory system: Secondary | ICD-10-CM

## 2016-01-06 MED ORDER — MIRTAZAPINE 15 MG PO TABS: 15.0000 mg | ORAL_TABLET | Freq: Every day | ORAL | 1 refills | Status: DC

## 2016-01-06 MED FILL — MIRTAZAPINE 15 MG TABLET: 15 | 30 days supply | Qty: 30 | Fill #0

## 2016-01-06 NOTE — Patient Instructions (Addendum)
1. Continue mirtazapine 15 mg at night  2. Return to clinic in February

## 2016-01-07 ENCOUNTER — Ambulatory Visit: Payer: Self-pay | Attending: Internal Medicine

## 2016-01-09 ENCOUNTER — Ambulatory Visit: Payer: Medicaid Other | Attending: Internal Medicine | Admitting: Physician Assistant

## 2016-01-09 VITALS — BP 138/87 | HR 83 | Temp 97.1°F | Resp 20 | Ht 62.0 in | Wt 209.0 lb

## 2016-01-09 DIAGNOSIS — E669 Obesity, unspecified: Secondary | ICD-10-CM | POA: Diagnosis not present

## 2016-01-09 DIAGNOSIS — J0120 Acute ethmoidal sinusitis, unspecified: Secondary | ICD-10-CM | POA: Diagnosis not present

## 2016-01-09 DIAGNOSIS — Z885 Allergy status to narcotic agent status: Secondary | ICD-10-CM | POA: Diagnosis not present

## 2016-01-09 DIAGNOSIS — Z91013 Allergy to seafood: Secondary | ICD-10-CM | POA: Insufficient documentation

## 2016-01-09 DIAGNOSIS — Z7984 Long term (current) use of oral hypoglycemic drugs: Secondary | ICD-10-CM | POA: Diagnosis not present

## 2016-01-09 DIAGNOSIS — Z6838 Body mass index (BMI) 38.0-38.9, adult: Secondary | ICD-10-CM | POA: Diagnosis not present

## 2016-01-09 DIAGNOSIS — I10 Essential (primary) hypertension: Secondary | ICD-10-CM | POA: Diagnosis not present

## 2016-01-09 DIAGNOSIS — E118 Type 2 diabetes mellitus with unspecified complications: Secondary | ICD-10-CM | POA: Insufficient documentation

## 2016-01-09 DIAGNOSIS — Z79899 Other long term (current) drug therapy: Secondary | ICD-10-CM | POA: Diagnosis not present

## 2016-01-09 DIAGNOSIS — J329 Chronic sinusitis, unspecified: Secondary | ICD-10-CM | POA: Diagnosis present

## 2016-01-09 DIAGNOSIS — R569 Unspecified convulsions: Secondary | ICD-10-CM | POA: Diagnosis not present

## 2016-01-09 MED ORDER — FLUTICASONE PROPIONATE 50 MCG/ACT NA SUSP: 2.0000 | Freq: Every day | NASAL | 6 refills | Status: DC

## 2016-01-09 MED ORDER — AMOXICILLIN 500 MG PO CAPS: 500.0000 mg | ORAL_CAPSULE | Freq: Three times a day (TID) | ORAL | 0 refills | Status: DC

## 2016-01-09 MED FILL — FLUTICASONE PROP 50 MCG SPR: 50 | 30 days supply | Qty: 16 | Fill #0

## 2016-01-09 MED FILL — AMOXICILLIN 500 MG CAPSULE: 500 | 10 days supply | Qty: 30 | Fill #0

## 2016-01-09 NOTE — Progress Notes (Signed)
Kirsten RampRenee Due, is a 60 y.o. female  ZOX:[id number]:[id number]  [id number]:[id number]  DOB - [birth year]  Subjective:  Chief Complaint and HPI: [name] is a 60 y.o. female here today for concern for sinusitis.  She is about to have some dental work done and the dentisit wanted to make sure she doesn't have sinusitis.  The dental work is scheduled for next month.  She c/o pain in her teeth and maxillary sinus area when she eats/bites.  In addition, she c/o B ear pain. This has been going on for several weeks.  She feels pain and pressure is her sinuses and always feels congested.  She has blown some green mucus from her nose.  She denies f/c.    [facility].  She is stable on her meds.  Denies SI/HI.   ROS:   Constitutional:  No f/c, No night sweats, No unexplained weight loss. EENT:  No vision changes, No blurry vision, No hearing changes. +sinus pain and pressure, + ear congestion and pain  Respiratory: No cough, No SOB Cardiac: No CP, no palpitations GI:  No abd pain, No N/V/D. GU: No Urinary s/sx Musculoskeletal: No joint pain Neuro: No headache, no dizziness, no motor weakness.  Skin: No rash Endocrine:  No polydipsia. No polyuria.  Psych: Denies SI/HI  No problems updated.  ALLERGIES: Allergies  Allergen Reactions  . Codeine Anaphylaxis  . Shellfish Allergy Swelling and Rash    Throat Sweling  . Banana Rash    Cannot eat any while taking KEPPRA  . Catfish [Fish Allergy] Rash  . Orange Fruit [Citrus] Rash    Cannot have while taking KEPPRA  . Tomato Itching and Rash    PAST MEDICAL HISTORY: Past Medical History:  Diagnosis Date  . Carpal tunnel syndrome   . Diabetes mellitus without complication (HCC)   . DYSTONIA 01/04/2007   Qualifier: Diagnosis of  By: Humberto SealsSaxon NP, Darl PikesSusan    . Facial droop 10/09/2013  . Hemorrhoid   . Hypertension   . Major depressive disorder, recurrent episode (HCC) 03/18/2006   Qualifier: Diagnosis of  By: Bradly BienenstockHarrelson, Kathy    .  Obesity, unspecified 10/17/2007   Qualifier: Diagnosis of  By: Gerilyn PilgrimSykes PHD, Jeannie    . Prediabetes 10/09/2013  . Rectal bleeding 07/03/2015  . Seizures (HCC)   . Seizures (HCC) 08/24/2013    MEDICATIONS AT HOME: Prior to Admission medications   Medication Sig Start Date End Date Taking? Authorizing Provider  acetaminophen (TYLENOL) 500 MG tablet Take 500 mg by mouth every 6 (six) hours as needed.   Yes Historical Provider, MD  amLODipine (NORVASC) 10 MG tablet Take 1 tablet (10 mg total) by mouth daily. 10/15/15  Yes Pete Glatterawn T Langeland, MD  levETIRAcetam (KEPPRA XR) 500 MG 24 hr tablet Take 2 tablets (1,000 mg total) by mouth 2 (two) times daily. 12/31/15  Yes Pete Glatterawn T Langeland, MD  lisinopril-hydrochlorothiazide (ZESTORETIC) 20-12.5 MG tablet Take 2 tablets by mouth daily. 10/15/15  Yes Pete Glatterawn T Langeland, MD  loratadine (CLARITIN) 10 MG tablet  07/29/15  Yes Historical Provider, MD  metFORMIN (GLUCOPHAGE) 500 MG tablet Take 1 tablet (500 mg total) by mouth daily with breakfast. 10/15/15  Yes Pete Glatterawn T Langeland, MD  mirtazapine (REMERON) 15 MG tablet Take 1 tablet (15 mg total) by mouth at bedtime. 01/06/16  Yes Neysa Hottereina Hisada, MD  amoxicillin (AMOXIL) 500 MG capsule Take 1 capsule (500 mg total) by mouth 3 (three) times daily. 01/09/16   Anders SimmondsAngela M McClung, PA-C  fluticasone (  FLONASE) 50 MCG/ACT nasal spray Place 2 sprays into both nostrils daily. 01/09/16   Anders SimmondsAngela M McClung, PA-C     Objective:  EXAM:   Vitals:   01/09/16 1029  BP: 138/87  Pulse: 83  Resp: 20  Temp: 97.1 F (36.2 C)  TempSrc: Oral  SpO2: 99%  Weight: 209 lb (94.8 kg)  Height: 5\' 2"  (1.575 m)    General appearance : A&OX3. NAD. Non-toxic-appearing HEENT: Atraumatic and Normocephalic.  PERRLA. EOM intact.  TM full B without infection. Mouth-MMM, post pharynx WNL w/o erythema, No PND.  Poor dentition.  No abscess.  She is TTP over the maxillary sinuses.  Her turbinates are enlarged. Neck: supple, no JVD. No cervical  lymphadenopathy. No thyromegaly Chest/Lungs:  Breathing-non-labored, Good air entry bilaterally, breath sounds normal without rales, rhonchi, or wheezing  CVS: S1 S2 regular, no murmurs, gallops, rubs  Extremities: Bilateral Lower Ext shows no edema, both legs are warm to touch with = pulse throughout Neurology:  CN II-XII grossly intact aside from facial droop, Non focal.   Psych:  Soft speech. Appropriate eye contact.  Skin:  No Rash  Data Review Lab Results  Component Value Date   HGBA1C 5.8 10/15/2015   HGBA1C 6.1 05/29/2015   HGBA1C 6.0 02/20/2015     Assessment & Plan   1. Acute ethmoidal sinusitis, recurrence not specified Suspicious for bacterial sinusitis. Will cover so dental work can be done - amoxicillin (AMOXIL) 500 MG capsule; Take 1 capsule (500 mg total) by mouth 3 (three) times daily.  Dispense: 30 capsule; Refill: 0 - fluticasone (FLONASE) 50 MCG/ACT nasal spray; Place 2 sprays into both nostrils daily.  Dispense: 16 g; Refill: 6  2. Type 2 diabetes mellitus with complication, without long-term current use of insulin (HCC) Controlled.  Continue current regimen - POCT glucose (manual entry)     Patient have been counseled extensively about nutrition and exercise  Return in about 2 months (around 03/11/2016) for Dr Julien NordmannLangeland; f/up DM and htn.  The patient was given clear instructions to go to ER or return to medical center if symptoms don't improve, worsen or new problems develop. The patient verbalized understanding. The patient was told to call to get lab results if they haven't heard anything in the next week.     Georgian CoAngela McClung, PA-C [facility] [place], [place] [phone]   01/09/2016, 11:03 AM

## 2016-01-09 NOTE — Progress Notes (Signed)
Status post dentist visit December 7th advised will need some work done.  But having mouth pain x months.  Has Appointmentt with dentist Feb 05, 2016. CBG: 80

## 2016-01-10 LAB — GLUCOSE, POCT (MANUAL RESULT ENTRY): POC GLUCOSE: 80 mg/dL (ref 70–99)

## 2016-01-11 NOTE — Progress Notes (Signed)
REVIEWED-NO ADDITIONAL RECOMMENDATIONS. 

## 2016-01-21 MED FILL — ?AMLODIPINE BESYLATE 10 MG: 10 | 30 days supply | Qty: 30 | Fill #2

## 2016-01-21 MED FILL — LISINOPRIL-HCTZ 20-12.5 MG: 20-12.5 | 30 days supply | Qty: 60 | Fill #1

## 2016-02-04 MED FILL — metFORMIN HCL 500 MG TABS: 500 | 30 days supply | Qty: 60 | Fill #4

## 2016-02-04 MED FILL — MIRTAZAPINE 15 MG TABLET: 15 | 30 days supply | Qty: 30 | Fill #1

## 2016-02-04 MED FILL — FLUTICASONE PROP 50 MCG SPR: 50 | 30 days supply | Qty: 16 | Fill #1

## 2016-02-04 MED FILL — LORATADINE 10 MG TABLET: 10 | 30 days supply | Qty: 30 | Fill #5

## 2016-02-11 ENCOUNTER — Encounter: Payer: Self-pay | Admitting: Neurology

## 2016-02-11 ENCOUNTER — Ambulatory Visit (INDEPENDENT_AMBULATORY_CARE_PROVIDER_SITE_OTHER): Payer: Self-pay | Admitting: Neurology

## 2016-02-11 VITALS — BP 144/72 | HR 87 | Ht 62.0 in | Wt 206.3 lb

## 2016-02-11 DIAGNOSIS — R569 Unspecified convulsions: Secondary | ICD-10-CM

## 2016-02-11 NOTE — Patient Instructions (Signed)
1. Continue Keppra XR 1000mg  twice a day 2. Follow-up in 1 year, call for any changes  Seizure Precautions: 1. If medication has been prescribed for you to prevent seizures, take it exactly as directed.  Do not stop taking the medicine without talking to your doctor first, even if you have not had a seizure in a long time.   2. Avoid activities in which a seizure would cause danger to yourself or to others.  Don't operate dangerous machinery, swim alone, or climb in high or dangerous places, such as on ladders, roofs, or girders.  Do not drive unless your doctor says you may.  3. If you have any warning that you may have a seizure, lay down in a safe place where you can't hurt yourself.    4.  No driving for 6 months from last seizure, [place].   Please refer to the following link on the Epilepsy Foundation of America's website for more information: http://www.epilepsyfoundation.org/answerplace/Social/driving/drivingu.cfm   5.  Maintain good sleep hygiene. Avoid alcohol.  6.  Contact your doctor if you have any problems that may be related to the medicine you are taking.  7.  Call 911 and bring the patient back to the ED if:        A.  The seizure lasts longer than 5 minutes.       B.  The patient doesn't awaken shortly after the seizure  C.  The patient has new problems such as difficulty seeing, speaking or moving  D.  The patient was injured during the seizure  E.  The patient has a temperature over 102 F (39C)  F.  The patient vomited and now is having trouble breathing

## 2016-02-11 NOTE — Progress Notes (Signed)
NEUROLOGY FOLLOW UP OFFICE NOTE  Kirsten RampRenee Roggenkamp [id number]  HISTORY OF PRESENT ILLNESS: I had the pleasure of seeing [name] in follow-up in the neurology clinic on 02/11/2016.  The patient was last seen 6 months ago for a diagnosis of seizures. She was having recurrent episodes of loss of consciousness with report of right-sided symptoms initially, but recently symptoms are preceded by pain and discomfort in her extremities (LE>UE). 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 She was started on Keppra 1000mg  BID on [facility]. She had been doing well with no spells for a year, until Dec 2017 when she had a "light one," she was alone at home and fell on the carpet. She states she was aware that she had to be in a fetal position while she was shaking. She did not injure herself. This occurred during the holidays when she was stressed out and emotional, having chest pains daily. She had chest pain yesterday while in the [employer] office, being in a crowded place makes her nervous. She states it takes a toll on her to go to doctor appointments. She continues to see her therapist and has been diagnosed with anxiety and depression. She has a headache this morning, reporting that headaches usually go away when she eats. She has occasional numbness and tingling in her feet. She denies any vision changes, olfactory/gustatory hallucinations, myoclonic jerks.   HPI: This is a pleasant 61 yo RH woman with a history of hypertension and a diagnosis of seizures. She reports that neurological symptoms started in 2005 when she would have involuntary movements of the right leg. She states she had lost her grandmother and this had an effect on her, and had symptoms since then. She had seen neurologist Dr. Aletha HalimAndreas Runheim at that time and was diagnosed with focal limb dystomia, told that this may be caused by a  chemical imbalance. She was treated with Botox but did not tolerate it. She reports seeing Dr. Estella Huskunheim from 2006 to 2009, but also at the same time she started having blackouts. She reports that she had a blackout in her doctor's office one time, but instead of [facility], [facility]. She has had blackouts so bad that neighbors would tell her she needed to go to the ER. She usually starts feeling pain in both legs, followed by right-sided shaking, numbness on the left side of her face, throat gets tight, then she falls to the floor. She lives by herself and has woken up on the floor. She reports that she can see herself shaking and would go into a fetal position to "ride it out."   In August 2015, she was brought to the ER due to recent increase in frequency of spells. She reported bilateral numbness and tingling as well as a feeling of discomfort in both upper and lower extremities. She has been told she has convulsions involving her legs. She reported urinary incontinence and tongue soreness. Her BP was very high at that time. 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 No definite gliosis of heterotopic gray matter. Her routine EEG was normal. She was discharged home on Keppra 1000mg  BID which she has been tolerating without side effects. She reports doing  well with no similar symptoms until the October 2016. She brings a calendar of her events, She had one last 10/30/14. She reports she had been stressed because her daughter told her that day that she is moving to [place] to what her mother thinks is an unhealthy relationship. She had another episode on 10/30 while walking. On 11/25/14 she had 2 episodes, one lasting 12 minutes, another one lasting an hour. She reports  being in the shower, she grabbed the handle and sat in the tub rocking in a fetal position, which she found helps. She believes stress is causing the recent seizures and talking to a therapist does help her. She states she does not know how to relax. 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 There is no history of febrile convulsions, CNS infections such as meningitis/encephalitis, significant traumatic brain injury, neurosurgical procedures, or family history of seizures.  PAST MEDICAL HISTORY: Past Medical History:  Diagnosis Date  . Carpal tunnel syndrome   . Diabetes mellitus without complication (HCC)   . DYSTONIA 01/04/2007   Qualifier: Diagnosis of  By: Humberto Seals NP, Darl Pikes    . Facial droop 10/09/2013  . Hemorrhoid   . Hypertension   . Major depressive disorder, recurrent episode (HCC) 03/18/2006   Qualifier: Diagnosis of  By: Bradly Bienenstock    . Obesity, unspecified 10/17/2007   Qualifier: Diagnosis of  By: Gerilyn Pilgrim PHD, Jeannie    . Prediabetes 10/09/2013  . Rectal bleeding 07/03/2015  . Seizures (HCC)   . Seizures (HCC) 08/24/2013    MEDICATIONS:  Outpatient Encounter Prescriptions as of 02/11/2016  Medication Sig Note  . acetaminophen (TYLENOL) 500 MG tablet Take 500 mg by mouth every 6 (six) hours as needed.   [name] amLODipine (NORVASC) 10 MG tablet Take 1 tablet (10 mg total) by mouth daily.   [name] amoxicillin (AMOXIL) 500 MG capsule Take 1 capsule (500 mg total) by mouth 3 (three) times daily.   . fluticasone (FLONASE) 50 MCG/ACT nasal spray Place 2 sprays into both nostrils daily.   [name] levETIRAcetam (KEPPRA XR) 500 MG 24 hr tablet Take 2 tablets (1,000 mg total) by mouth 2 (two) times daily.   [name] lisinopril-hydrochlorothiazide (ZESTORETIC) 20-12.5 MG tablet Take 2 tablets by mouth daily.   [name] loratadine (CLARITIN) 10 MG tablet  08/09/2015: Received from: External Pharmacy  . metFORMIN (GLUCOPHAGE) 500 MG tablet Take 1 tablet (500 mg total) by mouth  daily with breakfast.   . mirtazapine (REMERON) 15 MG tablet Take 1 tablet (15 mg total) by mouth at bedtime.    No facility-administered encounter medications on file as of 02/11/2016.     ALLERGIES: Allergies  Allergen Reactions  . Codeine Anaphylaxis  . Shellfish Allergy Swelling and Rash    Throat Sweling  . Banana Rash    Cannot eat any while taking KEPPRA  . Catfish [Fish Allergy] Rash  . Orange Fruit [Citrus] Rash    Cannot have while taking KEPPRA  . Tomato Itching and Rash    FAMILY HISTORY: Family History  Problem Relation Age of Onset  . Asthma Mother   . Hypertension Father   . Heart disease Father   . Stroke Father   . Cancer Sister     uterus cancer   . Diabetes Maternal Uncle   . Colon cancer Neg Hx     SOCIAL HISTORY: Social History   Social History  . Marital status: Divorced    Spouse name: N/A  .  Number of children: 1  . Years of education: N/A   Occupational History  . Not on file.   Social History Main Topics  . Smoking status: Never Smoker  . Smokeless tobacco: Never Used  . Alcohol use No  . Drug use: No  . Sexual activity: Not on file   Other Topics Concern  . Not on file   Social History Narrative  . No narrative on file    REVIEW OF SYSTEMS: Constitutional: No fevers, chills, or sweats, no generalized fatigue, change in appetite Eyes: No visual changes, double vision, eye pain Ear, nose and throat: No hearing loss, ear pain, nasal congestion, sore throat Cardiovascular: No chest pain, palpitations Respiratory:  No shortness of breath at rest or with exertion, wheezes GastrointestinaI: No nausea, vomiting, diarrhea, abdominal pain, fecal incontinence Genitourinary:  No dysuria, urinary retention or frequency Musculoskeletal:  No neck pain, back pain Integumentary: No rash, pruritus, skin lesions Neurological: as above Psychiatric: + depression, insomnia, anxiety Endocrine: No palpitations, fatigue, diaphoresis, mood  swings, change in appetite, change in weight, increased thirst Hematologic/Lymphatic:  No anemia, purpura, petechiae. Allergic/Immunologic: no itchy/runny eyes, nasal congestion, recent allergic reactions, rashes  PHYSICAL EXAM: Vitals:   02/11/16 0840  BP: (!) 144/72  Pulse: 87   General: No acute distress Head:  Normocephalic/atraumatic Neck: supple, no paraspinal tenderness, full range of motion Heart:  Regular rate and rhythm Lungs:  Clear to auscultation bilaterally Back: No paraspinal tenderness Skin/Extremities: No rash, no edema Neurological Exam: alert and oriented to person, place, and time. No aphasia or dysarthria. Fund of knowledge is appropriate.  Recent and remote memory are intact.  Attention and concentration are normal.    Able to name objects and repeat phrases. Cranial nerves: Pupils equal, round, reactive to light.  Extraocular movements intact with no nystagmus, right esotropia. Visual fields full. Facial sensation intact. No facial asymmetry. Tongue, uvula, palate midline.  Motor: Bulk and tone normal, muscle strength 5/5 throughout with no pronator drift.  Sensation to light touch intact.  No extinction to double simultaneous stimulation.  Deep tendon reflexes 2+ throughout, toes downgoing.  Finger to nose testing intact.  Gait narrow-based and steady, able to tandem walk adequately.  Romberg negative.  IMPRESSION: This is a pleasant 61 yo RH woman with a history of hypertension and recurrent episodes of loss of consciousness with report of right-sided symptoms initially, but recently symptoms are preceded by pain and discomfort in her extremities (LE>UE). 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 She goes event-free for a year since starting Keppra, she had one event in October/November 2016 when her daughter told her she is moving to [place] (again raising concern for PNES), and recently one last Christmas season when she was feeling very emotional missing her family. We had discussed continuing same dose of Keppra XR 1000mg  BID. If seizures increase in frequency, she will be a good candidate for video EEG monitoring to classify seizures. She does not drive. She will follow-up in 1 year or earlier if needed.   Thank you for allowing me to participate in her care.  Please do not hesitate to call for any questions or concerns.  The duration  of this appointment visit was 15 minutes of face-to-face time with the patient.  Greater than 50% of this time was spent in counseling, explanation of diagnosis, planning of further management, and coordination of care.   Patrcia Dolly, M.D.   CC: Dierdre Searles, MD

## 2016-02-12 MED FILL — traMADol HCL 50 MG TABS: 50 | 2 days supply | Qty: 20 | Fill #0

## 2016-02-18 ENCOUNTER — Ambulatory Visit: Payer: Medicaid Other | Attending: Internal Medicine | Admitting: Internal Medicine

## 2016-02-18 ENCOUNTER — Encounter: Payer: Self-pay | Admitting: Internal Medicine

## 2016-02-18 VITALS — BP 110/72 | HR 68 | Temp 97.6°F | Resp 16 | Wt 205.4 lb

## 2016-02-18 DIAGNOSIS — Z1321 Encounter for screening for nutritional disorder: Secondary | ICD-10-CM

## 2016-02-18 DIAGNOSIS — I10 Essential (primary) hypertension: Secondary | ICD-10-CM | POA: Diagnosis not present

## 2016-02-18 DIAGNOSIS — E119 Type 2 diabetes mellitus without complications: Secondary | ICD-10-CM | POA: Diagnosis not present

## 2016-02-18 DIAGNOSIS — Z5189 Encounter for other specified aftercare: Secondary | ICD-10-CM | POA: Diagnosis present

## 2016-02-18 DIAGNOSIS — Z79899 Other long term (current) drug therapy: Secondary | ICD-10-CM | POA: Diagnosis not present

## 2016-02-18 DIAGNOSIS — Z1231 Encounter for screening mammogram for malignant neoplasm of breast: Secondary | ICD-10-CM

## 2016-02-18 DIAGNOSIS — Z1329 Encounter for screening for other suspected endocrine disorder: Secondary | ICD-10-CM

## 2016-02-18 DIAGNOSIS — Z7984 Long term (current) use of oral hypoglycemic drugs: Secondary | ICD-10-CM | POA: Diagnosis not present

## 2016-02-18 DIAGNOSIS — Z1239 Encounter for other screening for malignant neoplasm of breast: Secondary | ICD-10-CM

## 2016-02-18 DIAGNOSIS — Z90710 Acquired absence of both cervix and uterus: Secondary | ICD-10-CM | POA: Diagnosis not present

## 2016-02-18 LAB — CBC WITH DIFFERENTIAL/PLATELET
BASOS ABS: 0 {cells}/uL (ref 0–200)
Basophils Relative: 0 %
EOS ABS: 138 {cells}/uL (ref 15–500)
Eosinophils Relative: 2 %
HCT: 38.7 % (ref 35.0–45.0)
HEMOGLOBIN: 12.7 g/dL (ref 11.7–15.5)
Lymphocytes Relative: 27 %
Lymphs Abs: 1863 cells/uL (ref 850–3900)
MCH: 26.8 pg — AB (ref 27.0–33.0)
MCHC: 32.8 g/dL (ref 32.0–36.0)
MCV: 81.6 fL (ref 80.0–100.0)
MONOS PCT: 7 %
MPV: 9.6 fL (ref 7.5–12.5)
Monocytes Absolute: 483 cells/uL (ref 200–950)
NEUTROS ABS: 4416 {cells}/uL (ref 1500–7800)
NEUTROS PCT: 64 %
PLATELETS: 355 10*3/uL (ref 140–400)
RBC: 4.74 MIL/uL (ref 3.80–5.10)
RDW: 15.3 % — ABNORMAL HIGH (ref 11.0–15.0)
WBC: 6.9 10*3/uL (ref 3.8–10.8)

## 2016-02-18 LAB — CMP AND LIVER
ALBUMIN: 4.1 g/dL (ref 3.6–5.1)
ALK PHOS: 92 U/L (ref 33–130)
ALT: 17 U/L (ref 6–29)
AST: 19 U/L (ref 10–35)
BILIRUBIN INDIRECT: 0.2 mg/dL (ref 0.2–1.2)
BILIRUBIN TOTAL: 0.3 mg/dL (ref 0.2–1.2)
BUN: 9 mg/dL (ref 7–25)
Bilirubin, Direct: 0.1 mg/dL (ref <=0.2)
CO2: 23 mmol/L (ref 20–31)
CREATININE: 0.89 mg/dL (ref 0.50–1.05)
Calcium: 9.7 mg/dL (ref 8.6–10.4)
Chloride: 101 mmol/L (ref 98–110)
GLUCOSE: 86 mg/dL (ref 65–99)
Potassium: 4.2 mmol/L (ref 3.5–5.3)
SODIUM: 138 mmol/L (ref 135–146)
TOTAL PROTEIN: 7.5 g/dL (ref 6.1–8.1)

## 2016-02-18 LAB — LIPID PANEL
Cholesterol: 98 mg/dL (ref <200)
HDL: 43 mg/dL — AB (ref >50)
LDL CALC: 30 mg/dL (ref <100)
TRIGLYCERIDES: 124 mg/dL (ref <150)
Total CHOL/HDL Ratio: 2.3 Ratio (ref <5.0)
VLDL: 25 mg/dL (ref <30)

## 2016-02-18 LAB — GLUCOSE, POCT (MANUAL RESULT ENTRY): POC GLUCOSE: 125 mg/dL — AB (ref 70–99)

## 2016-02-18 LAB — TSH: TSH: 1.6 mIU/L

## 2016-02-18 MED ORDER — AMLODIPINE BESYLATE 10 MG PO TABS: 10.0000 mg | ORAL_TABLET | Freq: Every day | ORAL | 3 refills | Status: DC

## 2016-02-18 MED ORDER — LISINOPRIL-HYDROCHLOROTHIAZIDE 20-12.5 MG PO TABS: 2.0000 | ORAL_TABLET | Freq: Every day | ORAL | 3 refills | Status: DC

## 2016-02-18 MED ORDER — METFORMIN HCL 500 MG PO TABS: 500.0000 mg | ORAL_TABLET | Freq: Every day | ORAL | 3 refills | Status: DC

## 2016-02-18 MED FILL — LISINOPRIL-HCTZ 20-12.5 MG: 20-12.5 | 30 days supply | Qty: 60 | Fill #2

## 2016-02-18 MED FILL — ?AMLODIPINE BESYLATE 10 MG: 10 | 30 days supply | Qty: 30 | Fill #0

## 2016-02-18 NOTE — Progress Notes (Signed)
[name], is a 61 y.o. female  [id number]  [id number]  DOB - March 10, 1955  Chief Complaint  Patient presents with  . Diabetes  . Hypertension        Subjective:   [name] is a 60 y.o. female here today for a follow up visit, last seen 10/15/15, w/ hx of htn, predm, sz disorder followed by Neuro.  Pt is doing well, no c/o , no recent illnesses, taking all her meds as prescribed w/o difficulty. She states she has given up pork and ground beef, eating more salads and chicken.    Patient has No headache, No chest pain, No abdominal pain - No Nausea, No new weakness tingling or numbness, No Cough - SOB.  No tob/etoh.  Not sexually active.  No problems updated.  ALLERGIES: Allergies  Allergen Reactions  . Codeine Anaphylaxis  . Shellfish Allergy Swelling and Rash    Throat Sweling  . Banana Rash    Cannot eat any while taking KEPPRA  . Catfish [Fish Allergy] Rash  . Orange Fruit [Citrus] Rash    Cannot have while taking KEPPRA  . Tomato Itching and Rash    PAST MEDICAL HISTORY: Past Medical History:  Diagnosis Date  . Carpal tunnel syndrome   . Diabetes mellitus without complication (HCC)   . DYSTONIA 01/04/2007   Qualifier: Diagnosis of  By: Humberto Seals NP, Darl Pikes    . Facial droop 10/09/2013  . Hemorrhoid   . Hypertension   . Major depressive disorder, recurrent episode (HCC) 03/18/2006   Qualifier: Diagnosis of  By: Bradly Bienenstock    . Obesity, unspecified 10/17/2007   Qualifier: Diagnosis of  By: Gerilyn Pilgrim PHD, Jeannie    . Prediabetes 10/09/2013  . Rectal bleeding 07/03/2015  . Seizures (HCC)   . Seizures (HCC) 08/24/2013    MEDICATIONS AT HOME: Prior to Admission medications   Medication Sig Start Date End Date Taking? Authorizing Provider  acetaminophen (TYLENOL) 500 MG tablet Take 500 mg by mouth every 6 (six) hours as needed.    Historical Provider, MD  amLODipine (NORVASC) 10 MG tablet Take 1 tablet (10 mg total) by mouth daily. 02/18/16   Pete Glatter, MD  amoxicillin (AMOXIL) 500 MG capsule Take 1 capsule (500 mg total) by mouth 3 (three) times daily. Patient not taking: Reported on 02/18/2016 01/09/16   Anders Simmonds, PA-C  fluticasone [facility]) 50 MCG/ACT nasal spray Place 2 sprays into both nostrils daily. 01/09/16   Anders Simmonds, PA-C  levETIRAcetam (KEPPRA XR) 500 MG 24 hr tablet Take 2 tablets (1,000 mg total) by mouth 2 (two) times daily. 12/31/15   Pete Glatter, MD  lisinopril-hydrochlorothiazide (ZESTORETIC) 20-12.5 MG tablet Take 2 tablets by mouth daily. 02/18/16   Pete Glatter, MD  loratadine (CLARITIN) 10 MG tablet  07/29/15   Historical Provider, MD  metFORMIN (GLUCOPHAGE) 500 MG tablet Take 1 tablet (500 mg total) by mouth daily with breakfast. 02/18/16   Pete Glatter, MD  mirtazapine (REMERON) 15 MG tablet Take 1 tablet (15 mg total) by mouth at bedtime. 01/06/16   Neysa Hotter, MD     Objective:   Vitals:   02/18/16 0915  BP: 110/72  Pulse: 68  Resp: 16  Temp: 97.6 F (36.4 C)  TempSrc: Oral  SpO2: 96%  Weight: 205 lb 6.4 oz (93.2 kg)    Exam General appearance : Awake, alert, not in any distress. Speech Clear. Not toxic looking, pleasant, morbid obese. HEENT: Atraumatic and Normocephalic,  pupils equally reactive to light. Neck: supple, no JVD. bilat TMs clear. Chest:Good air entry bilaterally, no added sounds. CVS: S1 S2 regular, no murmurs/gallups or rubs. Abdomen: Bowel sounds  Nml, obese, Non tender and not distended with no gaurding, rigidity or rebound. Extremities: B/L Lower Ext shows no edema, both legs are warm to touch Neurology: Awake alert, and oriented X 3, CN II-XII grossly intact, Non focal Skin:No Rash  Data Review Lab Results  Component Value Date   HGBA1C 5.8 10/15/2015   HGBA1C 6.1 05/29/2015   HGBA1C 6.0 02/20/2015    Dep[facility] 01/09/2016 10/15/2015 05/29/2015 02/20/2015  Decreased Interest 0 (No Data) 3 0  Down, Depressed, Hopeless 0 3 3 3     PHQ - 2 Score 0 3 6 3   Altered sleeping 0 (No Data) 3 0  Tired, decreased energy 0 (No Data) 3 3  Change in appetite 0 (No Data) 3 0  Feeling bad or failure about yourself  0 (No Data) 3 3  Trouble concentrating 0 (No Data) 3 3  Moving slowly or fidgety/restless 0 (No Data) 3 3  Suicidal thoughts 0 (No Data) 3 0  PHQ-9 Score 0 - 27 15  Difficult doing work/chores - - Extremely dIfficult -      Assessment & Plan   1. Type 2 diabetes mellitus without complication, without long-term current use of insulin (HCC) - currently in prediabetes range control.   - continue metformin, low carb diet recd, increase exercise.  Info provided - POCT glucose (manual entry) - metFORMIN (GLUCOPHAGE) 500 MG tablet; Take 1 tablet (500 mg total) by mouth daily with breakfast.  Dispense: 90 tablet; Refill: 3 - Lipid Panel  2. Essential hypertension, benign Well controlled, continue DASH diet, no changes on rx. - amLODipine (NORVASC) 10 MG tablet; Take 1 tablet (10 mg total) by mouth daily.  Dispense: 90 tablet; Refill: 3 - lisinopril-hydrochlorothiazide (ZESTORETIC) 20-12.5 MG tablet; Take 2 tablets by mouth daily.  Dispense: 180 tablet; Refill: 3 - CMP and Liver - CBC with Differential  3. Breast cancer screening - MM Digital Screening; Future  - scholarship ppwk provided  4. Encounter for vitamin deficiency screening - recd calcium 1200 mg/ otc for now daily, waiting for vit d levels. - VITAMIN D 25 Hydroxy (Vit-D Deficiency, Fractures)  5. Thyroid disorder screen - TSH  6. uptodate on all vaccinations. Prior hysterectomy, not pap candidate.   Patient have been counseled extensively about nutrition and exercise  Return in about 3 months (around 05/18/2016), or if symptoms worsen or fail to improve.  The patient was given clear instructions to go to ER or return to medical center if symptoms don't improve, worsen or new problems develop. The patient verbalized understanding. The patient was  told to call to get lab results if they haven't heard anything in the next week.   This note has been created with Education officer, environmentalDragon speech recognition software and smart phrase technology. Any transcriptional errors are unintentional.   Pete Glatterawn T Piero Mustard, MD, MBA/MHA [facility] [place], [place] [phone]   02/18/2016, 9:43 AM

## 2016-02-18 NOTE — Patient Instructions (Signed)
Take daily: Calcium 1,200 mg daily for bone health - over the counter.  - DASH Eating Plan DASH stands for "Dietary Approaches to Stop Hypertension." The DASH eating plan is a healthy eating plan that has been shown to reduce high blood pressure (hypertension). Additional health benefits may include reducing the risk of type 2 diabetes mellitus, heart disease, and stroke. The DASH eating plan may also help with weight loss. What do I need to know about the DASH eating plan? For the DASH eating plan, you will follow these general guidelines:  Choose foods with less than 150 milligrams of sodium per serving (as listed on the food label).  Use salt-free seasonings or herbs instead of table salt or sea salt.  Check with your health care provider or pharmacist before using salt substitutes.  Eat lower-sodium products. These are often labeled as "low-sodium" or "no salt added."  Eat fresh foods. Avoid eating a lot of canned foods.  Eat more vegetables, fruits, and low-fat dairy products.  Choose whole grains. Look for the word "whole" as the first word in the ingredient list.  Choose fish and skinless chicken or [place] more often than red meat. Limit fish, poultry, and meat to 6 oz (170 g) each day.  Limit sweets, desserts, sugars, and sugary drinks.  Choose heart-healthy fats.  Eat more home-cooked food and less restaurant, buffet, and fast food.  Limit fried foods.  Do not fry foods. Cook foods using methods such as baking, boiling, grilling, and broiling instead.  When eating at a restaurant, ask that your food be prepared with less salt, or no salt if possible. What foods can I eat? Seek help from a dietitian for individual calorie needs. Grains  Whole grain or whole wheat bread. Brown rice. Whole grain or whole wheat pasta. Quinoa, bulgur, and whole grain cereals. Low-sodium cereals. Corn or whole wheat flour tortillas. Whole grain cornbread. Whole grain crackers. Low-sodium  crackers. Vegetables  Fresh or frozen vegetables (raw, steamed, roasted, or grilled). Low-sodium or reduced-sodium tomato and vegetable juices. Low-sodium or reduced-sodium tomato sauce and paste. Low-sodium or reduced-sodium canned vegetables. Fruits  All fresh, canned (in natural juice), or frozen fruits. Meat and Other Protein Products  Ground beef (85% or leaner), grass-fed beef, or beef trimmed of fat. Skinless chicken or [place]. Ground chicken or [place]. Pork trimmed of fat. All fish and seafood. Eggs. Dried beans, peas, or lentils. Unsalted nuts and seeds. Unsalted canned beans. Dairy  Low-fat dairy products, such as skim or 1% milk, 2% or reduced-fat cheeses, low-fat ricotta or cottage cheese, or plain low-fat yogurt. Low-sodium or reduced-sodium cheeses. Fats and Oils  Tub margarines without trans fats. Light or reduced-fat mayonnaise and salad dressings (reduced sodium). Avocado. Safflower, olive, or canola oils. Natural peanut or almond butter. Other  Unsalted popcorn and pretzels. The items listed above may not be a complete list of recommended foods or beverages. Contact your dietitian for more options.  What foods are not recommended? Grains  White bread. White pasta. White rice. Refined cornbread. Bagels and croissants. Crackers that contain trans fat. Vegetables  Creamed or fried vegetables. Vegetables in a cheese sauce. Regular canned vegetables. Regular canned tomato sauce and paste. Regular tomato and vegetable juices. Fruits  Canned fruit in light or heavy syrup. Fruit juice. Meat and Other Protein Products  Fatty cuts of meat. Ribs, chicken wings, bacon, sausage, bologna, salami, chitterlings, fatback, hot dogs, bratwurst, and packaged luncheon meats. Salted nuts and seeds. Canned beans with salt. Dairy  Whole or 2% milk, cream, half-and-half, and cream cheese. Whole-fat or sweetened yogurt. Full-fat cheeses or blue cheese. Nondairy creamers and whipped toppings.  Processed cheese, cheese spreads, or cheese curds. Condiments  Onion and garlic salt, seasoned salt, table salt, and sea salt. Canned and packaged gravies. Worcestershire sauce. Tartar sauce. Barbecue sauce. Teriyaki sauce. Soy sauce, including reduced sodium. Steak sauce. Fish sauce. Oyster sauce. Cocktail sauce. Horseradish. Ketchup and mustard. Meat flavorings and tenderizers. Bouillon cubes. Hot sauce. Tabasco sauce. Marinades. Taco seasonings. Relishes. Fats and Oils  Butter, stick margarine, lard, shortening, ghee, and bacon fat. Coconut, palm kernel, or palm oils. Regular salad dressings. Other  Pickles and olives. Salted popcorn and pretzels. The items listed above may not be a complete list of foods and beverages to avoid. Contact your dietitian for more information.  Where can I find more information? National Heart, Lung, and Blood Institute: [employer] This information is not intended to replace advice given to you by your health care provider. Make sure you discuss any questions you have with your health care provider. Document Released: 12/25/2010 Document Revised: 06/13/2015 Document Reviewed: 11/09/2012 Elsevier Interactive Patient Education  2017 Elsevier Inc.   - Diabetes Mellitus and Food It is important for you to manage your blood sugar (glucose) level. Your blood glucose level can be greatly affected by what you eat. Eating healthier foods in the appropriate amounts throughout the day at about the same time each day will help you control your blood glucose level. It can also help slow or prevent worsening of your diabetes mellitus. Healthy eating may even help you improve the level of your blood pressure and reach or maintain a healthy weight. General recommendations for healthful eating and cooking habits include:  Eating meals and snacks regularly. Avoid going long periods of time without eating to lose weight.  Eating a diet  that consists mainly of plant-based foods, such as fruits, vegetables, nuts, legumes, and whole grains.  Using low-heat cooking methods, such as baking, instead of high-heat cooking methods, such as deep frying. Work with your dietitian to make sure you understand how to use the Nutrition Facts information on food labels. How can food affect me? Carbohydrates  Carbohydrates affect your blood glucose level more than any other type of food. Your dietitian will help you determine how many carbohydrates to eat at each meal and teach you how to count carbohydrates. Counting carbohydrates is important to keep your blood glucose at a healthy level, especially if you are using insulin or taking certain medicines for diabetes mellitus. Alcohol  Alcohol can cause sudden decreases in blood glucose (hypoglycemia), especially if you use insulin or take certain medicines for diabetes mellitus. Hypoglycemia can be a life-threatening condition. Symptoms of hypoglycemia (sleepiness, dizziness, and disorientation) are similar to symptoms of having too much alcohol. If your health care provider has given you approval to drink alcohol, do so in moderation and use the following guidelines:  Women should not have more than one drink per day, and men should not have more than two drinks per day. One drink is equal to:  12 oz of beer.  5 oz of wine.  1 oz of hard liquor.  Do not drink on an empty stomach.  Keep yourself hydrated. Have water, diet soda, or unsweetened iced tea.  Regular soda, juice, and other mixers might contain a lot of carbohydrates and should be counted. What foods are not recommended? As you make food choices, it is important to remember that all  foods are not the same. Some foods have fewer nutrients per serving than other foods, even though they might have the same number of calories or carbohydrates. It is difficult to get your body what it needs when you eat foods with fewer nutrients.  Examples of foods that you should avoid that are high in calories and carbohydrates but low in nutrients include:  Trans fats (most processed foods list trans fats on the Nutrition Facts label).  Regular soda.  Juice.  Candy.  Sweets, such as cake, pie, doughnuts, and cookies.  Fried foods. What foods can I eat? Eat nutrient-rich foods, which will nourish your body and keep you healthy. The food you should eat also will depend on several factors, including:  The calories you need.  The medicines you take.  Your weight.  Your blood glucose level.  Your blood pressure level.  Your cholesterol level. You should eat a variety of foods, including:  Protein.  Lean cuts of meat.  Proteins low in saturated fats, such as fish, egg whites, and beans. Avoid processed meats.  Fruits and vegetables.  Fruits and vegetables that may help control blood glucose levels, such as apples, mangoes, and yams.  Dairy products.  Choose fat-free or low-fat dairy products, such as milk, yogurt, and cheese.  Grains, bread, pasta, and rice.  Choose whole grain products, such as multigrain bread, whole oats, and brown rice. These foods may help control blood pressure.  Fats.  Foods containing healthful fats, such as nuts, avocado, olive oil, canola oil, and fish. Does everyone with diabetes mellitus have the same meal plan? Because every person with diabetes mellitus is different, there is not one meal plan that works for everyone. It is very important that you meet with a dietitian who will help you create a meal plan that is just right for you. This information is not intended to replace advice given to you by your health care provider. Make sure you discuss any questions you have with your health care provider. Document Released: 10/02/2004 Document Revised: 06/13/2015 Document Reviewed: 12/02/2012 Elsevier Interactive Patient Education  2017 Elsevier Inc.  -  Diabetes Mellitus and  Exercise Exercising regularly is important for your overall health, especially when you have diabetes (diabetes mellitus). Exercising is not only about losing weight. It has many health benefits, such as increasing muscle strength and bone density and reducing body fat and stress. This leads to improved fitness, flexibility, and endurance, all of which result in better overall health. Exercise has additional benefits for people with diabetes, including:  Reducing appetite.  Helping to lower and control blood glucose.  Lowering blood pressure.  Helping to control amounts of fatty substances (lipids) in the blood, such as cholesterol and triglycerides.  Helping the body to respond better to insulin (improving insulin sensitivity).  Reducing how much insulin the body needs.  Decreasing the risk for heart disease by:  Lowering cholesterol and triglyceride levels.  Increasing the levels of good cholesterol.  Lowering blood glucose levels. What is my activity plan? Your health care provider or certified diabetes educator can help you make a plan for the type and frequency of exercise (activity plan) that works for you. Make sure that you:  Do at least 150 minutes of moderate-intensity or vigorous-intensity exercise each week. This could be brisk walking, biking, or water aerobics.  Do stretching and strength exercises, such as yoga or weightlifting, at least 2 times a week.  Spread out your activity over at least 3 days  of the week.  Get some form of physical activity every day.  Do not go more than 2 days in a row without some kind of physical activity.  Avoid being inactive for more than 90 minutes at a time. Take frequent breaks to walk or stretch.  Choose a type of exercise or activity that you enjoy, and set realistic goals.  Start slowly, and gradually increase the intensity of your exercise over time. What do I need to know about managing my diabetes?  Check your blood  glucose before and after exercising.  If your blood glucose is higher than 240 mg/dL (29.513.3 mmol/L) before you exercise, check your urine for ketones. If you have ketones in your urine, do not exercise until your blood glucose returns to normal.  Know the symptoms of low blood glucose (hypoglycemia) and how to treat it. Your risk for hypoglycemia increases during and after exercise. Common symptoms of hypoglycemia can include:  Hunger.  Anxiety.  Sweating and feeling clammy.  Confusion.  Dizziness or feeling light-headed.  Increased heart rate or palpitations.  Blurry vision.  Tingling or numbness around the mouth, lips, or tongue.  Tremors or shakes.  Irritability.  Keep a rapid-acting carbohydrate snack available before, during, and after exercise to help prevent or treat hypoglycemia.  Avoid injecting insulin into areas of the body that are going to be exercised. For example, avoid injecting insulin into:  The arms, when playing tennis.  The legs, when jogging.  Keep records of your exercise habits. Doing this can help you and your health care provider adjust your diabetes management plan as needed. Write down:  Food that you eat before and after you exercise.  Blood glucose levels before and after you exercise.  The type and amount of exercise you have done.  When your insulin is expected to peak, if you use insulin. Avoid exercising at times when your insulin is peaking.  When you start a new exercise or activity, work with your health care provider to make sure the activity is safe for you, and to adjust your insulin, medicines, or food intake as needed.  Drink plenty of water while you exercise to prevent dehydration or heat stroke. Drink enough fluid to keep your urine clear or pale yellow. This information is not intended to replace advice given to you by your health care provider. Make sure you discuss any questions you have with your health care  provider. Document Released: 03/28/2003 Document Revised: 07/26/2015 Document Reviewed: 06/17/2015 Elsevier Interactive Patient Education  2017 [employer]

## 2016-02-19 ENCOUNTER — Telehealth: Payer: Self-pay

## 2016-02-19 LAB — VITAMIN D 25 HYDROXY (VIT D DEFICIENCY, FRACTURES): Vit D, 25-Hydroxy: 25 ng/mL — ABNORMAL LOW (ref 30–100)

## 2016-02-19 NOTE — Telephone Encounter (Signed)
Contacted pt to go over lab results pt didn't answer lvm asking pt to give me a call at her earliest convenience  

## 2016-02-26 NOTE — Progress Notes (Signed)
BH MD/PA/NP OP Progress Note  03/02/2016 8:34 AM Talyssa Gibas  MRN:  [id number]  Chief Complaint:  Chief Complaint    Follow-up; Anxiety; Depression     Subjective: "I"m hanging in there" HPI:  Patient presents for follow up appointment. She talks about her frustration at [employer]; felt mistreated. She also endorses neuropathic pain. She feels good overall despite these situations. She reports occasional anxiety when she goes to social services. She had a "seizure" of right knee shaking last Wednesday. She denies insomnia. She denies SI. She continues to have VH of seeing angel, bad spirits, and ghosts since 1919, although she denies it bothering her. She denies AH. She lost two pounds in the last two months.   Visit Diagnosis:    ICD-9-CM ICD-10-CM   1. Adjustment disorder with mixed anxiety and depressed mood 309.28 F43.23     Past Psychiatric History:  Outpatient: denies Psychiatry admission: for a week in 08/2013 when she had "black out" with concern for drug use per report Previous suicide attempt: overdosing pills when she was a middle school student when she was bullied,  Past trials of medication: Zoloft  Past Medical History:  Past Medical History:  Diagnosis Date  . Carpal tunnel syndrome   . Diabetes mellitus without complication (HCC)   . DYSTONIA 01/04/2007   Qualifier: Diagnosis of  By: Humberto Seals NP, Darl Pikes    . Facial droop 10/09/2013  . Hemorrhoid   . Hypertension   . Major depressive disorder, recurrent episode (HCC) 03/18/2006   Qualifier: Diagnosis of  By: Bradly Bienenstock    . Obesity, unspecified 10/17/2007   Qualifier: Diagnosis of  By: Gerilyn Pilgrim PHD, Jeannie    . Prediabetes 10/09/2013  . Rectal bleeding 07/03/2015  . Seizures (HCC)   . Seizures (HCC) 08/24/2013    Past Surgical History:  Procedure Laterality Date  . ABDOMINAL HYSTERECTOMY    . COLONOSCOPY N/A 07/15/2015   Dr. Darrick Penna: small-mouth diverticula, small non-bleeding internal  hemorrhoids, redundant colon   . HERNIA REPAIR    . MYOMECTOMY      Family Psychiatric History:  Brother- schizophrenia/bipolar  Family History:  Family History  Problem Relation Age of Onset  . Asthma Mother   . Hypertension Father   . Heart disease Father   . Stroke Father   . Cancer Sister     uterus cancer   . Diabetes Maternal Uncle   . Colon cancer Neg Hx     Social History:  Social History   Social History  . Marital status: Divorced    Spouse name: N/A  . Number of children: 1  . Years of education: N/A   Social History Main Topics  . Smoking status: Never Smoker  . Smokeless tobacco: Never Used  . Alcohol use No  . Drug use: No  . Sexual activity: Not Asked   Other Topics Concern  . None   Social History Narrative  . None   Lives by herself, she has a sister in [place], and her brother in [place], and other brother in [place]   Divorced in July 1992, one daughter 74 Moved from [place] in 1992 Work: works for [employer], last in 2007  Allergies:  Allergies  Allergen Reactions  . Codeine Anaphylaxis  . Shellfish Allergy Swelling and Rash    Throat Sweling  . Banana Rash    Cannot eat any while taking KEPPRA  . Catfish [Fish Allergy] Rash  . Orange Fruit [Citrus] Rash    Cannot  have while taking KEPPRA  . Tomato Itching and Rash    Metabolic Disorder Labs: Lab Results  Component Value Date   HGBA1C 5.8 10/15/2015   No results found for: PROLACTIN Lab Results  Component Value Date   CHOL 98 02/18/2016   TRIG 124 02/18/2016   HDL 43 (L) 02/18/2016   CHOLHDL 2.3 02/18/2016   VLDL 25 02/18/2016   LDLCALC 30 02/18/2016   LDLCALC 58 02/20/2015     Current Medications: Current Outpatient Prescriptions  Medication Sig Dispense Refill  . acetaminophen (TYLENOL) 500 MG tablet Take 500 mg by mouth every 6 (six) hours as needed.    [name]. amLODipine (NORVASC) 10 MG tablet Take 1 tablet (10 mg total) by mouth daily. 90 tablet 3  .  amoxicillin (AMOXIL) 500 MG capsule Take 1 capsule (500 mg total) by mouth 3 (three) times daily. (Patient not taking: Reported on 02/18/2016) 30 capsule 0  . fluticasone (FLONASE) 50 MCG/ACT nasal spray Place 2 sprays into both nostrils daily. 16 g 6  . levETIRAcetam (KEPPRA XR) 500 MG 24 hr tablet Take 2 tablets (1,000 mg total) by mouth 2 (two) times daily. 300 tablet 3  . lisinopril-hydrochlorothiazide (ZESTORETIC) 20-12.5 MG tablet Take 2 tablets by mouth daily. 180 tablet 3  . loratadine (CLARITIN) 10 MG tablet   11  . metFORMIN (GLUCOPHAGE) 500 MG tablet Take 1 tablet (500 mg total) by mouth daily with breakfast. 90 tablet 3  . mirtazapine (REMERON) 15 MG tablet Take 1 tablet (15 mg total) by mouth at bedtime. 30 tablet 1   No current facility-administered medications for this visit.     Lab Results  Component Value Date   TSH 1.60 02/18/2016   Lab Results  Component Value Date   CHOL 98 02/18/2016   HDL 43 (L) 02/18/2016   LDLCALC 30 02/18/2016   TRIG 124 02/18/2016   CHOLHDL 2.3 02/18/2016     Neurologic: Headache: Negative Seizure: No Paresthesias: No  Musculoskeletal: Strength & Muscle Tone: within normal limits Gait & Station: uses a cane Patient leans: N/A  Psychiatric Specialty Exam: Review of Systems  Psychiatric/Behavioral: Positive for hallucinations. Negative for depression, substance abuse and suicidal ideas. The patient is nervous/anxious. The patient does not have insomnia.   All other systems reviewed and are negative.   Blood pressure 124/72, pulse 85, height 5\' 2"  (1.575 m), weight 205 lb 3.2 oz (93.1 kg).Body mass index is 37.53 kg/m.  General Appearance: Casual  Eye Contact:  Fair  Speech:  Clear and Coherent  Volume:  Normal  Mood:  good  Affect:  Congruent and Full Range  Thought Process:  Coherent and Goal Directed  Orientation:  Full (Time, Place, and Person)  Thought Content: Logical  Perceptions: VH of seeing angel, bad spirits, and  ghosts since 1919, denies AH  Suicidal Thoughts:  No  Homicidal Thoughts:  No  Memory:  Immediate;   Good Recent;   Good Remote;   Good  Judgement:  Good  Insight:  Fair  Psychomotor Activity:  Normal  Concentration:  Concentration: Good and Attention Span: Good  Recall:  Good  Fund of Knowledge: Good  Language: Good  Akathisia:  No  Handed:  Right  AIMS (if indicated):  N/A  Assets:  Communication Skills Desire for Improvement  ADL's:  Intact  Cognition: WNL  Sleep:  good   Assessment Ronnald RampRenee Runco is 61 year old female with depression, hypertension, diabetes, history of seizure who presented for depression and anxiety.   #  Adjustment disorder with anxiety and depressed mood There has been significantly improvement in her mood which coincided with approval of her social security and being started on mirtazapine. She appears to cope well with her stress; will continue current dose at this time. Noted that there has been improvement in her "seizure" like episode as her mood improves.  Will continue to monitor it as well as her chronic AH (it is less likely to be related to psychotic disorder given lack of other symptoms.)   Plan 1. Continue mirtazapine 15 mg at night  -Lipid, TSH on 02/18/2016 reviewed- wnl 2. Return to clinic in 3 months - Patient is on Keppra 1000 mg BID)  The patient demonstrates the following risk factors for suicide: Chronic risk factors for suicide include: psychiatric disorder of anxiety, previous suicide attempts of overdosing medication and chronic pain. Acute risk factors for suicide include: unemployment. Protective factors for this patient include: coping skills and hope for the future. Considering these factors, the overall suicide risk at this point appears to be low. Patient is appropriate for outpatient follow up.  Treatment Plan Summary: Plan as above    Neysa Hotter, MD 03/02/2016, 8:34 AM

## 2016-03-02 ENCOUNTER — Encounter (HOSPITAL_COMMUNITY): Payer: Self-pay | Admitting: Psychiatry

## 2016-03-02 ENCOUNTER — Ambulatory Visit (INDEPENDENT_AMBULATORY_CARE_PROVIDER_SITE_OTHER): Payer: No Typology Code available for payment source | Admitting: Psychiatry

## 2016-03-02 VITALS — BP 124/72 | HR 85 | Ht 62.0 in | Wt 205.2 lb

## 2016-03-02 DIAGNOSIS — Z91018 Allergy to other foods: Secondary | ICD-10-CM

## 2016-03-02 DIAGNOSIS — Z9889 Other specified postprocedural states: Secondary | ICD-10-CM

## 2016-03-02 DIAGNOSIS — Z90710 Acquired absence of both cervix and uterus: Secondary | ICD-10-CM

## 2016-03-02 DIAGNOSIS — Z8249 Family history of ischemic heart disease and other diseases of the circulatory system: Secondary | ICD-10-CM

## 2016-03-02 DIAGNOSIS — F4323 Adjustment disorder with mixed anxiety and depressed mood: Secondary | ICD-10-CM

## 2016-03-02 DIAGNOSIS — Z825 Family history of asthma and other chronic lower respiratory diseases: Secondary | ICD-10-CM

## 2016-03-02 DIAGNOSIS — Z91013 Allergy to seafood: Secondary | ICD-10-CM

## 2016-03-02 DIAGNOSIS — Z79899 Other long term (current) drug therapy: Secondary | ICD-10-CM

## 2016-03-02 DIAGNOSIS — Z808 Family history of malignant neoplasm of other organs or systems: Secondary | ICD-10-CM

## 2016-03-02 DIAGNOSIS — Z833 Family history of diabetes mellitus: Secondary | ICD-10-CM

## 2016-03-02 MED ORDER — MIRTAZAPINE 15 MG PO TABS: 15.0000 mg | ORAL_TABLET | Freq: Every day | ORAL | 2 refills | Status: DC

## 2016-03-02 MED FILL — MIRTAZAPINE 15 MG TABLET: 15 | 30 days supply | Qty: 30 | Fill #0

## 2016-03-02 NOTE — Patient Instructions (Signed)
1. Continue mirtazapine 15 mg at night 2. Return to clinic in 3 months

## 2016-03-11 MED FILL — LORATADINE 10 MG TABLET: 10 | 30 days supply | Qty: 30 | Fill #6

## 2016-03-11 MED FILL — FLUTICASONE PROP 50 MCG SPR: 50 | 30 days supply | Qty: 16 | Fill #2

## 2016-03-11 MED FILL — ?METFORMIN HCL 500MG TABLET: 500 | 30 days supply | Qty: 30 | Fill #0

## 2016-03-16 MED FILL — AMLODIPINE BESYLATE 10 MG T: 10 | 30 days supply | Qty: 30 | Fill #1

## 2016-03-16 MED FILL — LISINOPRIL-HCTZ 20-12.5 MG: 20-12.5 | 30 days supply | Qty: 60 | Fill #3

## 2016-03-25 ENCOUNTER — Other Ambulatory Visit: Payer: Self-pay | Admitting: Internal Medicine

## 2016-03-25 DIAGNOSIS — Z1231 Encounter for screening mammogram for malignant neoplasm of breast: Secondary | ICD-10-CM

## 2016-03-31 ENCOUNTER — Ambulatory Visit (HOSPITAL_COMMUNITY): Payer: Self-pay | Admitting: Licensed Clinical Social Worker

## 2016-04-15 ENCOUNTER — Ambulatory Visit
Admission: RE | Admit: 2016-04-15 | Discharge: 2016-04-15 | Disposition: A | Discharge status: Home / Self Care | Payer: Medicaid Other | Source: Ambulatory Visit | Attending: Internal Medicine | Admitting: Internal Medicine

## 2016-04-15 DIAGNOSIS — Z1231 Encounter for screening mammogram for malignant neoplasm of breast: Secondary | ICD-10-CM | POA: Diagnosis not present

## 2016-04-15 DIAGNOSIS — Z1239 Encounter for other screening for malignant neoplasm of breast: Secondary | ICD-10-CM

## 2016-04-16 ENCOUNTER — Telehealth: Payer: Self-pay

## 2016-04-16 NOTE — Telephone Encounter (Signed)
Contacted pt to go over mm results pt is aware of results and doesn't have any questions or concerns 

## 2016-04-28 MED FILL — LISINOPRIL-HCTZ 20-12.5 MG: 20-12.5 | 30 days supply | Qty: 60 | Fill #4

## 2016-04-28 MED FILL — LEVETIRACETAM ER 500 MG TAB: 500 | 15 days supply | Qty: 60 | Fill #1

## 2016-04-28 MED FILL — AMLODIPINE BESYLATE 10 MG T: 10 | 30 days supply | Qty: 30 | Fill #2

## 2016-05-05 MED FILL — ?METFORMIN HCL 500MG TABLET: 500 | 30 days supply | Qty: 30 | Fill #1

## 2016-05-08 MED FILL — MIRTAZAPINE 15 MG TABLET: 15 | 30 days supply | Qty: 30 | Fill #1

## 2016-05-08 MED FILL — LORATADINE 10 MG TABLET: 10 | 30 days supply | Qty: 30 | Fill #7

## 2016-05-08 MED FILL — FLUTICASONE PROP 50 MCG SPR: 50 | 30 days supply | Qty: 16 | Fill #3

## 2016-05-12 ENCOUNTER — Ambulatory Visit (INDEPENDENT_AMBULATORY_CARE_PROVIDER_SITE_OTHER): Payer: Medicaid Other | Admitting: Licensed Clinical Social Worker

## 2016-05-12 DIAGNOSIS — F331 Major depressive disorder, recurrent, moderate: Secondary | ICD-10-CM | POA: Diagnosis not present

## 2016-05-19 ENCOUNTER — Encounter (HOSPITAL_COMMUNITY): Payer: Self-pay | Admitting: Licensed Clinical Social Worker

## 2016-05-19 NOTE — Progress Notes (Signed)
THERAPIST PROGRESS NOTE  Session Time: 10:10-11am  Participation Level: Active  Behavioral Response: CasualAlert/anxious  Type of Therapy: Individual Therapy  Treatment Goals addressed: Coping  Interventions: Solution Focused  Summary: [name] is a 61 y.o. female. Pt presents happy to be back in therapy. Pt has not been to therapy in months. She was confused and thought when she saw Dr. Vanetta Shawl, that was therapy. Pt had a lot to report. She finally won her disability appeal and was awarded 32,000. Cautioned pt about having this large amount of $ and suggested she talk with her sister and brother in law about how to manage the $ Also suggested she not tell anyone about the amount she was awarded. Pt was in agreement of all suggestions and has already been approached by some family members. Role played with pt how to use boundaries. This may be difficult for pt to use boundaries but she is determined not to give her $ away. Pt now has medicaid and can take care of all her medical needs. She reports she had 2 seizures 11/17 & 1/18 all due to stress. Stress is her trigger for her seizures and she recognizes the trigger. Processed with pt her coping tools for stress. Pt admits she has minimal depression now due to life changes. She spoke a lot about her daughter and her relationship difficulties. [name] now has 3 children 4 mos, 2yo and 10yo. Daughter is also a Veterinary surgeon who has recently gotten licenced. Her daughter had been asking pt for 4 as well. Pt still maintains good relationships with most of her siblings who are her support. Pt has plans to move to [facility] to be near her sister who is a Horticulturist, commercial and her husband. She wants her own place and they have started the process for her to move. Pt appears in a good place. She has set goals for herself, she has started using boundaries, she is being fiscally responsible, she has identified her stressors and has tools in place.      Suicidal/Homicidal: Nowithout intent/plan  Therapist Response: Assessed pt's current functioning and reviewed progress. Assisted pt with her current needs and assistance in processing her management of stressors.  Plan: Return again in 2 weeks and will continue to see pt until she moves out of state.  Diagnosis:      Axis I: Major Depressive Disorder, recurrent episode moderate                              Margeaux Swantek S, LCAS-A 05/12/16

## 2016-05-25 ENCOUNTER — Ambulatory Visit (HOSPITAL_COMMUNITY): Payer: Self-pay | Admitting: Psychiatry

## 2016-05-25 MED FILL — AMLODIPINE BESYLATE 10 MG T: 10 | 30 days supply | Qty: 30 | Fill #3

## 2016-05-25 MED FILL — LEVETIRACETAM ER 500 MG TAB: 500 | 15 days supply | Qty: 60 | Fill #2

## 2016-05-25 MED FILL — LISINOPRIL-HCTZ 20-12.5 MG: 20-12.5 | 30 days supply | Qty: 60 | Fill #5

## 2016-06-02 ENCOUNTER — Ambulatory Visit (INDEPENDENT_AMBULATORY_CARE_PROVIDER_SITE_OTHER): Payer: Medicaid Other | Admitting: Licensed Clinical Social Worker

## 2016-06-02 DIAGNOSIS — F4323 Adjustment disorder with mixed anxiety and depressed mood: Secondary | ICD-10-CM | POA: Diagnosis not present

## 2016-06-04 ENCOUNTER — Encounter: Payer: Self-pay | Admitting: Internal Medicine

## 2016-06-05 ENCOUNTER — Encounter: Payer: Self-pay | Admitting: Internal Medicine

## 2016-06-08 ENCOUNTER — Ambulatory Visit: Payer: Medicaid Other | Attending: Internal Medicine | Admitting: Internal Medicine

## 2016-06-08 ENCOUNTER — Encounter: Payer: Self-pay | Admitting: Internal Medicine

## 2016-06-08 ENCOUNTER — Other Ambulatory Visit: Payer: Self-pay | Admitting: Internal Medicine

## 2016-06-08 ENCOUNTER — Encounter (HOSPITAL_COMMUNITY): Payer: Self-pay | Admitting: Licensed Clinical Social Worker

## 2016-06-08 VITALS — BP 129/85 | HR 82 | Temp 98.4°F | Resp 16 | Wt 208.2 lb

## 2016-06-08 DIAGNOSIS — F633 Trichotillomania: Secondary | ICD-10-CM | POA: Diagnosis not present

## 2016-06-08 DIAGNOSIS — I10 Essential (primary) hypertension: Secondary | ICD-10-CM

## 2016-06-08 DIAGNOSIS — F4323 Adjustment disorder with mixed anxiety and depressed mood: Secondary | ICD-10-CM

## 2016-06-08 DIAGNOSIS — R569 Unspecified convulsions: Secondary | ICD-10-CM | POA: Diagnosis not present

## 2016-06-08 DIAGNOSIS — Z91013 Allergy to seafood: Secondary | ICD-10-CM | POA: Diagnosis not present

## 2016-06-08 DIAGNOSIS — J302 Other seasonal allergic rhinitis: Secondary | ICD-10-CM | POA: Diagnosis not present

## 2016-06-08 DIAGNOSIS — R7303 Prediabetes: Secondary | ICD-10-CM | POA: Diagnosis not present

## 2016-06-08 DIAGNOSIS — E119 Type 2 diabetes mellitus without complications: Secondary | ICD-10-CM

## 2016-06-08 DIAGNOSIS — G4700 Insomnia, unspecified: Secondary | ICD-10-CM | POA: Diagnosis not present

## 2016-06-08 DIAGNOSIS — Z7984 Long term (current) use of oral hypoglycemic drugs: Secondary | ICD-10-CM | POA: Insufficient documentation

## 2016-06-08 DIAGNOSIS — G40909 Epilepsy, unspecified, not intractable, without status epilepticus: Secondary | ICD-10-CM | POA: Insufficient documentation

## 2016-06-08 DIAGNOSIS — Z79899 Other long term (current) drug therapy: Secondary | ICD-10-CM | POA: Insufficient documentation

## 2016-06-08 LAB — GLUCOSE, POCT (MANUAL RESULT ENTRY): POC Glucose: 95 mg/dl (ref 70–99)

## 2016-06-08 MED ORDER — AMLODIPINE BESYLATE 10 MG PO TABS: 10.0000 mg | ORAL_TABLET | Freq: Every day | ORAL | 3 refills | Status: DC

## 2016-06-08 MED ORDER — LEVETIRACETAM ER 500 MG PO TB24: 1000.0000 mg | ORAL_TABLET | Freq: Two times a day (BID) | ORAL | 3 refills | Status: DC

## 2016-06-08 MED ORDER — METFORMIN HCL 500 MG PO TABS: 500.0000 mg | ORAL_TABLET | Freq: Every day | ORAL | 3 refills | Status: DC

## 2016-06-08 MED ORDER — LORATADINE 10 MG PO TABS: 10.0000 mg | ORAL_TABLET | Freq: Every day | ORAL | 11 refills | Status: DC

## 2016-06-08 MED ORDER — LISINOPRIL-HYDROCHLOROTHIAZIDE 20-12.5 MG PO TABS: 2.0000 | ORAL_TABLET | Freq: Every day | ORAL | 3 refills | Status: DC

## 2016-06-08 MED ORDER — FLUTICASONE PROPIONATE 50 MCG/ACT NA SUSP: 2.0000 | Freq: Every day | NASAL | 6 refills | Status: DC

## 2016-06-08 MED FILL — LEVETIRACETAM ER 500 MG TAB: 500 | 30 days supply | Qty: 120 | Fill #3

## 2016-06-08 MED FILL — MIRTAZAPINE 15 MG TABLET: 15 | 30 days supply | Qty: 30 | Fill #2

## 2016-06-08 MED FILL — metFORMIN HCL 500 MG TABS: 500 | 30 days supply | Qty: 30 | Fill #2

## 2016-06-08 MED FILL — LORATADINE 10 MG TABLET: 10 | 30 days supply | Qty: 30 | Fill #0

## 2016-06-08 MED FILL — FLUTICASONE PROP 50 MCG SPR: 50 | 30 days supply | Qty: 16 | Fill #4

## 2016-06-08 NOTE — Progress Notes (Signed)
THERAPIST PROGRESS NOTE  Session Time: 10:20-10:50am  Participation Level: Active  Behavioral Response: CasualAlert/anxious  Type of Therapy: Individual Therapy  Treatment Goals addressed: Coping  Interventions: Solution Focused  Summary: Ronnald RampRenee Bardwell is a 61 y.o. female. Pt was late to her appt today due to the bus. Pt discussed her psychiatric symptoms and current life events. Pt is trying to come up with her new life plan. She asked for help in the life plan. Taught pt problem solving skills for a budget and life plan, what areas she wants to change and the steps to change. Pt wants to change her living situation, put some of her $ away, and develop a church home. Pt is still having a struggle with some family members who have approached her about her $. Role-played boundaries again with pt. Its obvious she is not comfortable with boundaries but understands the meaning of them. Pt was receptive to suggestions during session.     Suicidal/Homicidal: Nowithout intent/plan  Therapist Response: Assessed pt's current functioning and reviewed progress. Assisted pt with her life plan", boundaries and problem-solving skills. Assisted pt in processing her management of stressors.  Plan: Return again in 2 weeks and will continue to see pt until she moves out of state.  Diagnosis:      Axis I: Major Depressive Disorder, recurrent episode moderate                              MACKENZIE,LISBETH S, LCAS 06/02/16

## 2016-06-08 NOTE — Patient Instructions (Addendum)
Vit D 5,000 IU daily  + calcium 1210m/ daily -  - Health Maintenance, Female Adopting a healthy lifestyle and getting preventive care can go a long way to promote health and wellness. Talk with your health care provider about what schedule of regular examinations is right for you. This is a good chance for you to check in with your provider about disease prevention and staying healthy. In between checkups, there are plenty of things you can do on your own. Experts have done a lot of research about which lifestyle changes and preventive measures are most likely to keep you healthy. Ask your health care provider for more information. Weight and diet Eat a healthy diet  Be sure to include plenty of vegetables, fruits, low-fat dairy products, and lean protein.  Do not eat a lot of foods high in solid fats, added sugars, or salt.  Get regular exercise. This is one of the most important things you can do for your health.  Most adults should exercise for at least 150 minutes each week. The exercise should increase your heart rate and make you sweat (moderate-intensity exercise).  Most adults should also do strengthening exercises at least twice a week. This is in addition to the moderate-intensity exercise. Maintain a healthy weight  Body mass index (BMI) is a measurement that can be used to identify possible weight problems. It estimates body fat based on height and weight. Your health care provider can help determine your BMI and help you achieve or maintain a healthy weight.  For females 251years of age and older:  A BMI below 18.5 is considered underweight.  A BMI of 18.5 to 24.9 is normal.  A BMI of 25 to 29.9 is considered overweight.  A BMI of 30 and above is considered obese. Watch levels of cholesterol and blood lipids  You should start having your blood tested for lipids and cholesterol at 59years of age, then have this test every 5 years.  You may need to have your  cholesterol levels checked more often if:  Your lipid or cholesterol levels are high.  You are older than 59years of age.  You are at high risk for heart disease. Cancer screening Lung Cancer  Lung cancer screening is recommended for adults 530850years old who are at high risk for lung cancer because of a history of smoking.  A yearly low-dose CT scan of the lungs is recommended for people who:  Currently smoke.  Have quit within the past 15 years.  Have at least a 30-pack-year history of smoking. A pack year is smoking an average of one pack of cigarettes a day for 1 year.  Yearly screening should continue until it has been 15 years since you quit.  Yearly screening should stop if you develop a health problem that would prevent you from having lung cancer treatment. Breast Cancer  Practice breast self-awareness. This means understanding how your breasts normally appear and feel.  It also means doing regular breast self-exams. Let your health care provider know about any changes, no matter how small.  If you are in your 20s or 30s, you should have a clinical breast exam (CBE) by a health care provider every 1-3 years as part of a regular health exam.  If you are 436or older, have a CBE every year. Also consider having a breast X-ray (mammogram) every year.  If you have a family history of breast cancer, talk to your health care provider about  genetic screening.  If you are at high risk for breast cancer, talk to your health care provider about having an MRI and a mammogram every year.  Breast cancer gene (BRCA) assessment is recommended for women who have family members with BRCA-related cancers. BRCA-related cancers include:  Breast.  Ovarian.  Tubal.  Peritoneal cancers.  Results of the assessment will determine the need for genetic counseling and BRCA1 and BRCA2 testing. Cervical Cancer  Your health care provider may recommend that you be screened regularly for  cancer of the pelvic organs (ovaries, uterus, and vagina). This screening involves a pelvic examination, including checking for microscopic changes to the surface of your cervix (Pap test). You may be encouraged to have this screening done every 3 years, beginning at age 43.  For women ages 69-65, health care providers may recommend pelvic exams and Pap testing every 3 years, or they may recommend the Pap and pelvic exam, combined with testing for human papilloma virus (HPV), every 5 years. Some types of HPV increase your risk of cervical cancer. Testing for HPV may also be done on women of any age with unclear Pap test results.  Other health care providers may not recommend any screening for nonpregnant women who are considered low risk for pelvic cancer and who do not have symptoms. Ask your health care provider if a screening pelvic exam is right for you.  If you have had past treatment for cervical cancer or a condition that could lead to cancer, you need Pap tests and screening for cancer for at least 20 years after your treatment. If Pap tests have been discontinued, your risk factors (such as having a new sexual partner) need to be reassessed to determine if screening should resume. Some women have medical problems that increase the chance of getting cervical cancer. In these cases, your health care provider may recommend more frequent screening and Pap tests. Colorectal Cancer  This type of cancer can be detected and often prevented.  Routine colorectal cancer screening usually begins at 59 years of age and continues through 59 years of age.  Your health care provider may recommend screening at an earlier age if you have risk factors for colon cancer.  Your health care provider may also recommend using home test kits to check for hidden blood in the stool.  A small camera at the end of a tube can be used to examine your colon directly (sigmoidoscopy or colonoscopy). This is done to check for  the earliest forms of colorectal cancer.  Routine screening usually begins at age 32.  Direct examination of the colon should be repeated every 5-10 years through 59 years of age. However, you may need to be screened more often if early forms of precancerous polyps or small growths are found. Skin Cancer  Check your skin from head to toe regularly.  Tell your health care provider about any new moles or changes in moles, especially if there is a change in a mole's shape or color.  Also tell your health care provider if you have a mole that is larger than the size of a pencil eraser.  Always use sunscreen. Apply sunscreen liberally and repeatedly throughout the day.  Protect yourself by wearing long sleeves, pants, a wide-brimmed hat, and sunglasses whenever you are outside. Heart disease, diabetes, and high blood pressure  High blood pressure causes heart disease and increases the risk of stroke. High blood pressure is more likely to develop in:  People who have blood  pressure in the high end of the normal range (130-139/85-89 mm Hg).  People who are overweight or obese.  People who are African American.  If you are 69-80 years of age, have your blood pressure checked every 3-5 years. If you are 76 years of age or older, have your blood pressure checked every year. You should have your blood pressure measured twice-once when you are at a hospital or clinic, and once when you are not at a hospital or clinic. Record the average of the two measurements. To check your blood pressure when you are not at a hospital or clinic, you can use:  An automated blood pressure machine at a pharmacy.  A home blood pressure monitor.  If you are between 39 years and 29 years old, ask your health care provider if you should take aspirin to prevent strokes.  Have regular diabetes screenings. This involves taking a blood sample to check your fasting blood sugar level.  If you are at a normal weight and  have a low risk for diabetes, have this test once every three years after 59 years of age.  If you are overweight and have a high risk for diabetes, consider being tested at a younger age or more often. Preventing infection Hepatitis B  If you have a higher risk for hepatitis B, you should be screened for this virus. You are considered at high risk for hepatitis B if:  You were born in a country where hepatitis B is common. Ask your health care provider which countries are considered high risk.  Your parents were born in a high-risk country, and you have not been immunized against hepatitis B (hepatitis B vaccine).  You have HIV or AIDS.  You use needles to inject street drugs.  You live with someone who has hepatitis B.  You have had sex with someone who has hepatitis B.  You get hemodialysis treatment.  You take certain medicines for conditions, including cancer, organ transplantation, and autoimmune conditions. Hepatitis C  Blood testing is recommended for:  Everyone born from [birth year] through 1963.  Anyone with known risk factors for hepatitis C. Sexually transmitted infections (STIs)  You should be screened for sexually transmitted infections (STIs) including gonorrhea and chlamydia if:  You are sexually active and are younger than 59 years of age.  You are older than 59 years of age and your health care provider tells you that you are at risk for this type of infection.  Your sexual activity has changed since you were last screened and you are at an increased risk for chlamydia or gonorrhea. Ask your health care provider if you are at risk.  If you do not have HIV, but are at risk, it may be recommended that you take a prescription medicine daily to prevent HIV infection. This is called pre-exposure prophylaxis (PrEP). You are considered at risk if:  You are sexually active and do not regularly use condoms or know the HIV status of your partner(s).  You take drugs by  injection.  You are sexually active with a partner who has HIV. Talk with your health care provider about whether you are at high risk of being infected with HIV. If you choose to begin PrEP, you should first be tested for HIV. You should then be tested every 3 months for as long as you are taking PrEP. Pregnancy  If you are premenopausal and you may become pregnant, ask your health care provider about preconception counseling.  If  you may become pregnant, take 400 to 800 micrograms (mcg) of folic acid every day.  If you want to prevent pregnancy, talk to your health care provider about birth control (contraception). Osteoporosis and menopause  Osteoporosis is a disease in which the bones lose minerals and strength with aging. This can result in serious bone fractures. Your risk for osteoporosis can be identified using a bone density scan.  If you are 29 years of age or older, or if you are at risk for osteoporosis and fractures, ask your health care provider if you should be screened.  Ask your health care provider whether you should take a calcium or vitamin D supplement to lower your risk for osteoporosis.  Menopause may have certain physical symptoms and risks.  Hormone replacement therapy may reduce some of these symptoms and risks. Talk to your health care provider about whether hormone replacement therapy is right for you. Follow these instructions at home:  Schedule regular health, dental, and eye exams.  Stay current with your immunizations.  Do not use any tobacco products including cigarettes, chewing tobacco, or electronic cigarettes.  If you are pregnant, do not drink alcohol.  If you are breastfeeding, limit how much and how often you drink alcohol.  Limit alcohol intake to no more than 1 drink per day for nonpregnant women. One drink equals 12 ounces of beer, 5 ounces of wine, or 1 ounces of hard liquor.  Do not use street drugs.  Do not share needles.  Ask  your health care provider for help if you need support or information about quitting drugs.  Tell your health care provider if you often feel depressed.  Tell your health care provider if you have ever been abused or do not feel safe at home. This information is not intended to replace advice given to you by your health care provider. Make sure you discuss any questions you have with your health care provider. Document Released: 07/21/2010 Document Revised: 06/13/2015 Document Reviewed: 10/09/2014 Elsevier Interactive Patient Education  2017 [employer]  -   Prediabetes Prediabetes is the condition of having a blood sugar (blood glucose) level that is higher than it should be, but not high enough for you to be diagnosed with type 2 diabetes. Having prediabetes puts you at risk for developing type 2 diabetes (type 2 diabetes mellitus). Prediabetes may be called impaired glucose tolerance or impaired fasting glucose. Prediabetes usually does not cause symptoms. Your health care provider can diagnose this condition with blood tests. You may be tested for prediabetes if you are overweight and if you have at least one other risk factor for prediabetes. Risk factors for prediabetes include:  Having a family member with type 2 diabetes.  Being overweight or obese.  Being older than age 77.  Being of American-Indian, African-American, Hispanic/Latino, or Asian/Pacific Islander descent.  Having an inactive (sedentary) lifestyle.  Having a history of gestational diabetes or polycystic ovarian syndrome (PCOS).  Having low levels of good cholesterol (HDL-C) or high levels of blood fats (triglycerides).  Having high blood pressure. What is blood glucose and how is blood glucose measured?   Blood glucose refers to the amount of glucose in your bloodstream. Glucose comes from eating foods that contain sugars and starches (carbohydrates) that the body breaks down into glucose. Your blood glucose  level may be measured in mg/dL (milligrams per deciliter) or mmol/L (millimoles per liter).Your blood glucose may be checked with one or more of the following blood tests:  A  fasting blood glucose (FBG) test. You will not be allowed to eat (you will fast) for at least 8 hours before a blood sample is taken.  A normal range for FBG is 70-100 mg/dl (3.9-5.6 mmol/L).  An A1c (hemoglobin A1c) blood test. This test provides information about blood glucose control over the previous 2?43month.  An oral glucose tolerance test (OGTT). This test measures your blood glucose twice:  After fasting. This is your baseline level.  Two hours after you drink a beverage that contains glucose. You may be diagnosed with prediabetes:  If your FBG is 100?125 mg/dL (5.6-6.9 mmol/L).  If your A1c level is 5.7?6.4%.  If your OGGT result is 140?199 mg/dL (7.8-11 mmol/L). These blood tests may be repeated to confirm your diagnosis. What happens if blood glucose is too high? The pancreas produces a hormone (insulin) that helps move glucose from the bloodstream into cells. When cells in the body do not respond properly to insulin that the body makes (insulin resistance), excess glucose builds up in the blood instead of going into cells. As a result, high blood glucose (hyperglycemia) can develop, which can cause many complications. This is a symptom of prediabetes. What can happen if blood glucose stays higher than normal for a long time? Having high blood glucose for a long time is dangerous. Too much glucose in your blood can damage your nerves and blood vessels. Long-term damage can lead to complications from diabetes, which may include:  Heart disease.  Stroke.  Blindness.  Kidney disease.  Depression.  Poor circulation in the feet and legs, which could lead to surgical removal (amputation) in severe cases. How can prediabetes be prevented from turning into type 2 diabetes?   To help prevent type 2  diabetes, take the following actions:  Be physically active.  Do moderate-intensity physical activity for at least 30 minutes on at least 5 days of the week, or as much as told by your health care provider. This could be brisk walking, biking, or water aerobics.  Ask your health care provider what activities are safe for you. A mix of physical activities may be best, such as walking, swimming, cycling, and strength training.  Lose weight as told by your health care provider.  Losing 5-7% of your body weight can reverse insulin resistance.  Your health care provider can determine how much weight loss is best for you and can help you lose weight safely.  Follow a healthy meal plan. This includes eating lean proteins, complex carbohydrates, fresh fruits and vegetables, low-fat dairy products, and healthy fats.  Follow instructions from your health care provider about eating or drinking restrictions.  Make an appointment to see a diet and nutrition specialist (registered dietitian) to help you create a healthy eating plan that is right for you.  Do not smoke or use any tobacco products, such as cigarettes, chewing tobacco, and e-cigarettes. If you need help quitting, ask your health care provider.  Take over-the-counter and prescription medicines as told by your health care provider. You may be prescribed medicines that help lower the risk of type 2 diabetes. This information is not intended to replace advice given to you by your health care provider. Make sure you discuss any questions you have with your health care provider. Document Released: 04/29/2015 Document Revised: 06/13/2015 Document Reviewed: 02/26/2015 Elsevier Interactive Patient Education  2017 [employer] Inc[employer]  -  Low-Sodium Eating Plan Sodium, which is an element that makes up salt, helps you maintain a healthy balance of  fluids in your body. Too much sodium can increase your blood pressure and cause fluid and waste to be held  in your body. Your health care provider or dietitian may recommend following this plan if you have high blood pressure (hypertension), kidney disease, liver disease, or heart failure. Eating less sodium can help lower your blood pressure, reduce swelling, and protect your heart, liver, and kidneys. What are tips for following this plan? General guidelines   Most people on this plan should limit their sodium intake to 1,500-2,000 mg (milligrams) of sodium each day. Reading food labels   The Nutrition Facts label lists the amount of sodium in one serving of the food. If you eat more than one serving, you must multiply the listed amount of sodium by the number of servings.  Choose foods with less than 140 mg of sodium per serving.  Avoid foods with 300 mg of sodium or more per serving. Shopping   Look for lower-sodium products, often labeled as "low-sodium" or "no salt added."  Always check the sodium content even if foods are labeled as "unsalted" or "no salt added".  Buy fresh foods.  Avoid canned foods and premade or frozen meals.  Avoid canned, cured, or processed meats  Buy breads that have less than 80 mg of sodium per slice. Cooking   Eat more home-cooked food and less restaurant, buffet, and fast food.  Avoid adding salt when cooking. Use salt-free seasonings or herbs instead of table salt or sea salt. Check with your health care provider or pharmacist before using salt substitutes.  Cook with plant-based oils, such as canola, sunflower, or olive oil. Meal planning   When eating at a restaurant, ask that your food be prepared with less salt or no salt, if possible.  Avoid foods that contain MSG (monosodium glutamate). MSG is sometimes added to [place] food, bouillon, and some canned foods. What foods are recommended? The items listed may not be a complete list. Talk with your dietitian about what dietary choices are best for you. Grains  Low-sodium cereals, including  oats, puffed wheat and rice, and shredded wheat. Low-sodium crackers. Unsalted rice. Unsalted pasta. Low-sodium bread. Whole-grain breads and whole-grain pasta. Vegetables  Fresh or frozen vegetables. "No salt added" canned vegetables. "No salt added" tomato sauce and paste. Low-sodium or reduced-sodium tomato and vegetable juice. Fruits  Fresh, frozen, or canned fruit. Fruit juice. Meats and other protein foods  Fresh or frozen (no salt added) meat, poultry, seafood, and fish. Low-sodium canned tuna and salmon. Unsalted nuts. Dried peas, beans, and lentils without added salt. Unsalted canned beans. Eggs. Unsalted nut butters. Dairy  Milk. Soy milk. Cheese that is naturally low in sodium, such as ricotta cheese, fresh mozzarella, or Swiss cheese Low-sodium or reduced-sodium cheese. Cream cheese. Yogurt. Fats and oils  Unsalted butter. Unsalted margarine with no trans fat. Vegetable oils such as canola or olive oils. Seasonings and other foods  Fresh and dried herbs and spices. Salt-free seasonings. Low-sodium mustard and ketchup. Sodium-free salad dressing. Sodium-free light mayonnaise. Fresh or refrigerated horseradish. Lemon juice. Vinegar. Homemade, reduced-sodium, or low-sodium soups. Unsalted popcorn and pretzels. Low-salt or salt-free chips. What foods are not recommended? The items listed may not be a complete list. Talk with your dietitian about what dietary choices are best for you. Grains  Instant hot cereals. Bread stuffing, pancake, and biscuit mixes. Croutons. Seasoned rice or pasta mixes. Noodle soup cups. Boxed or frozen macaroni and cheese. Regular salted crackers. Self-rising flour. Vegetables  Sauerkraut,  pickled vegetables, and relishes. Olives. [place] fries. Onion rings. Regular canned vegetables (not low-sodium or reduced-sodium). Regular canned tomato sauce and paste (not low-sodium or reduced-sodium). Regular tomato and vegetable juice (not low-sodium or reduced-sodium).  Frozen vegetables in sauces. Meats and other protein foods  Meat or fish that is salted, canned, smoked, spiced, or pickled. Bacon, ham, sausage, hotdogs, corned beef, chipped beef, packaged lunch meats, salt pork, jerky, pickled herring, anchovies, regular canned tuna, sardines, salted nuts. Dairy  Processed cheese and cheese spreads. Cheese curds. Blue cheese. Feta cheese. String cheese. Regular cottage cheese. Buttermilk. Canned milk. Fats and oils  Salted butter. Regular margarine. Ghee. Bacon fat. Seasonings and other foods  Onion salt, garlic salt, seasoned salt, table salt, and sea salt. Canned and packaged gravies. Worcestershire sauce. Tartar sauce. Barbecue sauce. Teriyaki sauce. Soy sauce, including reduced-sodium. Steak sauce. Fish sauce. Oyster sauce. Cocktail sauce. Horseradish that you find on the shelf. Regular ketchup and mustard. Meat flavorings and tenderizers. Bouillon cubes. Hot sauce and Tabasco sauce. Premade or packaged marinades. Premade or packaged taco seasonings. Relishes. Regular salad dressings. Salsa. Potato and tortilla chips. Corn chips and puffs. Salted popcorn and pretzels. Canned or dried soups. Pizza. Frozen entrees and pot pies. Summary  Eating less sodium can help lower your blood pressure, reduce swelling, and protect your heart, liver, and kidneys.  Most people on this plan should limit their sodium intake to 1,500-2,000 mg (milligrams) of sodium each day.  Canned, boxed, and frozen foods are high in sodium. Restaurant foods, fast foods, and pizza are also very high in sodium. You also get sodium by adding salt to food.  Try to cook at home, eat more fresh fruits and vegetables, and eat less fast food, canned, processed, or prepared foods. This information is not intended to replace advice given to you by your health care provider. Make sure you discuss any questions you have with your health care provider. Document Released: 06/27/2001 Document Revised:  12/30/2015 Document Reviewed: 12/30/2015 Elsevier Interactive Patient Education  2017 [employer].

## 2016-06-08 NOTE — Progress Notes (Signed)
[name], is a 61 y.o. female  ZOX:[id number]  WJX:[id number]  DOB - [birth year]  Chief Complaint  Patient presents with  . Diabetes        Subjective:   [name] is a 61 y.o. female here today for a follow up visit, last seen 01/3016, w/ hx of adjustment d/o w/ depressed mood, htn, sz d/o, hx of trichotillomania as well.  Of note, this passed weekend, she was feeling stressed and started pulling her hair out. After the spots got to large, she ended up shaving her head this weekend.  She is doing better now.  She notes she did not take her meds wed - Fri b/c out of town, but now back on her meds.  Feels well/happy overall, denies si/hi/avh.   Patient has No headache, No chest pain, No abdominal pain - No Nausea, No new weakness tingling or numbness, No Cough - SOB.  No problems updated.  ALLERGIES: Allergies  Allergen Reactions  . Codeine Anaphylaxis  . Shellfish Allergy Swelling and Rash    Throat Sweling  . Banana Rash    Cannot eat any while taking KEPPRA  . Catfish [Fish Allergy] Rash  . Orange Fruit [Citrus] Rash    Cannot have while taking KEPPRA  . Tomato Itching and Rash    PAST MEDICAL HISTORY: Past Medical History:  Diagnosis Date  . Carpal tunnel syndrome   . Diabetes mellitus without complication (HCC)   . DYSTONIA 01/04/2007   Qualifier: Diagnosis of  By: Humberto Seals NP, Darl Pikes    . Facial droop 10/09/2013  . Hemorrhoid   . Hypertension   . Major depressive disorder, recurrent episode (HCC) 03/18/2006   Qualifier: Diagnosis of  By: Bradly Bienenstock    . Obesity, unspecified 10/17/2007   Qualifier: Diagnosis of  By: Gerilyn Pilgrim PHD, Jeannie    . Prediabetes 10/09/2013  . Rectal bleeding 07/03/2015  . Seizures (HCC)   . Seizures (HCC) 08/24/2013    MEDICATIONS AT HOME: Prior to Admission medications   Medication Sig Start Date End Date Taking? Authorizing Provider  acetaminophen (TYLENOL) 500 MG tablet Take 500 mg by mouth every 6 (six) hours as  needed.    [provider]  amLODipine (NORVASC) 10 MG tablet Take 1 tablet (10 mg total) by mouth daily. 06/08/16   Pete Glatter, MD  amoxicillin (AMOXIL) 500 MG capsule Take 1 capsule (500 mg total) by mouth 3 (three) times daily. Patient not taking: Reported on 02/18/2016 01/09/16   Anders Simmonds, PA-C  fluticasone [facility]) 50 MCG/ACT nasal spray Place 2 sprays into both nostrils daily. 06/08/16   Pete Glatter, MD  levETIRAcetam (KEPPRA XR) 500 MG 24 hr tablet Take 2 tablets (1,000 mg total) by mouth 2 (two) times daily. 06/08/16   Pete Glatter, MD  lisinopril-hydrochlorothiazide (ZESTORETIC) 20-12.5 MG tablet Take 2 tablets by mouth daily. 06/08/16   Pete Glatter, MD  loratadine (CLARITIN) 10 MG tablet Take 1 tablet (10 mg total) by mouth daily. 06/08/16   Pete Glatter, MD  metFORMIN (GLUCOPHAGE) 500 MG tablet Take 1 tablet (500 mg total) by mouth daily with breakfast. 06/08/16   Dierdre Searles T, MD  mirtazapine (REMERON) 15 MG tablet Take 1 tablet (15 mg total) by mouth at bedtime. 03/02/16   Neysa Hotter, MD     Objective:   Vitals:   06/08/16 0851  BP: 129/85  Pulse: 82  Resp: 16  Temp: 98.4 F (36.9 C)  TempSrc: Oral  SpO2:  95%  Weight: 208 lb 3.2 oz (94.4 kg)    Exam General appearance : Awake, alert, not in any distress. Speech Clear. Not toxic looking, pleasant, obese. HEENT: Atraumatic and Normocephalic, pupils equally reactive to light. Neck: supple, no JVD.  Chest:Good air entry bilaterally, no added sounds. CVS: S1 S2 regular, no murmurs/gallups or rubs. Abdomen: Bowel sounds active, obese, Non tender and not distended with no gaurding, rigidity or rebound. Extremities: B/L Lower Ext shows no edema, both legs are warm to touch Neurology: Awake alert, and oriented X 3, CN II-XII grossly intact, Non focal Skin:No Rash  Data Review Lab Results  Component Value Date   HGBA1C 5.8 10/15/2015   HGBA1C 6.1 05/29/2015   HGBA1C 6.0  02/20/2015    Depression screen [facility] 2/9 01/09/2016 10/15/2015 05/29/2015 02/20/2015  Decreased Interest 0 (No Data) 3 0  Down, Depressed, Hopeless 0 3 3 3   PHQ - 2 Score 0 3 6 3   Altered sleeping 0 (No Data) 3 0  Tired, decreased energy 0 (No Data) 3 3  Change in appetite 0 (No Data) 3 0  Feeling bad or failure about yourself  0 (No Data) 3 3  Trouble concentrating 0 (No Data) 3 3  Moving slowly or fidgety/restless 0 (No Data) 3 3  Suicidal thoughts 0 (No Data) 3 0  PHQ-9 Score 0 - 27 15  Difficult doing work/chores - - Extremely dIfficult -      Assessment & Plan   1. HYPERTENSION, BENIGN SYSTEMIC Well controlled, continue low salt diet Renewed norvasc 10 and lisinopril 20-12.5 x 2 tabs daily  2. Prediabetes - doing well w/ diet and metformin 500 qd, renewed - POCT glucose (manual entry) - Ambulatory referral to Ophthalmology - - metFORMIN (GLUCOPHAGE) 500 MG tablet; Take 1 tablet (500 mg total) by mouth daily with breakfast.  Dispense: 90 tablet; Refill: 3  3. Trichotillomania dw pt other options when she feels the urge to pull her hair. Ie, [facility] and she can pull out the doll's hair instead.  4. Adjustment disorder with mixed anxiety and depressed mood Doing well, seeing therapist and psychiatry. Denies si/hi/avh  5. Seasonal allergic rhinitis, unspecified trigger Doing well w/ claritin and flonase  6. Insomnia  - doing well w/ remeron per Psyche.  7. Seizure d/o  on keppra 1000bid long term.     Patient have been counseled extensively about nutrition and exercise  Return in about 3 months (around 09/08/2016), or if symptoms worsen or fail to improve.  The patient was given clear instructions to go to ER or return to medical center if symptoms don't improve, worsen or new problems develop. The patient verbalized understanding. The patient was told to call to get lab results if they haven't heard anything in the next week.   This note  has been created with Education officer, environmentalDragon speech recognition software and smart phrase technology. Any transcriptional errors are unintentional.   Pete Glatterawn T Emanuell Morina, MD, MBA/MHA [facility] [place], [place] [phone]   06/08/2016, 9:07 AM

## 2016-06-23 ENCOUNTER — Ambulatory Visit (INDEPENDENT_AMBULATORY_CARE_PROVIDER_SITE_OTHER): Payer: Medicaid Other | Admitting: Gastroenterology

## 2016-06-23 ENCOUNTER — Encounter: Payer: Self-pay | Admitting: Gastroenterology

## 2016-06-23 VITALS — BP 137/76 | HR 87 | Temp 97.2°F | Ht 62.0 in | Wt 207.8 lb

## 2016-06-23 DIAGNOSIS — K625 Hemorrhage of anus and rectum: Secondary | ICD-10-CM | POA: Diagnosis not present

## 2016-06-23 NOTE — Assessment & Plan Note (Signed)
61 year old female with history of internal hemorrhoids. Doing well without any overt GI bleeding and no other concerns. Will have her return in 1 year or sooner as needed.

## 2016-06-23 NOTE — Patient Instructions (Signed)
I am so glad you are doing well!  I will see you in 1 year or sooner if needed.  Please call if you have any concerns!

## 2016-06-23 NOTE — Progress Notes (Signed)
Referring Provider: Pete Glatter, MD Primary Care Physician:  Pete Glatter, MD Primary GI: Dr. Darrick Penna   Chief Complaint  Patient presents with  . Rectal Bleeding    f/u, doing ok    HPI:   [name] is a 61 y.o. female presenting today with a history of rectal bleeding with colonoscopy last summer noting small-mouth diverticula, small non-bleeding IH, redundant colon. Here for routine follow-up.   Enjoys soups and salads. States this works well for her stomach. No rectal bleeding. No abdominal pain. BM daily.   Past Medical History:  Diagnosis Date  . Carpal tunnel syndrome   . Diabetes mellitus without complication (HCC)   . DYSTONIA 01/04/2007   Qualifier: Diagnosis of  By: Humberto Seals NP, Darl Pikes    . Facial droop 10/09/2013  . Hemorrhoid   . Hypertension   . Major depressive disorder, recurrent episode (HCC) 03/18/2006   Qualifier: Diagnosis of  By: Bradly Bienenstock    . Obesity, unspecified 10/17/2007   Qualifier: Diagnosis of  By: Gerilyn Pilgrim PHD, Jeannie    . Prediabetes 10/09/2013  . Rectal bleeding 07/03/2015  . Seizures (HCC)   . Seizures (HCC) 08/24/2013    Past Surgical History:  Procedure Laterality Date  . ABDOMINAL HYSTERECTOMY    . COLONOSCOPY N/A 07/15/2015   Dr. Darrick Penna: small-mouth diverticula, small non-bleeding internal hemorrhoids, redundant colon   . HERNIA REPAIR    . MYOMECTOMY      Current Outpatient Prescriptions  Medication Sig Dispense Refill  . amLODipine (NORVASC) 10 MG tablet Take 1 tablet (10 mg total) by mouth daily. 90 tablet 3  . Calcium 500 MG tablet Take 500 mg by mouth daily.    . cholecalciferol (VITAMIN D) 1000 units tablet Take 1,000 Units by mouth daily.    . fluticasone (FLONASE) 50 MCG/ACT nasal spray Place 2 sprays into both nostrils daily. 16 g 6  . levETIRAcetam (KEPPRA XR) 500 MG 24 hr tablet Take 2 tablets (1,000 mg total) by mouth 2 (two) times daily. 300 tablet 3  . lisinopril-hydrochlorothiazide (ZESTORETIC) 20-12.5  MG tablet Take 2 tablets by mouth daily. 180 tablet 3  . loratadine (CLARITIN) 10 MG tablet Take 1 tablet (10 mg total) by mouth daily. 30 tablet 11  . loratadine (CLARITIN) 10 MG tablet TAKE 1 TABLET BY MOUTH DAILY. 30 tablet 2  . metFORMIN (GLUCOPHAGE) 500 MG tablet Take 1 tablet (500 mg total) by mouth daily with breakfast. 90 tablet 3  . mirtazapine (REMERON) 15 MG tablet Take 1 tablet (15 mg total) by mouth at bedtime. 30 tablet 2  . acetaminophen (TYLENOL) 500 MG tablet Take 500 mg by mouth every 6 (six) hours as needed.    [name] amoxicillin (AMOXIL) 500 MG capsule Take 1 capsule (500 mg total) by mouth 3 (three) times daily. (Patient not taking: Reported on 02/18/2016) 30 capsule 0   No current facility-administered medications for this visit.     Allergies as of 06/23/2016 - Review Complete 06/23/2016  Allergen Reaction Noted  . Codeine Anaphylaxis 08/24/2013  . Shellfish allergy Swelling and Rash 10/05/2013  . Opium  06/23/2016  . Banana Rash 10/05/2013  . Catfish [fish allergy] Rash 06/13/2014  . Orange fruit [citrus] Rash 10/05/2013  . Tomato Itching and Rash 08/24/2013    Family History  Problem Relation Age of Onset  . Asthma Mother   . Hypertension Father   . Heart disease Father   . Stroke Father   . Cancer Sister  uterus cancer   . Diabetes Maternal Uncle   . Breast cancer Cousin   . Colon cancer Neg Hx     Social History   Social History  . Marital status: Divorced    Spouse name: N/A  . Number of children: 1  . Years of education: N/A   Social History Main Topics  . Smoking status: Never Smoker  . Smokeless tobacco: Never Used  . Alcohol use No  . Drug use: No  . Sexual activity: Not Asked   Other Topics Concern  . None   Social History Narrative  . None    Review of Systems: As mentioned in HPI   Physical Exam: BP 137/76   Pulse 87   Temp 97.2 F (36.2 C) (Oral)   Ht 5\' 2"  (1.575 m)   Wt 207 lb 12.8 oz (94.3 kg)   BMI 38.01 kg/m    General:   Alert and oriented. No distress noted. Pleasant and cooperative.  Head:  Normocephalic and atraumatic. Eyes:  Conjuctiva clear without scleral icterus. Mouth:  Oral mucosa pink and moist. Good dentition. No lesions. Abdomen:  +BS, soft, non-tender and non-distended. No rebound or guarding. No HSM or masses noted. Msk:  Symmetrical without gross deformities. Normal posture. Extremities:  Without edema. Neurologic:  Alert and  oriented x4;  grossly normal neurologically. Psych:  Alert and cooperative. Normal mood and affect.

## 2016-06-24 ENCOUNTER — Ambulatory Visit (HOSPITAL_COMMUNITY): Payer: Self-pay | Admitting: Psychiatry

## 2016-06-25 ENCOUNTER — Encounter (HOSPITAL_COMMUNITY): Payer: Self-pay | Admitting: Licensed Clinical Social Worker

## 2016-06-25 ENCOUNTER — Ambulatory Visit (INDEPENDENT_AMBULATORY_CARE_PROVIDER_SITE_OTHER): Payer: Medicaid Other | Admitting: Licensed Clinical Social Worker

## 2016-06-25 DIAGNOSIS — F331 Major depressive disorder, recurrent, moderate: Secondary | ICD-10-CM

## 2016-06-25 DIAGNOSIS — F4323 Adjustment disorder with mixed anxiety and depressed mood: Secondary | ICD-10-CM

## 2016-06-25 NOTE — Progress Notes (Signed)
THERAPIST PROGRESS NOTE  Session Time: 10:20-10:50am  Participation Level: Active  Behavioral Response: CasualAlert/anxious  Type of Therapy: Individual Therapy  Treatment Goals addressed: Coping  Interventions: Solution Focused  Summary: Kirsten RampRenee [name] is a 61 y.o. female.  Pt discussed her psychiatric symptoms and current life events. Pt has an upcoming appt with Dr. Gaspar SkeetersEskir, new psychiatrist. Pt has been making plans for her future. [place] and [place]. She has asked her sister to be custodian of her $. She wants to [place] own place in [place]; she wants to be independent. Pt reports she hasn't had a seizure since January. Pt wants her driver's licence but cannot get them until she has not had a seizure in 6 mos. Pt is still finding it difficult to use boundaries with her family members who have approached her about wanting $. Pt is till struggling to find a church home. Investigated churches that meet on Saturday for pt and processed each church with her. Taught pt how to use guided meditation to relax. Had pt listen to one while in session. Pt was surprised how it calmed her. Pt was open and willing during the intervention.       Suicidal/Homicidal: Nowithout intent/plan  Therapist Response: Assessed pt's current functioning and reviewed progress. Assisted pt with her life plan", boundaries and meditation. Assisted pt in processing her management of stressors.  Plan: Return again in 2 weeks and will continue to see pt until she moves out of state. Pt will see psyhciatrist within a week.  Diagnosis:      Axis I: Major Depressive Disorder, recurrent episode moderate                              Ceylon Arenson S, LCAS 06/25/16

## 2016-06-25 NOTE — Progress Notes (Signed)
CC'ED TO PCP 

## 2016-06-29 ENCOUNTER — Encounter (HOSPITAL_COMMUNITY): Payer: Self-pay | Admitting: Licensed Clinical Social Worker

## 2016-06-30 ENCOUNTER — Ambulatory Visit (INDEPENDENT_AMBULATORY_CARE_PROVIDER_SITE_OTHER): Payer: Medicaid Other | Admitting: Psychiatry

## 2016-06-30 ENCOUNTER — Encounter (HOSPITAL_COMMUNITY): Payer: Self-pay | Admitting: Psychiatry

## 2016-06-30 VITALS — BP 130/80 | HR 75 | Ht 62.0 in | Wt 210.0 lb

## 2016-06-30 DIAGNOSIS — F331 Major depressive disorder, recurrent, moderate: Secondary | ICD-10-CM | POA: Diagnosis not present

## 2016-06-30 DIAGNOSIS — F5101 Primary insomnia: Secondary | ICD-10-CM

## 2016-06-30 MED ORDER — MIRTAZAPINE 30 MG PO TABS: 30.0000 mg | ORAL_TABLET | Freq: Every day | ORAL | 1 refills | Status: DC

## 2016-06-30 MED FILL — MIRTAZAPINE 30 MG TABLET: 30 | 30 days supply | Qty: 30 | Fill #0

## 2016-06-30 MED FILL — LISINOPRIL-HCTZ 20-12.5 MG: 20-12.5 | 30 days supply | Qty: 60 | Fill #6

## 2016-06-30 MED FILL — AMLODIPINE BESYLATE 10 MG T: 10 | 30 days supply | Qty: 30 | Fill #4

## 2016-06-30 NOTE — Progress Notes (Signed)
]      ROS ] 

## 2016-06-30 NOTE — Progress Notes (Signed)
BH MD/PA/NP OP Progress Note  06/30/2016 11:37 AM [name]  MRN:  [id number]  Chief Complaint:  Subjective:  Kirsten Rampenee Lukins presents today for a transfer of care. She was previously seen by Dr. Vanetta ShawlHisada at this clinic, last seen in 02/2016.  She was initiated on Remeron for depression, and [facility].  I spent time discussing her current living situation with her. [place]  They provide lunch every day, and they have assistance services. She lives alone, has good support from her family.  She reports that things are going well with her Remeron, she wonders if she would benefit from an increase to 30 mg. She feels like the 15 mg dose has worn down the little bit. She reports that she sleeps well at night. She denies any unsafe thoughts. She is going to be establishing care with a new primary care physician.   Agrees to RTC in 3 months.    Visit Diagnosis:    ICD-10-CM   1. Major depressive disorder, recurrent episode, moderate (HCC) F33.1 mirtazapine (REMERON) 30 MG tablet  2. Primary insomnia F51.01 mirtazapine (REMERON) 30 MG tablet   Past Psychiatric History: See intake H&P for full details. Reviewed, with no updates at this time.  Past Medical History:  Past Medical History:  Diagnosis Date  . Anxiety   . Carpal tunnel syndrome   . Diabetes mellitus without complication (HCC)   . Diabetes mellitus, type II (HCC)   . DYSTONIA 01/04/2007   Qualifier: Diagnosis of  By: Humberto SealsSaxon NP, Darl PikesSusan    . Facial droop 10/09/2013  . Hemorrhoid   . Hypertension   . Major depressive disorder, recurrent episode (HCC) 03/18/2006   Qualifier: Diagnosis of  By: Bradly BienenstockHarrelson, Kathy    . Obesity, unspecified 10/17/2007   Qualifier: Diagnosis of  By: Gerilyn PilgrimSykes PHD, Jeannie    . Prediabetes 10/09/2013  . Rectal bleeding 07/03/2015  . Seizures (HCC)   . Seizures (HCC) 08/24/2013    Past Surgical History:  Procedure  Laterality Date  . ABDOMINAL HYSTERECTOMY    . COLONOSCOPY N/A 07/15/2015   Dr. Darrick PennaFields: small-mouth diverticula, small non-bleeding internal hemorrhoids, redundant colon   . HERNIA REPAIR    . MYOMECTOMY      Family Psychiatric History: See intake H&P for full details. Reviewed, with no updates at this time.  Family History:  Family History  Problem Relation Age of Onset  . Asthma Mother   . Hypertension Father   . Heart disease Father   . Stroke Father   . Cancer Sister        uterus cancer   . Diabetes Maternal Uncle   . Breast cancer Cousin   . Colon cancer Neg Hx     Social History:  Social History   Social History  . Marital status: Divorced    Spouse name: N/A  . Number of children: 1  . Years of education: N/A   Social History Main Topics  . Smoking status: Never Smoker  . Smokeless tobacco: Never Used  . Alcohol use No  . Drug use: No  . Sexual activity: No   Other Topics Concern  . None   Social History Narrative  . None    Allergies:  Allergies  Allergen Reactions  . Codeine Anaphylaxis  . Shellfish Allergy Swelling and Rash    Throat Sweling  . Opium     Interferes with Keppra  .  Banana Rash    Cannot eat any while taking KEPPRA  . Catfish [Fish Allergy] Rash  . Orange Fruit [Citrus] Rash    Cannot have while taking KEPPRA  . Tomato Itching and Rash    Metabolic Disorder Labs: Lab Results  Component Value Date   HGBA1C 5.8 10/15/2015   No results found for: PROLACTIN Lab Results  Component Value Date   CHOL 98 02/18/2016   TRIG 124 02/18/2016   HDL 43 (L) 02/18/2016   CHOLHDL 2.3 02/18/2016   VLDL 25 02/18/2016   LDLCALC 30 02/18/2016   LDLCALC 58 02/20/2015     Current Medications: Current Outpatient Prescriptions  Medication Sig Dispense Refill  . amLODipine (NORVASC) 10 MG tablet Take 1 tablet (10 mg total) by mouth daily. 90 tablet 3  . Cholecalciferol (VITAMIN D3) 3000 units TABS Take by mouth.    . fluticasone  (FLONASE) 50 MCG/ACT nasal spray Place 2 sprays into both nostrils daily. 16 g 6  . levETIRAcetam (KEPPRA XR) 500 MG 24 hr tablet Take 2 tablets (1,000 mg total) by mouth 2 (two) times daily. 300 tablet 3  . lisinopril-hydrochlorothiazide (ZESTORETIC) 20-12.5 MG tablet Take 2 tablets by mouth daily. 180 tablet 3  . loratadine (CLARITIN) 10 MG tablet TAKE 1 TABLET BY MOUTH DAILY. 30 tablet 2  . metFORMIN (GLUCOPHAGE) 500 MG tablet Take 1 tablet (500 mg total) by mouth daily with breakfast. 90 tablet 3  . mirtazapine (REMERON) 30 MG tablet Take 1 tablet (30 mg total) by mouth at bedtime. 90 tablet 1   No current facility-administered medications for this visit.     Neurologic: Headache: Negative Seizure: Negative Paresthesias: Negative  Musculoskeletal: Strength & Muscle Tone: within normal limits Gait & Station: shuffle Patient leans: N/A Walks with an assisted device (a stick that appears to be the end of a painting pole)  Psychiatric Specialty Exam: ROS  Blood pressure 130/80, pulse 75, height 5\' 2"  (1.575 m), weight 210 lb (95.3 kg).Body mass index is 38.41 kg/m.  General Appearance: Casual and Fairly Groomed  Eye Contact:  Good  Speech:  Clear and Coherent and Normal Rate  Volume:  Normal  Mood:  Euthymic  Affect:  Congruent  Thought Process:  Coherent  Orientation:  Full (Time, Place, and Person)  Thought Content: Logical   Suicidal Thoughts:  No  Homicidal Thoughts:  No  Memory:  Immediate;   Fair  Judgement:  Intact  Insight:  Fair  Psychomotor Activity:  Normal  Concentration:  Concentration: Fair  Recall:  [employer] of Knowledge: Fair  Language: Fair  Akathisia:  Negative  Handed:  Right  AIMS (if indicated):  0  Assets:  Communication Skills Desire for Improvement Financial Resources/Insurance Housing  ADL's:  Intact  Cognition: WNL  Sleep:  6-7 hours   Treatment Plan Summary: Sintia Mckissic is a 61 year old female with multiple medical problems  including epilepsy, psoriasis, neuropathy, and a history of major depressive disorder who presents today for psychiatric transfer of care.  She presents as fairly unusual during our interaction, and seems to discuss mostly very superficial topics.  Though psychosis or mania.  She seems to be doing well with Remeron 15 mg, but would likely benefit from an increase to 30 mg given that she continues to struggle with some periods of anxiety and dysphoria throughout the day.  Acute safety issues, and she has consistent follow-up with [facility] for therapy.  We will follow-up in 3 months.  1. Major depressive disorder, recurrent  episode, moderate (HCC)   2. Primary insomnia    Continue therapy with [facility] Increase Remeron to 30 mg nightly Follow-up in 3 months  Burnard Leigh, MD 06/30/2016, 11:37 AM

## 2016-07-03 MED FILL — FLUTICASONE PROP 50 MCG SPR: 50 | 30 days supply | Qty: 16 | Fill #5

## 2016-07-03 MED FILL — LORATADINE 10 MG TABLET: 10 | 30 days supply | Qty: 30 | Fill #1

## 2016-07-03 MED FILL — LEVETIRACETAM ER 500 MG TAB: 500 | 30 days supply | Qty: 120 | Fill #4

## 2016-07-03 MED FILL — metFORMIN HCL 500 MG TABS: 500 | 30 days supply | Qty: 30 | Fill #3

## 2016-07-16 ENCOUNTER — Ambulatory Visit (INDEPENDENT_AMBULATORY_CARE_PROVIDER_SITE_OTHER): Payer: Medicaid Other | Admitting: Licensed Clinical Social Worker

## 2016-07-16 DIAGNOSIS — F331 Major depressive disorder, recurrent, moderate: Secondary | ICD-10-CM

## 2016-07-27 ENCOUNTER — Encounter (HOSPITAL_COMMUNITY): Payer: Self-pay | Admitting: Licensed Clinical Social Worker

## 2016-07-27 NOTE — Progress Notes (Signed)
THERAPIST PROGRESS NOTE  Session Time: 10:20-10:50am  Participation Level: Active  Behavioral Response: CasualAlert/anxious  Type of Therapy: Individual Therapy  Treatment Goals addressed: Coping  Interventions: Solution Focused  Summary: Ronnald RampRenee Naves is a 61 y.o. female who presents for her individual counseling session.Pt discussed her psychiatric symptoms and current life events. Pt saw Dr. Gaspar SkeetersEskir, psychiatrist, for medication management. Pt discussed her major stressor for her seizures is stress. Pt and clinician discussed healthy coping skills for stress. Pt was open to the explanation of purpose process and practice of mindfulness techniques. Taught pt grounding technique [phone] which pt practiced in session. Pt listened to meditation in session and was taught how to do this at home. Pt was receptive to learning new techniques for managing her stress..       Suicidal/Homicidal: Nowithout intent/plan  Therapist Response: Assessed pt's current functioning and reviewed progress. Assisted pt with stressors, mindfulness and meditation. Assisted pt in processing her management of stressors.  Plan: Return again in 2 weeks.  Diagnosis:      Axis I: Major Depressive Disorder, recurrent episode moderate                              Corissa Oguinn S, LCAS 07/16/16

## 2016-08-05 ENCOUNTER — Other Ambulatory Visit: Payer: Self-pay | Admitting: Physician Assistant

## 2016-08-05 DIAGNOSIS — J0120 Acute ethmoidal sinusitis, unspecified: Secondary | ICD-10-CM

## 2016-08-05 MED FILL — LISINOPRIL-HCTZ 20-12.5 MG: 20-12.5 | 30 days supply | Qty: 60 | Fill #7

## 2016-08-05 MED FILL — MIRTAZAPINE 30 MG TABLET: 30 | 30 days supply | Qty: 30 | Fill #1

## 2016-08-05 MED FILL — AMLODIPINE BESYLATE 10 MG T: 10 | 30 days supply | Qty: 30 | Fill #5

## 2016-08-05 MED FILL — LEVETIRACETAM ER 500 MG TAB: 500 | 15 days supply | Qty: 60 | Fill #5

## 2016-08-05 MED FILL — metFORMIN HCL 500 MG TABS: 500 | 30 days supply | Qty: 30 | Fill #4

## 2016-08-05 MED FILL — FLUTICASONE PROP 50 MCG SPR: 50 | 30 days supply | Qty: 16 | Fill #6

## 2016-08-05 MED FILL — LORATADINE 10 MG TABLET: 10 | 30 days supply | Qty: 30 | Fill #2

## 2016-08-18 ENCOUNTER — Ambulatory Visit (HOSPITAL_COMMUNITY): Payer: Self-pay | Admitting: Licensed Clinical Social Worker

## 2016-08-31 ENCOUNTER — Encounter (HOSPITAL_COMMUNITY): Payer: Self-pay | Admitting: Licensed Clinical Social Worker

## 2016-08-31 ENCOUNTER — Ambulatory Visit (INDEPENDENT_AMBULATORY_CARE_PROVIDER_SITE_OTHER): Payer: Medicaid Other | Admitting: Licensed Clinical Social Worker

## 2016-08-31 DIAGNOSIS — F331 Major depressive disorder, recurrent, moderate: Secondary | ICD-10-CM | POA: Diagnosis not present

## 2016-08-31 NOTE — Progress Notes (Signed)
THERAPIST PROGRESS NOTE  Session Time: 10:10-11am  Participation Level: Active  Behavioral Response: CasualAlert/anxious  Type of Therapy: Individual Therapy  Treatment Goals addressed: Coping  Interventions: CBT  Summary: [name] is a 61 y.o. female who presents for her individual counseling session.Pt discussed her psychiatric symptoms and current life events. Pt discussed her current stressor: her daughter. She shared she is spoiled and unappreciative. Pt is concerned about the welfare of her granddaughter. Gave pt information on how to contact DSS. Pt reports, "my daughter has a dark side." Pt has supportive siblings who assist pt in everyday life decisions.  Pt and clinician discussed healthy coping skills for stress. Again stressed the importance and explanation of purpose process and practice of mindfulness techniques. Taught pt grounding technique (mindfulness of the breath) which pt practiced in session.  Pt was receptive to learning new techniques for managing her stress..       Suicidal/Homicidal: Nowithout intent/plan  Therapist Response: Assessed pt's current functioning and reviewed progress. Assisted pt with stressors, mindfulness. Assisted pt in processing her management of stressors.  Plan: Return again in 1 month.  Diagnosis:      Axis I: Major Depressive Disorder, recurrent episode moderate                              Ayssa Bentivegna S, LCAS 08/31/16

## 2016-09-02 MED FILL — AMLODIPINE BESYLATE 10 MG T: 10 | 30 days supply | Qty: 30 | Fill #6

## 2016-09-02 MED FILL — FLUTICASONE PROP 50 MCG SPR: 50 | 30 days supply | Qty: 16 | Fill #0

## 2016-09-02 MED FILL — LEVETIRACETAM ER 500 MG TAB: 500 | 30 days supply | Qty: 120 | Fill #0

## 2016-09-02 MED FILL — metFORMIN HCL 500 MG TABS: 500 | 30 days supply | Qty: 30 | Fill #5

## 2016-09-02 MED FILL — MIRTAZAPINE 30 MG TABLET: 30 | 30 days supply | Qty: 30 | Fill #2

## 2016-09-02 MED FILL — LISINOPRIL-HCTZ 20-12.5 MG: 20-12.5 | 30 days supply | Qty: 60 | Fill #8

## 2016-09-04 NOTE — Progress Notes (Signed)
REVIEWED-NO ADDITIONAL RECOMMENDATIONS. 

## 2016-09-14 ENCOUNTER — Ambulatory Visit (HOSPITAL_COMMUNITY): Payer: Self-pay | Admitting: Licensed Clinical Social Worker

## 2016-09-16 ENCOUNTER — Encounter: Payer: Self-pay | Admitting: Internal Medicine

## 2016-09-16 DIAGNOSIS — K648 Other hemorrhoids: Secondary | ICD-10-CM | POA: Insufficient documentation

## 2016-09-17 ENCOUNTER — Ambulatory Visit: Payer: Medicaid Other | Attending: Internal Medicine | Admitting: Internal Medicine

## 2016-09-17 ENCOUNTER — Encounter: Payer: Self-pay | Admitting: Internal Medicine

## 2016-09-17 VITALS — BP 112/76 | HR 77 | Temp 98.4°F | Resp 16 | Wt 206.8 lb

## 2016-09-17 DIAGNOSIS — Z825 Family history of asthma and other chronic lower respiratory diseases: Secondary | ICD-10-CM | POA: Diagnosis not present

## 2016-09-17 DIAGNOSIS — F5101 Primary insomnia: Secondary | ICD-10-CM | POA: Insufficient documentation

## 2016-09-17 DIAGNOSIS — I10 Essential (primary) hypertension: Secondary | ICD-10-CM | POA: Diagnosis not present

## 2016-09-17 DIAGNOSIS — Z90710 Acquired absence of both cervix and uterus: Secondary | ICD-10-CM | POA: Insufficient documentation

## 2016-09-17 DIAGNOSIS — Z823 Family history of stroke: Secondary | ICD-10-CM | POA: Diagnosis not present

## 2016-09-17 DIAGNOSIS — F4323 Adjustment disorder with mixed anxiety and depressed mood: Secondary | ICD-10-CM

## 2016-09-17 DIAGNOSIS — Z91013 Allergy to seafood: Secondary | ICD-10-CM | POA: Diagnosis not present

## 2016-09-17 DIAGNOSIS — Z885 Allergy status to narcotic agent status: Secondary | ICD-10-CM | POA: Insufficient documentation

## 2016-09-17 DIAGNOSIS — E669 Obesity, unspecified: Secondary | ICD-10-CM | POA: Diagnosis not present

## 2016-09-17 DIAGNOSIS — Z7984 Long term (current) use of oral hypoglycemic drugs: Secondary | ICD-10-CM | POA: Diagnosis not present

## 2016-09-17 DIAGNOSIS — M2141 Flat foot [pes planus] (acquired), right foot: Secondary | ICD-10-CM | POA: Diagnosis not present

## 2016-09-17 DIAGNOSIS — Z9889 Other specified postprocedural states: Secondary | ICD-10-CM | POA: Insufficient documentation

## 2016-09-17 DIAGNOSIS — Z833 Family history of diabetes mellitus: Secondary | ICD-10-CM | POA: Diagnosis not present

## 2016-09-17 DIAGNOSIS — E119 Type 2 diabetes mellitus without complications: Secondary | ICD-10-CM | POA: Diagnosis present

## 2016-09-17 DIAGNOSIS — Z803 Family history of malignant neoplasm of breast: Secondary | ICD-10-CM | POA: Diagnosis not present

## 2016-09-17 DIAGNOSIS — Z23 Encounter for immunization: Secondary | ICD-10-CM | POA: Diagnosis not present

## 2016-09-17 DIAGNOSIS — R569 Unspecified convulsions: Secondary | ICD-10-CM | POA: Diagnosis not present

## 2016-09-17 DIAGNOSIS — Z91018 Allergy to other foods: Secondary | ICD-10-CM | POA: Insufficient documentation

## 2016-09-17 DIAGNOSIS — R29810 Facial weakness: Secondary | ICD-10-CM | POA: Insufficient documentation

## 2016-09-17 DIAGNOSIS — Z8049 Family history of malignant neoplasm of other genital organs: Secondary | ICD-10-CM | POA: Insufficient documentation

## 2016-09-17 DIAGNOSIS — R7303 Prediabetes: Secondary | ICD-10-CM | POA: Diagnosis not present

## 2016-09-17 DIAGNOSIS — Z6837 Body mass index (BMI) 37.0-37.9, adult: Secondary | ICD-10-CM | POA: Insufficient documentation

## 2016-09-17 LAB — POCT GLYCOSYLATED HEMOGLOBIN (HGB A1C): HEMOGLOBIN A1C: 5.7

## 2016-09-17 LAB — GLUCOSE, POCT (MANUAL RESULT ENTRY): POC GLUCOSE: 111 mg/dL — AB (ref 70–99)

## 2016-09-17 NOTE — Patient Instructions (Signed)

## 2016-09-17 NOTE — Progress Notes (Signed)
Patient ID: [name], female    DOB: 10-07-1955  MRN: [id number]  CC: re-establish and Diabetes   Subjective: [name] is a 61 y.o. female who presents for chronic ds management. Last seen 05/2016. Her concerns today include:  Pt with hx of depression, HTN, Sz ds, pre-DM, trichotillomania, internal hemorrhoids, seasonal allergies  1. Depression/anxiety: followed by psychiatrist and a therapist. Doing okay on Remeron -have a doll. Uses that when she has urge to pull out her hair. Hair is growing back  2.  SZ ds:  -followed by Dr. Robley Fries with [facility]. Last seen in January 2018 -reports sz episode last mth and another beginning of this mth that last 47 mins. Did not go to ER. She was alone.  Reports shaking episodes where she falls down. "I have to ball up in a fetal position and ride it out."  3. HTN:  -compliant with meds and salt restriction -no CP/LE edema  4. Pre-DM: Taking Metformin -thinks she has DM Has tingling and numbness of RT foot and CTS RT hand -Eating habits: no pork. Likes bake chicken and veggies -Exercise: walks for 30 mins 5 days a wk -plans to schedule eye exam. Was given a list of providers in the area by Soc. Sec office that take Medicaid  Patient Active Problem List   Diagnosis Date Noted  . Internal hemorrhoid 09/16/2016  . Adjustment disorder with mixed anxiety and depressed mood 12/04/2015  . Primary insomnia 12/04/2015  . Prediabetes 10/09/2013  . Facial droop 10/09/2013  . Seizures (HCC) 08/24/2013  . OBESITY, UNSPECIFIED 10/17/2007  . 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 PES PLANUS, RIGHT 07/20/2007  . DYSTONIA 01/04/2007  . Major depressive disorder, recurrent episode (HCC) 03/18/2006  . HYPERTENSION, BENIGN SYSTEMIC 03/18/2006  . PSORIASIS 03/18/2006     Current Outpatient Prescriptions on File Prior to Visit  Medication Sig Dispense Refill  . amLODipine (NORVASC) 10 MG tablet Take 1 tablet (10 mg total) by  mouth daily. 90 tablet 3  . Cholecalciferol (VITAMIN D3) 3000 units TABS Take by mouth.    . fluticasone (FLONASE) 50 MCG/ACT nasal spray Place 2 sprays into both nostrils daily. 16 g 6  . levETIRAcetam (KEPPRA XR) 500 MG 24 hr tablet Take 2 tablets (1,000 mg total) by mouth 2 (two) times daily. 300 tablet 3  . lisinopril-hydrochlorothiazide (ZESTORETIC) 20-12.5 MG tablet Take 2 tablets by mouth daily. 180 tablet 3  . loratadine (CLARITIN) 10 MG tablet TAKE 1 TABLET BY MOUTH DAILY. 30 tablet 2  . metFORMIN (GLUCOPHAGE) 500 MG tablet Take 1 tablet (500 mg total) by mouth daily with breakfast. 90 tablet 3  . mirtazapine (REMERON) 30 MG tablet Take 1 tablet (30 mg total) by mouth at bedtime. 90 tablet 1   No current facility-administered medications on file prior to visit.     Allergies  Allergen Reactions  . Codeine Anaphylaxis  . Shellfish Allergy Swelling and Rash    Throat Sweling  . Opium     Interferes with Keppra  . Banana Rash    Cannot eat any while taking KEPPRA  . Catfish [Fish Allergy] Rash  . Orange Fruit [Citrus] Rash    Cannot have while taking KEPPRA  . Tomato Itching and Rash    Social History   Social History  . Marital status: Divorced    Spouse name: N/A  . Number of children: 1  . Years of education: N/A   Occupational History  . Not on file.  Social History Main Topics  . Smoking status: Never Smoker  . Smokeless tobacco: Never Used  . Alcohol use No  . Drug use: No  . Sexual activity: No   Other Topics Concern  . Not on file   Social History Narrative  . No narrative on file    Family History  Problem Relation Age of Onset  . Asthma Mother   . Hypertension Father   . Heart disease Father   . Stroke Father   . Cancer Sister        uterus cancer   . Diabetes Maternal Uncle   . Breast cancer Cousin   . Colon cancer Neg Hx     Past Surgical History:  Procedure Laterality Date  . ABDOMINAL HYSTERECTOMY    . COLONOSCOPY N/A 07/15/2015     Dr. Darrick PennaFields: small-mouth diverticula, small non-bleeding internal hemorrhoids, redundant colon   . HERNIA REPAIR    . MYOMECTOMY      ROS: Review of Systems Negative except as stated above  PHYSICAL EXAM: BP 112/76   Pulse 77   Temp 98.4 F (36.9 C) (Oral)   Resp 16   Wt 206 lb 12.8 oz (93.8 kg)   SpO2 95%   BMI 37.82 kg/m   Wt Readings from Last 3 Encounters:  09/17/16 206 lb 12.8 oz (93.8 kg)  06/30/16 210 lb (95.3 kg)  06/23/16 207 lb 12.8 oz (94.3 kg)    Physical Exam General appearance - alert, well appearing, and in no distress Mental status - soft spoken demeanor Eyes -RT eye slightly crossed Neck -no cervical lymphadenopathy. No thyromegaly or thyroid nodules palpated.  Chest -clear to auscultation bilaterally. Heart - normal rate, regular rhythm, normal S1, S2, no murmurs, rubs, clicks or gallops Extremities - peripheral pulses normal, no pedal edema, no clubbing or cyanosis Cross eye: RT eye more lateral Ambulates with stick because easy to fall. Knock kneed. Gait scissor like. Turn good Feet: flat footed, deformity of 2nd RT toe due to prior surgery, LEAP abnormal BL, good pulses Neuro: no nystagmus. Knock kneed with scissor gait. She has a rod she she states she uses to help with ambulation as she is prone to falls  Results for orders placed or performed in visit on 09/17/16  POCT glucose (manual entry)  Result Value Ref Range   POC Glucose 111 (A) 70 - 99 mg/dl  POCT glycosylated hemoglobin (Hb A1C)  Result Value Ref Range   Hemoglobin A1C 5.7    Lab Results  Component Value Date   WBC 6.9 02/18/2016   HGB 12.7 02/18/2016   HCT 38.7 02/18/2016   MCV 81.6 02/18/2016   PLT 355 02/18/2016    ASSESSMENT AND PLAN: 1. Prediabetes -Continue metformin. -Encouraged her to continue healthy eating habits and regular exercise - POCT glucose (manual entry) - POCT glycosylated hemoglobin (Hb A1C)  2. Seizures (HCC) -Given to recent seizures, we will  check Keppra level and refer her for early follow-up with neurology - Levetiracetam level - Ambulatory referral to Neurology  3. Essential hypertension At goal. Continue current medication  4. Adjustment disorder with mixed anxiety and depressed mood Patient plugged into behavioral health services already  5. Obesity (BMI 35.0-39.9 without comorbidity) See #1 above  6. Need for influenza vaccination - Flu Vaccine QUAD 6+ mos PF IM (Fluarix Quad PF)   Patient was given the opportunity to ask questions.  Patient verbalized understanding of the plan and was able to repeat key elements of the plan.  Orders Placed This Encounter  Procedures  . Flu Vaccine QUAD 6+ mos PF IM (Fluarix Quad PF)  . Levetiracetam level  . Ambulatory referral to Neurology  . POCT glucose (manual entry)  . POCT glycosylated hemoglobin (Hb A1C)     Requested Prescriptions    No prescriptions requested or ordered in this encounter    Return in about 3 months (around 12/18/2016).  Jonah Blue, MD, FACP

## 2016-09-22 ENCOUNTER — Ambulatory Visit (HOSPITAL_COMMUNITY): Payer: Medicaid Other | Admitting: Psychiatry

## 2016-09-22 LAB — LEVETIRACETAM LEVEL: Levetiracetam Lvl: 24.1 ug/mL (ref 10.0–40.0)

## 2016-09-24 ENCOUNTER — Telehealth: Payer: Self-pay | Admitting: Neurology

## 2016-09-24 DIAGNOSIS — R569 Unspecified convulsions: Secondary | ICD-10-CM

## 2016-09-24 NOTE — Telephone Encounter (Signed)
Pt called and said she was shaking really bad and it lasts for about 45 minutes or so and would like a call back to please advise

## 2016-09-25 ENCOUNTER — Telehealth: Payer: Self-pay

## 2016-09-25 NOTE — Telephone Encounter (Signed)
Contacted pt to go over lab results pt didn't answer lvm asking pt to give me a call at her earliest convenience   If pt calls back please give results: level of her seizure med in her blood was adequate. I will not change dose. I have requested an early appointment with her neurologist.

## 2016-09-25 NOTE — Telephone Encounter (Signed)
Spoke to patient she states she had dizzy spells last month and "blacked out" for at least 47 minutes. Also 2 months ago she had a similar episode lasting 42 minutes. She states that in March she won her social security case and was able to get her medications covered but her Keppra is now generic. She takes it one pill at 3 am, one pill at 9 am, and one pill at 12 pm, and one pill at 3 pm. Please advise any medication changes. Pt is not due to follow up with you until January.

## 2016-09-25 NOTE — Telephone Encounter (Signed)
Spoke to patient, she is agreeable to having an EEG. She prefers a morning appointment.

## 2016-09-25 NOTE — Telephone Encounter (Signed)
Would schedule a 48-hour EEG so we can see potentially where the seizures are coming from, to help treat her better. No driving if she is passing out. Thanks

## 2016-10-08 ENCOUNTER — Ambulatory Visit (INDEPENDENT_AMBULATORY_CARE_PROVIDER_SITE_OTHER): Payer: Medicaid Other | Admitting: Licensed Clinical Social Worker

## 2016-10-08 ENCOUNTER — Encounter (HOSPITAL_COMMUNITY): Payer: Self-pay | Admitting: Licensed Clinical Social Worker

## 2016-10-08 DIAGNOSIS — F331 Major depressive disorder, recurrent, moderate: Secondary | ICD-10-CM

## 2016-10-08 NOTE — Progress Notes (Signed)
THERAPIST PROGRESS NOTE  Session Time: 11:10-12pm  Participation Level: Active  Behavioral Response: CasualAlert/anxious  Type of Therapy: Individual Therapy  Treatment Goals addressed: Coping  Interventions: CBT  Summary: [name] is a 61 y.o. female who presents for her individual counseling session.Pt discussed her psychiatric symptoms and current life events. Pt discussed her current stressor: her daughter. Her daughter's behavior has made a "180" and she is nice again. 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 Pt has used boundaries with her daughter in reference to babysitting. Pt has had some scary health issues. Pt has a hx of seizures and has had 2 since June. She has been in contact with her neurologist and PCP who are going to do new blood work next week. Pt has also been experiencing chest pains so her PCP is referring her to a cardiologist as well. Again discussed healthy coping skills for stress with pt. Again stressed the importance and explanation of purpose process and practice of mindfulness techniques. Taught pt grounding technique ( 1234...) which pt practiced in session.  Pt was receptive to learning new techniques for managing her stress..       Suicidal/Homicidal: Nowithout intent/plan  Therapist Response: Assessed pt's current functioning and reviewed progress. Assisted pt with stressors, mindfulness. Assisted pt in processing her management of stressors.  Plan: Return again in 1 month.  Diagnosis:      Axis I: Major Depressive Disorder, recurrent episode moderate                              MACKENZIE,LISBETH S, LCAS 10/08/16

## 2016-10-09 MED FILL — metFORMIN HCL 500 MG TABS: 500 | 30 days supply | Qty: 30 | Fill #6

## 2016-10-09 MED FILL — FLUTICASONE PROP 50 MCG SPR: 50 | 30 days supply | Qty: 16 | Fill #1

## 2016-10-09 MED FILL — MIRTAZAPINE 30 MG TABLET: 30 | 30 days supply | Qty: 30 | Fill #3

## 2016-10-09 MED FILL — AMLODIPINE BESYLATE 10 MG T: 10 | 30 days supply | Qty: 30 | Fill #7

## 2016-10-12 ENCOUNTER — Encounter: Payer: Self-pay | Admitting: Internal Medicine

## 2016-10-12 ENCOUNTER — Ambulatory Visit: Payer: Medicaid Other | Attending: Internal Medicine | Admitting: Internal Medicine

## 2016-10-12 ENCOUNTER — Other Ambulatory Visit: Payer: Self-pay

## 2016-10-12 VITALS — BP 135/79 | HR 77 | Temp 98.4°F | Resp 18 | Ht 62.5 in | Wt 210.0 lb

## 2016-10-12 DIAGNOSIS — I10 Essential (primary) hypertension: Secondary | ICD-10-CM | POA: Diagnosis not present

## 2016-10-12 DIAGNOSIS — L408 Other psoriasis: Secondary | ICD-10-CM

## 2016-10-12 DIAGNOSIS — Z7982 Long term (current) use of aspirin: Secondary | ICD-10-CM | POA: Insufficient documentation

## 2016-10-12 DIAGNOSIS — Z8249 Family history of ischemic heart disease and other diseases of the circulatory system: Secondary | ICD-10-CM | POA: Diagnosis not present

## 2016-10-12 DIAGNOSIS — M2141 Flat foot [pes planus] (acquired), right foot: Secondary | ICD-10-CM | POA: Diagnosis not present

## 2016-10-12 DIAGNOSIS — F329 Major depressive disorder, single episode, unspecified: Secondary | ICD-10-CM | POA: Diagnosis not present

## 2016-10-12 DIAGNOSIS — Z7984 Long term (current) use of oral hypoglycemic drugs: Secondary | ICD-10-CM | POA: Insufficient documentation

## 2016-10-12 DIAGNOSIS — K039 Disease of hard tissues of teeth, unspecified: Secondary | ICD-10-CM

## 2016-10-12 DIAGNOSIS — H5213 Myopia, bilateral: Secondary | ICD-10-CM | POA: Insufficient documentation

## 2016-10-12 DIAGNOSIS — F4323 Adjustment disorder with mixed anxiety and depressed mood: Secondary | ICD-10-CM | POA: Diagnosis not present

## 2016-10-12 DIAGNOSIS — R7303 Prediabetes: Secondary | ICD-10-CM | POA: Diagnosis not present

## 2016-10-12 DIAGNOSIS — E669 Obesity, unspecified: Secondary | ICD-10-CM | POA: Insufficient documentation

## 2016-10-12 DIAGNOSIS — Z79899 Other long term (current) drug therapy: Secondary | ICD-10-CM | POA: Diagnosis not present

## 2016-10-12 DIAGNOSIS — R079 Chest pain, unspecified: Secondary | ICD-10-CM | POA: Diagnosis not present

## 2016-10-12 DIAGNOSIS — L409 Psoriasis, unspecified: Secondary | ICD-10-CM | POA: Diagnosis not present

## 2016-10-12 LAB — GLUCOSE, POCT (MANUAL RESULT ENTRY): POC GLUCOSE: 128 mg/dL — AB (ref 70–99)

## 2016-10-12 MED ORDER — ASPIRIN EC 81 MG PO TBEC: 81.0000 mg | DELAYED_RELEASE_TABLET | Freq: Every day | ORAL | 1 refills | Status: DC

## 2016-10-12 MED ORDER — NITROGLYCERIN 0.4 MG SL SUBL: 0.4000 mg | SUBLINGUAL_TABLET | SUBLINGUAL | 0 refills | Status: DC | PRN

## 2016-10-12 MED FILL — NITROSTAT 0.4 MG TABLET SL: 0.4 | 30 days supply | Qty: 25 | Fill #0

## 2016-10-12 MED FILL — LISINOPRIL-HCTZ 20-12.5 MG: 20-12.5 | 30 days supply | Qty: 60 | Fill #9

## 2016-10-12 MED FILL — LEVETIRACETAM ER 500 MG TAB: 500 | 30 days supply | Qty: 120 | Fill #1

## 2016-10-12 NOTE — Progress Notes (Signed)
Patient ID: [name], female    DOB: 1955/07/25  MRN: [id number]  CC: Chest Pain   Subjective: [name] is a 61 y.o. female who presents for UC for CP. Her concerns today include:  Pt with hx of depression, HTN, Sz ds, pre-DM, trichotillomania, internal hemorrhoids, seasonal allergies  1. CP -started this mth.  -substernal b/w the breast Occurring a few times a wk; lasting sec to 3 mins; sharp and hard. Associated with SOB, tightness in the throat and sharp pain down the LT arm -she has been busy this mth helping to care for her grandchildren while daughter works on [employer] -last 2 episodes occur while walking to the grocery store 1 wk ago and chasing her grand-daugher .4 days ago.  -pain subsides when she sits or lay down -fhx of CAD in father   2. Wants referral to dentist for cleaning, dermatologist for scalp psoriasis which flares in winter and eye doctor for blurred vision (wears glasses, has problems with distant vision. Last eye exam 12/2014).  Patient Active Problem List   Diagnosis Date Noted  . Internal hemorrhoid 09/16/2016  . Adjustment disorder with mixed anxiety and depressed mood 12/04/2015  . Primary insomnia 12/04/2015  . Prediabetes 10/09/2013  . Facial droop 10/09/2013  . Seizures (HCC) 08/24/2013  . Obesity, unspecified 10/17/2007  . 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 PES PLANUS, RIGHT 07/20/2007  . DYSTONIA 01/04/2007  . Major depressive disorder, recurrent episode (HCC) 03/18/2006  . HYPERTENSION, BENIGN SYSTEMIC 03/18/2006  . PSORIASIS 03/18/2006     Current Outpatient Prescriptions on File Prior to Visit  Medication Sig Dispense Refill  . amLODipine (NORVASC) 10 MG tablet Take 1 tablet (10 mg total) by mouth daily. 90 tablet 3  . Cholecalciferol (VITAMIN D3) 3000 units TABS Take by mouth.    . fluticasone (FLONASE) 50 MCG/ACT nasal spray Place 2 sprays into both nostrils daily. 16 g 6  . levETIRAcetam (KEPPRA XR) 500 MG 24 hr tablet  Take 2 tablets (1,000 mg total) by mouth 2 (two) times daily. 300 tablet 3  . lisinopril-hydrochlorothiazide (ZESTORETIC) 20-12.5 MG tablet Take 2 tablets by mouth daily. 180 tablet 3  . loratadine (CLARITIN) 10 MG tablet TAKE 1 TABLET BY MOUTH DAILY. 30 tablet 2  . metFORMIN (GLUCOPHAGE) 500 MG tablet Take 1 tablet (500 mg total) by mouth daily with breakfast. 90 tablet 3  . mirtazapine (REMERON) 30 MG tablet Take 1 tablet (30 mg total) by mouth at bedtime. 90 tablet 1   No current facility-administered medications on file prior to visit.     Allergies  Allergen Reactions  . Codeine Anaphylaxis  . Shellfish Allergy Swelling and Rash    Throat Sweling  . Opium     Interferes with Keppra  . Banana Rash    Cannot eat any while taking KEPPRA  . Catfish [Fish Allergy] Rash  . Orange Fruit [Citrus] Rash    Cannot have while taking KEPPRA  . Tomato Itching and Rash    Social History   Social History  . Marital status: Divorced    Spouse name: N/A  . Number of children: 1  . Years of education: N/A   Occupational History  . Not on file.   Social History Main Topics  . Smoking status: Never Smoker  . Smokeless tobacco: Never Used  . Alcohol use No  . Drug use: No  . Sexual activity: No   Other Topics Concern  . Not on file   Social History  Narrative  . No narrative on file    Family History  Problem Relation Age of Onset  . Asthma Mother   . Hypertension Father   . Heart disease Father   . Stroke Father   . Cancer Sister        uterus cancer   . Diabetes Maternal Uncle   . Breast cancer Cousin   . Colon cancer Neg Hx     Past Surgical History:  Procedure Laterality Date  . ABDOMINAL HYSTERECTOMY    . COLONOSCOPY N/A 07/15/2015   Dr. Darrick Penna: small-mouth diverticula, small non-bleeding internal hemorrhoids, redundant colon   . HERNIA REPAIR    . MYOMECTOMY      ROS: Review of Systems Neg except as above PHYSICAL EXAM: BP 135/79 (BP Location: Left Arm,  Patient Position: Sitting, Cuff Size: Normal)   Pulse 77   Temp 98.4 F (36.9 C) (Oral)   Resp 18   Ht 5' 2.5" (1.588 m)   Wt 210 lb (95.3 kg)   SpO2 97%   BMI 37.80 kg/m   Physical Exam  General appearance - alert, well appearing, and in no distress Mental status - alert, oriented to person, place, and time, normal mood, behavior, speech, dress, motor activity, and thought processes Neck - supple, no significant adenopathy Chest - clear to auscultation, no wheezes, rales or rhonchi, symmetric air entry Heart - normal rate, regular rhythm, normal S1, S2, no murmurs, rubs, clicks or gallops. No reproducible tenderness on palpation over the sternum Extremities - peripheral pulses normal, no pedal edema, no clubbing or cyanosis  EKG: NSR, normal EKG Results for orders placed or performed in visit on 09/17/16  Levetiracetam level  Result Value Ref Range   Levetiracetam Lvl 24.1 10.0 - 40.0 ug/mL  POCT glucose (manual entry)  Result Value Ref Range   POC Glucose 111 (A) 70 - 99 mg/dl  POCT glycosylated hemoglobin (Hb A1C)  Result Value Ref Range   Hemoglobin A1C 5.7     ASSESSMENT AND PLAN: 1. Chest pain, unspecified type -hx worrisome for angina. RF include fhx and HTN -Wi'll refer to cardiology -Start low-dose aspirin. Have given sublingual nitroglycerin to use when necessary. I went over how to use it. - aspirin EC 81 MG tablet; Take 1 tablet (81 mg total) by mouth daily.  Dispense: 100 tablet; Refill: 1 - nitroGLYCERIN (NITROSTAT) 0.4 MG SL tablet; Place 1 tablet (0.4 mg total) under the tongue every 5 (five) minutes as needed for chest pain.  Dispense: 30 tablet; Refill: 0 - Ambulatory referral to Cardiology  2. PSORIASIS - Ambulatory referral to Dermatology  3. Myopia of both eyes Ophthalmology referral 4. Teeth hard tissue disease - Ambulatory referral to Dentistry  5. Prediabetes - POCT glucose (manual entry)  Patient was given the opportunity to ask questions.   Patient verbalized understanding of the plan and was able to repeat key elements of the plan.   Orders Placed This Encounter  Procedures  . Ambulatory referral to Cardiology  . Ambulatory referral to Dentistry  . Ambulatory referral to Dermatology  . POCT glucose (manual entry)     Requested Prescriptions   Signed Prescriptions Disp Refills  . aspirin EC 81 MG tablet 100 tablet 1    Sig: Take 1 tablet (81 mg total) by mouth daily.  . nitroGLYCERIN (NITROSTAT) 0.4 MG SL tablet 30 tablet 0    Sig: Place 1 tablet (0.4 mg total) under the tongue every 5 (five) minutes as needed for chest pain.  No Follow-up on file.  Karle Plumber, MD, FACP

## 2016-10-12 NOTE — Patient Instructions (Signed)
You have been referred to cardiology.  Start Aspirin and SL nitro as discussed.

## 2016-10-13 ENCOUNTER — Ambulatory Visit (INDEPENDENT_AMBULATORY_CARE_PROVIDER_SITE_OTHER): Payer: Medicaid Other | Admitting: Neurology

## 2016-10-13 ENCOUNTER — Encounter: Payer: Self-pay | Admitting: Neurology

## 2016-10-13 VITALS — BP 118/74 | HR 66 | Ht 62.0 in | Wt 209.0 lb

## 2016-10-13 DIAGNOSIS — R569 Unspecified convulsions: Secondary | ICD-10-CM

## 2016-10-13 NOTE — Progress Notes (Signed)
NEUROLOGY FOLLOW UP OFFICE NOTE  [name] [id number]  HISTORY OF PRESENT ILLNESS: I had the pleasure of seeing [name] in follow-up in the neurology clinic on 10/13/2016.  The patient was last seen 8 months ago for a diagnosis of seizures. She was having recurrent episodes of loss of consciousness with report of right-sided symptoms initially, but recently symptoms are preceded by pain and discomfort in her extremities (LE>UE). 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 She was started on Keppra  BID at [facility] last 2015 for seizures. She had been doing well with no spells for a year, until Dec 2017 when she had a "light one," she was alone at home and fell on the carpet. She states she was aware that she had to be in a fetal position while she was shaking. She did not injure herself. This occurred during the holidays when she was stressed out and emotional, having chest pains daily. She called our office at the beginning of the month to report dizzy spells in August and blacking out for at least 47 minutes. She had a similar episode in July lasting 42 minutes. Both episodes were unwitnessed, she reports she had the shaking and could see herself shaking, and had timed the episodes. She reported that she was switched to generic Levetiracetam, she takes 1 tab 4 times a day, because taking 2 tablets at the same time was too much. She had previously reported that seizures occur during times of stress, and reports that she has been stressed recently with her daughter's schedule. Her daughter has now graduated and doing well, she is feeling better as well. She has been noticing severe chest pains and left arm numbness and has been referred to Cardiology. She denies any vision changes, olfactory/gustatory hallucinations, myoclonic jerks. She continues to see psychiatry and therapy.   HPI: This is a pleasant 61 yo RH woman with a  history of hypertension and a diagnosis of seizures. She reports that neurological symptoms started in 2005 when she would have involuntary movements of the right leg. She states she had lost her grandmother and this had an effect on her, and had symptoms since then. She had seen neurologist Dr. Aletha Halim at that time and was diagnosed with focal limb dystomia, told that this may be caused by a chemical imbalance. She was treated with Botox but did not tolerate it. She reports seeing Dr. Estella Husk from 2006 to 2009, but also at the same time she started having blackouts. She reports that she had a blackout in her doctor's office one time, but instead of being brought to [facility] ER, she was brought to [facility]. She has had blackouts so bad that neighbors would tell her she needed to go to the ER. She usually starts feeling pain in both legs, followed by right-sided shaking, numbness on the left side of her face, throat gets tight, then she falls to the floor. She lives by herself and has woken up on the floor. She reports that she can see herself shaking and would go into a fetal position to "ride it out."   In August 2015, she was brought to the ER due to recent increase in frequency of spells. She reported bilateral numbness and tingling as well as a feeling of discomfort in both upper and lower extremities. She has been told she has convulsions involving her legs. She reported urinary incontinence and tongue soreness. Her BP  was very high at that time. 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 No definite gliosis of heterotopic gray matter. Her routine EEG was normal. She was discharged home on Keppra  BID which she has been tolerating without side effects. She reports doing well with no  similar symptoms until the October 2016. She brings a calendar of her events, She had one last 10/30/14. She reports she had been stressed because her daughter told her that day that she is moving to [place] to what her mother thinks is an unhealthy relationship. She had another episode on 10/30 while walking. On 11/25/14 she had 2 episodes, one lasting 12 minutes, another one lasting an hour. She reports being in the shower, she grabbed the handle and sat in the tub rocking in a fetal position, which she found helps. She believes stress is causing the recent seizures and talking to a therapist does help her. She states she does not know how to relax. 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 There is no history of febrile convulsions, CNS infections such as meningitis/encephalitis, significant traumatic brain injury, neurosurgical procedures, or family history of seizures.  PAST MEDICAL HISTORY: Past Medical History:  Diagnosis Date  . Anxiety   . Carpal tunnel syndrome   . Diabetes mellitus without complication (HCC)   . Diabetes mellitus, type II (HCC)   . DYSTONIA 01/04/2007   Qualifier: Diagnosis of  By: Humberto Seals NP, Darl Pikes    . Facial droop 10/09/2013  . Hemorrhoid   . Hypertension   . Major depressive disorder, recurrent episode (HCC) 03/18/2006   Qualifier: Diagnosis of  By: Bradly Bienenstock    . Obesity, unspecified 10/17/2007   Qualifier: Diagnosis of  By: Gerilyn Pilgrim PHD, Jeannie    . Prediabetes 10/09/2013  . Rectal bleeding 07/03/2015  . Seizures (HCC)   . Seizures (HCC) 08/24/2013    MEDICATIONS:  Outpatient Encounter Prescriptions as of 10/13/2016  Medication Sig  . amLODipine (NORVASC) 10 MG tablet Take 1 tablet (10 mg total) by mouth daily.  [name] aspirin EC 81 MG tablet Take 1 tablet (81 mg total) by mouth daily.  . Cholecalciferol (VITAMIN D3) 3000 units TABS Take by mouth.  . fluticasone (FLONASE) 50 MCG/ACT nasal spray Place 2 sprays into both  nostrils daily.  [name] levETIRAcetam (KEPPRA XR) 500 MG 24 hr tablet Take 2 tablets (1,000 mg total) by mouth 2 (two) times daily.  [name] lisinopril-hydrochlorothiazide (ZESTORETIC) 20-12.5 MG tablet Take 2 tablets by mouth daily.  [name] loratadine (CLARITIN) 10 MG tablet TAKE 1 TABLET BY MOUTH DAILY.  . metFORMIN (GLUCOPHAGE) 500 MG tablet Take 1 tablet (500 mg total) by mouth daily with breakfast.  . mirtazapine (REMERON) 30 MG tablet Take 1 tablet (30 mg total) by mouth at bedtime.  . nitroGLYCERIN (NITROSTAT) 0.4 MG SL tablet Place 1 tablet (0.4 mg total) under the tongue every 5 (five) minutes as needed for chest pain.   No facility-administered encounter medications on file as of 10/13/2016.     ALLERGIES: Allergies  Allergen Reactions  . Codeine Anaphylaxis  . Shellfish Allergy Swelling and Rash    Throat Sweling  . Opium     Interferes with Keppra  . Banana Rash    Cannot eat any while taking KEPPRA  . Catfish [Fish Allergy] Rash  . Orange  Fruit [Citrus] Rash    Cannot have while taking KEPPRA  . Tomato Itching and Rash    FAMILY HISTORY: Family History  Problem Relation Age of Onset  . Asthma Mother   . Hypertension Father   . Heart disease Father   . Stroke Father   . Cancer Sister        uterus cancer   . Diabetes Maternal Uncle   . Breast cancer Cousin   . Colon cancer Neg Hx     SOCIAL HISTORY: Social History   Social History  . Marital status: Divorced    Spouse name: N/A  . Number of children: 1  . Years of education: N/A   Occupational History  . Not on file.   Social History Main Topics  . Smoking status: Never Smoker  . Smokeless tobacco: Never Used  . Alcohol use No  . Drug use: No  . Sexual activity: No   Other Topics Concern  . Not on file   Social History Narrative  . No narrative on file    REVIEW OF SYSTEMS: Constitutional: No fevers, chills, or sweats, no generalized fatigue, change in appetite Eyes: No visual changes, double vision,  eye pain Ear, nose and throat: No hearing loss, ear pain, nasal congestion, sore throat Cardiovascular: No chest pain, palpitations Respiratory:  No shortness of breath at rest or with exertion, wheezes GastrointestinaI: No nausea, vomiting, diarrhea, abdominal pain, fecal incontinence Genitourinary:  No dysuria, urinary retention or frequency Musculoskeletal:  No neck pain, back pain Integumentary: No rash, pruritus, skin lesions Neurological: as above Psychiatric: + depression, insomnia, anxiety Endocrine: No palpitations, fatigue, diaphoresis, mood swings, change in appetite, change in weight, increased thirst Hematologic/Lymphatic:  No anemia, purpura, petechiae. Allergic/Immunologic: no itchy/runny eyes, nasal congestion, recent allergic reactions, rashes  PHYSICAL EXAM: Vitals:   10/13/16 1505  BP: 118/74  Pulse: 66  SpO2: 94%   General: No acute distress Head:  Normocephalic/atraumatic Neck: supple, no paraspinal tenderness, full range of motion Heart:  Regular rate and rhythm Lungs:  Clear to auscultation bilaterally Back: No paraspinal tenderness Skin/Extremities: No rash, no edema Neurological Exam: alert and oriented to person, place, and time. No aphasia or dysarthria. Fund of knowledge is appropriate.  Recent and remote memory are intact.  Attention and concentration are normal.    Able to name objects and repeat phrases. Cranial nerves: Pupils equal, round, reactive to light.  Extraocular movements intact with no nystagmus, right esotropia. Visual fields full. Facial sensation intact. No facial asymmetry. Tongue, uvula, palate midline.  Motor: Bulk and tone normal, muscle strength 5/5 throughout with no pronator drift.  Sensation to light touch intact.  No extinction to double simultaneous stimulation.  Deep tendon reflexes 2+ throughout, toes downgoing.  Finger to nose testing intact.  Gait narrow-based, with right foot tending to out-toe.  Romberg  negative.  IMPRESSION: This is a pleasant 61 yo RH woman with a history of hypertension and recurrent episodes of loss of consciousness with report of right-sided symptoms initially, but recently symptoms are preceded by pain and discomfort in her extremities (LE>UE). 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 She goes event-free for a year since starting Keppra,  she had 2 prolonged episodes lasting 40+ minutes in July and August. She reports being under stress at that time. She will be scheduled for a 48-hour EEG to further classify her seizures. Continue Keppra  4 times a day (taking 2 tabs BID was "too much" together). Continue follow-up with psychiatry and therapy. She does not drive. She will follow-up in 6 months or earlier if needed.   Thank you for allowing me to participate in her care.  Please do not hesitate to call for any questions or concerns.  The duration of this appointment visit was 25 minutes of face-to-face time with the patient.  Greater than 50% of this time was spent in counseling, explanation of diagnosis, planning of further management, and coordination of care.   Patrcia Dolly, M.D.   CC: Dr. Laural Benes

## 2016-10-13 NOTE — Patient Instructions (Signed)
1. Continue Keppra  4 times a day 2. Schedule a 48-hour EEG 3. Continue follow-up with psychiatry and therapy, continue stress management 4. Follow-up in 6 months, call for any changes  Seizure Precautions: 1. If medication has been prescribed for you to prevent seizures, take it exactly as directed.  Do not stop taking the medicine without talking to your doctor first, even if you have not had a seizure in a long time.   2. Avoid activities in which a seizure would cause danger to yourself or to others.  Don't operate dangerous machinery, swim alone, or climb in high or dangerous places, such as on ladders, roofs, or girders.  Do not drive unless your doctor says you may.  3. If you have any warning that you may have a seizure, lay down in a safe place where you can't hurt yourself.    4.  No driving for 6 months from last seizure, as per [facility].   Please refer to the following link on the Epilepsy Foundation of America's website for more information: http://www.epilepsyfoundation.org/answerplace/Social/driving/drivingu.cfm   5.  Maintain good sleep hygiene. Avoid alcohol.  6.  Contact your doctor if you have any problems that may be related to the medicine you are taking.  7.  Call 911 and bring the patient back to the ED if:        A.  The seizure lasts longer than 5 minutes.       B.  The patient doesn't awaken shortly after the seizure  C.  The patient has new problems such as difficulty seeing, speaking or moving  D.  The patient was injured during the seizure  E.  The patient has a temperature over 102 F (39C)  F.  The patient vomited and now is having trouble breathing

## 2016-10-14 ENCOUNTER — Encounter: Payer: Self-pay | Admitting: Neurology

## 2016-11-02 ENCOUNTER — Ambulatory Visit (INDEPENDENT_AMBULATORY_CARE_PROVIDER_SITE_OTHER): Payer: Medicaid Other | Admitting: Neurology

## 2016-11-02 DIAGNOSIS — R569 Unspecified convulsions: Secondary | ICD-10-CM | POA: Diagnosis not present

## 2016-11-09 ENCOUNTER — Encounter (HOSPITAL_COMMUNITY): Payer: Self-pay | Admitting: Psychiatry

## 2016-11-09 ENCOUNTER — Encounter (HOSPITAL_COMMUNITY): Payer: Self-pay

## 2016-11-09 ENCOUNTER — Ambulatory Visit (INDEPENDENT_AMBULATORY_CARE_PROVIDER_SITE_OTHER): Payer: Medicaid Other | Admitting: Psychiatry

## 2016-11-09 VITALS — BP 128/74 | HR 65 | Ht 62.0 in | Wt 208.8 lb

## 2016-11-09 DIAGNOSIS — Z538 Procedure and treatment not carried out for other reasons: Secondary | ICD-10-CM

## 2016-11-12 ENCOUNTER — Ambulatory Visit (HOSPITAL_COMMUNITY): Payer: Self-pay | Admitting: Licensed Clinical Social Worker

## 2016-11-17 ENCOUNTER — Ambulatory Visit (INDEPENDENT_AMBULATORY_CARE_PROVIDER_SITE_OTHER): Payer: Medicaid Other | Admitting: Cardiology

## 2016-11-17 ENCOUNTER — Encounter: Payer: Self-pay | Admitting: Cardiology

## 2016-11-17 VITALS — BP 150/110 | HR 64 | Resp 16 | Ht 62.0 in | Wt 208.0 lb

## 2016-11-17 DIAGNOSIS — R079 Chest pain, unspecified: Secondary | ICD-10-CM

## 2016-11-17 DIAGNOSIS — R0789 Other chest pain: Secondary | ICD-10-CM | POA: Diagnosis not present

## 2016-11-17 MED FILL — MIRTAZAPINE 30 MG TABLET: 30 | 30 days supply | Qty: 30 | Fill #4

## 2016-11-17 MED FILL — AMLODIPINE BESYLATE 10 MG T: 10 | 30 days supply | Qty: 30 | Fill #8

## 2016-11-17 MED FILL — metFORMIN HCL 500 MG TABS: 500 | 30 days supply | Qty: 30 | Fill #7

## 2016-11-17 MED FILL — NITROSTAT 0.4 MG TABLET SL: 0.4 | 6 days supply | Qty: 5 | Fill #1

## 2016-11-17 MED FILL — FLUTICASONE PROP 50 MCG SPR: 50 | 30 days supply | Qty: 16 | Fill #2

## 2016-11-17 MED FILL — LEVETIRACETAM ER 500 MG TAB: 500 | 30 days supply | Qty: 120 | Fill #2

## 2016-11-17 NOTE — Progress Notes (Signed)
11/17/2016 [name]   Jun 08, 1955  [id number]  Primary Physician Marcine Matar, MD Primary Cardiologist: New (Dr. Eden Emms, DOD)    Reason for Visit/CC: New Patient Evaluation for chest pain  HPI:  [name] is a 61 y.o. female who is being seen today, as a new patient, for the evaluation of chest pain at the request of [facility]ine [facility]ar, [facility], [facility].  She has epilepsy. [facility] and complained of chest pain, thus referral was placed to cardiology for further evaluation.  Cardiac risk factors include hypertension, family h/o CAD and diabetes.  Her father had coronary artery disease. He had 3 MIs. She states he was young when he had his first MI (66s).  With the exception of low HDL, her lipid profile earlier this year was good.  LDL was 30.  Triglycerides 124.  HDL was low at 43.  Last hemoglobin A1c was 5.8. She is on Metformin.  Her HTN is managed with amlodipine, lisinopril and hydrochlorothiazide.  She takes baby aspirin daily. She denies tobacco use.   She complains of intermittent chest discomfort associated with tightness in the throat and sharp pain down the left arm. First started 3-4 months ago. Occurs several times a week. Mixed typical and atypical features. Substernal pain with radiation to throat and down left arm. Combination of sharp stabbing pain and tightness. Sometimes worse after meals, also worse if trying to walk up an incline. Improves with rest. She has not tried any OTC meds. Symptoms are often prolonged. She had 1 episode that lasted 3.5 hrs.   She is currently CP free. EKG shows NSR. No ischemic abnormalities. Her BP is elevated however she reports she has not yet taken her morning meds.   Current Meds  Medication Sig  . amLODipine (NORVASC) 10 MG tablet Take 1 tablet (10 mg total) by mouth daily.  [name] aspirin EC 81 MG tablet Take 1 tablet (81 mg total) by mouth daily.  .  Cholecalciferol (VITAMIN D3) 3000 units TABS Take by mouth.  . fluticasone (FLONASE) 50 MCG/ACT nasal spray Place 2 sprays into both nostrils daily.  [name] levETIRAcetam (KEPPRA XR) 500 MG 24 hr tablet Take 2 tablets (1,000 mg total) by mouth 2 (two) times daily.  [name] lisinopril-hydrochlorothiazide (ZESTORETIC) 20-12.5 MG tablet Take 2 tablets by mouth daily.  [name] loratadine (CLARITIN) 10 MG tablet TAKE 1 TABLET BY MOUTH DAILY.  . metFORMIN (GLUCOPHAGE) 500 MG tablet Take 1 tablet (500 mg total) by mouth daily with breakfast.  . mirtazapine (REMERON) 30 MG tablet Take 1 tablet (30 mg total) by mouth at bedtime.  . nitroGLYCERIN (NITROSTAT) 0.4 MG SL tablet Place 1 tablet (0.4 mg total) under the tongue every 5 (five) minutes as needed for chest pain.   Allergies  Allergen Reactions  . Codeine Anaphylaxis  . Shellfish Allergy Swelling and Rash    Throat Sweling  . Opium     Interferes with Keppra  . Banana Rash    Cannot eat any while taking KEPPRA  . Catfish [Fish Allergy] Rash  . Orange Fruit [Citrus] Rash    Cannot have while taking KEPPRA  . Tomato Itching and Rash   Past Medical History:  Diagnosis Date  . Anxiety   . Carpal tunnel syndrome   . Diabetes mellitus without complication (HCC)   . Diabetes mellitus, type II (HCC)   . DYSTONIA 01/04/2007   Qualifier: Diagnosis of  By: Humberto Seals  NP, Darl PikesSusan    . Facial droop 10/09/2013  . Hemorrhoid   . Hypertension   . Major depressive disorder, recurrent episode (HCC) 03/18/2006   Qualifier: Diagnosis of  By: Bradly BienenstockHarrelson, Kathy    . Obesity, unspecified 10/17/2007   Qualifier: Diagnosis of  By: Gerilyn PilgrimSykes PHD, Jeannie    . Prediabetes 10/09/2013  . Rectal bleeding 07/03/2015  . Seizures (HCC)   . Seizures (HCC) 08/24/2013   Family History  Problem Relation Age of Onset  . Asthma Mother   . Hypertension Father   . Heart disease Father   . Stroke Father   . Cancer Sister        uterus cancer   . Diabetes Maternal Uncle   . Breast cancer Cousin     . Colon cancer Neg Hx    Past Surgical History:  Procedure Laterality Date  . ABDOMINAL HYSTERECTOMY    . COLONOSCOPY N/A 07/15/2015   Dr. Darrick PennaFields: small-mouth diverticula, small non-bleeding internal hemorrhoids, redundant colon   . HERNIA REPAIR    . MYOMECTOMY     Social History   Social History  . Marital status: Divorced    Spouse name: N/A  . Number of children: 1  . Years of education: N/A   Occupational History  . Not on file.   Social History Main Topics  . Smoking status: Never Smoker  . Smokeless tobacco: Never Used  . Alcohol use No  . Drug use: No  . Sexual activity: No   Other Topics Concern  . Not on file   Social History Narrative  . No narrative on file     Review of Systems: General: negative for chills, fever, night sweats or weight changes.  Cardiovascular: negative for chest pain, dyspnea on exertion, edema, orthopnea, palpitations, paroxysmal nocturnal dyspnea or shortness of breath Dermatological: negative for rash Respiratory: negative for cough or wheezing Urologic: negative for hematuria Abdominal: negative for nausea, vomiting, diarrhea, bright red blood per rectum, melena, or hematemesis Neurologic: negative for visual changes, syncope, or dizziness All other systems reviewed and are otherwise negative except as noted above.   Physical Exam:  Blood pressure (!) 150/110, pulse 64, resp. rate 16, height 5\' 2"  (1.575 m), weight 208 lb (94.3 kg), SpO2 96 %.  General appearance: alert, cooperative and no distress Neck: no carotid bruit and no JVD Lungs: clear to auscultation bilaterally Heart: regular rate and rhythm, S1, S2 normal, no murmur, click, rub or gallop Extremities: extremities normal, atraumatic, no cyanosis or edema Pulses: 2+ and symmetric Skin: Skin color, texture, turgor normal. No rashes or lesions Neurologic: Grossly normal  EKG NSR no ischemic abnormalities  -- personally reviewed   ASSESSMENT AND PLAN:   1. Chest  Pain: mixed typical and atypical features. Currently CP free. EKG nonischemic. Some of her chest pain characteristics seem consistent with GI etiology. However given her multiple cardiac risk factors, we will plan for Lexiscan NST (pt unable to ambulate on treadmill due to reports of neuropathy). I've discussed plan with Dr. Eden EmmsNishan, DOD, who agrees with stress testing for risk stratification.   2. HTN: followed by PCP. Currently elevated, but pt has not yet taken morning meds. Asymptomatic. Continue amlodipine, lisinopril-HCTZ.  3. Diabetes: Controlled with metformin. This is monitored by PCP.   4. Epilepsy: on Keppra.    Follow-Up: Lexiscan NST. F/u after nuclear study.   Kane Kusek Delmer IslamSimmons PA-C, M[facility] 11/17/2016 9:16 AM

## 2016-11-17 NOTE — Patient Instructions (Signed)
Medication Instructions: Your physician recommends that you continue on your current medications as directed. Please refer to the Current Medication list given to you today.  Labwork: None Ordered  Procedures/Testing: Your physician has requested that you have a lexiscan myoview. For further information please visit [email or web address]. Please follow instruction sheet, as given.  Follow-Up: Your physician recommends that you schedule a follow-up appointment in 4-6 weeks with Robbie LisBrittainy Simmons, PA-C or any APP on Dr. Fabio BeringNishan's Team   If you need a refill on your cardiac medications before your next appointment, please call your pharmacy.

## 2016-11-17 NOTE — Progress Notes (Signed)
Cancel visit

## 2016-11-24 ENCOUNTER — Other Ambulatory Visit: Payer: Self-pay | Admitting: Pharmacist

## 2016-11-24 ENCOUNTER — Ambulatory Visit (INDEPENDENT_AMBULATORY_CARE_PROVIDER_SITE_OTHER): Payer: Medicaid Other | Admitting: Licensed Clinical Social Worker

## 2016-11-24 DIAGNOSIS — F331 Major depressive disorder, recurrent, moderate: Secondary | ICD-10-CM | POA: Diagnosis not present

## 2016-11-24 MED ORDER — LISINOPRIL-HYDROCHLOROTHIAZIDE 20-12.5 MG PO TABS: 2.0000 | ORAL_TABLET | Freq: Every day | ORAL | 0 refills | Status: DC

## 2016-11-25 ENCOUNTER — Telehealth (HOSPITAL_COMMUNITY): Payer: Self-pay | Admitting: *Deleted

## 2016-11-25 ENCOUNTER — Encounter (HOSPITAL_COMMUNITY): Payer: Self-pay | Admitting: Licensed Clinical Social Worker

## 2016-11-25 NOTE — Telephone Encounter (Signed)
Left message on voicemail in reference to upcoming appointment scheduled for 12/01/16 Phone number given for a call back so details instructions can be given. [name] Jacqueline   

## 2016-11-25 NOTE — Telephone Encounter (Signed)
Patient given detailed instructions per Myocardial Perfusion Study Information Sheet for the test on 12/01/16 at 10:00. Patient notified to arrive 15 minutes early and that it is imperative to arrive on time for appointment to keep from having the test rescheduled.  If you need to cancel or reschedule your appointment, please call the office within 24 hours of your appointment. . Patient verbalized understanding. Daneil DolinSharon S Brooks
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
## 2016-11-26 NOTE — Procedures (Signed)
ELECTROENCEPHALOGRAM REPORT  Dates of Recording: 11/02/2016 9:48AM to 11/04/2016 9:56AM  Patient's Name: [name] MRN: [id number] Date of Birth: 1955/12/16  Referring Provider: Dr. Patrcia DollyKaren Jaheim Canino  Procedure: 48-hour ambulatory EEG  History: This is a 61 year old woman with recurrent episodes of loss of consciousness preceded by pain in her extremities.  Medications:  KEPPRA XR 500 MG 24 hr tablet   NORVASC 10 MG tablet   aspirin EC 81 MG tablet VITAMIN D3 3000 units TABS  FLONASE 50 MCG/ACT nasal spray  ZESTORETIC 20-12.5 MG tablet . CLARITIN 10 MG tablet GLUCOPHAGE)500 MG tablet  REMERON 30 MG tablet  NITROSTAT 0.4 MG SL tablet  Technical Summary: This is a 48-hour multichannel digital EEG recording measured by the international 10-20 system with electrodes applied with paste and impedances below 5000 ohms performed as portable with EKG monitoring.  The digital EEG was referentially recorded, reformatted, and digitally filtered in a variety of bipolar and referential montages for optimal display.    DESCRIPTION OF RECORDING: During maximal wakefulness, there is a poorly sustained 9 Hz posterior dominant rhythm that poorly attenuates to eye opening and eye closure. The record is symmetric. There were no epileptiform discharges or focal slowing seen in wakefulness.  During the recording, the patient progresses through wakefulness, drowsiness, and Stage 2 sleep.  Again, there were no epileptiform discharges seen.  Events: On 10/15 at 1358 hours, she has a sharp pain in her stomach. Electrographically, there were no EEG or EKG changes seen.  On 10/15 at 1422 hours, she has strong numbness in her right hand and arm. Electrographically, there were no EEG or EKG changes seen.  Per staff, she reported dizziness on 10/16 evening but did not pres the button. On 10/16 at 1845 hours, there is a transient significant increase in heart rate from 78 bpm to a rate of 168 bpm for around 2  minutes with no associated epileptiform correlate. She is not on the video at this time.   There were no electrographic seizures seen.  EKG lead except for event noted above/  IMPRESSION: This 48-hour ambulatory EEG study is normal.    CLINICAL CORRELATION: A normal EEG does not exclude a clinical diagnosis of epilepsy. Episodes of numbness did not show EEG correlate. There was a 2-minute episode of significant tachycardia up to 168 bpm with no epileptiform correlate seen. She reported dizziness that evening but did not push button, unclear if related to EKG changes. Recommend Cardiology evaluation.  If further clinical questions remain, inpatient video EEG monitoring may be helpful.   Patrcia DollyKaren Azelie Noguera, M.D.

## 2016-11-27 ENCOUNTER — Telehealth: Payer: Self-pay

## 2016-11-27 NOTE — Telephone Encounter (Signed)
Spoke with pt relaying message below.  Pt states that she is having a Myocardial Perfusion on November 13, and wishes to hold off on anything cardio related until after that, as they may be able to find the reasoning behind her high heart rate at that time.

## 2016-11-27 NOTE — Telephone Encounter (Signed)
-----   Message from Van ClinesKaren M Aquino, MD sent at 11/26/2016 12:46 PM EST ----- Pls let her know the brain wave test was normal. Continue on the same dose of Keppra. On the heart monitor however, there was one time where her heart rate went very high, this may be when she got dizzy. Recommend she have a 30-day holter monitor, pls let her know and schedule with Cards. Thanks

## 2016-12-01 ENCOUNTER — Ambulatory Visit (HOSPITAL_COMMUNITY): Payer: Medicaid Other | Attending: Cardiology

## 2016-12-01 DIAGNOSIS — R9439 Abnormal result of other cardiovascular function study: Secondary | ICD-10-CM | POA: Insufficient documentation

## 2016-12-01 DIAGNOSIS — R0789 Other chest pain: Secondary | ICD-10-CM | POA: Diagnosis not present

## 2016-12-01 LAB — MYOCARDIAL PERFUSION IMAGING
CHL CUP NUCLEAR SDS: 5
CHL CUP RESTING HR STRESS: 67 {beats}/min
LHR: 0.27
LV dias vol: 78 mL (ref 46–106)
LVSYSVOL: 26 mL
NUC STRESS TID: 0.94
Peak HR: 104 {beats}/min
SRS: 11
SSS: 16

## 2016-12-01 MED ORDER — TECHNETIUM TC 99M TETROFOSMIN IV KIT
9.8000 | PACK | Freq: Once | INTRAVENOUS | Status: AC | PRN
  Administered 2016-12-01: 9.8 via INTRAVENOUS
  Filled 2016-12-01: qty 10

## 2016-12-01 MED ORDER — TECHNETIUM TC 99M TETROFOSMIN IV KIT
31.6000 | PACK | Freq: Once | INTRAVENOUS | Status: AC | PRN
  Administered 2016-12-01: 31.6 via INTRAVENOUS
  Filled 2016-12-01: qty 32

## 2016-12-01 MED ORDER — REGADENOSON 0.4 MG/5ML IV SOLN
0.4000 mg | Freq: Once | INTRAVENOUS | Status: AC
  Administered 2016-12-01: 0.4 mg via INTRAVENOUS

## 2016-12-15 ENCOUNTER — Ambulatory Visit: Payer: Medicaid Other | Attending: Internal Medicine | Admitting: Internal Medicine

## 2016-12-15 ENCOUNTER — Other Ambulatory Visit: Payer: Self-pay | Admitting: Internal Medicine

## 2016-12-15 ENCOUNTER — Encounter: Payer: Self-pay | Admitting: Internal Medicine

## 2016-12-15 VITALS — BP 155/85 | HR 86 | Temp 98.0°F | Resp 18 | Ht 62.0 in | Wt 213.0 lb

## 2016-12-15 DIAGNOSIS — I10 Essential (primary) hypertension: Secondary | ICD-10-CM | POA: Diagnosis not present

## 2016-12-15 DIAGNOSIS — R079 Chest pain, unspecified: Secondary | ICD-10-CM

## 2016-12-15 DIAGNOSIS — E669 Obesity, unspecified: Secondary | ICD-10-CM

## 2016-12-15 DIAGNOSIS — R002 Palpitations: Secondary | ICD-10-CM | POA: Diagnosis not present

## 2016-12-15 DIAGNOSIS — R7303 Prediabetes: Secondary | ICD-10-CM | POA: Diagnosis not present

## 2016-12-15 DIAGNOSIS — R42 Dizziness and giddiness: Secondary | ICD-10-CM

## 2016-12-15 LAB — GLUCOSE, POCT (MANUAL RESULT ENTRY): POC Glucose: 126 mg/dl — AB (ref 70–99)

## 2016-12-15 MED ORDER — LISINOPRIL-HYDROCHLOROTHIAZIDE 20-12.5 MG PO TABS: 2.0000 | ORAL_TABLET | Freq: Every day | ORAL | 3 refills | Status: DC

## 2016-12-15 MED FILL — LEVETIRACETAM ER 500 MG TAB: 500 | 30 days supply | Qty: 120 | Fill #3

## 2016-12-15 MED FILL — metFORMIN HCL 500 MG TABS: 500 | 30 days supply | Qty: 30 | Fill #8

## 2016-12-15 MED FILL — FLUTICASONE PROP 50 MCG SPR: 50 | 30 days supply | Qty: 16 | Fill #3

## 2016-12-15 MED FILL — MIRTAZAPINE 30 MG TABLET: 30 | 30 days supply | Qty: 30 | Fill #5

## 2016-12-15 MED FILL — NITROSTAT 0.4 MG TABLET SL: 0.4 | 25 days supply | Qty: 25 | Fill #0

## 2016-12-15 MED FILL — LISINOPRIL-HCTZ 20-12.5 MG: 20-12.5 | 30 days supply | Qty: 60 | Fill #0

## 2016-12-15 MED FILL — AMLODIPINE BESYLATE 10 MG T: 10 | 30 days supply | Qty: 30 | Fill #9

## 2016-12-15 NOTE — Patient Instructions (Signed)
Please let cardiologist know about the dizziness with palpitations that you have been having. You will probably need a holter monitor.  I will check your thyroid level first to make sure you do not have over functioning thyroid.

## 2016-12-15 NOTE — Progress Notes (Signed)
Patient ID: Kirsten RampRenee Muntean, female    DOB: 16-Jun-1955  MRN: [id number]  CC: Follow-up   Subjective: [name] is a 61 y.o. female who presents for chronic ds management Her concerns today include:  Pt with hx of depression, HTN, Sz ds, pre-DM, trichotillomania, internal hemorrhoids, seasonal allergies  1. CP: saw cardiology and had Lexiscan that was low risk but showed ?area of ischemia vs breast attentuation -had EEG. This showed an episode of increase HR to 168 for 2 mins. Pt reported dizziness but no abnormal epileptiform waves. Her neurologist Dr. Karel JarvisAquino recommended that she be seen by cardiology -reports intermittent episodes of dizziness over past several wks lasting 2-7 mins. Sometimes she feels palpitations. She reports inc stress in keeping her daughter's three young children on wkends -does not drink coffee but does drink green tea every day.   2.  HTN: compliant with Norvasc and Lisiopril/HCTZ -no SOB or LE edema  3. Obesity/Pre-DM:  "I've been eating. During the holidays I have the tendency to eat more." -Gained about 4 pounds since her last visit. Not as active as she would like to be  4. Sz: Saw her neurologist and had EEG that was normal except for increased heart rate that was noted.  There is a question of pseudoseizures underlying her seizure disorder. -Patient advised to continue Keppra at current dose. No recent Sz Patient Active Problem List   Diagnosis Date Noted  . Internal hemorrhoid 09/16/2016  . Adjustment disorder with mixed anxiety and depressed mood 12/04/2015  . Primary insomnia 12/04/2015  . Prediabetes 10/09/2013  . Facial droop 10/09/2013  . Seizures (HCC) 08/24/2013  . Obesity, unspecified 10/17/2007  . 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 PES PLANUS, RIGHT 07/20/2007  . DYSTONIA 01/04/2007  . Major depressive disorder, recurrent episode (HCC) 03/18/2006  . HYPERTENSION, BENIGN SYSTEMIC 03/18/2006  . PSORIASIS 03/18/2006     Current  Outpatient Medications on File Prior to Visit  Medication Sig Dispense Refill  . amLODipine (NORVASC) 10 MG tablet Take 1 tablet (10 mg total) by mouth daily. 90 tablet 3  . aspirin EC 81 MG tablet Take 1 tablet (81 mg total) by mouth daily. 100 tablet 1  . Cholecalciferol (VITAMIN D3) 3000 units TABS Take by mouth.    . fluticasone (FLONASE) 50 MCG/ACT nasal spray Place 2 sprays into both nostrils daily. 16 g 6  . levETIRAcetam (KEPPRA XR) 500 MG 24 hr tablet Take 2 tablets (1,000 mg total) by mouth 2 (two) times daily. 300 tablet 3  . lisinopril-hydrochlorothiazide (ZESTORETIC) 20-12.5 MG tablet Take 2 tablets daily by mouth. 180 tablet 0  . loratadine (CLARITIN) 10 MG tablet TAKE 1 TABLET BY MOUTH DAILY. 30 tablet 2  . metFORMIN (GLUCOPHAGE) 500 MG tablet Take 1 tablet (500 mg total) by mouth daily with breakfast. 90 tablet 3  . mirtazapine (REMERON) 30 MG tablet Take 1 tablet (30 mg total) by mouth at bedtime. 90 tablet 1   No current facility-administered medications on file prior to visit.     Allergies  Allergen Reactions  . Codeine Anaphylaxis  . Shellfish Allergy Swelling and Rash    Throat Sweling  . Opium     Interferes with Keppra  . Banana Rash    Cannot eat any while taking KEPPRA  . Catfish [Fish Allergy] Rash  . Orange Fruit [Citrus] Rash    Cannot have while taking KEPPRA  . Tomato Itching and Rash    Social History   Socioeconomic History  .  Marital status: Divorced    Spouse name: Not on file  . Number of children: 1  . Years of education: Not on file  . Highest education level: Not on file  Social Needs  . Financial resource strain: Not on file  . Food insecurity - worry: Not on file  . Food insecurity - inability: Not on file  . Transportation needs - medical: Not on file  . Transportation needs - non-medical: Not on file  Occupational History  . Not on file  Tobacco Use  . Smoking status: Never Smoker  . Smokeless tobacco: Never Used  Substance  and Sexual Activity  . Alcohol use: No    Alcohol/week: 0.0 oz  . Drug use: No  . Sexual activity: No  Other Topics Concern  . Not on file  Social History Narrative  . Not on file    Family History  Problem Relation Age of Onset  . Asthma Mother   . Hypertension Father   . Heart disease Father   . Stroke Father   . Cancer Sister        uterus cancer   . Diabetes Maternal Uncle   . Breast cancer Cousin   . Colon cancer Neg Hx     Past Surgical History:  Procedure Laterality Date  . ABDOMINAL HYSTERECTOMY    . COLONOSCOPY N/A 07/15/2015   Dr. Darrick PennaFields: small-mouth diverticula, small non-bleeding internal hemorrhoids, redundant colon   . HERNIA REPAIR    . MYOMECTOMY      ROS: Review of Systems Neg except as above PHYSICAL EXAM: BP (!) 155/85 (BP Location: Left Arm, Patient Position: Sitting, Cuff Size: Large)   Pulse 86   Temp 98 F (36.7 C) (Oral)   Resp 18   Ht 5\' 2"  (1.575 m)   Wt 213 lb (96.6 kg)   SpO2 98%   BMI 38.96 kg/m repeat BP 130/86 Wt Readings from Last 3 Encounters:  12/15/16 213 lb (96.6 kg)  11/17/16 208 lb (94.3 kg)  10/13/16 209 lb (94.8 kg)    Physical Exam General appearance - alert, well appearing, and in no distress Mental status - alert, oriented to person, place, and time, normal mood, behavior, speech, dress, motor activity, and thought processes Neck - supple, no significant adenopathy Chest - clear to auscultation, no wheezes, rales or rhonchi, symmetric air entry Heart - normal rate, regular rhythm, normal S1, S2, no murmurs, rubs, clicks or gallops Extremities - peripheral pulses normal, no pedal edema, no clubbing or cyanosis Feet: flat footed. No ulcers or callous. Nails are trimmed  Results for orders placed or performed in visit on 12/15/16  Glucose (CBG)  Result Value Ref Range   POC Glucose 126 (A) 70 - 99 mg/dl   Lab Results  Component Value Date   HGBA1C 5.7 09/17/2016    ASSESSMENT AND PLAN: 1. Essential  hypertension -At goal on repeat blood pressure check.  Continue Norvasc and lisinopril/HCTZ - lisinopril-hydrochlorothiazide (ZESTORETIC) 20-12.5 MG tablet; Take 2 tablets by mouth daily.  Dispense: 180 tablet; Refill: 3 - CBC - Comprehensive metabolic panel - Lipid panel  2. Palpitations 3. Dizziness Encourage patient to mention the dizziness with palpitations to the cardiologist on her visit next month.  We will check thyroid level.  If normal she needs to get a Holter. - TSH+T4F+T3Free  4. Prediabetes 5. Obesity (BMI 35.0-39.9 without comorbidity) -Encourage patient to be mindful of portion sizes and to avoid sweets snacks over the upcoming holidays. Continue metformin - Glucose (  CBG)  6. Chest pain, unspecified type Keep follow-up appointment with cardiology to discuss stress test results and next steps.   Patient was given the opportunity to ask questions.  Patient verbalized understanding of the plan and was able to repeat key elements of the plan.   Orders Placed This Encounter  Procedures  . Glucose (CBG)     Requested Prescriptions    No prescriptions requested or ordered in this encounter    F/u in 3 mths Jonah Blue, MD, Jerrel Ivory

## 2016-12-16 LAB — COMPREHENSIVE METABOLIC PANEL
ALBUMIN: 4.3 g/dL (ref 3.5–5.5)
ALK PHOS: 95 IU/L (ref 39–117)
ALT: 14 IU/L (ref 0–32)
AST: 17 IU/L (ref 0–40)
Albumin/Globulin Ratio: 1.3 (ref 1.2–2.2)
BUN / CREAT RATIO: 14 (ref 9–23)
BUN: 12 mg/dL (ref 6–24)
CO2: 25 mmol/L (ref 20–29)
CREATININE: 0.87 mg/dL (ref 0.57–1.00)
Calcium: 10.3 mg/dL — ABNORMAL HIGH (ref 8.7–10.2)
Chloride: 100 mmol/L (ref 96–106)
GFR calc Af Amer: 84 mL/min/{1.73_m2} (ref >59)
GFR calc non Af Amer: 73 mL/min/{1.73_m2} (ref >59)
Globulin, Total: 3.2 g/dL (ref 1.5–4.5)
Glucose: 95 mg/dL (ref 65–99)
Potassium: 3.8 mmol/L (ref 3.5–5.2)
SODIUM: 141 mmol/L (ref 134–144)
Total Protein: 7.5 g/dL (ref 6.0–8.5)

## 2016-12-16 LAB — CBC
HEMATOCRIT: 39.2 % (ref 34.0–46.6)
HEMOGLOBIN: 13.3 g/dL (ref 11.1–15.9)
MCH: 26.5 pg — AB (ref 26.6–33.0)
MCHC: 33.9 g/dL (ref 31.5–35.7)
MCV: 78 fL — AB (ref 79–97)
Platelets: 356 10*3/uL (ref 150–379)
RBC: 5.01 x10E6/uL (ref 3.77–5.28)
RDW: 15.8 % — ABNORMAL HIGH (ref 12.3–15.4)
WBC: 9.8 10*3/uL (ref 3.4–10.8)

## 2016-12-16 LAB — TSH+T4F+T3FREE
FREE T4: 1.28 ng/dL (ref 0.82–1.77)
T3 FREE: 3 pg/mL (ref 2.0–4.4)
TSH: 2.72 u[IU]/mL (ref 0.450–4.500)

## 2016-12-16 LAB — LIPID PANEL
CHOL/HDL RATIO: 3 ratio (ref 0.0–4.4)
CHOLESTEROL TOTAL: 139 mg/dL (ref 100–199)
HDL: 47 mg/dL (ref >39)
LDL CALC: 58 mg/dL (ref 0–99)
Triglycerides: 171 mg/dL — ABNORMAL HIGH (ref 0–149)
VLDL Cholesterol Cal: 34 mg/dL (ref 5–40)

## 2016-12-25 ENCOUNTER — Telehealth: Payer: Self-pay | Admitting: *Deleted

## 2016-12-25 NOTE — Telephone Encounter (Signed)
Patient verified DOB Patient is aware of blood count, kidney, liver function and cholesterol being normal. Patient is also aware of thyroid being normal. No further questions.

## 2016-12-25 NOTE — Telephone Encounter (Signed)
-----   Message from [name], MD sent at 12/16/2016 11:24 AM EST ----- Patient knows that her blood count, kidney and liver function, and cholesterol level look good.  Thyroid level is normal.

## 2016-12-29 ENCOUNTER — Ambulatory Visit (HOSPITAL_COMMUNITY): Payer: Self-pay | Admitting: Licensed Clinical Social Worker

## 2016-12-30 ENCOUNTER — Encounter: Payer: Self-pay | Admitting: Cardiology

## 2017-01-04 NOTE — Progress Notes (Signed)
Cardiology Office Note   Date:  01/05/2017   ID:  [name], DOB 18-Jul-1955, MRN [id number]  PCP:  Marcine Matar, MD  Cardiologist:  Dr. Eden Emms    Chief Complaint  Patient presents with  . Chest Pain    Nuclear study       History of Present Illness: [name] is a 61 y.o. female who presents for follow up of nuc study.  She has epilepsy. [facility] and complained of chest pain, thus referral was placed to cardiology for further evaluation.  Cardiac risk factors include hypertension, family h/o CAD and diabetes.  Her father had coronary artery disease. He had 3 MIs. She states he was young when he had his first MI (14s).  With the exception of low HDL, her lipid profile earlier this year was good.  LDL was 30.  Triglycerides 124.  HDL was low at 43.  Last hemoglobin A1c was 5.8. She is on Metformin.  Her HTN is managed with amlodipine, lisinopril and hydrochlorothiazide.  She takes baby aspirin daily. She denies tobacco use.   She complains of intermittent chest discomfort associated with tightness in the throat and sharp pain down the left arm. First started 3-4 months ago. Occurs several times a week. Mixed typical and atypical features. Substernal pain with radiation to throat and down left arm. Combination of sharp stabbing pain and tightness. Sometimes worse after meals, also worse if trying to walk up an incline. Improves with rest. She has not tried any OTC meds. Symptoms are often prolonged. She had 1 episode that lasted 3.5 hrs.    nuc study  There was a small area on nuclear images that may indicate a small area of possible reduced blood flow. There is a small defect of moderate severity present in the mid anterior and apical anterior location.  Findings consistent with ischemia. Given normal systolic function, cannot rule out breast attenuation artifact. This is a low risk study.    Today she has 2  episodes of chest pain with seizures.  Initially she said no further chest pain.  She had EEG done and HR went up to 168 on this.  30 day event monitor was recommended.  The tachycardia lasted 2 min.  She has some cognitive issues and this leads to poor historian.      Past Medical History:  Diagnosis Date  . Anxiety   . Carpal tunnel syndrome   . Diabetes mellitus without complication (HCC)   . Diabetes mellitus, type II (HCC)   . DYSTONIA 01/04/2007   Qualifier: Diagnosis of  By: Humberto Seals NP, Darl Pikes    . Facial droop 10/09/2013  . Hemorrhoid   . Hypertension   . Major depressive disorder, recurrent episode (HCC) 03/18/2006   Qualifier: Diagnosis of  By: Bradly Bienenstock    . Obesity, unspecified 10/17/2007   Qualifier: Diagnosis of  By: Gerilyn Pilgrim PHD, Jeannie    . Prediabetes 10/09/2013  . Rectal bleeding 07/03/2015  . Seizures (HCC)   . Seizures (HCC) 08/24/2013    Past Surgical History:  Procedure Laterality Date  . ABDOMINAL HYSTERECTOMY    . COLONOSCOPY N/A 07/15/2015   Dr. Darrick Penna: small-mouth diverticula, small non-bleeding internal hemorrhoids, redundant colon   . HERNIA REPAIR    . MYOMECTOMY       Current Outpatient Medications  Medication Sig Dispense Refill  . amLODipine (NORVASC) 10 MG tablet Take 1 tablet (10 mg total) by mouth daily.  90 tablet 3  . aspirin EC 81 MG tablet Take 1 tablet (81 mg total) by mouth daily. 100 tablet 1  . Cholecalciferol (VITAMIN D3) 3000 units TABS Take by mouth.    . fluticasone (FLONASE) 50 MCG/ACT nasal spray Place 2 sprays into both nostrils daily. 16 g 6  . levETIRAcetam (KEPPRA XR) 500 MG 24 hr tablet Take 2 tablets (1,000 mg total) by mouth 2 (two) times daily. 300 tablet 3  . lisinopril-hydrochlorothiazide (ZESTORETIC) 20-12.5 MG tablet Take 2 tablets by mouth daily. 180 tablet 3  . loratadine (CLARITIN) 10 MG tablet TAKE 1 TABLET BY MOUTH DAILY. 30 tablet 2  . metFORMIN (GLUCOPHAGE) 500 MG tablet Take 1 tablet (500 mg total) by mouth  daily with breakfast. 90 tablet 3  . mirtazapine (REMERON) 30 MG tablet Take 1 tablet (30 mg total) by mouth at bedtime. 90 tablet 1  . NITROSTAT 0.4 MG SL tablet PLACE 1 TABLET (0.4 MG TOTAL) UNDER THE TONGUE EVERY 5 (FIVE) MINUTES AS NEEDED FOR CHEST PAIN. 30 tablet 0  . metoprolol succinate (TOPROL-XL) 25 MG 24 hr tablet Take 1 tablet (25 mg total) by mouth daily. Take with or immediately following a meal. 30 tablet 5   No current facility-administered medications for this visit.     Allergies:   Codeine; Shellfish allergy; Opium; Banana; Catfish [fish allergy]; Orange fruit [citrus]; and Tomato    Social History:  The patient  reports that  has never smoked. she has never used smokeless tobacco. She reports that she does not drink alcohol or use drugs.   Family History:  The patient's family history includes Asthma in her mother; Breast cancer in her cousin; Cancer in her sister; Diabetes in her maternal uncle; Heart disease in her father; Hypertension in her father; Stroke in her father.    ROS:  General:no colds or fevers, no weight changes Skin:no rashes or ulcers HEENT:no blurred vision, no congestion CV:see HPI PUL:see HPI GI:no diarrhea constipation or melena, no indigestion GU:no hematuria, no dysuria MS:no joint pain, no claudication Neuro:no syncope, no lightheadedness, + neuropathy, + seizures Endo:+ diabetes, no thyroid disease  Wt Readings from Last 3 Encounters:  01/05/17 214 lb 12.8 oz (97.4 kg)  12/15/16 213 lb (96.6 kg)  11/17/16 208 lb (94.3 kg)     PHYSICAL EXAM: VS:  BP 128/62   Pulse 84   Resp 16   Ht 5\' 2"  (1.575 m)   Wt 214 lb 12.8 oz (97.4 kg)   BMI 39.29 kg/m  , BMI Body mass index is 39.29 kg/m. General:Pleasant affect, NAD Skin:Warm and dry, brisk capillary refill HEENT:normocephalic, sclera clear, mucus membranes moist Neck:supple, no JVD, no bruits  Heart:S1S2 RRR without murmur, gallup, rub or click Lungs:clear without rales, rhonchi, or  wheezes ZOX:WRUEAbd:soft, non tender, + BS, do not palpate liver spleen or masses Ext:no lower ext edema, 2+ pedal pulses, 2+ radial pulses Neuro:alert and oriented X 3, MAE, follows commands, + facial symmetry    EKG:  EKG is NOT ordered today.    Recent Labs: 12/15/2016: ALT 14; BUN 12; Creatinine, Ser 0.87; Hemoglobin 13.3; Platelets 356; Potassium 3.8; Sodium 141; TSH 2.720    Lipid Panel    Component Value Date/Time   CHOL 139 12/15/2016 1151   TRIG 171 (H) 12/15/2016 1151   HDL 47 12/15/2016 1151   CHOLHDL 3.0 12/15/2016 1151   CHOLHDL 2.3 02/18/2016 0948   VLDL 25 02/18/2016 0948   LDLCALC 58 12/15/2016 1151  Other studies Reviewed: Additional studies/ records that were reviewed today include: .  12/01/16 Nuc study Study Highlights    Nuclear stress EF: 66%.  There was no ST segment deviation noted during stress.  Defect 1: There is a small defect of moderate severity present in the mid anterior and apical anterior location.  Findings consistent with ischemia. Given normal systolic function, cannot rule out breast attenuation artifact.  This is a low risk study.  The left ventricular ejection fraction is hyperdynamic (>65%).      ASSESSMENT AND PLAN:  1.  Chest pain, pt is poor historian but nuc study is low risk.  Discussed with Dr. Katrinka BlazingSmith DOD and will add BB for now. Pt is agreeable  Follow up with Dr. Eden EmmsNishan post event monitor.   2.  Tachycardia with EEG, will have pt wear 30 day event monitor to evaluate.  But BB may control as well.  3.  Hx seizures Epilepsy on Keppra  4.  HTN controlled followed by PCP  5.  DM followed by PCP   Current medicines are reviewed with the patient today.  The patient Has no concerns regarding medicines.  The following changes have been made:  See above Labs/ tests ordered today include:see above  Disposition:   FU:  see above  Signed, Nada BoozerLaura Zuma Hust, NP  01/05/2017 7:20 PM    [facility]  [facility] [address], [place], [place]  [address]/ [address] [place], [place] Phone: [phone]; Fax: [phone]  [phone]

## 2017-01-05 ENCOUNTER — Encounter (INDEPENDENT_AMBULATORY_CARE_PROVIDER_SITE_OTHER): Payer: Self-pay

## 2017-01-05 ENCOUNTER — Ambulatory Visit: Payer: Medicaid Other | Admitting: Cardiology

## 2017-01-05 ENCOUNTER — Encounter: Payer: Self-pay | Admitting: Cardiology

## 2017-01-05 VITALS — BP 128/62 | HR 84 | Resp 16 | Ht 62.0 in | Wt 214.8 lb

## 2017-01-05 DIAGNOSIS — R0789 Other chest pain: Secondary | ICD-10-CM | POA: Diagnosis not present

## 2017-01-05 DIAGNOSIS — I10 Essential (primary) hypertension: Secondary | ICD-10-CM | POA: Diagnosis not present

## 2017-01-05 DIAGNOSIS — E118 Type 2 diabetes mellitus with unspecified complications: Secondary | ICD-10-CM

## 2017-01-05 DIAGNOSIS — R000 Tachycardia, unspecified: Secondary | ICD-10-CM

## 2017-01-05 MED ORDER — METOPROLOL SUCCINATE ER 25 MG PO TB24: 25.0000 mg | ORAL_TABLET | Freq: Every day | ORAL | 5 refills | Status: DC

## 2017-01-05 NOTE — Patient Instructions (Addendum)
Medication Instructions: Your physician has recommended you make the following change in your medication:  -1) START Metoprolol Succinate (Toprol XL) 25 mg - Take 1 tablet (25 mg) by mouth daily - NEW RX SENT   Labwork: None Ordered  Procedures/Testing: Your physician has recommended that you wear a 30 day event monitor. Event monitors are medical devices that record the heart's electrical activity. Doctors most often us these monitors to diagnose arrhythmias. Arrhythmias are problems with the speed or rhythm of the heartbeat. The monitor is a small, portable device. You can wear one while you do your normal daily activities. This is usually used to diagnose what is causing palpitations/syncope (passing out).  Follow-Up: Your physician recommends that you schedule a follow-up appointment with Dr. Eden EmmsNishan after your monitor.  If you need a refill on your cardiac medications before your next appointment, please call your pharmacy.

## 2017-01-14 ENCOUNTER — Ambulatory Visit (HOSPITAL_COMMUNITY): Payer: Self-pay | Admitting: Licensed Clinical Social Worker

## 2017-01-21 MED FILL — LISINOPRIL-HCTZ 20-12.5 MG: 20-12.5 | 30 days supply | Qty: 60 | Fill #1

## 2017-01-21 MED FILL — METOPROLOL SUCC ER 25 MG TA: 25 | 30 days supply | Qty: 30 | Fill #0

## 2017-01-21 MED FILL — LEVETIRACETAM ER 500 MG TAB: 500 | 30 days supply | Qty: 120 | Fill #4

## 2017-01-21 MED FILL — metFORMIN HCL 500 MG TABS: 500 | 30 days supply | Qty: 30 | Fill #9

## 2017-01-21 MED FILL — AMLODIPINE BESYLATE 10 MG T: 10 | 30 days supply | Qty: 30 | Fill #10

## 2017-01-21 MED FILL — FLUTICASONE PROP 50 MCG SPR: 50 | 30 days supply | Qty: 16 | Fill #4

## 2017-01-26 ENCOUNTER — Ambulatory Visit (INDEPENDENT_AMBULATORY_CARE_PROVIDER_SITE_OTHER): Payer: Medicaid Other | Admitting: Licensed Clinical Social Worker

## 2017-01-26 ENCOUNTER — Encounter (HOSPITAL_COMMUNITY): Payer: Self-pay | Admitting: Licensed Clinical Social Worker

## 2017-01-26 DIAGNOSIS — F331 Major depressive disorder, recurrent, moderate: Secondary | ICD-10-CM | POA: Diagnosis not present

## 2017-01-26 NOTE — Progress Notes (Signed)
THERAPIST PROGRESS NOTE  Session Time: 10:10-11am  Participation Level: Active  Behavioral Response: CasualAlert/anxious  Type of Therapy: Individual Therapy  Treatment Goals addressed: Coping  Interventions: CBT  Summary: Ronnald RampRenee Poncedeleon is a 62 y.o. female who presents for her individual counseling session.Pt discussed her psychiatric symptoms and current life events. Pt has had 2 seizures along with chest pain. She has been referred to a cardiologist for further testing. Pt was concerned about her health probs. Processed this with pt.by asking open ended questions. Pt's daughter is acting good again and their relationship is better. Pt discussed her upcoming trips she will be accompanying her family. Validated pt on her stepping outside her box and have ing new experiences. Pt talked a little today about her bulling experiences in middle school. This was a painful topic for her. Asked open ended questions using empathic reflection with pt. Suggested to pt to continue using her self soothing techniques to assist with stress.       Suicidal/Homicidal: Nowithout intent/plan  Therapist Response: Assessed pt's current functioning and reviewed progress. Assisted pt with stressors, new experiences, health issues. Assisted pt in processing her management of stressors.  Plan: Return again in 1 month.  Diagnosis:      Axis I: Major Depressive Disorder, recurrent episode moderate                              Roslynn Holte S, LCAS 01/26/17

## 2017-01-28 ENCOUNTER — Other Ambulatory Visit: Payer: Self-pay

## 2017-01-28 MED ORDER — LORATADINE 10 MG PO TABS: 10.0000 mg | ORAL_TABLET | Freq: Every day | ORAL | 2 refills | Status: DC

## 2017-02-02 ENCOUNTER — Ambulatory Visit (INDEPENDENT_AMBULATORY_CARE_PROVIDER_SITE_OTHER): Payer: Medicaid Other

## 2017-02-02 DIAGNOSIS — R000 Tachycardia, unspecified: Secondary | ICD-10-CM | POA: Diagnosis not present

## 2017-02-10 ENCOUNTER — Ambulatory Visit: Payer: Medicaid Other | Admitting: Neurology

## 2017-02-18 ENCOUNTER — Ambulatory Visit: Payer: Self-pay

## 2017-02-18 ENCOUNTER — Ambulatory Visit (INDEPENDENT_AMBULATORY_CARE_PROVIDER_SITE_OTHER): Payer: Medicaid Other | Admitting: Family Medicine

## 2017-02-18 ENCOUNTER — Other Ambulatory Visit: Payer: Self-pay

## 2017-02-18 VITALS — BP 138/80 | HR 77 | Temp 98.3°F | Wt 214.0 lb

## 2017-02-18 DIAGNOSIS — B351 Tinea unguium: Secondary | ICD-10-CM

## 2017-02-18 MED ORDER — TERBINAFINE HCL 250 MG PO TABS: 250.0000 mg | ORAL_TABLET | Freq: Every day | ORAL | 0 refills | Status: DC

## 2017-02-18 NOTE — Progress Notes (Signed)
Subjective:    Patient ID: [name], female    DOB: [birth year], 62 y.o.   MRN: [id number]   CC: Onychomycosis  HPI: Patient is a 62 yo female who presents today complaining of discoloration of her toes on both feet. Patient reports that she noticed changes in her toenails about a year ago and thought that it was related to her diabetes. Patient states that she has used Vicks Vaporub on her toenails with minimal improvement. Patient denies ay trauma or prior history. Patient has history of neuropathy and is followed by a podiatrist for her diabetic foot care. Patient denies any drainage, bleeding.  Smoking status reviewed   ROS: all other systems were reviewed and are negative other than in the HPI   Past Medical History:  Diagnosis Date  . Anxiety   . Carpal tunnel syndrome   . Diabetes mellitus without complication (HCC)   . Diabetes mellitus, type II (HCC)   . DYSTONIA 01/04/2007   Qualifier: Diagnosis of  By: Humberto Seals NP, Darl Pikes    . Facial droop 10/09/2013  . Hemorrhoid   . Hypertension   . Major depressive disorder, recurrent episode (HCC) 03/18/2006   Qualifier: Diagnosis of  By: Bradly Bienenstock    . Obesity, unspecified 10/17/2007   Qualifier: Diagnosis of  By: Gerilyn Pilgrim PHD, Jeannie    . Prediabetes 10/09/2013  . Rectal bleeding 07/03/2015  . Seizures (HCC)   . Seizures (HCC) 08/24/2013    Past Surgical History:  Procedure Laterality Date  . ABDOMINAL HYSTERECTOMY    . COLONOSCOPY N/A 07/15/2015   Dr. Darrick Penna: small-mouth diverticula, small non-bleeding internal hemorrhoids, redundant colon   . HERNIA REPAIR    . MYOMECTOMY      Past medical history, surgical, family, and social history reviewed and updated in the EMR as appropriate.  Objective:  BP 138/80   Pulse 77   Temp 98.3 F (36.8 C) (Oral)   Wt 214 lb (97.1 kg)   SpO2 97%   BMI 39.14 kg/m   Vitals and nursing note reviewed  General: NAD, pleasant, able to participate in exam Cardiac: RRR,  normal heart sounds, no murmurs. 2+ radial and PT pulses bilaterally Respiratory: CTAB, normal effort, No wheezes, rales or rhonchi Abdomen: soft, nontender, nondistended, no hepatic or splenomegaly, +BS Extremities: Bilateral hyperkeratotic, dark toenails noted on exam..  Skin: warm and dry, no rashes noted Neuro: alert and oriented x4, no focal deficits Psych: Normal affect and mood       Assessment & Plan:   PRE-OP DIAGNOSIS: onychomycosis POST-OP DIAGNOSIS: Same  PROCEDURE: toenail clipping Performing Physician: Tyia Binford Supervising Physician (if applicable): Eniola  PROCEDURE:  The patient is placed in the supine position, with the knees flexed (foot flat on the table) or extended (foot hanging off the end of the table).  A nail elevator was slid under the cuticle to separate the nail plate from the overlying proximal nail fold.  The lateral 20-30% of the nail plate was cut free using bandage scissors  If matrictectomy was performed, electrocautery ablation was used to destroy the nail-forming matrix beneath the area where the nail plate has been removed.  Followup: The patient tolerated the procedure well without complications. Standard post-procedure care is explained and return precautions are given.  Plan Exam findings suggestive of onychomycosis. Nails clipping obtained for biopsy and diagnosis. Given appearance and history started patient on oral agent for 4 weeks. LFTs are within normal limits will return in a month  for repeat labs and reassessement. Will contact patient with result of the biopsy. --Start patient on terbinafine 250 mg daily for 30 days. Optimal duration of treatment is 6-12 weeks.

## 2017-02-18 NOTE — Patient Instructions (Signed)

## 2017-02-23 MED FILL — LISINOPRIL-HCTZ 20-12.5 MG: 20-12.5 | 30 days supply | Qty: 60 | Fill #2

## 2017-02-23 MED FILL — METOPROLOL SUCC ER 25 MG TA: 25 | 30 days supply | Qty: 30 | Fill #1

## 2017-02-23 MED FILL — metFORMIN HCL 500 MG TABS: 500 | 30 days supply | Qty: 30 | Fill #0

## 2017-02-23 MED FILL — LEVETIRACETAM ER 500 MG TAB: 500 | 30 days supply | Qty: 120 | Fill #5

## 2017-02-23 MED FILL — AMLODIPINE BESYLATE 10 MG T: 10 | 30 days supply | Qty: 30 | Fill #0

## 2017-02-23 MED FILL — TERBINAFINE HCL 250 MG TABS: 250 | 30 days supply | Qty: 30 | Fill #0

## 2017-02-24 MED FILL — FLUTICASONE PROP 50 MCG SPR: 50 | 30 days supply | Qty: 16 | Fill #5

## 2017-03-01 ENCOUNTER — Telehealth: Payer: Self-pay | Admitting: Family Medicine

## 2017-03-01 NOTE — Telephone Encounter (Signed)
Patient pick-up her call and hung up.  I called back multiple times but I was transferred to her voicemail.  HIPPA compliant call back message left.    Note: Till date toenail fungal culture has been negative for fungus. Given PMX of Psoriasis on her problem list, this could potentially cause nail discoloration; although atypical presentation. She may also still have onychomycosis despite neg culture (final report pending).   I will recommend completion of 4 weeks Lamisil and reassess for response. If there is good response to treatment, may complete full course of treatment, otherwise, will recommend discontinuing it.  Will forward message to PCP to follow-up with patient.

## 2017-03-11 LAB — FUNGUS CULTURE W SMEAR

## 2017-03-22 ENCOUNTER — Other Ambulatory Visit: Payer: Self-pay | Admitting: Internal Medicine

## 2017-03-22 MED FILL — METOPROLOL SUCCINATE ER 25: 25 | 30 days supply | Qty: 30 | Fill #2

## 2017-03-22 MED FILL — metFORMIN HCL 500 MG TABS: 500 | 30 days supply | Qty: 30 | Fill #1

## 2017-03-22 MED FILL — FLUTICASONE PROP 50 MCG SPR: 50 | 30 days supply | Qty: 16 | Fill #6

## 2017-03-22 MED FILL — AMLODIPINE BESYLATE 10 MG T: 10 | 30 days supply | Qty: 30 | Fill #1

## 2017-03-22 MED FILL — LEVETIRACETAM ER 500 MG TAB: 500 | 30 days supply | Qty: 120 | Fill #6

## 2017-03-23 ENCOUNTER — Ambulatory Visit: Payer: Medicaid Other | Attending: Internal Medicine | Admitting: Internal Medicine

## 2017-03-23 ENCOUNTER — Ambulatory Visit (HOSPITAL_COMMUNITY): Payer: Self-pay | Admitting: Psychiatry

## 2017-03-23 ENCOUNTER — Encounter: Payer: Self-pay | Admitting: Internal Medicine

## 2017-03-23 VITALS — BP 110/72 | HR 81 | Temp 98.6°F | Resp 16 | Ht 62.0 in | Wt 212.4 lb

## 2017-03-23 DIAGNOSIS — I10 Essential (primary) hypertension: Secondary | ICD-10-CM

## 2017-03-23 DIAGNOSIS — E669 Obesity, unspecified: Secondary | ICD-10-CM

## 2017-03-23 DIAGNOSIS — Z6838 Body mass index (BMI) 38.0-38.9, adult: Secondary | ICD-10-CM | POA: Insufficient documentation

## 2017-03-23 DIAGNOSIS — Z79899 Other long term (current) drug therapy: Secondary | ICD-10-CM | POA: Diagnosis not present

## 2017-03-23 DIAGNOSIS — F633 Trichotillomania: Secondary | ICD-10-CM | POA: Insufficient documentation

## 2017-03-23 DIAGNOSIS — Z7984 Long term (current) use of oral hypoglycemic drugs: Secondary | ICD-10-CM | POA: Diagnosis not present

## 2017-03-23 DIAGNOSIS — F4323 Adjustment disorder with mixed anxiety and depressed mood: Secondary | ICD-10-CM | POA: Diagnosis not present

## 2017-03-23 DIAGNOSIS — K648 Other hemorrhoids: Secondary | ICD-10-CM | POA: Insufficient documentation

## 2017-03-23 DIAGNOSIS — Z91013 Allergy to seafood: Secondary | ICD-10-CM | POA: Insufficient documentation

## 2017-03-23 DIAGNOSIS — Z7982 Long term (current) use of aspirin: Secondary | ICD-10-CM | POA: Insufficient documentation

## 2017-03-23 DIAGNOSIS — Z885 Allergy status to narcotic agent status: Secondary | ICD-10-CM | POA: Insufficient documentation

## 2017-03-23 DIAGNOSIS — H538 Other visual disturbances: Secondary | ICD-10-CM | POA: Diagnosis not present

## 2017-03-23 DIAGNOSIS — R569 Unspecified convulsions: Secondary | ICD-10-CM | POA: Insufficient documentation

## 2017-03-23 DIAGNOSIS — E119 Type 2 diabetes mellitus without complications: Secondary | ICD-10-CM | POA: Diagnosis present

## 2017-03-23 DIAGNOSIS — R7303 Prediabetes: Secondary | ICD-10-CM | POA: Diagnosis not present

## 2017-03-23 LAB — GLUCOSE, POCT (MANUAL RESULT ENTRY): POC GLUCOSE: 117 mg/dL — AB (ref 70–99)

## 2017-03-23 LAB — POCT GLYCOSYLATED HEMOGLOBIN (HGB A1C): Hemoglobin A1C: 5.9

## 2017-03-23 MED FILL — LISINOPRIL-HCTZ 20-12.5 MG: 20-12.5 | 30 days supply | Qty: 60 | Fill #0

## 2017-03-23 NOTE — Patient Instructions (Signed)
Please schedule an eye appointment to have your vision checked as soon as possible.  Please work on trying to get in some form of aerobic exercise 3-4 days a week for 20-30 minutes.  Try to incorporate fresh fruits into your diet.

## 2017-03-23 NOTE — Progress Notes (Signed)
Patient ID: [name], female    DOB: [birth year]  MRN: [id number]  CC: Diabetes and Hypertension   Subjective: [name] is a 62 y.o. female who presents for chronic ds management.  Last seen 11/2016. Her concerns today include:  Pt with hx of depression, HTN, Sz ds and pseudoSZ, pre-DM, trichotillomania, internal hemorrhoids, seasonal allergies  1.  Tachycardia seen on EEG: f/u with cardiology.  30 day event monitor ordered in 01/2017.  Pt has f/u appt later this mth.  I looked at report from event recorder.  Looks like NSR with no abnormal rhythm or pauses.  -no palpitations  2.  Seen recently for onychomycosis.  Nail cx neg for fungus. Given Lamisil for 1 mth.  She feels the toenails look better.  3.  HTN:  Limits salt in foods.  Uses Ms. Dash. Compliant with Norvasc and Metoprolol Eats a lot of veggies but not much fruits. No CP/SOB  4.  Pre-Dm:  Tolerating Metformin. Not getting in much exercise because she helps to care for her grandchildren Some blurred vision in LT eye. Wears rxn lenses.  Last eye exam was 2016. She plans to schedule an eye appointment soon Patient Active Problem List   Diagnosis Date Noted  . Internal hemorrhoid 09/16/2016  . Adjustment disorder with mixed anxiety and depressed mood 12/04/2015  . Primary insomnia 12/04/2015  . Prediabetes 10/09/2013  . Facial droop 10/09/2013  . Seizures (HCC) 08/24/2013  . Obesity, unspecified 10/17/2007  . 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 PES PLANUS, RIGHT 07/20/2007  . DYSTONIA 01/04/2007  . Major depressive disorder, recurrent episode (HCC) 03/18/2006  . Essential hypertension 03/18/2006  . PSORIASIS 03/18/2006     Current Outpatient Medications on File Prior to Visit  Medication Sig Dispense Refill  . amLODipine (NORVASC) 10 MG tablet Take 1 tablet (10 mg total) by mouth daily. 90 tablet 3  . aspirin EC 81 MG tablet Take 1 tablet (81 mg total) by mouth daily. 100 tablet 1  . Cholecalciferol  (VITAMIN D3) 3000 units TABS Take by mouth.    . fluticasone (FLONASE) 50 MCG/ACT nasal spray Place 2 sprays into both nostrils daily. 16 g 6  . levETIRAcetam (KEPPRA XR) 500 MG 24 hr tablet Take 2 tablets (1,000 mg total) by mouth 2 (two) times daily. 300 tablet 3  . lisinopril-hydrochlorothiazide (ZESTORETIC) 20-12.5 MG tablet Take 2 tablets by mouth daily. 180 tablet 3  . loratadine (CLARITIN) 10 MG tablet Take 1 tablet (10 mg total) by mouth daily. 30 tablet 2  . metFORMIN (GLUCOPHAGE) 500 MG tablet Take 1 tablet (500 mg total) by mouth daily with breakfast. 90 tablet 3  . metoprolol succinate (TOPROL-XL) 25 MG 24 hr tablet Take 1 tablet (25 mg total) by mouth daily. Take with or immediately following a meal. 30 tablet 5  . mirtazapine (REMERON) 30 MG tablet Take 1 tablet (30 mg total) by mouth at bedtime. 90 tablet 1  . NITROSTAT 0.4 MG SL tablet PLACE 1 TABLET (0.4 MG TOTAL) UNDER THE TONGUE EVERY 5 (FIVE) MINUTES AS NEEDED FOR CHEST PAIN. 30 tablet 0  . terbinafine (LAMISIL) 250 MG tablet Take 1 tablet (250 mg total) by mouth daily. 30 tablet 0   No current facility-administered medications on file prior to visit.     Allergies  Allergen Reactions  . Codeine Anaphylaxis  . Shellfish Allergy Swelling and Rash    Throat Sweling  . Opium     Interferes with Keppra  . Banana Rash  Cannot eat any while taking KEPPRA  . Catfish [Fish Allergy] Rash  . Orange Fruit [Citrus] Rash    Cannot have while taking KEPPRA  . Tomato Itching and Rash    Social History   Socioeconomic History  . Marital status: Divorced    Spouse name: Not on file  . Number of children: 1  . Years of education: Not on file  . Highest education level: Not on file  Social Needs  . Financial resource strain: Not on file  . Food insecurity - worry: Not on file  . Food insecurity - inability: Not on file  . Transportation needs - medical: Not on file  . Transportation needs - non-medical: Not on file    Occupational History  . Not on file  Tobacco Use  . Smoking status: Never Smoker  . Smokeless tobacco: Never Used  Substance and Sexual Activity  . Alcohol use: No    Alcohol/week: 0.0 oz  . Drug use: No  . Sexual activity: No  Other Topics Concern  . Not on file  Social History Narrative  . Not on file    Family History  Problem Relation Age of Onset  . Asthma Mother   . Hypertension Father   . Heart disease Father   . Stroke Father   . Cancer Sister        uterus cancer   . Diabetes Maternal Uncle   . Breast cancer Cousin   . Colon cancer Neg Hx     Past Surgical History:  Procedure Laterality Date  . ABDOMINAL HYSTERECTOMY    . COLONOSCOPY N/A 07/15/2015   Dr. Darrick PennaFields: small-mouth diverticula, small non-bleeding internal hemorrhoids, redundant colon   . HERNIA REPAIR    . MYOMECTOMY      ROS: Review of Systems Negative except as stated above PHYSICAL EXAM: BP 110/72   Pulse 81   Temp 98.6 F (37 C) (Oral)   Resp 16   Ht 5\' 2"  (1.575 m)   Wt 212 lb 6.4 oz (96.3 kg)   SpO2 96%   BMI 38.85 kg/m   Wt Readings from Last 3 Encounters:  03/23/17 212 lb 6.4 oz (96.3 kg)  02/18/17 214 lb (97.1 kg)  01/05/17 214 lb 12.8 oz (97.4 kg)    Physical Exam  General appearance - alert, well appearing, and in no distress Mental status - alert, oriented to person, place, and time, normal mood, behavior, speech, dress, motor activity, and thought processes Neck - supple, no significant adenopathy Chest - clear to auscultation, no wheezes, rales or rhonchi, symmetric air entry Heart - normal rate, regular rhythm, normal S1, S2, no murmurs, rubs, clicks or gallops Extremities - peripheral pulses normal, no pedal edema, no clubbing or cyanosis  A1C 5.9/BS 117 ASSESSMENT AND PLAN: 1. Essential hypertension At goal.  Continue current medications. Encouraged her to incorporate fresh fruits into the diet.  2. Obesity (BMI 35.0-39.9 without comorbidity) Weight fairly  stable. Encouraged her to find time to get in some form of aerobic exercise at least 3-4 days a week for 20-30 minutes.  3. Prediabetes To new metformin.  4. Blurred vision Patient to schedule eye appointment.  Patient was given the opportunity to ask questions.  Patient verbalized understanding of the plan and was able to repeat key elements of the plan.   No orders of the defined types were placed in this encounter.    Requested Prescriptions    No prescriptions requested or ordered in this encounter  Return in about 3 months (around 06/23/2017).  Jonah Blue, MD, FACP

## 2017-03-24 ENCOUNTER — Ambulatory Visit (INDEPENDENT_AMBULATORY_CARE_PROVIDER_SITE_OTHER): Payer: Medicaid Other | Admitting: Psychiatry

## 2017-03-24 ENCOUNTER — Encounter (HOSPITAL_COMMUNITY): Payer: Self-pay | Admitting: Psychiatry

## 2017-03-24 DIAGNOSIS — F331 Major depressive disorder, recurrent, moderate: Secondary | ICD-10-CM

## 2017-03-24 DIAGNOSIS — F5101 Primary insomnia: Secondary | ICD-10-CM | POA: Diagnosis not present

## 2017-03-24 MED ORDER — MIRTAZAPINE 30 MG PO TABS: 30.0000 mg | ORAL_TABLET | Freq: Every day | ORAL | 3 refills | Status: DC

## 2017-03-24 MED FILL — MIRTAZAPINE 30 MG TABLET: 30 | 30 days supply | Qty: 30 | Fill #0

## 2017-03-24 NOTE — Progress Notes (Signed)
[facility] MD/PA/NP OP Progress Note  03/24/2017 12:08 PM [name]  MRN:  [id number]  Chief Complaint: Med check HPI: [name] presents for med management.  She is taking Remeron 30 mg nightly, ran out about 2 months ago, and has noticed that her sleep has not been as good, and she is a bit more anxious.  She felt like she was completely stable on Remeron 30 mg and wishes to restart the maintenance dose.  We agreed to follow-up yearly if needed, otherwise she is welcome to have refills every year with her primary care provider given that this is a long-term medicine for her.  Spent time discussing some of her upcoming trips and vacations that she will be taking with her family.  She reports things are going well in her living situation, and she feels that overall her life stressors are much more stable.  Visit Diagnosis:    ICD-10-CM   1. Major depressive disorder, recurrent episode, moderate (HCC) F33.1 mirtazapine (REMERON) 30 MG tablet  2. Primary insomnia F51.01 mirtazapine (REMERON) 30 MG tablet    Past Psychiatric History: See intake H&P for full details. Reviewed, with no updates at this time.   Past Medical History:  Past Medical History:  Diagnosis Date  . Anxiety   . Carpal tunnel syndrome   . Diabetes mellitus without complication (HCC)   . Diabetes mellitus, type II (HCC)   . DYSTONIA 01/04/2007   Qualifier: Diagnosis of  By: Humberto SealsSaxon NP, Darl PikesSusan    . Facial droop 10/09/2013  . Hemorrhoid   . Hypertension   . Major depressive disorder, recurrent episode (HCC) 03/18/2006   Qualifier: Diagnosis of  By: Bradly BienenstockHarrelson, Kathy    . Obesity, unspecified 10/17/2007   Qualifier: Diagnosis of  By: Gerilyn PilgrimSykes PHD, Jeannie    . Prediabetes 10/09/2013  . Rectal bleeding 07/03/2015  . Seizures (HCC)   . Seizures (HCC) 08/24/2013    Past Surgical History:  Procedure Laterality Date  . ABDOMINAL HYSTERECTOMY    . COLONOSCOPY N/A 07/15/2015   Dr. Darrick PennaFields: small-mouth diverticula, small  non-bleeding internal hemorrhoids, redundant colon   . HERNIA REPAIR    . MYOMECTOMY      Family Psychiatric History: See intake H&P for full details. Reviewed, with no updates at this time.   Family History:  Family History  Problem Relation Age of Onset  . Asthma Mother   . Hypertension Father   . Heart disease Father   . Stroke Father   . Cancer Sister        uterus cancer   . Diabetes Maternal Uncle   . Breast cancer Cousin   . Colon cancer Neg Hx     Social History:  Social History   Socioeconomic History  . Marital status: Divorced    Spouse name: None  . Number of children: 1  . Years of education: None  . Highest education level: None  Social Needs  . Financial resource strain: None  . Food insecurity - worry: None  . Food insecurity - inability: None  . Transportation needs - medical: None  . Transportation needs - non-medical: None  Occupational History  . None  Tobacco Use  . Smoking status: Never Smoker  . Smokeless tobacco: Never Used  Substance and Sexual Activity  . Alcohol use: No    Alcohol/week: 0.0 oz  . Drug use: No  . Sexual activity: No  Other Topics Concern  . None  Social History Narrative  . None  Allergies:  Allergies  Allergen Reactions  . Codeine Anaphylaxis  . Shellfish Allergy Swelling and Rash    Throat Sweling  . Opium     Interferes with Keppra  . Banana Rash    Cannot eat any while taking KEPPRA  . Catfish [Fish Allergy] Rash  . Orange Fruit [Citrus] Rash    Cannot have while taking KEPPRA  . Tomato Itching and Rash    Metabolic Disorder Labs: Lab Results  Component Value Date   HGBA1C 5.9 03/23/2017   No results found for: PROLACTIN Lab Results  Component Value Date   CHOL 139 12/15/2016   TRIG 171 (H) 12/15/2016   HDL 47 12/15/2016   CHOLHDL 3.0 12/15/2016   VLDL 25 02/18/2016   LDLCALC 58 12/15/2016   LDLCALC 30 02/18/2016   Lab Results  Component Value Date   TSH 2.720 12/15/2016   TSH 1.60  02/18/2016    Therapeutic Level Labs: No results found for: LITHIUM No results found for: VALPROATE No components found for:  CBMZ  Current Medications: Current Outpatient Medications  Medication Sig Dispense Refill  . amLODipine (NORVASC) 10 MG tablet Take 1 tablet (10 mg total) by mouth daily. 90 tablet 3  . aspirin EC 81 MG tablet Take 1 tablet (81 mg total) by mouth daily. 100 tablet 1  . Cholecalciferol (VITAMIN D3) 3000 units TABS Take by mouth.    . fluticasone (FLONASE) 50 MCG/ACT nasal spray Place 2 sprays into both nostrils daily. 16 g 6  . levETIRAcetam (KEPPRA XR) 500 MG 24 hr tablet Take 2 tablets (1,000 mg total) by mouth 2 (two) times daily. 300 tablet 3  . lisinopril-hydrochlorothiazide (ZESTORETIC) 20-12.5 MG tablet Take 2 tablets by mouth daily. 180 tablet 3  . loratadine (CLARITIN) 10 MG tablet Take 1 tablet (10 mg total) by mouth daily. 30 tablet 2  . metFORMIN (GLUCOPHAGE) 500 MG tablet Take 1 tablet (500 mg total) by mouth daily with breakfast. 90 tablet 3  . metoprolol succinate (TOPROL-XL) 25 MG 24 hr tablet Take 1 tablet (25 mg total) by mouth daily. Take with or immediately following a meal. 30 tablet 5  . mirtazapine (REMERON) 30 MG tablet Take 1 tablet (30 mg total) by mouth at bedtime. 90 tablet 3  . NITROSTAT 0.4 MG SL tablet PLACE 1 TABLET (0.4 MG TOTAL) UNDER THE TONGUE EVERY 5 (FIVE) MINUTES AS NEEDED FOR CHEST PAIN. 30 tablet 0  . terbinafine (LAMISIL) 250 MG tablet Take 1 tablet (250 mg total) by mouth daily. 30 tablet 0   No current facility-administered medications for this visit.      Musculoskeletal: Strength & Muscle Tone: within normal limits Gait & Station: normal Patient leans: N/A  Psychiatric Specialty Exam: ROS  Blood pressure 132/74, pulse 84, height 5\' 2"  (1.575 m), weight 212 lb (96.2 kg).Body mass index is 38.78 kg/m.  General Appearance: Casual and Well Groomed  Eye Contact:  Fair  Speech:  Clear and Coherent and Normal Rate   Volume:  Normal  Mood:  Euthymic  Affect:  Appropriate and Congruent  Thought Process:  Goal Directed and Descriptions of Associations: Intact  Orientation:  Full (Time, Place, and Person)  Thought Content: Logical   Suicidal Thoughts:  No  Homicidal Thoughts:  No  Memory:  Immediate;   Good  Judgement:  Good  Insight:  Good  Psychomotor Activity:  Normal  Concentration:  Concentration: Good  Recall:  Good  Fund of Knowledge: Good  Language: Good  Akathisia:  Negative  Handed:  Right  AIMS (if indicated): not done  Assets:  Communication Skills Desire for Improvement Financial Resources/Insurance Housing Social Support  ADL's:  Intact  Cognition: WNL  Sleep:  Fair   Screenings: GAD-7     Office Visit from 12/15/2016 in [facility] Office Visit from 10/12/2016 in [facility] Office Visit from 01/09/2016 in [facility] Office Visit from 10/15/2015 in [facility] Office Visit from 05/29/2015 in [facility]  Total GAD-7 Score  6  0  0  21  21    PHQ2-9     Office Visit from 12/15/2016 in [facility] Office Visit from 10/12/2016 in [facility] Office Visit from 01/09/2016 in [facility] Office Visit from 10/15/2015 in [facility] Office Visit from 05/29/2015 in [facility]  PHQ-2 Total Score  4  0  0  3  6  PHQ-9 Total Score  7  No data  0  No data  27       Assessment and Plan:  [name] presents for medication management.  Her depression and insomnia were completely stable on Remeron 30 mg.  I have sent in 1 year of refill of this medication, and she can follow-up with her primary care doctor for annual visits, as I anticipate this will be a long-term medication that she will  continue on for maintenance of her mood and insomnia symptoms.  No acute safety issues, she is welcome to follow-up in this clinic on an as-needed basis.  1. Major depressive disorder, recurrent episode, moderate (HCC)   2. Primary insomnia     Status of current problems: stable  Labs Ordered: No orders of the defined types were placed in this encounter.   Plan:  Remeron 30 mg nightly Patient may check in with her PCP for yearly refill of Remeron Follow-up as needed in [facility] clinic  I spent 15 minutes with the patient in direct face-to-face clinical care.  Greater than 50% of this time was spent in counseling and coordination of care with the patient.    Burnard Leigh, MD 03/24/2017, 12:08 PM

## 2017-03-28 NOTE — Progress Notes (Signed)
Cardiology Office Note   Date:  03/30/2017   ID:  [name], DOB 06-15-1955, MRN [id number]  PCP:  Marcine Matar, MD  Cardiologist:  Dr. Eden Emms    No chief complaint on file.     History of Present Illness:  62 y.o. history of seizrues, HTN, family history CAD and DM A1c well controlled 5.8. 3 drug Rx for HTN Non smoker. Seen by PA 01/05/17 with atypical chest pain nuclear study most consistent with breast attenuation 12/01/16 EF 66% Palpitations with f/u event monitor 02/02/17 no significant arrhythmias Some Cognitive isues and history is vague  She had some nice travels with her daughter to [facility] and [place] No chest pain palpitations some functional exertional dyspnea due to weight gain Compliant With meds    Past Medical History:  Diagnosis Date  . Anxiety   . Carpal tunnel syndrome   . Diabetes mellitus without complication (HCC)   . Diabetes mellitus, type II (HCC)   . DYSTONIA 01/04/2007   Qualifier: Diagnosis of  By: Humberto Seals NP, Darl Pikes    . Facial droop 10/09/2013  . Hemorrhoid   . Hypertension   . Major depressive disorder, recurrent episode (HCC) 03/18/2006   Qualifier: Diagnosis of  By: Bradly Bienenstock    . Obesity, unspecified 10/17/2007   Qualifier: Diagnosis of  By: Gerilyn Pilgrim PHD, Jeannie    . Prediabetes 10/09/2013  . Rectal bleeding 07/03/2015  . Seizures (HCC)   . Seizures (HCC) 08/24/2013    Past Surgical History:  Procedure Laterality Date  . ABDOMINAL HYSTERECTOMY    . COLONOSCOPY N/A 07/15/2015   Dr. Darrick Penna: small-mouth diverticula, small non-bleeding internal hemorrhoids, redundant colon   . HERNIA REPAIR    . MYOMECTOMY       Current Outpatient Medications  Medication Sig Dispense Refill  . amLODipine (NORVASC) 10 MG tablet Take 1 tablet (10 mg total) by mouth daily. 90 tablet 3  . aspirin EC 81 MG tablet Take 1 tablet (81 mg total) by mouth daily. 100 tablet 1  . Cholecalciferol (VITAMIN D3) 3000 units TABS Take by  mouth.    . fluticasone (FLONASE) 50 MCG/ACT nasal spray Place 2 sprays into both nostrils daily. 16 g 6  . levETIRAcetam (KEPPRA XR) 500 MG 24 hr tablet Take 2 tablets (1,000 mg total) by mouth 2 (two) times daily. 300 tablet 3  . lisinopril-hydrochlorothiazide (ZESTORETIC) 20-12.5 MG tablet Take 2 tablets by mouth daily. 180 tablet 3  . loratadine (CLARITIN) 10 MG tablet Take 1 tablet (10 mg total) by mouth daily. 30 tablet 2  . metFORMIN (GLUCOPHAGE) 500 MG tablet Take 1 tablet (500 mg total) by mouth daily with breakfast. 90 tablet 3  . metoprolol succinate (TOPROL-XL) 25 MG 24 hr tablet Take 1 tablet (25 mg total) by mouth daily. Take with or immediately following a meal. 30 tablet 5  . mirtazapine (REMERON) 30 MG tablet Take 1 tablet (30 mg total) by mouth at bedtime. 90 tablet 3  . NITROSTAT 0.4 MG SL tablet PLACE 1 TABLET (0.4 MG TOTAL) UNDER THE TONGUE EVERY 5 (FIVE) MINUTES AS NEEDED FOR CHEST PAIN. 30 tablet 0   No current facility-administered medications for this visit.     Allergies:   Codeine; Shellfish allergy; Opium; Banana; Catfish [fish allergy]; Orange fruit [citrus]; and Tomato    Social History:  The patient  reports that  has never smoked. she has never used smokeless tobacco. She reports that she does not drink alcohol or use  drugs.   Family History:  The patient's family history includes Asthma in her mother; Breast cancer in her cousin; Cancer in her sister; Diabetes in her maternal uncle; Heart disease in her father; Hypertension in her father; Stroke in her father.    ROS:  General:no colds or fevers, no weight changes Skin:no rashes or ulcers HEENT:no blurred vision, no congestion CV:see HPI PUL:see HPI GI:no diarrhea constipation or melena, no indigestion GU:no hematuria, no dysuria MS:no joint pain, no claudication Neuro:no syncope, no lightheadedness, + neuropathy, + seizures Endo:+ diabetes, no thyroid disease  Wt Readings from Last 3 Encounters:    03/30/17 212 lb 12.8 oz (96.5 kg)  03/23/17 212 lb 6.4 oz (96.3 kg)  02/18/17 214 lb (97.1 kg)     PHYSICAL EXAM: VS:  BP 118/70   Pulse 80   Ht 5\' 2"  (1.575 m)   Wt 212 lb 12.8 oz (96.5 kg)   BMI 38.92 kg/m  , BMI Body mass index is 38.92 kg/m. Affect appropriate Overweight black female  HEENT: normal Neck supple with no adenopathy JVP normal no bruits no thyromegaly Lungs clear with no wheezing and good diaphragmatic motion Heart:  S1/S2 no murmur, no rub, gallop or click PMI normal Abdomen: benighn, BS positve, no tenderness, no AAA no bruit.  No HSM or HJR Distal pulses intact with no bruits No edema Neuro non-focal Skin warm and dry No muscular weakness     EKG:  11/17/16 SR rate 61 nonspecific ST changes     Recent Labs: 12/15/2016: ALT 14; BUN 12; Creatinine, Ser 0.87; Hemoglobin 13.3; Platelets 356; Potassium 3.8; Sodium 141; TSH 2.720    Lipid Panel    Component Value Date/Time   CHOL 139 12/15/2016 1151   TRIG 171 (H) 12/15/2016 1151   HDL 47 12/15/2016 1151   CHOLHDL 3.0 12/15/2016 1151   CHOLHDL 2.3 02/18/2016 0948   VLDL 25 02/18/2016 0948   LDLCALC 58 12/15/2016 1151       Other studies Reviewed: Additional studies/ records that were reviewed today include: .event monitor 02/02/17   12/01/16 Nuc study Study Highlights    Nuclear stress EF: 66%.  There was no ST segment deviation noted during stress.  Defect 1: There is a small defect of moderate severity present in the mid anterior and apical anterior location.  Findings consistent with ischemia. Given normal systolic function, cannot rule out breast attenuation artifact.  This is a low risk study.  The left ventricular ejection fraction is hyperdynamic (>65%).      ASSESSMENT AND PLAN:  1.  Chest pain,  Atypical . Low risk myovue 12/01/16 on ASA and beta blocker observe   2.  Tachycardia better on beta blocker event monitor 02/02/17 no arrhythmia   3.  Hx seizures  Epilepsy on Keppra  4.  HTN controlled followed by PCP  5.  DM followed by PCP  Will see cardiology PRN   Charlton HawsPeter Nishan

## 2017-03-30 ENCOUNTER — Encounter: Payer: Self-pay | Admitting: Cardiovascular Disease

## 2017-03-30 ENCOUNTER — Ambulatory Visit: Payer: Medicaid Other | Admitting: Cardiovascular Disease

## 2017-03-30 VITALS — BP 118/70 | HR 80 | Ht 62.0 in | Wt 212.8 lb

## 2017-03-30 DIAGNOSIS — R0789 Other chest pain: Secondary | ICD-10-CM | POA: Diagnosis not present

## 2017-03-30 DIAGNOSIS — I10 Essential (primary) hypertension: Secondary | ICD-10-CM | POA: Diagnosis not present

## 2017-03-30 NOTE — Patient Instructions (Signed)
Medication Instructions:  Your physician recommends that you continue on your current medications as directed. Please refer to the Current Medication list given to you today.  Labwork: NONE  Testing/Procedures: NONE  Follow-Up: Your physician wants you to follow-up as needed with  Dr. Nishan.    If you need a refill on your cardiac medications before your next appointment, please call your pharmacy.    

## 2017-04-14 ENCOUNTER — Ambulatory Visit (INDEPENDENT_AMBULATORY_CARE_PROVIDER_SITE_OTHER): Payer: Medicaid Other | Admitting: Licensed Clinical Social Worker

## 2017-04-14 ENCOUNTER — Encounter (HOSPITAL_COMMUNITY): Payer: Self-pay | Admitting: Licensed Clinical Social Worker

## 2017-04-14 DIAGNOSIS — F331 Major depressive disorder, recurrent, moderate: Secondary | ICD-10-CM

## 2017-04-14 NOTE — Progress Notes (Signed)
THERAPIST PROGRESS NOTE  Session Time: 10:10-11am  Participation Level: Active  Behavioral Response: CasualAlert/anxious  Type of Therapy: Individual Therapy  Treatment Goals addressed: Coping  Interventions: CBT  Summary: [name] is a 62 y.o. female who presents for her individual counseling session.Pt discussed her psychiatric symptoms and current life events. Pt has not been to therapy in 2 months. She reports she has done some traveling with her daughter and grandchildren. The trips have been stressful and she has chosen to not continue with the trips. She has discovered her daughter is more interested in her $ than a relationship with pt. This has hurt pt but she has come to the realization her daughter is Marketing executivematerialistic. Asked open ended questions and used empathic reflection. Pt has decided to take a break from her daughter to do more innerspection of her own life. Validated pt on her choices. Pt reports she wants to continue with therapy 1x per month.     Suicidal/Homicidal: Nowithout intent/plan  Therapist Response: Assessed pt's current functioning and reviewed progress. Assisted pt with stressors, relationship with daughter, innerspection. Assisted pt in processing her management of stressors.  Plan: Return again in 1 month.  Diagnosis:      Axis I: Major Depressive Disorder, recurrent episode moderate                              [name] S, LCAS 04/14/17

## 2017-04-15 ENCOUNTER — Ambulatory Visit (INDEPENDENT_AMBULATORY_CARE_PROVIDER_SITE_OTHER): Payer: Medicaid Other | Admitting: Neurology

## 2017-04-15 ENCOUNTER — Encounter: Payer: Self-pay | Admitting: Neurology

## 2017-04-15 VITALS — BP 118/66 | HR 66 | Ht 62.0 in | Wt 211.0 lb

## 2017-04-15 DIAGNOSIS — R569 Unspecified convulsions: Secondary | ICD-10-CM | POA: Diagnosis not present

## 2017-04-15 MED ORDER — LEVETIRACETAM ER 500 MG PO TB24: ORAL_TABLET | ORAL | 3 refills | Status: DC

## 2017-04-15 MED FILL — LEVETIRACETAM ER 500 MG TAB: 500 | 30 days supply | Qty: 90 | Fill #0 | Status: TO

## 2017-04-15 NOTE — Progress Notes (Signed)
NEUROLOGY FOLLOW UP OFFICE NOTE  [name] [id number]  DOB: 1955-12-13  HISTORY OF PRESENT ILLNESS: I had the pleasure of seeing [name] in follow-up in the neurology clinic on 04/15/2017.  The patient was last seen 6 months ago for a diagnosis of seizures. She was having recurrent episodes of loss of consciousness with report of right-sided symptoms initially, but recently symptoms are preceded by pain and discomfort in her extremities (LE>UE). 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 She was started on Keppra 1000mg  BID at [facility] last 2015 for seizures. She had been doing well with no spells for a year, until Dec 2017 when she had a "light one," she was alone at home and fell on the carpet. She states she was aware that she had to be in a fetal position while she was shaking. She did not injure herself. This occurred during the holidays when she was stressed out and emotional, having chest pains daily. She called our office at the beginning of the month to report dizzy spells in August and blacking out for at least 47 minutes. She had a similar episode in July lasting 42 minutes. Both episodes were unwitnessed, she reports she had the shaking and could see herself shaking, and had timed the episodes. She reported that she was switched to generic Levetiracetam, she takes 1 tab 4 times a day, because taking 2 tablets at the same time was too much. She had previously reported that seizures occur during times of stress, and reports that she has been stressed recently with her daughter's schedule. She had a 48-hour EEG which was normal. She reported numbness in her right hand and arm, sharp stomach pain, and dizziness. There were no epileptiform discharges seen during these events. The day she reported the dizziness, she did not push the button, but there was note a a transient significant increase in heart rate from 78 bpm to 168 bpm  for around 2 minutes, she was not on video at that time. She had a myocardial perfusion scan last 11/2016 which showed an EF of 66%, small defect of moderate severity in the mid anterior and apical anterior location consistent with ischemia, but given normal systolic function, could not rule out breast attenuation artifact. She was started on a beta blocker with improvement in tachycardia, 30-day holter with normal sinus rhythm, no arrhythmia. She has been evaluated by Cardiology.   Since her last visit, she reports 2 spells. She had one in January while at the [facility] in [place]. She felt dizzy, she fell to the ground and her daughter pushed her against the wall. She was shaking but was talking and conscious throughout. The episode lasted 4 minutes. Her daughter was holding her hand. She states this happened because she had taken her medications without eating food. She had another one at home last 04/20/17, she was cleaning the bathroom and felt dizzy. She put her hand on the wall to catch herself and got herself to the floor and had shaking for 3 minutes, again awake the entire time. She reports she had been stressed out at that time due to her weight. She has reduced Keppra dose, she takes 1 tab at 3:07AM, 1 tab at noon, then 1 tab at 3-4pm. She reports that 8 others on her father side have epilepsy. She always gets up at 3am to pray, she has been a prayer warrior since childhood. She continues to see psychiatry  and therapy.   HPI: This is a pleasant 62 yo RH woman with a history of hypertension and a diagnosis of seizures. She reports that neurological symptoms started in 2005 when she would have involuntary movements of the right leg. She states she had lost her grandmother and this had an effect on her, and had symptoms since then. She had seen neurologist Dr. Aletha Halim at that time and was diagnosed with focal limb dystomia, told that this may be caused by a chemical imbalance. She was  treated with Botox but did not tolerate it. She reports seeing Dr. Estella Husk from 2006 to 2009, but also at the same time she started having blackouts. She reports that she had a blackout in her doctor's office one time, but instead of being brought to [facility] ER, she was brought to [facility]. She has had blackouts so bad that neighbors would tell her she needed to go to the ER. She usually starts feeling pain in both legs, followed by right-sided shaking, numbness on the left side of her face, throat gets tight, then she falls to the floor. She lives by herself and has woken up on the floor. She reports that she can see herself shaking and would go into a fetal position to "ride it out."   In August 2015, she was brought to the ER due to recent increase in frequency of spells. She reported bilateral numbness and tingling as well as a feeling of discomfort in both upper and lower extremities. She has been told she has convulsions involving her legs. She reported urinary incontinence and tongue soreness. Her BP was very high at that time. 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 No definite gliosis of heterotopic gray matter. Her routine EEG was normal. She was discharged home on Keppra 1000mg  BID which she has been tolerating without side effects. She reports doing well with no similar symptoms until the October 2016. She brings a calendar of her events, She had one last 10/30/14. She reports she had been stressed because her daughter told her that day that she is moving to [place] to what her mother thinks is an unhealthy relationship. She had another episode on 10/30 while walking. On 11/25/14 she had 2 episodes, one lasting 12 minutes, another one lasting an hour. She reports being in the shower, she  grabbed the handle and sat in the tub rocking in a fetal position, which she found helps. She believes stress is causing the recent seizures and talking to a therapist does help her. She states she does not know how to relax. 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 There is no history of febrile convulsions, CNS infections such as meningitis/encephalitis, significant traumatic brain injury, neurosurgical procedures, or family history of seizures.  PAST MEDICAL HISTORY: Past Medical History:  Diagnosis Date  . Anxiety   . Carpal tunnel syndrome   . Diabetes mellitus without complication (HCC)   . Diabetes mellitus, type II (HCC)   . DYSTONIA 01/04/2007   Qualifier: Diagnosis of  By: Humberto Seals NP, Darl Pikes    . Facial droop 10/09/2013  . Hemorrhoid   . Hypertension   . Major depressive disorder, recurrent episode (HCC) 03/18/2006   Qualifier: Diagnosis of  By: Bradly Bienenstock    . Obesity, unspecified 10/17/2007  Qualifier: Diagnosis of  By: Leroy Kennedy, Jeannie    . Prediabetes 10/09/2013  . Rectal bleeding 07/03/2015  . Seizures (HCC)   . Seizures (HCC) 08/24/2013    MEDICATIONS:  Outpatient Encounter Medications as of 04/15/2017  Medication Sig  . amLODipine (NORVASC) 10 MG tablet Take 1 tablet (10 mg total) by mouth daily.  [name] aspirin EC 81 MG tablet Take 1 tablet (81 mg total) by mouth daily.  . Cholecalciferol (VITAMIN D3) 3000 units TABS Take by mouth.  . fluticasone (FLONASE) 50 MCG/ACT nasal spray Place 2 sprays into both nostrils daily.  [name] levETIRAcetam (KEPPRA XR) 500 MG 24 hr tablet Take 2 tablets (1,000 mg total) by mouth 2 (two) times daily.  [name] lisinopril-hydrochlorothiazide (ZESTORETIC) 20-12.5 MG tablet Take 2 tablets by mouth daily.  [name] loratadine (CLARITIN) 10 MG tablet Take 1 tablet (10 mg total) by mouth daily.  . metFORMIN (GLUCOPHAGE) 500 MG tablet Take 1 tablet (500 mg total) by mouth daily with breakfast.  . metoprolol succinate  (TOPROL-XL) 25 MG 24 hr tablet Take 1 tablet (25 mg total) by mouth daily. Take with or immediately following a meal.  . mirtazapine (REMERON) 30 MG tablet Take 1 tablet (30 mg total) by mouth at bedtime.  [name] NITROSTAT 0.4 MG SL tablet PLACE 1 TABLET (0.4 MG TOTAL) UNDER THE TONGUE EVERY 5 (FIVE) MINUTES AS NEEDED FOR CHEST PAIN.   No facility-administered encounter medications on file as of 04/15/2017.     ALLERGIES: Allergies  Allergen Reactions  . Codeine Anaphylaxis  . Shellfish Allergy Swelling and Rash    Throat Sweling  . Opium     Interferes with Keppra  . Banana Rash    Cannot eat any while taking KEPPRA  . Catfish [Fish Allergy] Rash  . Orange Fruit [Citrus] Rash    Cannot have while taking KEPPRA  . Tomato Itching and Rash    FAMILY HISTORY: Family History  Problem Relation Age of Onset  . Asthma Mother   . Hypertension Father   . Heart disease Father   . Stroke Father   . Cancer Sister        uterus cancer   . Diabetes Maternal Uncle   . Breast cancer Cousin   . Colon cancer Neg Hx     SOCIAL HISTORY: Social History   Socioeconomic History  . Marital status: Divorced    Spouse name: Not on file  . Number of children: 1  . Years of education: Not on file  . Highest education level: Not on file  Occupational History  . Not on file  Social Needs  . Financial resource strain: Not on file  . Food insecurity:    Worry: Not on file    Inability: Not on file  . Transportation needs:    Medical: Not on file    Non-medical: Not on file  Tobacco Use  . Smoking status: Never Smoker  . Smokeless tobacco: Never Used  Substance and Sexual Activity  . Alcohol use: No    Alcohol/week: 0.0 oz  . Drug use: No  . Sexual activity: Never  Lifestyle  . Physical activity:    Days per week: Not on file    Minutes per session: Not on file  . Stress: Not on file  Relationships  . Social connections:    Talks on phone: Not on file    Gets together: Not on file     Attends religious service: Not on file    Active  member of club or organization: Not on file    Attends meetings of clubs or organizations: Not on file    Relationship status: Not on file  . Intimate partner violence:    Fear of current or ex partner: Not on file    Emotionally abused: Not on file    Physically abused: Not on file    Forced sexual activity: Not on file  Other Topics Concern  . Not on file  Social History Narrative  . Not on file    REVIEW OF SYSTEMS: Constitutional: No fevers, chills, or sweats, no generalized fatigue, change in appetite Eyes: No visual changes, double vision, eye pain Ear, nose and throat: No hearing loss, ear pain, nasal congestion, sore throat Cardiovascular: No chest pain, palpitations Respiratory:  No shortness of breath at rest or with exertion, wheezes GastrointestinaI: No nausea, vomiting, diarrhea, abdominal pain, fecal incontinence Genitourinary:  No dysuria, urinary retention or frequency Musculoskeletal:  No neck pain, back pain Integumentary: No rash, pruritus, skin lesions Neurological: as above Psychiatric: + depression, insomnia, anxiety Endocrine: No palpitations, fatigue, diaphoresis, mood swings, change in appetite, change in weight, increased thirst Hematologic/Lymphatic:  No anemia, purpura, petechiae. Allergic/Immunologic: no itchy/runny eyes, nasal congestion, recent allergic reactions, rashes  PHYSICAL EXAM: Vitals:   04/15/17 1052  BP: 118/66  Pulse: 66  SpO2: 98%   General: No acute distress Head:  Normocephalic/atraumatic Neck: supple, no paraspinal tenderness, full range of motion Heart:  Regular rate and rhythm Lungs:  Clear to auscultation bilaterally Back: No paraspinal tenderness Skin/Extremities: No rash, no edema Neurological Exam: alert and oriented to person, place, and time. No aphasia or dysarthria. Fund of knowledge is appropriate.  Recent and remote memory are intact.  Attention and concentration  are normal.    Able to name objects and repeat phrases. Cranial nerves: Pupils equal, round, reactive to light.  Extraocular movements intact with no nystagmus, right esotropia. Visual fields full. Facial sensation intact. No facial asymmetry. Tongue, uvula, palate midline.  Motor: Bulk and tone normal, muscle strength 5/5 throughout with no pronator drift.  Sensation to light touch intact.  No extinction to double simultaneous stimulation.  Deep tendon reflexes 2+ throughout, toes downgoing.  Finger to nose testing intact.  Gait narrow-based, with right foot tending to out-toe.  Romberg negative.  IMPRESSION: This is a pleasant 62 yo RH woman with a history of hypertension and recurrent episodes of loss of consciousness with report of right-sided symptoms initially, but recently symptoms are preceded by pain and discomfort in her extremities (LE>UE). 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 Her 48-hour EEG was normal, there was an episode of dizziness, she was noted to have tachycardia up to 168bpm with no epileptiform correlate seen. She has been started on a beta-blocker by Cardiology. Continue Keppra XR 500mg  TID. Continue follow-up with psychiatry and therapy. She does not drive. She will follow-up in 6 months or earlier if needed.   Thank you for allowing me to participate in her care.  Please do not hesitate to call for any questions or concerns.  The duration of this appointment visit was 25  minutes of face-to-face time with the patient.  Greater than 50% of this time was spent in counseling, explanation  of diagnosis, planning of further management, and coordination of care.   Patrcia DollyKaren Kenishia Plack, M.D.   CC: Dr. Laural BenesJohnson

## 2017-04-15 NOTE — Patient Instructions (Signed)
1. Continue taking Keppra 500mg  three times a day 2. Continue follow-up with Cardiology if needed 3. Follow-up in 6 months, call for any changes

## 2017-04-16 ENCOUNTER — Ambulatory Visit: Payer: Medicaid Other | Admitting: Neurology

## 2017-04-16 ENCOUNTER — Other Ambulatory Visit: Payer: Self-pay

## 2017-04-16 MED ORDER — FLUTICASONE PROPIONATE 50 MCG/ACT NA SUSP: 2.0000 | Freq: Every day | NASAL | 3 refills | Status: DC

## 2017-04-16 MED FILL — LISINOPRIL-HCTZ 20-12.5 MG: 20-12.5 | 30 days supply | Qty: 60 | Fill #1

## 2017-04-16 MED FILL — FLUTICASONE PROP 50 MCG SPR: 50 | 30 days supply | Qty: 16 | Fill #0

## 2017-04-16 MED FILL — AMLODIPINE BESYLATE 10 MG T: 10 | 30 days supply | Qty: 30 | Fill #2

## 2017-04-16 MED FILL — metFORMIN HCL 500 MG TABS: 500 | 30 days supply | Qty: 30 | Fill #2

## 2017-04-16 MED FILL — METOPROLOL SUCCINATE ER 25: 25 | 30 days supply | Qty: 30 | Fill #3

## 2017-05-13 ENCOUNTER — Encounter: Payer: Self-pay | Admitting: Gastroenterology

## 2017-05-20 ENCOUNTER — Encounter (HOSPITAL_COMMUNITY): Payer: Self-pay | Admitting: Licensed Clinical Social Worker

## 2017-05-20 ENCOUNTER — Ambulatory Visit (INDEPENDENT_AMBULATORY_CARE_PROVIDER_SITE_OTHER): Payer: Medicaid Other | Admitting: Licensed Clinical Social Worker

## 2017-05-20 DIAGNOSIS — F331 Major depressive disorder, recurrent, moderate: Secondary | ICD-10-CM | POA: Diagnosis not present

## 2017-05-20 DIAGNOSIS — G40909 Epilepsy, unspecified, not intractable, without status epilepticus: Secondary | ICD-10-CM | POA: Diagnosis not present

## 2017-05-20 NOTE — Progress Notes (Signed)
THERAPIST PROGRESS NOTE  Session Time: 10:10-11am  Participation Level: Active  Behavioral Response: CasualAlert/anxious  Type of Therapy: Individual Therapy  Treatment Goals addressed: Coping  Interventions: CBT  Summary: [name] is a 62 y.o. female who presents for her individual counseling session.Pt discussed her psychiatric symptoms and current life events. Pt shares she has had several seizures over the last month. Asked open ended questions and used empathic reflection. She continues to see her neurologist who follows her for seizures. She has been overly stressed with her daughter, who apparently continues in unhealthy relationships. Her daughter also gets pt to babysit for her 3 children regularly. This is much too stressful for pt. Discussed with pt coping skills for stressful situations. When she becomes overly stressed this is when she consistently has seizures. Discussed with her how she can support her daughter without becoming overly involved. Continue to suggest to pt the importance of self care.      Suicidal/Homicidal: Nowithout intent/plan  Therapist Response: Assessed pt's current functioning and reviewed progress. Assisted pt with stressors, relationship with daughter, seizures, stressful situations, self care. Assisted pt in processing her management of stressors.  Plan: Return again in 1 month.  Diagnosis:      Axis I: Major Depressive Disorder, recurrent episode moderate                              MACKENZIE,LISBETH S, LCAS 05/20/17

## 2017-06-09 MED FILL — FLUTICASONE PROP 50 MCG SPR: 50 | 30 days supply | Qty: 16 | Fill #1

## 2017-06-09 MED FILL — MIRTAZAPINE 30 MG TABLET: 30 | 30 days supply | Qty: 30 | Fill #1

## 2017-06-09 MED FILL — LISINOPRIL-HCTZ 20-12.5 MG: 20-12.5 | 30 days supply | Qty: 60 | Fill #2

## 2017-06-11 ENCOUNTER — Other Ambulatory Visit: Payer: Self-pay

## 2017-06-11 DIAGNOSIS — E119 Type 2 diabetes mellitus without complications: Secondary | ICD-10-CM

## 2017-06-11 MED ORDER — METFORMIN HCL 500 MG PO TABS: 500.0000 mg | ORAL_TABLET | Freq: Every day | ORAL | 1 refills | Status: DC

## 2017-06-11 MED ORDER — AMLODIPINE BESYLATE 10 MG PO TABS: 10.0000 mg | ORAL_TABLET | Freq: Every day | ORAL | 1 refills | Status: DC

## 2017-06-11 MED FILL — metFORMIN HCL 500 MG TABS: 500 | 30 days supply | Qty: 30 | Fill #0

## 2017-06-11 MED FILL — AMLODIPINE BESYLATE 10 MG T: 10 | 30 days supply | Qty: 30 | Fill #0

## 2017-06-17 DIAGNOSIS — H524 Presbyopia: Secondary | ICD-10-CM | POA: Diagnosis not present

## 2017-06-17 DIAGNOSIS — H2513 Age-related nuclear cataract, bilateral: Secondary | ICD-10-CM | POA: Diagnosis not present

## 2017-06-17 DIAGNOSIS — H5213 Myopia, bilateral: Secondary | ICD-10-CM | POA: Diagnosis not present

## 2017-06-17 DIAGNOSIS — E119 Type 2 diabetes mellitus without complications: Secondary | ICD-10-CM | POA: Diagnosis not present

## 2017-06-17 LAB — HM DIABETES EYE EXAM

## 2017-06-24 ENCOUNTER — Ambulatory Visit: Payer: Self-pay | Admitting: Internal Medicine

## 2017-07-01 ENCOUNTER — Ambulatory Visit (INDEPENDENT_AMBULATORY_CARE_PROVIDER_SITE_OTHER): Payer: Medicaid Other | Admitting: Licensed Clinical Social Worker

## 2017-07-01 ENCOUNTER — Encounter (HOSPITAL_COMMUNITY): Payer: Self-pay | Admitting: Licensed Clinical Social Worker

## 2017-07-01 DIAGNOSIS — F331 Major depressive disorder, recurrent, moderate: Secondary | ICD-10-CM

## 2017-07-01 NOTE — Progress Notes (Signed)
THERAPIST PROGRESS NOTE  Session Time: 10:10-11am  Participation Level: Active  Behavioral Response: CasualAlert/anxious  Type of Therapy: Individual Therapy  Treatment Goals addressed: Coping  Interventions: CBT  Summary: [name] is a 62 y.o. female who presents for her individual counseling session.Pt discussed her psychiatric symptoms and current life events. Pt shares she has had several seizures over the 60 days. Asked if she had contacted her dr and suggested she do so. Pt has also found out that Medicaid will not pay for her Keppra for her seizures. She has 3 months left on her prescription. Suggested that she talk with her neurologist about that as well. Things between she and her daughter have calmed down and they are getting along better again. [place] will be going [place]x 5 weeks to visit with her sister and her family. She is looking to move down there. As previously suggested pt went to Housing and asked for a Section 8 voucher. She can use the voucher for housing in [place]. Asked open ended questions about her move and used empathic reflection.    Suicidal/Homicidal: Nowithout intent/plan  Therapist Response: Assessed pt's current functioning and reviewed progress. Assisted pt with stressors, relationship with daughter, seizures, visiting family in [place], section 8 voucher. Assisted pt in processing her management of stressors.  Plan: Return again in 1 month.  Diagnosis:      Axis I: Major Depressive Disorder, recurrent episode moderate                              MACKENZIE,LISBETH S, LCAS 07/01/17

## 2017-07-28 MED FILL — MIRTAZAPINE 30 MG TABLET: 30 | 30 days supply | Qty: 30 | Fill #2

## 2017-07-28 MED FILL — metFORMIN HCL 500 MG TABS: 500 | 30 days supply | Qty: 30 | Fill #1

## 2017-07-28 MED FILL — AMLODIPINE BESYLATE 10 MG T: 10 | 30 days supply | Qty: 30 | Fill #1

## 2017-07-28 MED FILL — LISINOPRIL-HCTZ 20-12.5 MG: 20-12.5 | 30 days supply | Qty: 60 | Fill #3

## 2017-09-07 ENCOUNTER — Ambulatory Visit (HOSPITAL_COMMUNITY): Payer: Self-pay | Admitting: Licensed Clinical Social Worker

## 2017-09-14 ENCOUNTER — Encounter: Payer: Self-pay | Admitting: Internal Medicine

## 2017-09-21 MED FILL — MIRTAZAPINE 30 MG TABLET: 30 | 30 days supply | Qty: 30 | Fill #3

## 2017-09-21 MED FILL — metFORMIN HCL 500 MG TABS: 500 | 30 days supply | Qty: 30 | Fill #2

## 2017-09-21 MED FILL — LISINOPRIL-HCTZ 20-12.5 MG: 20-12.5 | 30 days supply | Qty: 60 | Fill #4

## 2017-09-21 MED FILL — AMLODIPINE BESYLATE 10 MG T: 10 | 30 days supply | Qty: 30 | Fill #2

## 2017-09-21 MED FILL — LEVETIRACETAM ER 500 MG TAB: 500 | 30 days supply | Qty: 90 | Fill #0

## 2017-10-05 ENCOUNTER — Encounter (HOSPITAL_COMMUNITY): Payer: Self-pay | Admitting: Licensed Clinical Social Worker

## 2017-10-05 ENCOUNTER — Ambulatory Visit (INDEPENDENT_AMBULATORY_CARE_PROVIDER_SITE_OTHER): Payer: Medicaid Other | Admitting: Licensed Clinical Social Worker

## 2017-10-05 DIAGNOSIS — F331 Major depressive disorder, recurrent, moderate: Secondary | ICD-10-CM

## 2017-10-05 NOTE — Progress Notes (Signed)
THERAPIST PROGRESS NOTE  Session Time: 10:10-11am  Participation Level: Active  Behavioral Response: CasualAlert/anxious  Type of Therapy: Individual Therapy  Treatment Goals addressed: Coping  Interventions: CBT  Summary: [name] is a 62 y.o. female who presents for her individual counseling session.Pt discussed her psychiatric symptoms and current life events. Pt has not been to therapy for 3 months. She was visiting relatives. She shared about her times with relatives. Asked open ended questions. Pt shares she continues to have seizures. She continues to be monitored by her neurologist. Pt and her daughter are again at odds. She finally blocked her daughter from her phone. When she argues with her daughter is when pt tends to have a seizure. She is now unable to see her grandchildren. Asked open ended questions. Pt is still on the waiting list for Section 8. She did confirm she can transfer her voucher if she moves to [place] to be near her sister and other family. Role played with pt how to communicate with her daughter. Pt becomes frustrated when her daughter doesn't get her way she becomes angry. Asked open ended questions and used empathic reflection. Pt was [facility].     Suicidal/Homicidal: Nowithout intent/plan  Therapist Response: Assessed pt's current functioning and reviewed progress. Assisted pt with stressors, relationship with daughter, seizures, visiting family in [place], section 8 voucher. Assisted pt in processing her management of stressors.  Plan: Return again in 1 month.  Diagnosis:      Axis I: Major Depressive Disorder, recurrent episode moderate                              Patty Leitzke S, LCAS 10/05/17

## 2017-10-22 ENCOUNTER — Ambulatory Visit: Payer: Medicaid Other | Attending: Internal Medicine | Admitting: Internal Medicine

## 2017-10-22 ENCOUNTER — Encounter: Payer: Self-pay | Admitting: Internal Medicine

## 2017-10-22 ENCOUNTER — Other Ambulatory Visit: Payer: Self-pay | Admitting: Internal Medicine

## 2017-10-22 VITALS — BP 189/96 | HR 67 | Temp 98.7°F | Resp 16 | Ht 62.0 in | Wt 212.6 lb

## 2017-10-22 DIAGNOSIS — Z1239 Encounter for other screening for malignant neoplasm of breast: Secondary | ICD-10-CM

## 2017-10-22 DIAGNOSIS — Z79899 Other long term (current) drug therapy: Secondary | ICD-10-CM | POA: Insufficient documentation

## 2017-10-22 DIAGNOSIS — Z7984 Long term (current) use of oral hypoglycemic drugs: Secondary | ICD-10-CM | POA: Diagnosis not present

## 2017-10-22 DIAGNOSIS — Z823 Family history of stroke: Secondary | ICD-10-CM | POA: Diagnosis not present

## 2017-10-22 DIAGNOSIS — Z6838 Body mass index (BMI) 38.0-38.9, adult: Secondary | ICD-10-CM | POA: Diagnosis not present

## 2017-10-22 DIAGNOSIS — L409 Psoriasis, unspecified: Secondary | ICD-10-CM | POA: Diagnosis not present

## 2017-10-22 DIAGNOSIS — R7303 Prediabetes: Secondary | ICD-10-CM | POA: Diagnosis not present

## 2017-10-22 DIAGNOSIS — I10 Essential (primary) hypertension: Secondary | ICD-10-CM | POA: Diagnosis not present

## 2017-10-22 DIAGNOSIS — Z8249 Family history of ischemic heart disease and other diseases of the circulatory system: Secondary | ICD-10-CM | POA: Diagnosis not present

## 2017-10-22 DIAGNOSIS — Z825 Family history of asthma and other chronic lower respiratory diseases: Secondary | ICD-10-CM | POA: Diagnosis not present

## 2017-10-22 DIAGNOSIS — Z23 Encounter for immunization: Secondary | ICD-10-CM | POA: Diagnosis not present

## 2017-10-22 DIAGNOSIS — Z1231 Encounter for screening mammogram for malignant neoplasm of breast: Secondary | ICD-10-CM

## 2017-10-22 DIAGNOSIS — Z885 Allergy status to narcotic agent status: Secondary | ICD-10-CM | POA: Diagnosis not present

## 2017-10-22 DIAGNOSIS — Z90710 Acquired absence of both cervix and uterus: Secondary | ICD-10-CM | POA: Diagnosis not present

## 2017-10-22 DIAGNOSIS — Z6379 Other stressful life events affecting family and household: Secondary | ICD-10-CM

## 2017-10-22 DIAGNOSIS — E669 Obesity, unspecified: Secondary | ICD-10-CM | POA: Diagnosis not present

## 2017-10-22 DIAGNOSIS — Z91013 Allergy to seafood: Secondary | ICD-10-CM | POA: Insufficient documentation

## 2017-10-22 DIAGNOSIS — Z91018 Allergy to other foods: Secondary | ICD-10-CM | POA: Diagnosis not present

## 2017-10-22 LAB — POCT GLYCOSYLATED HEMOGLOBIN (HGB A1C): Hemoglobin A1C: 5.7 % — AB (ref 4.0–5.6)

## 2017-10-22 LAB — GLUCOSE, POCT (MANUAL RESULT ENTRY): POC Glucose: 95 mg/dl (ref 70–99)

## 2017-10-22 MED FILL — LEVETIRACETAM ER 500 MG TAB: 500 | 30 days supply | Qty: 90 | Fill #1

## 2017-10-22 MED FILL — MIRTAZAPINE 30 MG TABLET: 30 | 30 days supply | Qty: 30 | Fill #4

## 2017-10-22 MED FILL — LISINOPRIL-HCTZ 20-12.5 MG: 20-12.5 | 30 days supply | Qty: 60 | Fill #5

## 2017-10-22 MED FILL — AMLODIPINE BESYLATE 10 MG T: 10 | 30 days supply | Qty: 30 | Fill #3

## 2017-10-22 MED FILL — metFORMIN HCL 500 MG TABS: 500 | 30 days supply | Qty: 30 | Fill #3

## 2017-10-22 NOTE — Progress Notes (Signed)
Patient ID: [name], female    DOB: 19-Jun-1955  MRN: [id number]  CC: chronic ds management  Subjective: [name] is a 62 y.o. female who presents for chronic ds management Her concerns today include:  Pt with hx of depression, HTN, Sz ds and pseudoSZ, pre-DM, trichotillomania, internal hemorrhoids, seasonal allergies  HTN:  BP elev this a.m.  She has not taken meds as yet for the a.m. Compliant with meds.  Tries to limit salt in foods but eating out more. Going back and forth b/w [place] and [place] to help take care of daughter's, [name], children during the day.  This has created a lot of stress for her mentally.  She has to in [place] for several days during the week and comes home on weekends.  She spoke with her daughter about it yesterday.  She reports that her daughter got upset with her and told her that she is being selfish.  Patient has also been discussing the stress that she is experiencing with her therapist. -no device to check BP -had a dizzy spell 9 days ago. Lasted about 30-40 mins.  No further episodes. -no CP/SOB.  No lower extremity edema.  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 She is eating out more because she is away from home. Patient Active Problem List   Diagnosis Date Noted  . Internal hemorrhoid 09/16/2016  . Adjustment disorder with mixed anxiety and depressed mood 12/04/2015  . Primary insomnia 12/04/2015  . Prediabetes 10/09/2013  . Facial droop 10/09/2013  . Seizures (HCC) 08/24/2013  . Obesity, unspecified 10/17/2007  . 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 PES PLANUS, RIGHT 07/20/2007  . DYSTONIA 01/04/2007  . Major depressive disorder, recurrent episode (HCC) 03/18/2006  . Essential hypertension 03/18/2006  . PSORIASIS 03/18/2006     Current Outpatient Medications on File Prior to Visit  Medication Sig Dispense Refill  . amLODipine (NORVASC) 10 MG tablet Take 1 tablet (10 mg total) by mouth  daily. 90 tablet 1  . Cholecalciferol (VITAMIN D3) 3000 units TABS Take by mouth.    . levETIRAcetam (KEPPRA XR) 500 MG 24 hr tablet Take 1 tablet three times a day 270 tablet 3  . lisinopril-hydrochlorothiazide (ZESTORETIC) 20-12.5 MG tablet Take 2 tablets by mouth daily. 180 tablet 3  . metFORMIN (GLUCOPHAGE) 500 MG tablet Take 1 tablet (500 mg total) by mouth daily with breakfast. 90 tablet 1  . mirtazapine (REMERON) 30 MG tablet Take 1 tablet (30 mg total) by mouth at bedtime. 90 tablet 3  . aspirin EC 81 MG tablet Take 1 tablet (81 mg total) by mouth daily. (Patient not taking: Reported on 10/22/2017) 100 tablet 1  . fluticasone (FLONASE) 50 MCG/ACT nasal spray Place 2 sprays into both nostrils daily. (Patient not taking: Reported on 10/22/2017) 16 g 3  . loratadine (CLARITIN) 10 MG tablet Take 1 tablet (10 mg total) by mouth daily. (Patient not taking: Reported on 10/22/2017) 30 tablet 2  . metoprolol succinate (TOPROL-XL) 25 MG 24 hr tablet Take 1 tablet (25 mg total) by mouth daily. Take with or immediately following a meal. 30 tablet 5  . NITROSTAT 0.4 MG SL tablet PLACE 1 TABLET (0.4 MG TOTAL) UNDER THE TONGUE EVERY 5 (FIVE) MINUTES AS NEEDED FOR CHEST PAIN. (Patient not taking: Reported on 10/22/2017) 30 tablet 0   No current facility-administered medications on file prior to visit.     Allergies  Allergen Reactions  . Codeine Anaphylaxis  .  Shellfish Allergy Swelling and Rash    Throat Sweling  . Opium     Interferes with Keppra  . Banana Rash    Cannot eat any while taking KEPPRA  . Catfish [Fish Allergy] Rash  . Orange Fruit [Citrus] Rash    Cannot have while taking KEPPRA  . Tomato Itching and Rash    Social History   Socioeconomic History  . Marital status: Divorced    Spouse name: Not on file  . Number of children: 1  . Years of education: Not on file  . Highest education level: Not on file  Occupational History  . Not on file  Social Needs  . Financial resource  strain: Not on file  . Food insecurity:    Worry: Not on file    Inability: Not on file  . Transportation needs:    Medical: Not on file    Non-medical: Not on file  Tobacco Use  . Smoking status: Never Smoker  . Smokeless tobacco: Never Used  Substance and Sexual Activity  . Alcohol use: No    Alcohol/week: 0.0 standard drinks  . Drug use: No  . Sexual activity: Never  Lifestyle  . Physical activity:    Days per week: Not on file    Minutes per session: Not on file  . Stress: Not on file  Relationships  . Social connections:    Talks on phone: Not on file    Gets together: Not on file    Attends religious service: Not on file    Active member of club or organization: Not on file    Attends meetings of clubs or organizations: Not on file    Relationship status: Not on file  . Intimate partner violence:    Fear of current or ex partner: Not on file    Emotionally abused: Not on file    Physically abused: Not on file    Forced sexual activity: Not on file  Other Topics Concern  . Not on file  Social History Narrative  . Not on file    Family History  Problem Relation Age of Onset  . Asthma Mother   . Hypertension Father   . Heart disease Father   . Stroke Father   . Cancer Sister        uterus cancer   . Diabetes Maternal Uncle   . Breast cancer Cousin   . Colon cancer Neg Hx     Past Surgical History:  Procedure Laterality Date  . ABDOMINAL HYSTERECTOMY    . COLONOSCOPY N/A 07/15/2015   Dr. Darrick Penna: small-mouth diverticula, small non-bleeding internal hemorrhoids, redundant colon   . HERNIA REPAIR    . MYOMECTOMY      ROS: Review of Systems Neg except as above  PHYSICAL EXAM: BP (!) 189/96 (BP Location: Left Arm, Patient Position: Sitting, Cuff Size: Normal)   Pulse 67   Temp 98.7 F (37.1 C) (Oral)   Resp 16   Ht 5\' 2"  (1.575 m)   Wt 212 lb 9.6 oz (96.4 kg)   SpO2 95%   BMI 38.89 kg/m   Wt Readings from Last 3 Encounters:  10/22/17 212 lb 9.6  oz (96.4 kg)  04/15/17 211 lb (95.7 kg)  03/30/17 212 lb 12.8 oz (96.5 kg)   BP 180/108 Physical Exam  General appearance -patient in NAD.  She is alert and oriented. Mental status -patient tearful when talking about her current stressful situation with her daughter  eyes - pupils  equal and reactive, extraocular eye movements intact Mouth - mucous membranes moist, pharynx normal without lesions Neck - supple, no significant adenopathy Breast: No dimpling.  No palpable masses.  No axillary lymphadenopathy Chest - clear to auscultation, no wheezes, rales or rhonchi, symmetric air entry Heart - normal rate, regular rhythm, normal S1, S2, no murmurs, rubs, clicks or gallops Extremities - peripheral pulses normal, no pedal edema, no clubbing or cyanosis  Results for orders placed or performed in visit on 10/22/17  POCT glucose (manual entry)  Result Value Ref Range   POC Glucose 95 70 - 99 mg/dl  POCT glycosylated hemoglobin (Hb A1C)  Result Value Ref Range   Hemoglobin A1C 5.7 (A) 4.0 - 5.6 %   HbA1c POC (<> result, manual entry)     HbA1c, POC (prediabetic range)     HbA1c, POC (controlled diabetic range)      ASSESSMENT AND PLAN: 1. Essential hypertension Not at goal.  She has not taken medicines as yet for today.  I advised that she takes her medicines as soon as she returns home.  Return in 1 to 2 weeks to see our clinical pharmacist for blood pressure recheck. - CBC - Comprehensive metabolic panel - Lipid panel  2. Prediabetes Discussed healthy eating habits. - POCT glucose (manual entry) - POCT glycosylated hemoglobin (Hb A1C) - Microalbumin / creatinine urine ratio  3. Stressful life event affecting family Advised that she speak with her daughter letting her know the impact of the stress is having on her mentally.  She will make a decision whether she will continue to go to [place].  4. Need for influenza vaccination  5. Breast cancer screening - MM Digital  Screening; Future  Patient was given the opportunity to ask questions.  Patient verbalized understanding of the plan and was able to repeat key elements of the plan.   Orders Placed This Encounter  Procedures  . Microalbumin / creatinine urine ratio  . POCT glucose (manual entry)  . POCT glycosylated hemoglobin (Hb A1C)     Requested Prescriptions    No prescriptions requested or ordered in this encounter    No follow-ups on file.  Jonah Blue, MD, FACP

## 2017-10-22 NOTE — Progress Notes (Signed)
Patient is here for a physical.

## 2017-10-22 NOTE — Patient Instructions (Signed)
Please give appointment with clinical pharmacist in 1 to 2 weeks for repeat blood pressure check.

## 2017-10-23 LAB — COMPREHENSIVE METABOLIC PANEL
A/G RATIO: 1.3 (ref 1.2–2.2)
ALT: 12 IU/L (ref 0–32)
AST: 16 IU/L (ref 0–40)
Albumin: 4 g/dL (ref 3.6–4.8)
Alkaline Phosphatase: 116 IU/L (ref 39–117)
BUN/Creatinine Ratio: 13 (ref 12–28)
BUN: 11 mg/dL (ref 8–27)
Bilirubin Total: 0.3 mg/dL (ref 0.0–1.2)
CALCIUM: 9.7 mg/dL (ref 8.7–10.3)
CO2: 25 mmol/L (ref 20–29)
CREATININE: 0.83 mg/dL (ref 0.57–1.00)
Chloride: 102 mmol/L (ref 96–106)
GFR, EST AFRICAN AMERICAN: 89 mL/min/{1.73_m2} (ref >59)
GFR, EST NON AFRICAN AMERICAN: 77 mL/min/{1.73_m2} (ref >59)
GLOBULIN, TOTAL: 3.1 g/dL (ref 1.5–4.5)
Glucose: 85 mg/dL (ref 65–99)
Potassium: 4 mmol/L (ref 3.5–5.2)
SODIUM: 143 mmol/L (ref 134–144)
TOTAL PROTEIN: 7.1 g/dL (ref 6.0–8.5)

## 2017-10-23 LAB — LIPID PANEL
CHOL/HDL RATIO: 2.5 ratio (ref 0.0–4.4)
Cholesterol, Total: 120 mg/dL (ref 100–199)
HDL: 48 mg/dL (ref >39)
LDL CALC: 58 mg/dL (ref 0–99)
TRIGLYCERIDES: 71 mg/dL (ref 0–149)
VLDL Cholesterol Cal: 14 mg/dL (ref 5–40)

## 2017-10-23 LAB — CBC
HEMATOCRIT: 38.2 % (ref 34.0–46.6)
Hemoglobin: 12.3 g/dL (ref 11.1–15.9)
MCH: 25.4 pg — ABNORMAL LOW (ref 26.6–33.0)
MCHC: 32.2 g/dL (ref 31.5–35.7)
MCV: 79 fL (ref 79–97)
Platelets: 361 10*3/uL (ref 150–450)
RBC: 4.85 x10E6/uL (ref 3.77–5.28)
RDW: 15.6 % — ABNORMAL HIGH (ref 12.3–15.4)
WBC: 6.8 10*3/uL (ref 3.4–10.8)

## 2017-10-27 ENCOUNTER — Telehealth: Payer: Self-pay

## 2017-10-27 NOTE — Telephone Encounter (Signed)
Contacted pt to go over lab results pt is aware and doesn't have any questions or concerns 

## 2017-11-02 ENCOUNTER — Encounter: Payer: Self-pay | Admitting: Pharmacist

## 2017-11-02 ENCOUNTER — Encounter: Payer: Self-pay | Admitting: Neurology

## 2017-11-02 ENCOUNTER — Other Ambulatory Visit: Payer: Self-pay

## 2017-11-02 ENCOUNTER — Ambulatory Visit: Payer: Medicaid Other | Attending: Internal Medicine | Admitting: Pharmacist

## 2017-11-02 ENCOUNTER — Ambulatory Visit: Payer: Medicaid Other | Admitting: Neurology

## 2017-11-02 VITALS — BP 136/89

## 2017-11-02 VITALS — BP 142/100 | HR 91 | Ht 61.0 in | Wt 210.0 lb

## 2017-11-02 DIAGNOSIS — I10 Essential (primary) hypertension: Secondary | ICD-10-CM | POA: Insufficient documentation

## 2017-11-02 DIAGNOSIS — R569 Unspecified convulsions: Secondary | ICD-10-CM

## 2017-11-02 DIAGNOSIS — Z79899 Other long term (current) drug therapy: Secondary | ICD-10-CM | POA: Diagnosis not present

## 2017-11-02 MED ORDER — LEVETIRACETAM ER 500 MG PO TB24: ORAL_TABLET | ORAL | 3 refills | Status: DC

## 2017-11-02 MED ORDER — LISINOPRIL 40 MG PO TABS: 40.0000 mg | ORAL_TABLET | Freq: Every day | ORAL | 3 refills | Status: DC

## 2017-11-02 MED ORDER — HYDROCHLOROTHIAZIDE 25 MG PO TABS: 25.0000 mg | ORAL_TABLET | Freq: Every day | ORAL | 3 refills | Status: DC

## 2017-11-02 MED FILL — HYDROCHLOROTHIAZIDE 25 MG T: 25 | 30 days supply | Qty: 30 | Fill #0

## 2017-11-02 MED FILL — LISINOPRIL 40 MG TABLET: 40 | 30 days supply | Qty: 30 | Fill #0

## 2017-11-02 NOTE — Patient Instructions (Signed)
1. Continue Levetiracetam ER 500mg  three times a day 2. Continue with avoiding triggers as much as you can 3. Follow-up in 6 months, call for any changes  Seizure Precautions: 1. If medication has been prescribed for you to prevent seizures, take it exactly as directed.  Do not stop taking the medicine without talking to your doctor first, even if you have not had a seizure in a long time.   2. Avoid activities in which a seizure would cause danger to yourself or to others.  Don't operate dangerous machinery, swim alone, or climb in high or dangerous places, such as on ladders, roofs, or girders.  Do not drive unless your doctor says you may.  3. If you have any warning that you may have a seizure, lay down in a safe place where you can't hurt yourself.    4.  No driving for 6 months from last seizure, as per [facility].   Please refer to the following link on the Epilepsy Foundation of America's website for more information: http://www.epilepsyfoundation.org/answerplace/Social/driving/drivingu.cfm   5.  Maintain good sleep hygiene. Avoid alcohol.  6.  Contact your doctor if you have any problems that may be related to the medicine you are taking.  7.  Call 911 and bring the patient back to the ED if:        A.  The seizure lasts longer than 5 minutes.       B.  The patient doesn't awaken shortly after the seizure  C.  The patient has new problems such as difficulty seeing, speaking or moving  D.  The patient was injured during the seizure  E.  The patient has a temperature over 102 F (39C)  F.  The patient vomited and now is having trouble breathing

## 2017-11-02 NOTE — Patient Instructions (Addendum)
Thank you for coming to see [place] today.   Blood pressure today is improving  Take 1 tablet of lisinopril 40 mg in the morning.   Take 1 tablet of hydrochlorothiazide 25 mg in the morning.   Take 1 tablet of amlodipine 10 mg at night.   Limiting salt and caffeine, as well as exercising as able for at least 30 minutes for 5 days out of the week, can also help you lower your blood pressure.  Take your blood pressure at home if you are able. Please write down these numbers and bring them to your visits.  If you have any questions about medications, please call me [phone].  Franky Macho

## 2017-11-02 NOTE — Progress Notes (Signed)
   S: PCP: Dr. Laural Benes     Patient arrives in good spirts. Presents to the clinic for hypertension management. Patient was referred by Dr. Laural Benes on 10/22/17. BP 189/96 at that visit. Of note, she reported not taking medications that day. She was encouraged to take medications and return to see me.   Denies CP, SOB, blurred vision, or HA. Denies LE edema.   Patient reports adherence with medications. She brings them in today. She has her lisinopril-HCTZ and her amlodipine.  Current BP Medications include:   - Amlodipine 10 mg daily - Lisinopril-HCTZ 20-12.5 mg daily (2 tablets daily). Sometimes takes 1 tablet.  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, corporate); "I'm trying to make small changes" - Denies caffeine Exercise habits include: - Does not exercise (d/t diabetic foot) Family / Social history:  - FH: Cardiac disease, HTN (father) - Tobacco: denies  - Alcohol: denies   O:  L arm after 5 minutes: 136/89, HR 80  Last 3 Office BP readings: BP Readings from Last 3 Encounters:  10/22/17 (!) 189/96  04/15/17 118/66  03/30/17 118/70   BMET    Component Value Date/Time   NA 143 10/22/2017 1037   K 4.0 10/22/2017 1037   CL 102 10/22/2017 1037   CO2 25 10/22/2017 1037   GLUCOSE 85 10/22/2017 1037   GLUCOSE 86 02/18/2016 0948   BUN 11 10/22/2017 1037   CREATININE 0.83 10/22/2017 1037   CREATININE 0.89 02/18/2016 0948   CALCIUM 9.7 10/22/2017 1037   GFRNONAA 77 10/22/2017 1037   GFRNONAA 73 08/17/2014 1013   GFRAA 89 10/22/2017 1037   GFRAA 84 08/17/2014 1013   Renal function: Estimated Creatinine Clearance: 78.1 mL/min (by C-G formula based on SCr of 0.83 mg/dL).  A/P: Hypertension longstanding currently uncontrolled on medications. BP Goal <130/80 mmHg. Patient is adherent with current medications but sometimes take one instead of her prescribed two tablets of Zestoretic daily. After discussion, patient believes she would better remember to take the  lisinopril and HCTZ as separate oral medications. Will have her take these in the morning with amlodipine before bedtime. No dose change for now.  -Continued current anti-hypertensives.  - Zestoretic 20-12.5 mg (2 tablets daily) changed to lisinopril 40 mg and HCTZ 25 mg daily in the morning.  - Continued amlodipine 10 mg. Take before bedtime.  -Counseled on lifestyle modifications for blood pressure control including reduced dietary sodium, increased exercise, adequate sleep  Results reviewed and written information provided. Total time in face-to-face counseling 30 minutes.   F/U Clinic Visit 01/24/18.    Patient seen with: Leanne Chang, PharmD Candidate [employer] of 2021  Butch Penny, PharmD, CPP Clinical Pharmacist [facility] [phone]

## 2017-11-02 NOTE — Progress Notes (Signed)
NEUROLOGY FOLLOW UP OFFICE NOTE  [name] [id number]  DOB: [birth year]  HISTORY OF PRESENT ILLNESS: I had the pleasure of seeing [name] in follow-up in the neurology clinic on 11/02/2017.  The patient was last seen 6 months ago for a diagnosis of seizures. She was having recurrent episodes of loss of consciousness with report of right-sided symptoms initially, but recently symptoms are preceded by pain and discomfort in her extremities (LE>UE). 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 She was started on Keppra at [facility] last 2015 for seizures. She is taking Levetiracetam ER 500mg  TID without side effects. She continues to report spells that now consistently occur when she is stressed out with her daughter. She reports that when she is not under stress, she is not having any episodes. Last month was quite stressful and she had several spells. One time she was "having one of those moments" getting her daughter out of the house to go to work, she had a dizzy spell and fell on the floor and went into the fetal position. She had one this month last 10/3. She is worried about visiting her daughter this week but wants to see her grandchildren. She sees a therapist who is helping her with coping strategies.  History on Initial Assessment 11/2014: This is a pleasant 62 yo RH woman with a history of hypertension and a diagnosis of seizures. She reports that neurological symptoms started in 2005 when she would have involuntary movements of the right leg. She states she had lost her grandmother and this had an effect on her, and had symptoms since then. She had seen neurologist Dr. Aletha Halim at that time and was diagnosed with focal limb dystomia, told that this may be caused by a chemical imbalance. She was treated with Botox but did not tolerate it. She reports seeing Dr. Estella Husk from 2006 to 2009, but also at the same time she  started having blackouts. She reports that she had a blackout in her doctor's office one time, but instead of being brought to [facility] ER, she was brought to [facility]. She has had blackouts so bad that neighbors would tell her she needed to go to the ER. She usually starts feeling pain in both legs, followed by right-sided shaking, numbness on the left side of her face, throat gets tight, then she falls to the floor. She lives by herself and has woken up on the floor. She reports that she can see herself shaking and would go into a fetal position to "ride it out."   In August 2015, she was brought to the ER due to recent increase in frequency of spells. She reported bilateral numbness and tingling as well as a feeling of discomfort in both upper and lower extremities. She has been told she has convulsions involving her legs. She reported urinary incontinence and tongue soreness. Her BP was very high at that time. 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 No definite gliosis of heterotopic gray matter. Her routine EEG was normal. She was discharged home on Keppra 1000mg  BID which she has been tolerating without side effects. She reports doing well with no similar symptoms until the October 2016. She brings a calendar of her events, She had one last 10/30/14. She reports she had been stressed  because her daughter told her that day that she is moving to [place] to what her mother thinks is an unhealthy relationship. She had another episode on 10/30 while walking. On 11/25/14 she had 2 episodes, one lasting 12 minutes, another one lasting an hour. She reports being in the shower, she grabbed the handle and sat in the tub rocking in a fetal position, which she found helps. She believes stress is causing the  recent seizures and talking to a therapist does help her. She states she does not know how to relax. 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 There is no history of febrile convulsions, CNS infections such as meningitis/encephalitis, significant traumatic brain injury, neurosurgical procedures, or family history of seizures.  Diagnostic Data: She had a 48-hour EEG which was normal. She reported numbness in her right hand and arm, sharp stomach pain, and dizziness. There were no epileptiform discharges seen during these events. The day she reported the dizziness, she did not push the button, but there was note a a transient significant increase in heart rate from 78 bpm to 168 bpm for around 2 minutes, she was not on video at that time. She had a myocardial perfusion scan last 11/2016 which showed an EF of 66%, small defect of moderate severity in the mid anterior and apical anterior location consistent with ischemia, but given normal systolic function, could not rule out breast attenuation artifact. She was started on a beta blocker with improvement in tachycardia, 30-day holter with normal sinus rhythm, no arrhythmia. She has been evaluated by Cardiology.   PAST MEDICAL HISTORY: Past Medical History:  Diagnosis Date  . Anxiety   . Carpal tunnel syndrome   . Diabetes mellitus without complication (HCC)   . Diabetes mellitus, type II (HCC)   . DYSTONIA 01/04/2007   Qualifier: Diagnosis of  By: Humberto Seals NP, Darl Pikes    . Facial droop 10/09/2013  . Hemorrhoid   . Hypertension   . Major depressive disorder, recurrent episode (HCC) 03/18/2006   Qualifier: Diagnosis of  By: Bradly Bienenstock    . Obesity, unspecified 10/17/2007   Qualifier: Diagnosis of  By: Gerilyn Pilgrim PHD, Jeannie    . Prediabetes 10/09/2013  . Rectal bleeding 07/03/2015  . Seizures (HCC)   . Seizures (HCC) 08/24/2013    MEDICATIONS:  Outpatient Encounter Medications as of 11/02/2017  Medication Sig  .  amLODipine (NORVASC) 10 MG tablet Take 1 tablet (10 mg total) by mouth daily.  [name] aspirin EC 81 MG tablet Take 1 tablet (81 mg total) by mouth daily. (Patient not taking: Reported on 10/22/2017)  . Cholecalciferol (VITAMIN D3) 3000 units TABS Take by mouth.  . fluticasone (FLONASE) 50 MCG/ACT nasal spray Place 2 sprays into both nostrils daily. (Patient not taking: Reported on 10/22/2017)  . hydrochlorothiazide (HYDRODIURIL) 25 MG tablet Take 1 tablet (25 mg total) by mouth daily.  [name] levETIRAcetam (KEPPRA XR) 500 MG 24 hr tablet Take 1 tablet three times a day  . lisinopril (PRINIVIL,ZESTRIL) 40 MG tablet Take 1 tablet (40 mg total) by mouth daily.  [name] lisinopril-hydrochlorothiazide (ZESTORETIC) 20-12.5 MG tablet Take 2 tablets by mouth daily.  [name] loratadine (CLARITIN) 10 MG tablet Take 1 tablet (10 mg total) by mouth daily. (Patient not taking: Reported on 10/22/2017)  . metFORMIN (GLUCOPHAGE) 500 MG tablet Take 1 tablet (500 mg total) by mouth daily with breakfast.  . mirtazapine (REMERON) 30 MG tablet Take 1 tablet (30 mg total) by mouth  at bedtime.  [name] NITROSTAT 0.4 MG SL tablet PLACE 1 TABLET (0.4 MG TOTAL) UNDER THE TONGUE EVERY 5 (FIVE) MINUTES AS NEEDED FOR CHEST PAIN. (Patient not taking: Reported on 10/22/2017)  . [DISCONTINUED] metoprolol succinate (TOPROL-XL) 25 MG 24 hr tablet Take 1 tablet (25 mg total) by mouth daily. Take with or immediately following a meal.   No facility-administered encounter medications on file as of 11/02/2017.     ALLERGIES: Allergies  Allergen Reactions  . Codeine Anaphylaxis  . Shellfish Allergy Swelling and Rash    Throat Sweling  . Opium     Interferes with Keppra  . Banana Rash    Cannot eat any while taking KEPPRA  . Catfish [Fish Allergy] Rash  . Orange Fruit [Citrus] Rash    Cannot have while taking KEPPRA  . Tomato Itching and Rash    FAMILY HISTORY: Family History  Problem Relation Age of Onset  . Asthma Mother   . Hypertension Father   .  Heart disease Father   . Stroke Father   . Cancer Sister        uterus cancer   . Diabetes Maternal Uncle   . Breast cancer Cousin   . Colon cancer Neg Hx     SOCIAL HISTORY: Social History   Socioeconomic History  . Marital status: Divorced    Spouse name: Not on file  . Number of children: 1  . Years of education: Not on file  . Highest education level: Not on file  Occupational History  . Not on file  Social Needs  . Financial resource strain: Not on file  . Food insecurity:    Worry: Not on file    Inability: Not on file  . Transportation needs:    Medical: Not on file    Non-medical: Not on file  Tobacco Use  . Smoking status: Never Smoker  . Smokeless tobacco: Never Used  Substance and Sexual Activity  . Alcohol use: No    Alcohol/week: 0.0 standard drinks  . Drug use: No  . Sexual activity: Never  Lifestyle  . Physical activity:    Days per week: Not on file    Minutes per session: Not on file  . Stress: Not on file  Relationships  . Social connections:    Talks on phone: Not on file    Gets together: Not on file    Attends religious service: Not on file    Active member of club or organization: Not on file    Attends meetings of clubs or organizations: Not on file    Relationship status: Not on file  . Intimate partner violence:    Fear of current or ex partner: Not on file    Emotionally abused: Not on file    Physically abused: Not on file    Forced sexual activity: Not on file  Other Topics Concern  . Not on file  Social History Narrative  . Not on file    REVIEW OF SYSTEMS: Constitutional: No fevers, chills, or sweats, no generalized fatigue, change in appetite Eyes: No visual changes, double vision, eye pain Ear, nose and throat: No hearing loss, ear pain, nasal congestion, sore throat Cardiovascular: No chest pain, palpitations Respiratory:  No shortness of breath at rest or with exertion, wheezes GastrointestinaI: No nausea, vomiting,  diarrhea, abdominal pain, fecal incontinence Genitourinary:  No dysuria, urinary retention or frequency Musculoskeletal:  No neck pain, back pain Integumentary: No rash, pruritus, skin lesions Neurological: as above  Psychiatric: + depression, insomnia, anxiety Endocrine: No palpitations, fatigue, diaphoresis, mood swings, change in appetite, change in weight, increased thirst Hematologic/Lymphatic:  No anemia, purpura, petechiae. Allergic/Immunologic: no itchy/runny eyes, nasal congestion, recent allergic reactions, rashes  PHYSICAL EXAM: Vitals:   11/02/17 1038  BP: (!) 142/100  Pulse: 91  SpO2: 98%   General: No acute distress Head:  Normocephalic/atraumatic Neck: supple, no paraspinal tenderness, full range of motion Heart:  Regular rate and rhythm Lungs:  Clear to auscultation bilaterally Back: No paraspinal tenderness Skin/Extremities: No rash, no edema Neurological Exam: alert and oriented to person, place, and time. No aphasia or dysarthria. Fund of knowledge is appropriate.  Recent and remote memory are intact.  Attention and concentration are normal.    Able to name objects and repeat phrases. Cranial nerves: Pupils equal, round, reactive to light.  Extraocular movements intact with no nystagmus, right esotropia. Visual fields full. Facial sensation intact. No facial asymmetry. Tongue, uvula, palate midline.  Motor: Bulk and tone normal, muscle strength 5/5 throughout with no pronator drift.  Sensation to light touch intact.  No extinction to double simultaneous stimulation. Finger to nose testing intact.  Gait narrow-based, with right foot tending to out-toe (similar to prior).  Romberg negative.  IMPRESSION: This is a pleasant 62 yo RH woman with a history of hypertension and recurrent episodes of loss of consciousness with report of right-sided symptoms initially, but recently symptoms are preceded by pain and discomfort in her extremities (LE>UE), or provoked by stress with  her daughter. 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 Her 48-hour EEG was normal, there was an episode of dizziness, she was noted to have tachycardia up to 168bpm with no epileptiform correlate seen. Continue Keppra XR 500mg  TID. We discussed avoidance of triggers (stress) and continued working with psychiatry and therapy. She does not drive. She will follow-up in 6 months or earlier if needed.   Thank you for allowing me to participate in her care.  Please do not hesitate to call for any questions or concerns.  The duration of this appointment visit was 20 minutes of face-to-face time with the patient.  Greater than 50% of this time was spent in counseling, explanation of diagnosis, planning of further management, and coordination of care.   Patrcia Dolly, M.D.   CC: Dr. Laural Benes

## 2017-11-09 ENCOUNTER — Ambulatory Visit (HOSPITAL_COMMUNITY): Payer: Self-pay | Admitting: Licensed Clinical Social Worker

## 2017-11-26 ENCOUNTER — Ambulatory Visit
Admission: RE | Admit: 2017-11-26 | Discharge: 2017-11-26 | Disposition: A | Discharge status: Home / Self Care | Payer: Medicaid Other | Source: Ambulatory Visit | Attending: Internal Medicine | Admitting: Internal Medicine

## 2017-11-26 DIAGNOSIS — Z1231 Encounter for screening mammogram for malignant neoplasm of breast: Secondary | ICD-10-CM | POA: Diagnosis not present

## 2017-11-26 MED FILL — AMLODIPINE BESYLATE 10 MG T: 10 | 30 days supply | Qty: 30 | Fill #4

## 2017-11-26 MED FILL — LISINOPRIL 40 MG TABLET: 40 | 30 days supply | Qty: 30 | Fill #1

## 2017-11-26 MED FILL — metFORMIN HCL 500 MG TABS: 500 | 30 days supply | Qty: 30 | Fill #4

## 2017-11-26 MED FILL — LEVETIRACETAM ER 500 MG TAB: 500 | 30 days supply | Qty: 90 | Fill #2

## 2017-11-30 ENCOUNTER — Telehealth: Payer: Self-pay

## 2017-11-30 NOTE — Telephone Encounter (Signed)
Contacted pt to go over MM results pt is aware and doesn't have any questions or concerns  

## 2017-12-07 ENCOUNTER — Encounter (HOSPITAL_COMMUNITY): Payer: Self-pay | Admitting: Licensed Clinical Social Worker

## 2017-12-07 ENCOUNTER — Ambulatory Visit (INDEPENDENT_AMBULATORY_CARE_PROVIDER_SITE_OTHER): Payer: Medicaid Other | Admitting: Licensed Clinical Social Worker

## 2017-12-07 DIAGNOSIS — F331 Major depressive disorder, recurrent, moderate: Secondary | ICD-10-CM | POA: Diagnosis not present

## 2017-12-07 NOTE — Progress Notes (Signed)
THERAPIST PROGRESS NOTE  Session Time: 9:10-10am  Participation Level: Active  Behavioral Response: CasualAlert/anxious  Type of Therapy: Individual Therapy  Treatment Goals addressed:   Interventions: CBT  Summary: Kirsten RampRenee [name] is a 62 y.o. female who presents for her individual counseling session.Pt discussed her psychiatric symptoms and current life events. Pt does not have a psychiatrist so will assist pt in getting a pscyhiatrist here at the clinic. Pt has not been to therapy for 2 months. She missed the bus last month. Currently pt and her daughter are getting along this month but last month she reported to her neurologist that she had several seizures. Pt shared sometimes she forgets to take her seizure medications and when she argues with her daughter it may lead to a seizure. Pt is worried about her grandchildren as her daughter has a temper. Pt has previously been worried and discussed with pt options and choices. Discussed with pt the importance of taking her medications.     Suicidal/Homicidal: Nowithout intent/plan  Therapist Response: Assessed pt's current functioning and reviewed progress. Assisted pt with stressors, relationship with daughter, importance of medication, seizures. Assisted pt in processing her management of stressors.  Plan: Return again in 1 month.  Diagnosis:      Axis I: Major Depressive Disorder, recurrent episode moderate                              MACKENZIE,LISBETH S, LCAS 12/07/17

## 2018-01-04 ENCOUNTER — Encounter (HOSPITAL_COMMUNITY): Payer: Self-pay | Admitting: Licensed Clinical Social Worker

## 2018-01-04 ENCOUNTER — Ambulatory Visit (INDEPENDENT_AMBULATORY_CARE_PROVIDER_SITE_OTHER): Payer: Medicaid Other | Admitting: Licensed Clinical Social Worker

## 2018-01-04 DIAGNOSIS — F331 Major depressive disorder, recurrent, moderate: Secondary | ICD-10-CM | POA: Diagnosis not present
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
## 2018-01-05 MED FILL — LISINOPRIL 40 MG TABLET: 40 | 30 days supply | Qty: 30 | Fill #2

## 2018-01-05 MED FILL — metFORMIN HCL 500 MG TABS: 500 | 30 days supply | Qty: 30 | Fill #5

## 2018-01-05 MED FILL — MIRTAZAPINE 30 MG TABLET: 30 | 30 days supply | Qty: 30 | Fill #5

## 2018-01-05 MED FILL — LEVETIRACETAM ER 500 MG TAB: 500 | 30 days supply | Qty: 90 | Fill #3

## 2018-01-05 MED FILL — AMLODIPINE BESYLATE 10 MG T: 10 | 30 days supply | Qty: 30 | Fill #5

## 2018-01-24 ENCOUNTER — Ambulatory Visit: Payer: Medicaid Other | Attending: Internal Medicine | Admitting: Internal Medicine

## 2018-01-24 ENCOUNTER — Encounter: Payer: Self-pay | Admitting: Internal Medicine

## 2018-01-24 VITALS — BP 133/84 | HR 79 | Temp 98.2°F | Resp 16 | Ht 62.0 in | Wt 215.6 lb

## 2018-01-24 DIAGNOSIS — Z8249 Family history of ischemic heart disease and other diseases of the circulatory system: Secondary | ICD-10-CM | POA: Insufficient documentation

## 2018-01-24 DIAGNOSIS — R569 Unspecified convulsions: Secondary | ICD-10-CM | POA: Diagnosis not present

## 2018-01-24 DIAGNOSIS — Z7982 Long term (current) use of aspirin: Secondary | ICD-10-CM | POA: Insufficient documentation

## 2018-01-24 DIAGNOSIS — Z79899 Other long term (current) drug therapy: Secondary | ICD-10-CM | POA: Insufficient documentation

## 2018-01-24 DIAGNOSIS — L409 Psoriasis, unspecified: Secondary | ICD-10-CM | POA: Insufficient documentation

## 2018-01-24 DIAGNOSIS — I10 Essential (primary) hypertension: Secondary | ICD-10-CM | POA: Insufficient documentation

## 2018-01-24 DIAGNOSIS — Z7951 Long term (current) use of inhaled steroids: Secondary | ICD-10-CM | POA: Insufficient documentation

## 2018-01-24 DIAGNOSIS — Z6839 Body mass index (BMI) 39.0-39.9, adult: Secondary | ICD-10-CM | POA: Insufficient documentation

## 2018-01-24 DIAGNOSIS — Z7984 Long term (current) use of oral hypoglycemic drugs: Secondary | ICD-10-CM | POA: Insufficient documentation

## 2018-01-24 DIAGNOSIS — R7303 Prediabetes: Secondary | ICD-10-CM | POA: Diagnosis not present

## 2018-01-24 DIAGNOSIS — E669 Obesity, unspecified: Secondary | ICD-10-CM | POA: Insufficient documentation

## 2018-01-24 DIAGNOSIS — J069 Acute upper respiratory infection, unspecified: Secondary | ICD-10-CM | POA: Diagnosis not present

## 2018-01-24 DIAGNOSIS — F331 Major depressive disorder, recurrent, moderate: Secondary | ICD-10-CM | POA: Insufficient documentation

## 2018-01-24 LAB — GLUCOSE, POCT (MANUAL RESULT ENTRY): POC Glucose: 100 mg/dl — AB (ref 70–99)

## 2018-01-24 MED ORDER — HYDROCHLOROTHIAZIDE 25 MG PO TABS: 25.0000 mg | ORAL_TABLET | Freq: Every day | ORAL | 3 refills | Status: DC

## 2018-01-24 MED ORDER — BENZONATATE 200 MG PO CAPS: 200.0000 mg | ORAL_CAPSULE | Freq: Three times a day (TID) | ORAL | 0 refills | Status: DC | PRN

## 2018-01-24 MED ORDER — LORATADINE 10 MG PO TABS: 10.0000 mg | ORAL_TABLET | Freq: Every day | ORAL | 2 refills | Status: DC

## 2018-01-24 MED FILL — HYDROCHLOROTHIAZIDE 25 MG T: 25 | 90 days supply | Qty: 90 | Fill #0

## 2018-01-24 MED FILL — LORATADINE 10 MG TABLET: 10 | 30 days supply | Qty: 30 | Fill #0

## 2018-01-24 NOTE — Progress Notes (Signed)
Patient ID: Kirsten RampRenee Horiuchi, female    DOB: 30-Apr-1955  MRN: [id number]  CC: URI; Hypertension; and Diabetes (prediabetes)   Subjective: [name] is a 63 y.o. female who presents for chronic ds management. Her concerns today include:  Pt with hx of depression, HTN, Sz dsand pseudoSZ, pre-DM, trichotillomania, internal hemorrhoids, seasonal allergies  C/o having a cold x 4 days Symptoms include cough productive of yellow phlegm.  Pain in chest with coughing.  Associated with pain in throat, ears and over maxillary sinuses.  No fever.  She is using an over-the-counter medication but does not recall the name.  HTN:  Lisinopril/HCTZ combo changed to its separate component by clinical pharmacist.  Compliant with these and Norvasc.  Did not take HCTZ as yet for the morning. -she tries to limit salt in foods -CP with coughing. No LE edema  Pre-DM:  Last A1C was 5.7 in 10/2017.  Doing okay with eating habits.  She does not drink sodas or juices.  Getting in fresh fruits and veggies - loves apples and blue berry. Not getting in any exercise.  She hopes to do better with this this year. Compliant with Metformin  SZ:  Saw Dr. Karel JarvisAquino since last visit.  Reports compliance with Keppra.  No sz since last visit.  Depression:  Better "as long as [facility]  Morrie Sheldonshley is her daughter.  Reports that her daughter tries to stress her out.  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  Will be seeing a new psychiatrist later this mth  Patient Active Problem List   Diagnosis Date Noted  . Internal hemorrhoid 09/16/2016  . Adjustment disorder with mixed anxiety and depressed mood 12/04/2015  . Primary insomnia 12/04/2015  . Prediabetes 10/09/2013  . Facial droop 10/09/2013  . Seizures (HCC) 08/24/2013  . Obesity, unspecified 10/17/2007  . 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 PES PLANUS, RIGHT 07/20/2007  . DYSTONIA  01/04/2007  . Major depressive disorder, recurrent episode (HCC) 03/18/2006  . Essential hypertension 03/18/2006  . PSORIASIS 03/18/2006     Current Outpatient Medications on File Prior to Visit  Medication Sig Dispense Refill  . amLODipine (NORVASC) 10 MG tablet Take 1 tablet (10 mg total) by mouth daily. 90 tablet 1  . levETIRAcetam (KEPPRA XR) 500 MG 24 hr tablet Take 1 tablet three times a day 270 tablet 3  . lisinopril (PRINIVIL,ZESTRIL) 40 MG tablet Take 1 tablet (40 mg total) by mouth daily. 90 tablet 3  . metFORMIN (GLUCOPHAGE) 500 MG tablet Take 1 tablet (500 mg total) by mouth daily with breakfast. 90 tablet 1  . mirtazapine (REMERON) 30 MG tablet Take 1 tablet (30 mg total) by mouth at bedtime. 90 tablet 3  . aspirin EC 81 MG tablet Take 1 tablet (81 mg total) by mouth daily. (Patient not taking: Reported on 01/24/2018) 100 tablet 1  . Cholecalciferol (VITAMIN D3) 3000 units TABS Take by mouth.    . fluticasone (FLONASE) 50 MCG/ACT nasal spray Place 2 sprays into both nostrils daily. (Patient not taking: Reported on 01/24/2018) 16 g 3  . NITROSTAT 0.4 MG SL tablet PLACE 1 TABLET (0.4 MG TOTAL) UNDER THE TONGUE EVERY 5 (FIVE) MINUTES AS NEEDED FOR CHEST PAIN. (Patient not taking: Reported on 01/24/2018) 30 tablet 0   No current facility-administered medications on file prior to visit.     Allergies  Allergen Reactions  . Codeine Anaphylaxis  . Shellfish Allergy  Swelling and Rash    Throat Sweling  . Opium     Interferes with Keppra  . Banana Rash    Cannot eat any while taking KEPPRA  . Catfish [Fish Allergy] Rash  . Orange Fruit [Citrus] Rash    Cannot have while taking KEPPRA  . Tomato Itching and Rash    Social History   Socioeconomic History  . Marital status: Divorced    Spouse name: Not on file  . Number of children: 1  . Years of education: Not on file  . Highest education level: Not on file  Occupational History  . Not on file  Social Needs  . Financial  resource strain: Not on file  . Food insecurity:    Worry: Not on file    Inability: Not on file  . Transportation needs:    Medical: Not on file    Non-medical: Not on file  Tobacco Use  . Smoking status: Never Smoker  . Smokeless tobacco: Never Used  Substance and Sexual Activity  . Alcohol use: No    Alcohol/week: 0.0 standard drinks  . Drug use: No  . Sexual activity: Never  Lifestyle  . Physical activity:    Days per week: Not on file    Minutes per session: Not on file  . Stress: Not on file  Relationships  . Social connections:    Talks on phone: Not on file    Gets together: Not on file    Attends religious service: Not on file    Active member of club or organization: Not on file    Attends meetings of clubs or organizations: Not on file    Relationship status: Not on file  . Intimate partner violence:    Fear of current or ex partner: Not on file    Emotionally abused: Not on file    Physically abused: Not on file    Forced sexual activity: Not on file  Other Topics Concern  . Not on file  Social History Narrative  . Not on file    Family History  Problem Relation Age of Onset  . Asthma Mother   . Hypertension Father   . Heart disease Father   . Stroke Father   . Cancer Sister        uterus cancer   . Diabetes Maternal Uncle   . Breast cancer Cousin   . Colon cancer Neg Hx     Past Surgical History:  Procedure Laterality Date  . ABDOMINAL HYSTERECTOMY    . COLONOSCOPY N/A 07/15/2015   Dr. Darrick Penna: small-mouth diverticula, small non-bleeding internal hemorrhoids, redundant colon   . HERNIA REPAIR    . MYOMECTOMY      ROS: Review of Systems Neg except as above  PHYSICAL EXAM: BP 133/84   Pulse 79   Temp 98.2 F (36.8 C) (Oral)   Resp 16   Ht 5\' 2"  (1.575 m)   Wt 215 lb 9.6 oz (97.8 kg)   SpO2 97%   BMI 39.43 kg/m   Wt Readings from Last 3 Encounters:  01/24/18 215 lb 9.6 oz (97.8 kg)  11/02/17 210 lb (95.3 kg)  10/22/17 212 lb 9.6  oz (96.4 kg)    Physical Exam  General appearance - alert, well appearing, and in no distress.  He has mild audible congestion Mental status - normal mood, behavior, speech, dress, motor activity, and thought processes Ears - bilateral TM's and external ear canals normal Nose - normal and patent,  no erythema, discharge or polyps Mouth - mucous membranes moist, pharynx normal without lesions Neck - supple, no significant adenopathy Chest - clear to auscultation, no wheezes, rales or rhonchi, symmetric air entry Heart - normal rate, regular rhythm, normal S1, S2, no murmurs, rubs, clicks or gallops Extremities - 1+ BL LE edema   Results for orders placed or performed in visit on 01/24/18  POCT glucose (manual entry)  Result Value Ref Range   POC Glucose 100 (A) 70 - 99 mg/dl   Lab Results  Component Value Date   HGBA1C 5.7 (A) 10/22/2017    ASSESSMENT AND PLAN: 1. Essential hypertension Close to goal.  She has not taken HCTZ as yet for today but will take it when she returns home.  She does have some lower extremity edema likely due to the fact that she has not taken the HCTZ as yet for today.  Encourage low-salt diet.  Patient to continue lisinopril, HCTZ and amlodipine. - hydrochlorothiazide (HYDRODIURIL) 25 MG tablet; Take 1 tablet (25 mg total) by mouth daily.  Dispense: 90 tablet; Refill: 3  2. Viral upper respiratory tract infection Recommend conservative management.  She can try Coricidin HBP over-the-counter for congestion.  No need for antibiotics at this time - benzonatate (TESSALON) 200 MG capsule; Take 1 capsule (200 mg total) by mouth 3 (three) times daily as needed for cough.  Dispense: 20 capsule; Refill: 0  3. Prediabetes Continue metformin. Encourage her to start getting in some form of regular aerobic exercise like walking 3 to 4 days a week starting at 15 minutes.  Healthy eating habits discussed and encouraged - POCT glucose (manual entry)  4. Obesity (BMI  30-39.9) See #3 above  5. Major depressive disorder, recurrent episode, moderate (HCC) She is doing much better now that she is not having to babysit her grandchildren.  She is plugged into mental health services.  6. Convulsions, unspecified convulsion type (HCC) History of seizures and pseudoseizures.  Followed by neurology.   Patient was given the opportunity to ask questions.  Patient verbalized understanding of the plan and was able to repeat key elements of the plan.   Orders Placed This Encounter  Procedures  . POCT glucose (manual entry)     Requested Prescriptions   Signed Prescriptions Disp Refills  . loratadine (CLARITIN) 10 MG tablet 30 tablet 2    Sig: Take 1 tablet (10 mg total) by mouth daily.  . hydrochlorothiazide (HYDRODIURIL) 25 MG tablet 90 tablet 3    Sig: Take 1 tablet (25 mg total) by mouth daily.  . benzonatate (TESSALON) 200 MG capsule 20 capsule 0    Sig: Take 1 capsule (200 mg total) by mouth 3 (three) times daily as needed for cough.    Return in about 3 months (around 04/25/2018).  Jonah Blueeborah Ytzel Gubler, MD, FACP

## 2018-01-24 NOTE — Patient Instructions (Signed)
You can purchase and use Coricidin HBP over-the-counter as needed for congestion.  I have sent refill on the blood pressure medication hydrochlorothiazide to the pharmacy for you to pick up.  Try to get in some form of exercise like walking 3 to 4 days a week for 20 to 30 minutes.

## 2018-02-01 ENCOUNTER — Ambulatory Visit (HOSPITAL_COMMUNITY): Payer: Medicaid Other | Admitting: Licensed Clinical Social Worker

## 2018-02-16 ENCOUNTER — Ambulatory Visit (INDEPENDENT_AMBULATORY_CARE_PROVIDER_SITE_OTHER): Payer: Medicaid Other | Admitting: Licensed Clinical Social Worker

## 2018-02-16 ENCOUNTER — Encounter (HOSPITAL_COMMUNITY): Payer: Self-pay | Admitting: Licensed Clinical Social Worker

## 2018-02-16 DIAGNOSIS — F331 Major depressive disorder, recurrent, moderate: Secondary | ICD-10-CM
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
## 2018-02-17 ENCOUNTER — Ambulatory Visit (INDEPENDENT_AMBULATORY_CARE_PROVIDER_SITE_OTHER): Payer: Medicaid Other | Admitting: Psychiatry

## 2018-02-17 ENCOUNTER — Encounter (HOSPITAL_COMMUNITY): Payer: Self-pay | Admitting: Psychiatry

## 2018-02-17 VITALS — BP 124/75 | HR 74 | Ht 62.0 in | Wt 216.0 lb

## 2018-02-17 DIAGNOSIS — F5101 Primary insomnia: Secondary | ICD-10-CM

## 2018-02-17 DIAGNOSIS — F4322 Adjustment disorder with anxiety: Secondary | ICD-10-CM

## 2018-02-17 DIAGNOSIS — F331 Major depressive disorder, recurrent, moderate: Secondary | ICD-10-CM

## 2018-02-17 MED ORDER — MIRTAZAPINE 30 MG PO TABS: 30.0000 mg | ORAL_TABLET | Freq: Every day | ORAL | 5 refills | Status: DC

## 2018-02-17 MED FILL — MIRTAZAPINE 30 MG TABLET: 30 | 30 days supply | Qty: 30 | Fill #0
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
## 2018-02-18 ENCOUNTER — Other Ambulatory Visit: Payer: Self-pay | Admitting: Internal Medicine

## 2018-02-18 DIAGNOSIS — E119 Type 2 diabetes mellitus without complications: Secondary | ICD-10-CM

## 2018-02-18 MED FILL — LORATADINE 10 MG TABLET: 10 | 30 days supply | Qty: 30 | Fill #1

## 2018-02-18 MED FILL — LEVETIRACETAM ER 500 MG TAB: 500 | 30 days supply | Qty: 90 | Fill #4

## 2018-02-18 MED FILL — LISINOPRIL 40 MG TABLET: 40 | 30 days supply | Qty: 30 | Fill #3

## 2018-02-21 MED FILL — AMLODIPINE BESYLATE 10 MG T: 10 | 30 days supply | Qty: 30 | Fill #0

## 2018-02-21 MED FILL — metFORMIN HCL 500 MG TABS: 500 | 30 days supply | Qty: 30 | Fill #0

## 2018-02-22 MED FILL — AMLODIPINE BESYLATE 10 MG T: 10 | 30 days supply | Qty: 30 | Fill #0

## 2018-02-23 ENCOUNTER — Ambulatory Visit (INDEPENDENT_AMBULATORY_CARE_PROVIDER_SITE_OTHER): Payer: Medicaid Other | Admitting: Licensed Clinical Social Worker

## 2018-02-23 DIAGNOSIS — F331 Major depressive disorder, recurrent, moderate: Secondary | ICD-10-CM | POA: Diagnosis not present

## 2018-02-24 ENCOUNTER — Encounter (HOSPITAL_COMMUNITY): Payer: Self-pay | Admitting: Licensed Clinical Social Worker

## 2018-02-24 NOTE — Progress Notes (Addendum)
THERAPIST PROGRESS NOTE  Session Time: 9:10-10:00am  Participation Level: Active  Behavioral Response: CasualAlert/anxious  Type of Therapy: Individual Therapy  Treatment Goals addressed:   Interventions: CBT  Summary: [name] is a 63 y.o. female who presents for her individual counseling session.Pt discussed her psychiatric symptoms and current life events. Pt presents anxious today. She took the bus to her appointment today. Assisted pt with a breathing activity to assist with her current anxiety. Pt met her new psychiatrist Dr. Doyne Keel. She felt it went well, she enjoyed meeting her and no meds were changed. Asked open ended questions. Pt shared she had a spiritual dream last night and wanted to process it. Asked open ended questions and used empathic reflection. Pt discussed her seizures, haven't had one since 1/4. They seem to come on when she has conflict/stress with her daughter. Processed the conflict with pt. Began a discussion of being pregnant and the birth of her daughter. Will continue discussion at next session.    Suicidal/Homicidal: Nowithout intent/plan  Therapist Response: Assessed pt's current functioning and reviewed progress. Assisted pt with stressors, conflict with daughter,  seizures, breathing exercise,  psychiatrist appointment. Assisted pt in processing her management of stressors.  Plan: Return again in 1 month. Continue discussion about birth of daughter  Diagnosis:      Axis I: Major Depressive Disorder, recurrent episode moderate                              MACKENZIE,LISBETH S, LCAS 02/23/2018

## 2018-03-01 ENCOUNTER — Ambulatory Visit (HOSPITAL_COMMUNITY): Payer: Self-pay | Admitting: Psychiatry

## 2018-03-10 ENCOUNTER — Ambulatory Visit (HOSPITAL_COMMUNITY): Payer: Medicaid Other | Admitting: Licensed Clinical Social Worker

## 2018-03-29 ENCOUNTER — Ambulatory Visit (INDEPENDENT_AMBULATORY_CARE_PROVIDER_SITE_OTHER): Payer: Medicaid Other | Admitting: Licensed Clinical Social Worker

## 2018-03-29 ENCOUNTER — Encounter (HOSPITAL_COMMUNITY): Payer: Self-pay | Admitting: Licensed Clinical Social Worker

## 2018-03-29 DIAGNOSIS — F331 Major depressive disorder, recurrent, moderate: Secondary | ICD-10-CM

## 2018-03-29 NOTE — Progress Notes (Signed)
THERAPIST PROGRESS NOTE  Session Time: 10:10-11:00am  Participation Level: Active  Behavioral Response: CasualAlert/anxious  Type of Therapy: Individual Therapy  Treatment Goals addressed:   Interventions: CBT  Summary: [name] is a 63 y.o. female who presents for her individual counseling session.Pt discussed her psychiatric symptoms and current life events. Pt presents anxious and agitated today. "Lavenia Atlas been arguing with everyone in my life." Asked open ended questions and used empathic reflection. Used a breathing technique to assist in the calming process. She discussed her disagreements and outcomes. Reminded pt that her seizures present when she is arguing. Educated pt on problem solving skills.        Suicidal/Homicidal: Nowithout intent/plan  Therapist Response: Assessed pt's current functioning and reviewed progress. Assisted pt with stressors, conflict with relatives,seizures, problem solving skills, breathing technique. Assisted pt in processing her management of stressors.  Plan: Return again in 1 month. Continue discussion about birth of daughter  Diagnosis:      Axis I: Major Depressive Disorder, recurrent episode moderate                              , S, LCAS 03/29/2018

## 2018-04-12 ENCOUNTER — Ambulatory Visit (INDEPENDENT_AMBULATORY_CARE_PROVIDER_SITE_OTHER): Payer: Medicaid Other | Admitting: Licensed Clinical Social Worker

## 2018-04-12 ENCOUNTER — Encounter (HOSPITAL_COMMUNITY): Payer: Self-pay | Admitting: Licensed Clinical Social Worker

## 2018-04-12 ENCOUNTER — Other Ambulatory Visit: Payer: Self-pay

## 2018-04-12 DIAGNOSIS — F331 Major depressive disorder, recurrent, moderate: Secondary | ICD-10-CM | POA: Diagnosis not present
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
## 2018-04-19 MED FILL — LORATADINE 10 MG TABLET: 10 | 30 days supply | Qty: 30 | Fill #2

## 2018-04-19 MED FILL — MIRTAZAPINE 30 MG TABLET: 30 | 90 days supply | Qty: 90 | Fill #1

## 2018-04-19 MED FILL — LISINOPRIL 40 MG TABLET: 40 | 90 days supply | Qty: 90 | Fill #4

## 2018-04-19 MED FILL — metFORMIN HCL 500 MG TABS: 500 | 60 days supply | Qty: 60 | Fill #1

## 2018-04-19 MED FILL — HYDROCHLOROTHIAZIDE 25 MG T: 25 | 90 days supply | Qty: 90 | Fill #1

## 2018-04-19 MED FILL — AMLODIPINE BESYLATE 10 MG T: 10 | 60 days supply | Qty: 60 | Fill #1

## 2018-04-19 MED FILL — LEVETIRACETAM ER 500 MG TAB: 500 | 90 days supply | Qty: 270 | Fill #0

## 2018-04-26 ENCOUNTER — Other Ambulatory Visit: Payer: Self-pay

## 2018-04-26 ENCOUNTER — Ambulatory Visit (INDEPENDENT_AMBULATORY_CARE_PROVIDER_SITE_OTHER): Payer: Medicaid Other | Admitting: Licensed Clinical Social Worker

## 2018-04-26 ENCOUNTER — Telehealth: Payer: Self-pay | Admitting: Neurology

## 2018-04-26 ENCOUNTER — Ambulatory Visit (HOSPITAL_COMMUNITY): Payer: Medicaid Other | Admitting: Licensed Clinical Social Worker

## 2018-04-26 ENCOUNTER — Encounter (HOSPITAL_COMMUNITY): Payer: Self-pay | Admitting: Licensed Clinical Social Worker

## 2018-04-26 DIAGNOSIS — F331 Major depressive disorder, recurrent, moderate: Secondary | ICD-10-CM

## 2018-04-26 NOTE — Telephone Encounter (Signed)
Patient is calling in about the levetiracetam  medication. Please call her back at [phone]. Thanks!

## 2018-04-26 NOTE — Progress Notes (Addendum)
Virtual Visit via Telephone Note  I connected with [name] on 04/26/18 at 10:00 AM EDT by phone application and verified that I am speaking with the correct person using two identifiers.   I discussed the limitations of evaluation and management by telemedicine and the availability of in person appointments. The patient expressed understanding and agreed to proceed.  History of Present Illness: Pt was referred for OP therapy by her neurologist Dr. Timpson Lions for depression.    Observations/Objective: Verbalize hopeful and positive statements regarding the future. Pt presents concerned about the virus.    Assessment and Plan: Assisted patient prioritzing needs during the virus. Pt reported she began the symptoms of having a seizure and got in a fetal position of the floor, which is a coping skills that has worked in the past. She has not had her medication as it ran out over week ago. Pt has epilepsy and has been unable to get her medication [facility]. Went online to see if they can be contacted by email. Called [facility] and I was  Placed on hold. Suggested to pt to call and remain on hold herself, ask her daughter to do the same and her sister to do the same. Also suggested pt to get her groceries delivered and instructed pt how to order her groceries. Suggested pt wear her mask and gloves when her lunch is delivered daily to her room.  Suggested to pt to use her mask and gloves if she goes out. Suggested to pt to call her PCP and Neurologist and tell them she is out of her meds and ask for suggestions. Pt wrote down all information. Reviewed tx plan with pt.   Follow Up Instructions:    I discussed the assessment and treatment plan with the patient. The patient was provided an opportunity to ask questions and all were answered. The patient agreed with the plan and demonstrated an understanding of the instructions.   The patient was advised to call back or seek an  in-person evaluation if the symptoms worsen or if the condition fails to improve as anticipated.  I provided 50 minutes of non-face-to-face time during this encounter.   , S, LCAS

## 2018-04-26 NOTE — Telephone Encounter (Signed)
Returned call to pt.  No answer.  VM Box not set up.  Unable to leave message 

## 2018-05-03 ENCOUNTER — Other Ambulatory Visit: Payer: Self-pay

## 2018-05-03 ENCOUNTER — Ambulatory Visit: Payer: Medicaid Other | Attending: Internal Medicine | Admitting: Internal Medicine

## 2018-05-03 ENCOUNTER — Telehealth: Payer: Self-pay

## 2018-05-03 ENCOUNTER — Encounter: Payer: Self-pay | Admitting: Internal Medicine

## 2018-05-03 VITALS — BP 148/90 | HR 81 | Temp 97.8°F | Resp 16 | Wt 226.0 lb

## 2018-05-03 DIAGNOSIS — R7303 Prediabetes: Secondary | ICD-10-CM

## 2018-05-03 DIAGNOSIS — Z6841 Body Mass Index (BMI) 40.0 and over, adult: Secondary | ICD-10-CM

## 2018-05-03 DIAGNOSIS — I10 Essential (primary) hypertension: Secondary | ICD-10-CM

## 2018-05-03 DIAGNOSIS — G6289 Other specified polyneuropathies: Secondary | ICD-10-CM | POA: Diagnosis not present

## 2018-05-03 DIAGNOSIS — Z638 Other specified problems related to primary support group: Secondary | ICD-10-CM

## 2018-05-03 LAB — POCT GLYCOSYLATED HEMOGLOBIN (HGB A1C): HbA1c, POC (prediabetic range): 6.1 % (ref 5.7–6.4)

## 2018-05-03 LAB — GLUCOSE, POCT (MANUAL RESULT ENTRY): POC Glucose: 116 mg/dl — AB (ref 70–99)

## 2018-05-03 NOTE — Patient Instructions (Signed)
I have given you a prescription to get a blood pressure monitoring device.  Please check your blood pressure at least twice a week and write down the numbers so that we can review them on your next visit.  Blood pressure goal is 130/80 or lower.  Make sure that you are wearing close and shoes when you are up moving around.  Because you have decreased sensation in your feet you are at risk for developing infection in the foot and not knowing it.  Follow a Healthy Eating Plan - You can do it! Limit sugary drinks.  Avoid sodas, sweet tea, sport or energy drinks, or fruit drinks.  Drink water, lo-fat milk, or diet drinks. Limit snack foods.   Cut back on candy, cake, cookies, chips, ice cream.  These are a special treat, only in small amounts. Eat plenty of vegetables.  Especially dark green, red, and orange vegetables. Aim for at least 3 servings a day. More is better! Include fruit in your daily diet.  Whole fruit is much healthier than fruit juice! Limit "white" bread, "white" pasta, "white" rice.   Choose "100% whole grain" products, brown or wild rice. Avoid fatty meats. Try "Meatless Monday" and choose eggs or beans one day a week.  When eating meat, choose lean meats like chicken, [place], and fish.  Grill, broil, or bake meats instead of frying, and eat poultry without the skin. Eat less salt.  Avoid frozen pizzas, frozen dinners and salty foods.  Use seasonings other than salt in cooking.  This can help blood pressure and keep you from swelling Beer, wine and liquor have calories.  If you can safely drink alcohol, limit to 1 drink per day for women, 2 drinks for men

## 2018-05-03 NOTE — Telephone Encounter (Signed)
Order for BP monitor faxed to [facility] 

## 2018-05-03 NOTE — Progress Notes (Signed)
Patient ID: [name], female    DOB: Jan 15, 1956  MRN: [id number]  CC: Follow-up   Subjective: [name] is a 63 y.o. female who presents for chronic ds management. Her concerns today include:  Pt with hx of depression, HTN, Sz dsand pseudoSZ, pre-DM, trichotillomania, internal hemorrhoids, seasonal allergies  Still having a lot of family stressers dealing with her daughter and her daughter's father. Sees her counselor regularly.  HTN:  Compliant with meds and salt restriction -She has not taken meds as yet for the morning  -no device to check BP but would like to get one -no swelling in legs.  SOB when she walk for more than 30 mins Walks 6 days/wk for 46 mins since April 1st  Pre-DM:  Compliant with Metformin.  A1C today is 6.1 Walking 6 days a wk Eating more salads without dressing. Love soups.  Drinks mainly water.  Wgh up 11 lbs since last visit in January.  States she has a scale at home.  When she saw the wgh gain, she decided to start walking the 1st of this mth Endorses numbness in soles of feet. Patient Active Problem List   Diagnosis Date Noted  . Obesity (BMI 30-39.9) 09/17/2016  . Internal hemorrhoid 09/16/2016  . Adjustment disorder with mixed anxiety and depressed mood 12/04/2015  . Primary insomnia 12/04/2015  . Prediabetes 10/09/2013  . Facial droop 10/09/2013  . Seizures (HCC) 08/24/2013  . Obesity, unspecified 10/17/2007  . 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 PES PLANUS, RIGHT 07/20/2007  . DYSTONIA 01/04/2007  . Major depressive disorder, recurrent episode (HCC) 03/18/2006  . Essential hypertension 03/18/2006  . PSORIASIS 03/18/2006     Current Outpatient Medications on File Prior to Visit  Medication Sig Dispense Refill  . amLODipine (NORVASC) 10 MG tablet TAKE 1 TABLET BY MOUTH DAILY. 30 tablet 2  . aspirin EC 81 MG tablet Take 1 tablet (81 mg total) by mouth daily. 100 tablet 1  . Cholecalciferol (VITAMIN D3) 3000 units TABS Take by  mouth.    . hydrochlorothiazide (HYDRODIURIL) 25 MG tablet Take 1 tablet (25 mg total) by mouth daily. 90 tablet 3  . levETIRAcetam (KEPPRA XR) 500 MG 24 hr tablet Take 1 tablet three times a day 270 tablet 3  . lisinopril (PRINIVIL,ZESTRIL) 40 MG tablet Take 1 tablet (40 mg total) by mouth daily. 90 tablet 3  . loratadine (CLARITIN) 10 MG tablet Take 1 tablet (10 mg total) by mouth daily. 30 tablet 2  . metFORMIN (GLUCOPHAGE) 500 MG tablet TAKE 1 TABLET BY MOUTH DAILY WITH BREAKFAST. 30 tablet 2  . mirtazapine (REMERON) 30 MG tablet Take 1 tablet (30 mg total) by mouth at bedtime. 90 tablet 5   No current facility-administered medications on file prior to visit.     Allergies  Allergen Reactions  . Codeine Anaphylaxis  . Shellfish Allergy Swelling and Rash    Throat Sweling  . Opium     Interferes with Keppra  . Banana Rash    Cannot eat any while taking KEPPRA  . Catfish [Fish Allergy] Rash  . Orange Fruit [Citrus] Rash    Cannot have while taking KEPPRA  . Tomato Itching and Rash    Social History   Socioeconomic History  . Marital status: Divorced    Spouse name: Not on file  . Number of children: 1  . Years of education: Not on file  . Highest education level: Not on file  Occupational History  . Not on  file  Social Needs  . Financial resource strain: Not on file  . Food insecurity:    Worry: Not on file    Inability: Not on file  . Transportation needs:    Medical: Not on file    Non-medical: Not on file  Tobacco Use  . Smoking status: Never Smoker  . Smokeless tobacco: Never Used  Substance and Sexual Activity  . Alcohol use: No    Alcohol/week: 0.0 standard drinks  . Drug use: No  . Sexual activity: Never  Lifestyle  . Physical activity:    Days per week: Not on file    Minutes per session: Not on file  . Stress: Not on file  Relationships  . Social connections:    Talks on phone: Not on file    Gets together: Not on file    Attends religious  service: Not on file    Active member of club or organization: Not on file    Attends meetings of clubs or organizations: Not on file    Relationship status: Not on file  . Intimate partner violence:    Fear of current or ex partner: Not on file    Emotionally abused: Not on file    Physically abused: Not on file    Forced sexual activity: Not on file  Other Topics Concern  . Not on file  Social History Narrative  . Not on file    Family History  Problem Relation Age of Onset  . Asthma Mother   . Hypertension Father   . Heart disease Father   . Stroke Father   . Cancer Sister        uterus cancer   . Diabetes Maternal Uncle   . Breast cancer Cousin   . Colon cancer Neg Hx     Past Surgical History:  Procedure Laterality Date  . ABDOMINAL HYSTERECTOMY    . COLONOSCOPY N/A 07/15/2015   Dr. Darrick Penna: small-mouth diverticula, small non-bleeding internal hemorrhoids, redundant colon   . HERNIA REPAIR    . MYOMECTOMY      ROS: Review of Systems Negative except as stated above  PHYSICAL EXAM: BP (!) 148/90   Pulse 81   Temp 97.8 F (36.6 C) (Oral)   Resp 16   Wt 226 lb (102.5 kg)   SpO2 98%   BMI 41.34 kg/m   Wt Readings from Last 3 Encounters:  05/03/18 226 lb (102.5 kg)  01/24/18 215 lb 9.6 oz (97.8 kg)  11/02/17 210 lb (95.3 kg)   Physical Exam General appearance - alert, well appearing, and in no distress Mental status - normal mood, behavior, speech, dress, motor activity, and thought processes Neck - supple, no significant adenopathy Chest - clear to auscultation, no wheezes, rales or rhonchi, symmetric air entry Heart - normal rate, regular rhythm, normal S1, S2, no murmurs, rubs, clicks or gallops Extremities - trace LE edema BL Diabetic Foot Exam - Simple   Simple Foot Form Visual Inspection See comments:  Yes Sensation Testing See comments:  Yes Pulse Check Posterior Tibialis and Dorsalis pulse intact bilaterally:  Yes Comments Flat footed.    Dec sensation on soles of both feet with leap    Results for orders placed or performed in visit on 05/03/18  HgB A1c  Result Value Ref Range   Hemoglobin A1C     HbA1c POC (<> result, manual entry)     HbA1c, POC (prediabetic range) 6.1 5.7 - 6.4 %   HbA1c, POC (  controlled diabetic range)    Glucose (CBG)  Result Value Ref Range   POC Glucose 116 (A) 70 - 99 mg/dl     CMP Latest Ref Rng & Units 10/22/2017 12/15/2016 02/18/2016  Glucose 65 - 99 mg/dL 85 95 86  BUN 8 - 27 mg/dL Creatinine 0.57 - 1.00 mg/dL 1.61 0.96 0.45  Sodium 134 - 144 mmol/L 143 141 138  Potassium 3.5 - 5.2 mmol/L 4.0 3.8 4.2  Chloride 96 - 106 mmol/L 102 100 101  CO2 20 - 29 mmol/L Calcium 8.7 - 10.3 mg/dL 9.7 10.3(H) 9.7  Total Protein 6.0 - 8.5 g/dL 7.1 7.5 7.5  Total Bilirubin 0.0 - 1.2 mg/dL 0.3 <4.0 0.3  Alkaline Phos 39 - 117 IU/L 116 95 92  AST 0 - 40 IU/L ALT 0 - 32 IU/L Lipid Panel     Component Value Date/Time   CHOL 120 10/22/2017 1037   TRIG 71 10/22/2017 1037   HDL 48 10/22/2017 1037   CHOLHDL 2.5 10/22/2017 1037   CHOLHDL 2.3 02/18/2016 0948   VLDL 25 02/18/2016 0948   LDLCALC 58 10/22/2017 1037    CBC    Component Value Date/Time   WBC 6.8 10/22/2017 1037   WBC 6.9 02/18/2016 0948   RBC 4.85 10/22/2017 1037   RBC 4.74 02/18/2016 0948   HGB 12.3 10/22/2017 1037   HCT 38.2 10/22/2017 1037   PLT 361 10/22/2017 1037   MCV 79 10/22/2017 1037   MCH 25.4 (L) 10/22/2017 1037   MCH 26.8 (L) 02/18/2016 0948   MCHC 32.2 10/22/2017 1037   MCHC 32.8 02/18/2016 0948   RDW 15.6 (H) 10/22/2017 1037   LYMPHSABS 1,863 02/18/2016 0948   MONOABS 483 02/18/2016 0948   EOSABS 138 02/18/2016 0948   BASOSABS 0 02/18/2016 0948    ASSESSMENT AND PLAN: 1. Prediabetes Commended her on starting a regular exercise routine by walking 6 days a week. Continue to encourage her and healthy eating habits.  Advised smaller portion sizes.  Continue metformin. -  HgB A1c - Glucose (CBG)  2. Essential hypertension Not at goal but patient has not taken medicines as yet for today.  She will take them when she returns home.  DASH diet discussed and encouraged  3. Class 3 severe obesity due to excess calories with serious comorbidity and body mass index (BMI) of 40.0 to 44.9 in adult [facility]) See #1 above  4. Stress due to family tension Encouraged her to continue talking with her therapist about these issues.  5. Other polyneuropathy Likely small fiber disease associated with prediabetes.  However we will check B12 level given that she has been on metformin for a while. - Vitamin B12    Patient was given the opportunity to ask questions.  Patient verbalized understanding of the plan and was able to repeat key elements of the plan.   Orders Placed This Encounter  Procedures  . Vitamin B12  . HgB A1c  . Glucose (CBG)     Requested Prescriptions    No prescriptions requested or ordered in this encounter    Return in about 3 months (around 08/02/2018).  Jonah Blue, MD, FACP

## 2018-05-03 NOTE — Progress Notes (Signed)
Follow up appointment

## 2018-05-04 ENCOUNTER — Telehealth: Payer: Self-pay

## 2018-05-04 DIAGNOSIS — R030 Elevated blood-pressure reading, without diagnosis of hypertension: Secondary | ICD-10-CM | POA: Diagnosis not present

## 2018-05-04 LAB — VITAMIN B12: Vitamin B-12: 580 pg/mL (ref 232–1245)

## 2018-05-04 NOTE — Telephone Encounter (Signed)
Contacted pt to go over lab results pt is aware and doesn't have any questions or concerns 

## 2018-05-11 ENCOUNTER — Ambulatory Visit (HOSPITAL_COMMUNITY): Payer: Medicaid Other | Admitting: Licensed Clinical Social Worker

## 2018-05-11 ENCOUNTER — Other Ambulatory Visit: Payer: Self-pay

## 2018-05-11 ENCOUNTER — Telehealth (HOSPITAL_COMMUNITY): Payer: Self-pay | Admitting: Licensed Clinical Social Worker

## 2018-05-11 NOTE — Telephone Encounter (Signed)
Called pt's phone for telehealth appt. Phone disconnected. Called pt's sister to inquire and she got a msg to pt who called me to reschedule appt for tomorrow.  Mohid Furuya, LCAS

## 2018-05-12 ENCOUNTER — Other Ambulatory Visit: Payer: Self-pay

## 2018-05-12 ENCOUNTER — Ambulatory Visit (INDEPENDENT_AMBULATORY_CARE_PROVIDER_SITE_OTHER): Payer: Medicaid Other | Admitting: Licensed Clinical Social Worker

## 2018-05-12 ENCOUNTER — Encounter (HOSPITAL_COMMUNITY): Payer: Self-pay | Admitting: Licensed Clinical Social Worker

## 2018-05-12 DIAGNOSIS — F331 Major depressive disorder, recurrent, moderate: Secondary | ICD-10-CM | POA: Diagnosis not present

## 2018-05-12 NOTE — Progress Notes (Signed)
Virtual Visit via Telephone Note  I connected with [name] on 05/12/18 at 11:00 AM EDT by phone application and verified that I am speaking with the correct person using two identifiers.   I discussed the limitations of evaluation and management by telemedicine and the availability of in person appointments. The patient expressed understanding and agreed to proceed.  History of Present Illness: Pt was referred for OP therapy by her neurologist Dr. [place] Lions for depression.    Observations/Objective: Verbalize hopeful and positive statements regarding the future. Pt presents concerned about the virus due to pt being high risk.   Assessment and Plan: Pt began the session by reporting she finally received her meds by [employer]. Assisted patient prioritzing needs during the virus. Pt has been going with her daughter to the grocery store. Discussed with pt the risks of being in the grocery store at this critical time and suggested to limit her grocery store visits. Pt wanted to talk about her childhood today. Asked open ended questions and used empathic reflection. Validated pt's feelings about her childhood.  Follow Up Instructions:    I discussed the assessment and treatment plan with the patient. The patient was provided an opportunity to ask questions and all were answered. The patient agreed with the plan and demonstrated an understanding of the instructions.   The patient was advised to call back or seek an in-person evaluation if the symptoms worsen or if the condition fails to improve as anticipated.  I provided 50 minutes of non-face-to-face time during this encounter.   [name] S, LCAS

## 2018-05-24 ENCOUNTER — Other Ambulatory Visit: Payer: Self-pay

## 2018-05-24 ENCOUNTER — Ambulatory Visit (INDEPENDENT_AMBULATORY_CARE_PROVIDER_SITE_OTHER): Payer: Medicaid Other | Admitting: Licensed Clinical Social Worker

## 2018-05-24 ENCOUNTER — Encounter (HOSPITAL_COMMUNITY): Payer: Self-pay | Admitting: Licensed Clinical Social Worker

## 2018-05-24 DIAGNOSIS — F331 Major depressive disorder, recurrent, moderate: Secondary | ICD-10-CM | POA: Diagnosis not present

## 2018-05-24 NOTE — Progress Notes (Signed)
Virtual Visit via Telephone Note  I connected with [name] on 05/24/18 at 10:00 AM EDT by phone application and verified that I am speaking with the correct person using two identifiers.   I discussed the limitations of evaluation and management by telemedicine and the availability of in person appointments. The patient expressed understanding and agreed to proceed.  History of Present Illness: Pt was referred for OP therapy by her neurologist Dr. [place] Lions for depression.    Observations/Objective: Verbalize hopeful and positive statements regarding the future. Pt presents anxious due to the continued disagreements within the family unit.   Assessment and Plan: Pt described her psychiatric symptoms and current life events. Pt has been walking 30 minutes per day due to her increased BP, as suggested by her dr. Marland KitchenIt's making me feel better." Pt described the fighting going on between her daughter, her daughter's father, the father of her daughter's children, her daughter's mother-in-law. They keep trying to pull me in." Role played using boundaries with her family. Coached pt on the importance of her health, keeping her stress level low. Discussed options.  Follow Up Instructions:   I discussed the assessment and treatment plan with the patient. The patient was provided an opportunity to ask questions and all were answered. The patient agreed with the plan and demonstrated an understanding of the instructions.   The patient was advised to call back or seek an in-person evaluation if the symptoms worsen or if the condition fails to improve as anticipated.  I provided 60 minutes of non-face-to-face time during this encounter.   Sherronda Sweigert S, LCAS

## 2018-06-07 ENCOUNTER — Other Ambulatory Visit: Payer: Self-pay

## 2018-06-07 ENCOUNTER — Ambulatory Visit (INDEPENDENT_AMBULATORY_CARE_PROVIDER_SITE_OTHER): Payer: Medicaid Other | Admitting: Licensed Clinical Social Worker

## 2018-06-07 ENCOUNTER — Encounter (HOSPITAL_COMMUNITY): Payer: Self-pay | Admitting: Licensed Clinical Social Worker

## 2018-06-07 DIAGNOSIS — F331 Major depressive disorder, recurrent, moderate: Secondary | ICD-10-CM | POA: Diagnosis not present

## 2018-06-07 NOTE — Progress Notes (Signed)
Virtual Visit via Telephone Note  I connected with [name] on 06/07/18 at 10:00 AM EDT by phone application and verified that I am speaking with the correct person using two identifiers.   I discussed the limitations of evaluation and management by telemedicine and the availability of in person appointments. The patient expressed understanding and agreed to proceed.  History of Present Illness: Pt was referred for OP therapy by her neurologist Dr. [place] Lions for depression.    Observations/Objective: Verbalize hopeful and positive statements regarding the future. Pt presents anxious due to the continued disagreements within the family unit.   Assessment and Plan: Pt described her psychiatric symptoms and current life events.  Reviewed treatment plan with patient, reviewing goals and strategies. Continue to work with patient on elevating her mood. Patient continues to be enmeshed with her family (daughter, grandchildren, father of her daughter, father of her grandchildren). This her major stressor. They continue to pull her in and get her involved in all their dysfunction. Role played with patient how to use boundaries with her family.  Coached patient on the importance of self care. Pt reports she continues to walk 30 minutes per day early in the morning. I discussed safety with patient, walking early in the morning.     Follow Up Instructions:   I discussed the assessment and treatment plan with the patient. The patient was provided an opportunity to ask questions and all were answered. The patient agreed with the plan and demonstrated an understanding of the instructions.   The patient was advised to call back or seek an in-person evaluation if the symptoms worsen or if the condition fails to improve as anticipated.  I provided 60 minutes of non-face-to-face time during this encounter.   Christophr Calix S, LCAS

## 2018-06-21 ENCOUNTER — Telehealth (INDEPENDENT_AMBULATORY_CARE_PROVIDER_SITE_OTHER): Payer: Medicaid Other | Admitting: Neurology

## 2018-06-21 ENCOUNTER — Other Ambulatory Visit: Payer: Self-pay

## 2018-06-21 DIAGNOSIS — R569 Unspecified convulsions: Secondary | ICD-10-CM

## 2018-06-21 MED ORDER — LEVETIRACETAM ER 500 MG PO TB24: ORAL_TABLET | ORAL | 3 refills | Status: DC

## 2018-06-21 NOTE — Progress Notes (Signed)
Virtual Visit via Telephone Note The purpose of this virtual visit is to provide medical care while limiting exposure to the novel coronavirus.    Consent was obtained for phone visit:  Yes.   Answered questions that patient had about telehealth interaction:  Yes.   I discussed the limitations, risks, security and privacy concerns of performing an evaluation and management service by telephone. I also discussed with the patient that there may be a patient responsible charge related to this service. The patient expressed understanding and agreed to proceed.  Pt location: Home Physician Location: office Name of referring provider:  Marcine MatarJohnson, Deborah B, MD I connected with .[name] at patients initiation/request on 06/21/2018 at 10:00 AM EDT by telephone and verified that I am speaking with the correct person using two identifiers.  Pt MRN:  [id number] Pt DOB:  1955/06/17   History of Present Illness:  The patient was last seen in October 2019 for a diagnosis of seizures. She was having recurrent episodes of loss of consciousness with report of right-sided symptoms initially, but recently symptoms are preceded by pain and discomfort in her extremities (LE>UE). 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 She was started on Keppra at [facility] last 2015 for seizures. She is taking Levetiracetam ER 500mg  TID without side effects. On prior visits, she would report spells consistently occurring when she is stressed out with her daughter. She reports that since her last visit, everything has been good. She has spoken to her sister and agrees that she has to get away from her daughter, "they must be toxic," when she is around her sister and her brother-in-law, she does not have any spells. She had a small dizzy spell on 5/27 lasting 4 minutes with a little shaking. She felt dizzy one time in February while cleaning the bathroom with bleach,  improved once she left the bathroom. She is proud to report she has been exercising more, she noticed an improvement in foot swelling this month. She reports a diabetic right foot. She denies any headaches, vision changes, no significant falls. She continues to follow-up with Behavioral Health.  History on Initial Assessment 11/2014: This is a pleasant 63 yo RH woman with a history of hypertension and a diagnosis of seizures. She reports that neurological symptoms started in 2005 when she would have involuntary movements of the right leg. She states she had lost her grandmother and this had an effect on her, and had symptoms since then. She had seen neurologist Dr. Aletha HalimAndreas Runheim at that time and was diagnosed with focal limb dystomia, told that this may be caused by a chemical imbalance. She was treated with Botox but did not tolerate it. She reports seeing Dr. Estella Huskunheim from 2006 to 2009, but also at the same time she started having blackouts. She reports that she had a blackout in her doctor's office one time, but instead of being brought to [facility], she was [facility] to [facility]. She has had blackouts so bad that neighbors would tell her she needed to go to the ER. She usually starts feeling pain in both legs, followed by right-sided shaking, numbness on the left side of her face, throat gets tight, then she falls to the floor. She lives by herself and has woken up on the floor. She reports that she can see herself shaking and would go into a fetal position to "ride it out."   In August 2015,  she was brought to the ER due to recent increase in frequency of spells. She reported bilateral numbness and tingling as well as a feeling of discomfort in both upper and lower extremities. She has been told she has convulsions involving her legs. She reported urinary incontinence and tongue soreness. Her BP was very high at that time. 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 No definite gliosis of heterotopic gray matter. Her routine EEG was normal. She was discharged home on Keppra 1000mg  BID which she has been tolerating without side effects. She reports doing well with no similar symptoms until the October 2016. She brings a calendar of her events, She had one last 10/30/14. She reports she had been stressed because her daughter told her that day that she is moving to [place] to what her mother thinks is an unhealthy relationship. She had another episode on 10/30 while walking. On 11/25/14 she had 2 episodes, one lasting 12 minutes, another one lasting an hour. She reports being in the shower, she grabbed the handle and sat in the tub rocking in a fetal position, which she found helps. She believes stress is causing the recent seizures and talking to a therapist does help her. She states she does not know how to relax. 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 There is no history of febrile convulsions, CNS infections such as meningitis/encephalitis, significant traumatic brain injury, neurosurgical procedures, or family history of seizures.  Diagnostic Data: She had a 48-hour EEG which was normal. She reported numbness in her right hand and arm, sharp stomach pain, and dizziness. There were no epileptiform discharges seen during these events. The day she reported the dizziness, she did not push the button, but there was note a a transient significant increase in heart rate from 78 bpm to 168 bpm for around 2 minutes, she was not on video at that time. She had a myocardial perfusion scan last 11/2016 which showed an EF of 66%, small defect of moderate severity in the mid anterior and apical anterior location consistent with ischemia, but given normal systolic function,  could not rule out breast attenuation artifact. She was started on a beta blocker with improvement in tachycardia, 30-day holter with normal sinus rhythm, no arrhythmia. She has been evaluated by Cardiology.    Observations/Objective:  Limited due to nature of phone visit. Patient is awake, alert, pleasant, able to provide history with no dysarthria noted.  Assessment and Plan:   This is a pleasant 63 yo RH woman with a history of hypertension and recurrent episodes of loss of consciousness with report of right-sided symptoms initially, but recently symptoms are preceded by pain and discomfort in her extremities (LE>UE), or provoked by stress with her daughter. 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 Her 48-hour EEG was normal, there was an episode of dizziness, she was noted to have tachycardia up to 168bpm with no epileptiform correlate seen. She  appears to do well on Levetiracetam ER  TID, we discussed avoidance of triggers (stressors), continue follow-up with Behavioral Health. Continue seizure/symptom calendar. She does not drive. She will follow-up in 1 year or earlier if needed.   Follow Up Instructions:    -I discussed the assessment and treatment plan with the patient. The patient was provided an opportunity to ask questions and all were answered. The patient agreed with the plan and demonstrated an understanding of the instructions.   The patient was advised to call back or seek an in-person evaluation if the symptoms worsen or if the condition fails to improve as anticipated.    Total  Time spent in visit with the patient was:  11 minutes, of which 100% of the time was spent in counseling and/or coordinating care on the above.   Pt understands and agrees with the plan of care outlined.     Van Clines, MD

## 2018-06-22 ENCOUNTER — Encounter (HOSPITAL_COMMUNITY): Payer: Self-pay | Admitting: Licensed Clinical Social Worker

## 2018-06-22 ENCOUNTER — Ambulatory Visit (INDEPENDENT_AMBULATORY_CARE_PROVIDER_SITE_OTHER): Payer: Medicaid Other | Admitting: Licensed Clinical Social Worker

## 2018-06-22 DIAGNOSIS — F331 Major depressive disorder, recurrent, moderate: Secondary | ICD-10-CM | POA: Diagnosis not present

## 2018-06-22 NOTE — Progress Notes (Addendum)
Virtual Visit via Telephone Note  I connected with [name] on 06/22/18 at 10:00 AM EDT by phone application and verified that I am speaking with the correct person using two identifiers.   I discussed the limitations of evaluation and management by telemedicine and the availability of in person appointments. The patient expressed understanding and agreed to proceed.  History of Present Illness: Pt was referred for OP therapy by her neurologist Dr. [place] Lions for depression.    Observations/Objective: Verbalize hopeful and positive statements regarding the future. Pt presents anxious due to the continued disagreements within the family unit.   Assessment and Plan: Pt described her psychiatric symptoms and current life events. Continue to work with patient on elevating her mood. Patient continues to be enmeshed with her family (daughter, grandchildren, father of her daughter, father of her grandchildren). Pt had a telemed session with her neurologist. Pt shared she had a seizure on 5/27 and the stress that led up to the seizure. Coached pt on stressors and coping skills, the importance of self care. Neurologist is in agreement of prevention of seizures by staying away from stressful situations, especially her family, use boundaries, continue with her morning exercise, continue with coping skills for her major stressors.    Follow Up Instructions:   I discussed her desire to come back into the office for therapy and she does not feel comfortable coming into the office yet. She wants to continue sessions by phone.   I discussed the assessment and treatment plan with the patient. The patient was provided an opportunity to ask questions and all were answered. The patient agreed with the plan and demonstrated an understanding of the instructions.   The patient was advised to call back or seek an in-person evaluation if the symptoms worsen or if the condition fails to improve as anticipated.  I  provided 60 minutes of non-face-to-face time during this encounter.   MACKENZIE,LISBETH S, LCAS

## 2018-06-23 ENCOUNTER — Ambulatory Visit: Payer: Self-pay | Admitting: Neurology

## 2018-07-14 ENCOUNTER — Encounter (HOSPITAL_COMMUNITY): Payer: Self-pay | Admitting: Licensed Clinical Social Worker

## 2018-07-14 ENCOUNTER — Other Ambulatory Visit: Payer: Self-pay

## 2018-07-14 ENCOUNTER — Ambulatory Visit (INDEPENDENT_AMBULATORY_CARE_PROVIDER_SITE_OTHER): Payer: Medicaid Other | Admitting: Licensed Clinical Social Worker

## 2018-07-14 DIAGNOSIS — F331 Major depressive disorder, recurrent, moderate: Secondary | ICD-10-CM | POA: Diagnosis not present

## 2018-07-14 NOTE — Progress Notes (Signed)
Virtual Visit via Telephone Note  I connected with [name] on 07/14/18 at 12:30 PM EDT by phone application and verified that I am speaking with the correct person using two identifiers. Patient does not have a computer or the Internet.   I discussed the limitations of evaluation and management by telemedicine and the availability of in person appointments. The patient expressed understanding and agreed to proceed.  History of Present Illness: Pt was referred for OP therapy by her neurologist Dr. Charlotte Crumb for depression.    Observations/Objective: Verbalize hopeful and positive statements regarding the future. Pt presents anxious due to the continued disagreements within the family unit.   Assessment and Plan: Pt described her psychiatric symptoms and current life events. "I've had some anxious and depressive moments due to her family (daughter, grandchildren, father of her daughter). Patient continues to have  some anxiety due to the pandemic, riding the bus, and being in public. Encouraged patient to wear and mask and social distance. Coached patient on stressors that she has identified and coping skills to use.  Patient reports no seizures since 5/27. Encouraged patient to continue with self care (morning walks), use boundaries with her family and take care of herself. Made new phone appointment.    Follow Up Instructions:   I discussed her desire to come back into the office for therapy and she does not feel comfortable coming into the office yet. She wants to continue sessions by phone.   I discussed the assessment and treatment plan with the patient. The patient was provided an opportunity to ask questions and all were answered. The patient agreed with the plan and demonstrated an understanding of the instructions.   The patient was advised to call back or seek an in-person evaluation if the symptoms worsen or if the condition fails to improve as anticipated.  I provided 60  minutes of non-face-to-face time during this encounter.   Henna Derderian S, LCAS

## 2018-08-01 ENCOUNTER — Other Ambulatory Visit: Payer: Self-pay | Admitting: Internal Medicine

## 2018-08-01 DIAGNOSIS — E119 Type 2 diabetes mellitus without complications: Secondary | ICD-10-CM

## 2018-08-01 MED FILL — LEVETIRACETAM ER 500 MG TAB: 500 | 90 days supply | Qty: 270 | Fill #1

## 2018-08-01 MED FILL — MIRTAZAPINE 30 MG TABLET: 30 | 90 days supply | Qty: 90 | Fill #2

## 2018-08-01 MED FILL — HYDROCHLOROTHIAZIDE 25 MG T: 25 | 90 days supply | Qty: 90 | Fill #2

## 2018-08-01 MED FILL — LISINOPRIL 40 MG TABLET: 40 | 90 days supply | Qty: 90 | Fill #5

## 2018-08-02 ENCOUNTER — Encounter: Payer: Self-pay | Admitting: Internal Medicine

## 2018-08-02 ENCOUNTER — Ambulatory Visit: Payer: Medicaid Other | Attending: Internal Medicine | Admitting: Internal Medicine

## 2018-08-02 ENCOUNTER — Other Ambulatory Visit: Payer: Self-pay

## 2018-08-02 DIAGNOSIS — E669 Obesity, unspecified: Secondary | ICD-10-CM

## 2018-08-02 DIAGNOSIS — R7303 Prediabetes: Secondary | ICD-10-CM | POA: Diagnosis not present

## 2018-08-02 DIAGNOSIS — Z7189 Other specified counseling: Secondary | ICD-10-CM

## 2018-08-02 DIAGNOSIS — F419 Anxiety disorder, unspecified: Secondary | ICD-10-CM

## 2018-08-02 DIAGNOSIS — I10 Essential (primary) hypertension: Secondary | ICD-10-CM

## 2018-08-02 MED ORDER — BLOOD PRESSURE MONITOR DEVI: 0 refills | Status: DC

## 2018-08-02 MED FILL — LORATADINE 10 MG TABLET: 10 | 30 days supply | Qty: 30 | Fill #0

## 2018-08-02 MED FILL — AMLODIPINE BESYLATE 10 MG T: 10 | 30 days supply | Qty: 30 | Fill #0

## 2018-08-02 MED FILL — metFORMIN HCL 500 MG TABS: 500 | 30 days supply | Qty: 30 | Fill #0

## 2018-08-02 NOTE — Progress Notes (Signed)
Patient verified DOB Patient has taken medication today. Patient has eaten today. Patient denies pain at this time. CBG: not checked today.

## 2018-08-02 NOTE — Progress Notes (Signed)
Virtual Visit via Telephone Note Due to current restrictions/limitations of in-office visits due to the COVID-19 pandemic, this scheduled clinical appointment was converted to a telehealth visit  I connected with [name] on 08/02/18 at 3:42 p.m EDT by telephone and verified that I am speaking with the correct person using two identifiers. I am in my office.  The patient is at home.  Only the patient and myself participated in this encounter.  I discussed the limitations, risks, security and privacy concerns of performing an evaluation and management service by telephone and the availability of in person appointments. I also discussed with the patient that there may be a patient responsible charge related to this service. The patient expressed understanding and agreed to proceed.  History of Present Illness: Pt with hx of depression, HTN, Sz dsand pseudoSZ, pre-DM, trichotillomania, internal hemorrhoids, seasonal allergies.    HTN:  Reports compliance with norvasc, HCTZ and Lisinopril Limits salt in the foods No device to check BP Denies any chest pains or shortness of breath.  No swelling in the legs.  Pre-DM/Obesity: recent wgh was 210 lbs down 16 lbs since last visit Doing better with eating habits She gets out and walk early mornings 5-6 days a wk.  Had a dizzy spell 3 days ago while walking.  She was walking with a mask on and she got very hot.  Sometimes she gets cramps in legs when it is too hot outside.  Usually takes 1-2 bottles of water with her.   -reports she also get sick when she has to ride the bus "because it is too cold in there."    Reports she is not sleeping well since COVID. Very anxious and feels on edge all the time because of the pandemic.  "I just wish things will go back to normal."  She sees a psychiatrist.  She is on Remeron.  Reports that it is not helping her sleep.    Outpatient Encounter Medications as of 08/02/2018  Medication Sig  . amLODipine  (NORVASC) 10 MG tablet TAKE 1 TABLET BY MOUTH DAILY.  [name]. aspirin EC 81 MG tablet Take 1 tablet (81 mg total) by mouth daily.  . Cholecalciferol (VITAMIN D3) 3000 units TABS Take by mouth.  . hydrochlorothiazide (HYDRODIURIL) 25 MG tablet Take 1 tablet (25 mg total) by mouth daily.  [name]. levETIRAcetam (KEPPRA XR) 500 MG 24 hr tablet Take 1 tablet three times a day  . lisinopril (PRINIVIL,ZESTRIL) 40 MG tablet Take 1 tablet (40 mg total) by mouth daily.  [name]. loratadine (CLARITIN) 10 MG tablet TAKE 1 TABLET (10 MG TOTAL) BY MOUTH DAILY.  . metFORMIN (GLUCOPHAGE) 500 MG tablet TAKE 1 TABLET BY MOUTH DAILY WITH BREAKFAST.  . mirtazapine (REMERON) 30 MG tablet Take 1 tablet (30 mg total) by mouth at bedtime.   No facility-administered encounter medications on file as of 08/02/2018.     Observations/Objective: Results for orders placed or performed in visit on 05/03/18  Vitamin B12  Result Value Ref Range   Vitamin B-12 580 232 - 1,245 pg/mL  HgB A1c  Result Value Ref Range   Hemoglobin A1C     HbA1c POC (<> result, manual entry)     HbA1c, POC (prediabetic range) 6.1 5.7 - 6.4 %   HbA1c, POC (controlled diabetic range)    Glucose (CBG)  Result Value Ref Range   POC Glucose 116 (A) 70 - 99 mg/dl     Assessment and Plan: 1. Essential hypertension Level of control unknown.  Patient has Medicaid which will pay for her to get a blood pressure monitoring device.  Patient is agreeable to getting 1.  I told her to check blood pressure at least twice a week with goal being 130/80 or lower. - Blood Pressure Monitor DEVI; Use as directed to check home blood pressure 2-3 times a week  Dispense: 1 Device; Refill: 0  2. Prediabetes Continue metformin.  Commended her on the weight loss that she was able to achieve so far.  Encouraged her to continue healthy eating habits and regular exercise. -Advised that she walk early mornings or late afternoons when the sun is not too hot.  Encouraged her to stay  hydrated.  Advised that if she is outside for a walk and not in a crowded place, okay to take mask off but keep one on her to use if she needs to. -In regards to her riding the bus and getting too cold, I suggested perhaps carrying a light sweater to throw over her shoulders while on the bus.  3. Obesity (BMI 30-39.9) See #2 above  4. Anxiety -Encouraged her to do some deep breathing exercises or watch her humerus television program 1 to 2 hours before bedtime to help her relax.  Avoid watching disturbing news shows or participating in disturbing phone calls 1 to 2 hours before bedtime.  5. Educated About Covid-19 Virus Infection Discussed the importance of wearing face mask when in public, the importance of social distancing and good handwashing to help prevent herself from getting COVID-19   Follow Up Instructions: F/u in 4 mths   I discussed the assessment and treatment plan with the patient. The patient was provided an opportunity to ask questions and all were answered. The patient agreed with the plan and demonstrated an understanding of the instructions.   The patient was advised to call back or seek an in-person evaluation if the symptoms worsen or if the condition fails to improve as anticipated.  I provided 18 minutes of non-face-to-face time during this encounter.   Karle Plumber, MD

## 2018-08-03 ENCOUNTER — Ambulatory Visit (INDEPENDENT_AMBULATORY_CARE_PROVIDER_SITE_OTHER): Payer: Medicaid Other | Admitting: Licensed Clinical Social Worker

## 2018-08-03 ENCOUNTER — Other Ambulatory Visit: Payer: Self-pay

## 2018-08-03 ENCOUNTER — Telehealth: Payer: Self-pay | Admitting: Internal Medicine

## 2018-08-03 ENCOUNTER — Encounter (HOSPITAL_COMMUNITY): Payer: Self-pay | Admitting: Licensed Clinical Social Worker

## 2018-08-03 DIAGNOSIS — F331 Major depressive disorder, recurrent, moderate: Secondary | ICD-10-CM | POA: Diagnosis not present

## 2018-08-03 NOTE — Progress Notes (Signed)
Virtual Visit via Telephone Note  I connected with [name] on 08/03/18 at 11:00 AM EDT by phone enabled telehealth application and verified that I am speaking with the correct person using two identifiers. Patient does not have a computer or the Internet.   I discussed the limitations of evaluation and management by telemedicine and the availability of in person appointments. The patient expressed understanding and agreed to proceed.  History of Present Illness: Pt was referred for OP therapy by her neurologist Dr. Charlotte Crumb for depression.    Observations/Objective: Verbalize hopeful and positive statements regarding the future. Pt presents anxious and depressed due to the continued Covid-19 virus. "I just want life to go back to normal.".    Assessment and Plan: Pt described her psychiatric symptoms and current life events. Pt reports her depressive symptoms have increased due to the continuation of the Covid-19 virus and continuing dysfunction in her family  (daughter, father of her daughter).  Coached pt on using boundaries with the current dysfunction in her family. Asked open ended questions about: "I just want life to go back to normal." Pt named: buses to go back to normal routes/times, having access to government buildings, not having to wear a mask which is difficult due to my  seizures. Encouraged patient to continue with self care (morning walks), use boundaries with her family and take care of herself. Made new phone appointment.    Follow Up Instructions:   I discussed her desire to come back into the office for therapy and she does not feel comfortable coming into the office yet. She wants to continue sessions by phone.   I discussed the assessment and treatment plan with the patient. The patient was provided an opportunity to ask questions and all were answered. The patient agreed with the plan and demonstrated an understanding of the instructions.   The patient was  advised to call back or seek an in-person evaluation if the symptoms worsen or if the condition fails to improve as anticipated.  I provided 60 minutes of non-face-to-face time during this encounter.   Won Kreuzer S, LCAS

## 2018-08-03 NOTE — Telephone Encounter (Signed)
-----   Message from Jackelyn Knife, [place] sent at 08/02/2018  4:05 PM EDT ----- Please contact pt and schedule a 4 month f/u

## 2018-08-03 NOTE — Telephone Encounter (Signed)
Attempted to reach patient no answer unable to lvm

## 2018-08-11 ENCOUNTER — Ambulatory Visit (INDEPENDENT_AMBULATORY_CARE_PROVIDER_SITE_OTHER): Payer: Medicaid Other | Admitting: Licensed Clinical Social Worker

## 2018-08-11 ENCOUNTER — Other Ambulatory Visit: Payer: Self-pay

## 2018-08-11 ENCOUNTER — Encounter (HOSPITAL_COMMUNITY): Payer: Self-pay | Admitting: Licensed Clinical Social Worker

## 2018-08-11 DIAGNOSIS — F331 Major depressive disorder, recurrent, moderate: Secondary | ICD-10-CM | POA: Diagnosis not present

## 2018-08-11 NOTE — Progress Notes (Signed)
Virtual Visit via Telephone Note  I connected with [name] on 08/11/18 at 10:00 AM EDT by phone enabled telehealth application and verified that I am speaking with the correct person using two identifiers. Patient does not have a computer or the Internet.   I discussed the limitations of evaluation and management by telemedicine and the availability of in person appointments. The patient expressed understanding and agreed to proceed.  History of Present Illness: Pt was referred for OP therapy by her neurologist Dr. Charlotte Crumb for depression.    Observations/Objective: Verbalize hopeful and positive statements regarding the future. Pt presents anxious due to the continued Covid-19 virus and the 90+ heat..    Assessment and Plan: Pt described her psychiatric symptoms and current life events. Pt reports her anxiety  symptoms have increased due to the continuation of the Covid-19 virus and 90+ heat wave.  Pt continues walking every morning around 6am, but sometimes has health issues due to the heat. Suggested some safety concerns.  Counselor assessed daily functioning, medication concerns, health concerns and self-care routine.  Pt shared her continuing dysfunction in her family (daughter, father of her daughter).  Educated pt on using boundaries with the current dysfunction in her family.  Role played "saying no."    Follow Up Instructions:   I discussed her desire to come back into the office for therapy and she does not feel comfortable coming into the office yet. She wants to continue sessions by phone.   I discussed the assessment and treatment plan with the patient. The patient was provided an opportunity to ask questions and all were answered. The patient agreed with the plan and demonstrated an understanding of the instructions.   The patient was advised to call back or seek an in-person evaluation if the symptoms worsen or if the condition fails to improve as anticipated.  I  provided 60 minutes of non-face-to-face time during this encounter.   MACKENZIE,LISBETH S, LCAS

## 2018-08-18 ENCOUNTER — Encounter (HOSPITAL_COMMUNITY): Payer: Self-pay | Admitting: Psychiatry

## 2018-08-18 ENCOUNTER — Other Ambulatory Visit: Payer: Self-pay

## 2018-08-18 ENCOUNTER — Ambulatory Visit (INDEPENDENT_AMBULATORY_CARE_PROVIDER_SITE_OTHER): Payer: Medicaid Other | Admitting: Psychiatry

## 2018-08-18 DIAGNOSIS — F5101 Primary insomnia: Secondary | ICD-10-CM | POA: Diagnosis not present

## 2018-08-18 DIAGNOSIS — F331 Major depressive disorder, recurrent, moderate: Secondary | ICD-10-CM | POA: Diagnosis not present

## 2018-08-18 DIAGNOSIS — F4322 Adjustment disorder with anxiety: Secondary | ICD-10-CM | POA: Insufficient documentation

## 2018-08-18 MED ORDER — MIRTAZAPINE 30 MG PO TABS: 30.0000 mg | ORAL_TABLET | Freq: Every day | ORAL | 6 refills | Status: DC

## 2018-08-18 NOTE — Progress Notes (Signed)
Virtual Visit via Telephone Note  I connected with [name] on 08/18/18 at 10:00 AM EDT by telephone and verified that I am speaking with the correct person using two identifiers.  Location: Patient: home Provider: office   I discussed the limitations, risks, security and privacy concerns of performing an evaluation and management service by telephone and the availability of in person appointments. I also discussed with the patient that there may be a patient responsible charge related to this service. The patient expressed understanding and agreed to proceed.   History of Present Illness: Pt states she is not sleeping even with Remeron. It started with the pandemic. She is trying to adjust to the life changes. Pt states she knows she must change some things such as turning off the tv.  She has been walking since April 1st. May was good but June was hard due to the heat. Pt reports she has improved endurance and is able to walk on hills. Pt has lost 10 lbs from daily walking. She is drinking 8 glasses of water a day. She is trying to stay positive and avoids negative people. 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 She has learned a lot about what affects her health. "Other than that I am just trying to hand in there". Her depression is "ok". She spends a lot of time praying, as per her usual. Pt denies SI/HI. She denies isolation and anhedonia and low motivation.    Observations/Objective: I spoke with [name] on the phone.  Pt was calm, pleasant and cooperative.  Pt was engaged in the conversation.  Speech was clear and coherent with slow rate. Pitch was high. Speech was with normal tone and volume.  Mood is mildly depressed and anxious, affect is full. Thought processes are slow, coherent, circumstantial and tangential.  Thought content is logical.  Pt denies SI/HI.   Pt denies auditory and visual hallucinations and did not appear to be responding to internal stimuli.   Memory and concentration are good.  Fund of knowledge and use of language are average.  Insight and judgment are fair.  I am unable to comment on psychomotor activity, general appearance, hygiene, or eye contact as I was unable to physically see the patient on the phone.  Vital signs not available since interview conducted virtually.    Assessment and Plan: MDD-recurrent, moderate; Adjustment d/o with anxious mood; Primary Insomnia  Remeron 30mg  po qHS  Reviewed sleep hygiene and did some problem solving.   Encouraged to continue therapy   Follow Up Instructions: In 6 months or sooner if needed   I discussed the assessment and treatment plan with the patient. The patient was provided an opportunity to ask questions and all were answered. The patient agreed with the plan and demonstrated an understanding of the instructions.   The patient was advised to call back or seek an in-person evaluation if the symptoms worsen or if the condition fails to improve as anticipated.  I provided 20 minutes of non-face-to-face time during this encounter.   Charlcie Cradle, MD

## 2018-08-25 ENCOUNTER — Encounter (HOSPITAL_COMMUNITY): Payer: Self-pay | Admitting: Licensed Clinical Social Worker

## 2018-08-25 ENCOUNTER — Ambulatory Visit (INDEPENDENT_AMBULATORY_CARE_PROVIDER_SITE_OTHER): Payer: Medicaid Other | Admitting: Licensed Clinical Social Worker

## 2018-08-25 ENCOUNTER — Other Ambulatory Visit: Payer: Self-pay

## 2018-08-25 DIAGNOSIS — F331 Major depressive disorder, recurrent, moderate: Secondary | ICD-10-CM

## 2018-08-25 DIAGNOSIS — F4322 Adjustment disorder with anxiety: Secondary | ICD-10-CM | POA: Diagnosis not present

## 2018-08-25 NOTE — Progress Notes (Signed)
Virtual Visit via Telephone Note  I connected with [name] on 08/25/18 at 10:00 AM EDT by phone enabled telehealth application and verified that I am speaking with the correct person using two identifiers. Patient does not have a computer or the Internet.   I discussed the limitations of evaluation and management by telemedicine and the availability of in person appointments. The patient expressed understanding and agreed to proceed.  History of Present Illness: Pt was referred for OP therapy by her neurologist Dr. Charlotte [name] for depression.    Observations/Objective: Verbalize hopeful and positive statements regarding the future.  Assessment and Plan: Pt described her psychiatric symptoms and current life events. Pt reports she saw dr agarwal who kept patient on same medication. Pt reports her anxiety  symptoms have increased due to lack of sleep, having toxic family relationships.  Pt saw Dr. Doyne Keel, psychiatrist, who prescribed no new medication. Discussed sleep hygiene with patient and the importance of sleep. Discussed coping tools for patient for anxiety and depression: "read my Bible." Pt discussed her spirituality. Used socratic questions with pt'[name] toxic family, it'[name] affects on patient'[name] health, options.   Follow Up Instructions:   I discussed her desire to come back into the office for therapy and she does not feel comfortable coming into the office yet. She wants to continue sessions by phone.   I discussed the assessment and treatment plan with the patient. The patient was provided an opportunity to ask questions and all were answered. The patient agreed with the plan and demonstrated an understanding of the instructions.   The patient was advised to call back or seek an in-person evaluation if the symptoms worsen or if the condition fails to improve as anticipated.  I provided 60 minutes of non-face-to-face time during this encounter.   MACKENZIE,[name], LCAS

## 2018-09-08 ENCOUNTER — Other Ambulatory Visit: Payer: Self-pay

## 2018-09-08 ENCOUNTER — Encounter (HOSPITAL_COMMUNITY): Payer: Self-pay | Admitting: Licensed Clinical Social Worker

## 2018-09-08 ENCOUNTER — Ambulatory Visit (INDEPENDENT_AMBULATORY_CARE_PROVIDER_SITE_OTHER): Payer: Medicaid Other | Admitting: Licensed Clinical Social Worker

## 2018-09-08 DIAGNOSIS — F331 Major depressive disorder, recurrent, moderate: Secondary | ICD-10-CM | POA: Diagnosis not present

## 2018-09-08 NOTE — Progress Notes (Signed)
Virtual Visit via Telephone Note  I connected with [name] on 09/08/18 at 10:00 AM EDT by phone enabled telehealth application and verified that I am speaking with the correct person using two identifiers. Patient does not have a computer or the Internet.   I discussed the limitations of evaluation and management by telemedicine and the availability of in person appointments. The patient expressed understanding and agreed to proceed.  History of Present Illness: Pt was referred for OP therapy by her neurologist Dr. Charlotte Crumb for depression.    Observations/Objective: Verbalize hopeful and positive statements regarding the future.  Assessment and Plan: Pt described her psychiatric symptoms and current life events. Patient had just returned home from shopping with her daughter and grandchildren. Patient presented WNL today since her extended family is her biggest stressor. Assessed family dynamics. Assisted patient with recognizing how her family affects her moods and teaching her how she can influence her own moods and behavior in a positive way. Coached patient on how to eliminate negative people in her life which can be helpful in eliminating unhelpful or irrational thought patterns. Patient was receptive to the concept.     Follow Up Instructions:   I discussed her desire to come back into the office for therapy and she does not feel comfortable coming into the office yet. She wants to continue sessions by phone.   I discussed the assessment and treatment plan with the patient. The patient was provided an opportunity to ask questions and all were answered. The patient agreed with the plan and demonstrated an understanding of the instructions.   The patient was advised to call back or seek an in-person evaluation if the symptoms worsen or if the condition fails to improve as anticipated.  I provided 30 minutes of non-face-to-face time during this encounter.   Delron Comer S,  LCAS

## 2018-09-09 MED FILL — LORATADINE 10 MG TABLET: 10 | 30 days supply | Qty: 30 | Fill #1

## 2018-09-09 MED FILL — metFORMIN HCL 500 MG TABS: 500 | 30 days supply | Qty: 30 | Fill #1

## 2018-09-09 MED FILL — AMLODIPINE BESYLATE 10 MG T: 10 | 30 days supply | Qty: 30 | Fill #1

## 2018-09-22 ENCOUNTER — Other Ambulatory Visit: Payer: Self-pay

## 2018-09-22 ENCOUNTER — Ambulatory Visit (INDEPENDENT_AMBULATORY_CARE_PROVIDER_SITE_OTHER): Payer: Medicaid Other | Admitting: Licensed Clinical Social Worker

## 2018-09-22 ENCOUNTER — Encounter (HOSPITAL_COMMUNITY): Payer: Self-pay | Admitting: Licensed Clinical Social Worker

## 2018-09-22 DIAGNOSIS — F331 Major depressive disorder, recurrent, moderate: Secondary | ICD-10-CM | POA: Diagnosis not present

## 2018-09-22 NOTE — Progress Notes (Signed)
Virtual Visit via Telephone Note  I connected with [name] on 09/22/18 at 10:00 AM EDT by phone enabled telehealth application and verified that I am speaking with the correct person using two identifiers. Patient does not have a computer or the Internet.   I discussed the limitations of evaluation and management by telemedicine and the availability of in person appointments. The patient expressed understanding and agreed to proceed.  History of Present Illness: Pt was referred for OP therapy by her neurologist Dr. Charlotte Crumb for depression.    Observations/Objective: Verbalize hopeful and positive statements regarding the future.  Assessment and Plan: Pt presented anxious for her individual therapy session. Pt described her psychiatric symptoms and current life events. Pt reports her family situations have improved. Assessed her family dynamics. Used socratic questioning. Discussed with pt how her family "situations" affect her mental wellness and her risk of having a seizure. Pt acknowledged her understanding. Pt reports she continues to walk every morning at 7am. Discussed personal safety with pt. Pt still stresses about the Covid-19 pandemic and it's affects on her. Discuss the virus safety when she is in public, riding the city bus and in her senior living situation. Pt acknowledged her understanding of the importance of safety in those situations.    Follow Up Instructions:   I discussed her desire to come back into the office for therapy and she does not feel comfortable coming into the office yet. She wants to continue sessions by phone.   I discussed the assessment and treatment plan with the patient. The patient was provided an opportunity to ask questions and all were answered. The patient agreed with the plan and demonstrated an understanding of the instructions.   The patient was advised to call back or seek an in-person evaluation if the symptoms worsen or if the condition  fails to improve as anticipated.  I provided 45 minutes of non-face-to-face time during this encounter.   [name] S, LCAS

## 2018-10-06 ENCOUNTER — Ambulatory Visit (INDEPENDENT_AMBULATORY_CARE_PROVIDER_SITE_OTHER): Payer: Medicaid Other | Admitting: Licensed Clinical Social Worker

## 2018-10-06 ENCOUNTER — Other Ambulatory Visit: Payer: Self-pay

## 2018-10-06 ENCOUNTER — Encounter (HOSPITAL_COMMUNITY): Payer: Self-pay | Admitting: Licensed Clinical Social Worker

## 2018-10-06 DIAGNOSIS — F331 Major depressive disorder, recurrent, moderate: Secondary | ICD-10-CM

## 2018-10-06 NOTE — Progress Notes (Signed)
Virtual Visit via Telephone Note  I connected with [name] on 10/06/18 at 10:00 AM EDT by phone enabled telehealth application and verified that I am speaking with the correct person using two identifiers. Patient does not have a computer or the Internet.   I discussed the limitations of evaluation and management by telemedicine and the availability of in person appointments. The patient expressed understanding and agreed to proceed.  History of Present Illness: Pt was referred for OP therapy by her neurologist Dr. Charlotte Crumb for depression.    Observations/Objective: Verbalize hopeful and positive statements regarding the future.  Assessment and Plan: Pt presented anxious for her individual therapy session. Pt described her psychiatric symptoms and current life events. Pt reports her family situation remains stable. Reassessed her family dynamics. Used socratic questioning. Reminded pt how her family "situations" affect her mental wellness and her risk of having a seizure. Pt reports no seizures since may. Discussed the importance of self care - eating healthier, daily walking.  Reminded patient of virus safety when she is in public, riding the city bus and in her senior living situation. Pt acknowledged her understanding of the importance of safety in those situations. Made new appointments.   Follow Up Instructions:   I discussed her desire to come back into the office for therapy and she does not feel comfortable coming into the office yet. She wants to continue sessions by phone.   I discussed the assessment and treatment plan with the patient. The patient was provided an opportunity to ask questions and all were answered. The patient agreed with the plan and demonstrated an understanding of the instructions.   The patient was advised to call back or seek an in-person evaluation if the symptoms worsen or if the condition fails to improve as anticipated.  I provided 45 minutes of  non-face-to-face time during this encounter.   Clayborne Divis S, LCAS

## 2018-10-20 ENCOUNTER — Other Ambulatory Visit: Payer: Self-pay

## 2018-10-20 ENCOUNTER — Ambulatory Visit (INDEPENDENT_AMBULATORY_CARE_PROVIDER_SITE_OTHER): Payer: Medicaid Other | Admitting: Licensed Clinical Social Worker

## 2018-10-20 ENCOUNTER — Encounter (HOSPITAL_COMMUNITY): Payer: Self-pay | Admitting: Licensed Clinical Social Worker

## 2018-10-20 DIAGNOSIS — F331 Major depressive disorder, recurrent, moderate: Secondary | ICD-10-CM | POA: Diagnosis not present

## 2018-10-20 NOTE — Progress Notes (Signed)
Virtual Visit via Telephone Note  I connected with [name] on 10/20/18 at 10:00 AM EDT by phone enabled telehealth application and verified that I am speaking with the correct person using two identifiers. Patient does not have a computer or the Internet.   I discussed the limitations of evaluation and management by telemedicine and the availability of in person appointments. The patient expressed understanding and agreed to proceed.  History of Present Illness: Pt was referred for OP therapy by her neurologist Dr. Charlotte Crumb for depression.    Observations/Objective: Verbalize hopeful and positive statements regarding the future.  Assessment and Plan: Pt presented anxious for her individual therapy session. Pt described her psychiatric symptoms and current life events. Pt reports her family situation remains stable. Reassessed her family dynamics. When patient's family is balanced, the patient's mental and physical health remain stable.  Patient continues with her morning walk and is enjoying the cooler weather. Continue to remind patient the importance of her personal self care which influences her mental and physical well being.  Patient discussed her desire to go to [facility] to visit her sister and brother-in-law. Discussed the benefits and restrictions of travel during a pandemic. Patient was receptive to suggestion.       Follow Up Instructions:   I discussed her desire to come back into the office for therapy and she does not feel comfortable coming into the office yet. She wants to continue sessions by phone.   I discussed the assessment and treatment plan with the patient. The patient was provided an opportunity to ask questions and all were answered. The patient agreed with the plan and demonstrated an understanding of the instructions.   The patient was advised to call back or seek an in-person evaluation if the symptoms worsen or if the condition fails to improve as  anticipated.  I provided 45 minutes of non-face-to-face time during this encounter.   MACKENZIE,LISBETH S, LCAS

## 2018-11-03 ENCOUNTER — Ambulatory Visit (INDEPENDENT_AMBULATORY_CARE_PROVIDER_SITE_OTHER): Payer: Medicaid Other | Admitting: Licensed Clinical Social Worker

## 2018-11-03 ENCOUNTER — Encounter (HOSPITAL_COMMUNITY): Payer: Self-pay | Admitting: Licensed Clinical Social Worker

## 2018-11-03 ENCOUNTER — Other Ambulatory Visit: Payer: Self-pay

## 2018-11-03 DIAGNOSIS — F331 Major depressive disorder, recurrent, moderate: Secondary | ICD-10-CM

## 2018-11-03 NOTE — Progress Notes (Signed)
Virtual Visit via Telephone Note  I connected with [name] on 11/03/18 at 10:00 AM EDT by phone enabled telehealth application and verified that I am speaking with the correct person using two identifiers. Patient does not have a computer or the Internet.   I discussed the limitations of evaluation and management by telemedicine and the availability of in person appointments. The patient expressed understanding and agreed to proceed.  History of Present Illness: Pt was referred for OP therapy by her neurologist Dr. Charlotte Crumb for depression.    Observations/Objective: Verbalize hopeful and positive statements regarding the future.  Assessment and Plan: Pt presented anxious and depressed for her individual therapy session. Pt described her psychiatric symptoms and current life events. Pt reports her family situation is back unstable, because of her daughter's instability and inability to care responsibly for her 3 children. Reassessed her family dynamics and discussed with patient how her family dynamics affects her mental wellness. Discussed the importance of keeping stress free. Encouraged patient to continue with her physical self care. Reviewed tx plan and patient verbalized her acceptance of the plan.   Follow Up Instructions:   I discussed her desire to come back into the office for therapy and she does not feel comfortable coming into the office yet. She wants to continue sessions by phone.   I discussed the assessment and treatment plan with the patient. The patient was provided an opportunity to ask questions and all were answered. The patient agreed with the plan and demonstrated an understanding of the instructions.   The patient was advised to call back or seek an in-person evaluation if the symptoms worsen or if the condition fails to improve as anticipated.  I provided 45 minutes of non-face-to-face time during this encounter.   Jacquese Hackman S, LCAS

## 2018-11-17 ENCOUNTER — Ambulatory Visit (INDEPENDENT_AMBULATORY_CARE_PROVIDER_SITE_OTHER): Payer: Medicaid Other | Admitting: Licensed Clinical Social Worker

## 2018-11-17 ENCOUNTER — Other Ambulatory Visit: Payer: Self-pay

## 2018-11-17 ENCOUNTER — Encounter (HOSPITAL_COMMUNITY): Payer: Self-pay | Admitting: Licensed Clinical Social Worker

## 2018-11-17 DIAGNOSIS — F331 Major depressive disorder, recurrent, moderate: Secondary | ICD-10-CM | POA: Diagnosis not present

## 2018-11-17 NOTE — Progress Notes (Signed)
Virtual Visit via Telephone Note  I connected with [name] on 11/17/18 at 10:00 AM EDT by phone enabled telehealth application and verified that I am speaking with the correct person using two identifiers. Patient does not have a computer or the Internet.   I discussed the limitations of evaluation and management by telemedicine and the availability of in person appointments. The patient expressed understanding and agreed to proceed.  History of Present Illness: Pt was referred for OP therapy by her neurologist Dr. Charlotte Crumb for depression.    Observations/Objective: Verbalize hopeful and positive statements regarding the future.  Assessment and Plan: Pt presented anxious and depressed for her individual therapy session. Pt described her psychiatric symptoms and current life events. Pt reports her family situation is back stable again.  Reassessed her family dynamics and discussed with patient how her family dynamics affects her mental wellness. Discussed the importance of keeping stress free, due to her physical health (convulsions). Encouraged patient to continue with her self care.  Follow Up Instructions:   I discussed her desire to come back into the office for therapy and she does not feel comfortable coming into the office yet. She wants to continue sessions by phone.   I discussed the assessment and treatment plan with the patient. The patient was provided an opportunity to ask questions and all were answered. The patient agreed with the plan and demonstrated an understanding of the instructions.   The patient was advised to call back or seek an in-person evaluation if the symptoms worsen or if the condition fails to improve as anticipated.  I provided 45 minutes of non-face-to-face time during this encounter.   Madolin Twaddle S, LCAS

## 2018-11-18 ENCOUNTER — Other Ambulatory Visit: Payer: Self-pay | Admitting: Internal Medicine

## 2018-11-18 MED FILL — AMLODIPINE BESYLATE 10 MG T: 10 | 30 days supply | Qty: 30 | Fill #2

## 2018-11-18 MED FILL — MIRTAZAPINE 30 MG TABLET: 30 | 90 days supply | Qty: 90 | Fill #3

## 2018-11-18 MED FILL — LISINOPRIL 40 MG TABLET: 40 | 30 days supply | Qty: 30 | Fill #0

## 2018-11-18 MED FILL — LEVETIRACETAM ER 500 MG TAB: 500 | 30 days supply | Qty: 90 | Fill #0

## 2018-11-18 MED FILL — metFORMIN HCL 500 MG TABS: 500 | 30 days supply | Qty: 30 | Fill #2

## 2018-11-18 MED FILL — HYDROCHLOROTHIAZIDE 25 MG T: 25 | 90 days supply | Qty: 90 | Fill #3

## 2018-11-18 MED FILL — LORATADINE 10 MG TABLET: 10 | 30 days supply | Qty: 30 | Fill #2

## 2018-11-22 ENCOUNTER — Other Ambulatory Visit: Payer: Self-pay | Admitting: Internal Medicine

## 2018-11-22 DIAGNOSIS — Z1231 Encounter for screening mammogram for malignant neoplasm of breast: Secondary | ICD-10-CM

## 2018-11-24 ENCOUNTER — Other Ambulatory Visit: Payer: Self-pay

## 2018-11-24 ENCOUNTER — Ambulatory Visit (HOSPITAL_COMMUNITY): Payer: Medicaid Other | Admitting: Licensed Clinical Social Worker

## 2018-12-08 ENCOUNTER — Ambulatory Visit (INDEPENDENT_AMBULATORY_CARE_PROVIDER_SITE_OTHER): Payer: Medicaid Other | Admitting: Licensed Clinical Social Worker

## 2018-12-08 ENCOUNTER — Other Ambulatory Visit: Payer: Self-pay

## 2018-12-08 ENCOUNTER — Encounter (HOSPITAL_COMMUNITY): Payer: Self-pay | Admitting: Licensed Clinical Social Worker

## 2018-12-08 DIAGNOSIS — F331 Major depressive disorder, recurrent, moderate: Secondary | ICD-10-CM | POA: Diagnosis not present

## 2018-12-08 NOTE — Progress Notes (Signed)
Virtual Visit via Telephone Note  I connected with [name] on 12/08/18 at 10:00 AM EST by phone enabled telehealth application and verified that I am speaking with the correct person using two identifiers. Patient does not have a computer or the Internet.   I discussed the limitations of evaluation and management by telemedicine and the availability of in person appointments. The patient expressed understanding and agreed to proceed.  History of Present Illness: Pt was referred for OP therapy by her neurologist Dr. Charlotte Crumb for depression.    Observations/Objective: Verbalize hopeful and positive statements regarding the future.  Assessment and Plan: Pt presented anxious and depressed for her individual therapy session. Pt described her psychiatric symptoms and current life events. Pt reports her family situation is back stable again, which keeps her moods stable.  Pt continues her exercise regimen by walking every morning and eating healthy. Discussed the importance of self care to help with keeping her moods stable.  Discussed alternatives of self care.     Follow Up Instructions:   I discussed her desire to come back into the office for therapy and she does not feel comfortable coming into the office yet. She wants to continue sessions by phone.   I discussed the assessment and treatment plan with the patient. The patient was provided an opportunity to ask questions and all were answered. The patient agreed with the plan and demonstrated an understanding of the instructions.   The patient was advised to call back or seek an in-person evaluation if the symptoms worsen or if the condition fails to improve as anticipated.  I provided 45 minutes of non-face-to-face time during this encounter.   [name], LCAS

## 2018-12-19 ENCOUNTER — Other Ambulatory Visit: Payer: Self-pay | Admitting: Internal Medicine

## 2018-12-19 DIAGNOSIS — E119 Type 2 diabetes mellitus without complications: Secondary | ICD-10-CM

## 2018-12-19 DIAGNOSIS — I10 Essential (primary) hypertension: Secondary | ICD-10-CM

## 2018-12-19 MED FILL — LISINOPRIL 40 MG TABLET: 40 | 30 days supply | Qty: 30 | Fill #1

## 2018-12-19 MED FILL — LEVETIRACETAM ER 500 MG TAB: 500 | 30 days supply | Qty: 90 | Fill #1

## 2018-12-20 MED FILL — metFORMIN HCL 500 MG TABS: 500 | 30 days supply | Qty: 30 | Fill #0

## 2018-12-20 MED FILL — AMLODIPINE BESYLATE 10 MG T: 10 | 30 days supply | Qty: 30 | Fill #0

## 2018-12-20 MED FILL — LORATADINE 10 MG TABLET: 10 | 30 days supply | Qty: 30 | Fill #0

## 2018-12-22 ENCOUNTER — Other Ambulatory Visit: Payer: Self-pay

## 2018-12-22 ENCOUNTER — Ambulatory Visit (INDEPENDENT_AMBULATORY_CARE_PROVIDER_SITE_OTHER): Payer: Medicaid Other | Admitting: Licensed Clinical Social Worker

## 2018-12-22 ENCOUNTER — Encounter (HOSPITAL_COMMUNITY): Payer: Self-pay | Admitting: Licensed Clinical Social Worker

## 2018-12-22 DIAGNOSIS — F331 Major depressive disorder, recurrent, moderate: Secondary | ICD-10-CM

## 2018-12-22 NOTE — Progress Notes (Signed)
Virtual Visit via Telephone Note  I connected with [name] on 12/22/18 at 10:00 AM EST by phone enabled telehealth application and verified that I am speaking with the correct person using two identifiers. Patient does not have a computer or the Internet.   I discussed the limitations of evaluation and management by telemedicine and the availability of in person appointments. The patient expressed understanding and agreed to proceed.  History of Present Illness: Pt was referred for OP therapy by her neurologist Dr. Charlotte Crumb for depression.    Observations/Objective: Verbalize hopeful and positive statements regarding the future.  Assessment and Plan: Pt presented anxious and depressed for her individual therapy session. Pt described her psychiatric symptoms and current life events. Pt reports her family situation is back stable again, which keeps her moods stable. Patient discussed adjustments she's made within her family so they do not affect her mental health. Pt continues her exercise regimen by walking every morning and eating healthy. Continue to encourage patient to protect herself at all times against Covid.    Follow Up Instructions:   I discussed her desire to come back into the office for therapy and she does not feel comfortable coming into the office yet. She wants to continue sessions by phone.   I discussed the assessment and treatment plan with the patient. The patient was provided an opportunity to ask questions and all were answered. The patient agreed with the plan and demonstrated an understanding of the instructions.   The patient was advised to call back or seek an in-person evaluation if the symptoms worsen or if the condition fails to improve as anticipated.  I provided 60 minutes of non-face-to-face time during this encounter.   MACKENZIE,LISBETH S, LCAS

## 2018-12-26 ENCOUNTER — Encounter: Payer: Self-pay | Admitting: Internal Medicine

## 2018-12-26 ENCOUNTER — Ambulatory Visit: Payer: Medicaid Other | Attending: Internal Medicine | Admitting: Internal Medicine

## 2018-12-26 DIAGNOSIS — R7303 Prediabetes: Secondary | ICD-10-CM

## 2018-12-26 DIAGNOSIS — F329 Major depressive disorder, single episode, unspecified: Secondary | ICD-10-CM

## 2018-12-26 DIAGNOSIS — F32A Depression, unspecified: Secondary | ICD-10-CM

## 2018-12-26 DIAGNOSIS — I10 Essential (primary) hypertension: Secondary | ICD-10-CM | POA: Diagnosis not present

## 2018-12-26 DIAGNOSIS — E669 Obesity, unspecified: Secondary | ICD-10-CM

## 2018-12-26 DIAGNOSIS — H538 Other visual disturbances: Secondary | ICD-10-CM

## 2018-12-26 MED ORDER — AMLODIPINE BESYLATE 10 MG PO TABS: 10.0000 mg | ORAL_TABLET | Freq: Every day | ORAL | 2 refills | Status: DC

## 2018-12-26 MED ORDER — LISINOPRIL 40 MG PO TABS: 40.0000 mg | ORAL_TABLET | Freq: Every day | ORAL | 2 refills | Status: DC

## 2018-12-26 MED ORDER — HYDROCHLOROTHIAZIDE 25 MG PO TABS: 25.0000 mg | ORAL_TABLET | Freq: Every day | ORAL | 3 refills | Status: DC

## 2018-12-26 NOTE — Progress Notes (Signed)
Virtual Visit via Telephone Note Due to current restrictions/limitations of in-office visits due to the COVID-19 pandemic, this scheduled clinical appointment was converted to a telehealth visit  I connected with Kirsten Post on 12/26/18 at 12:08 p.m by telephone and verified that I am speaking with the correct person using two identifiers. I am in my office.  The patient is at home.  Only the patient and myself participated in this encounter.  I discussed the limitations, risks, security and privacy concerns of performing an evaluation and management service by telephone and the availability of in person appointments. I also discussed with the patient that there may be a patient responsible charge related to this service. The patient expressed understanding and agreed to proceed.   History of Present Illness: Pt with hx of depression, HTN, Sz dsand pseudoSZ, pre-DM, trichotillomania, internal hemorrhoids, seasonal allergies.  Last eval 07/2018  HTN: did get BP monitor but has not been checking Compliant with meds and salt restriction No CP/SOB/LE edema  PreDM/Obesity:  Compliant with Metformin.  Walking for 40 mins 6 days/wk. Eating smaller portions.  Most recent wgh is 207 down 3 lbs from last televisit and 19 lbs since beginning of this yr.   -some blurred vision.  Request referral to her ophthalmologist Dr. Gershon Crane  Depression:  Has therapy Q 2 wks  HM: due for flu shot, MMG scheduled for later this mth.  Outpatient Encounter Medications as of 12/26/2018  Medication Sig  . amLODipine (NORVASC) 10 MG tablet TAKE 1 TABLET BY MOUTH DAILY.  [name] aspirin EC 81 MG tablet Take 1 tablet (81 mg total) by mouth daily.  . Blood Pressure Monitor DEVI Use as directed to check home blood pressure 2-3 times a week  . Cholecalciferol (VITAMIN D3) 3000 units TABS Take by mouth.  . hydrochlorothiazide (HYDRODIURIL) 25 MG tablet TAKE 1 TABLET (25 MG TOTAL) BY MOUTH DAILY.  [name] levETIRAcetam (KEPPRA XR)  500 MG 24 hr tablet Take 1 tablet three times a day  . lisinopril (ZESTRIL) 40 MG tablet TAKE 1 TABLET BY MOUTH DAILY.  [name] loratadine (CLARITIN) 10 MG tablet TAKE 1 TABLET (10 MG TOTAL) BY MOUTH DAILY.  . metFORMIN (GLUCOPHAGE) 500 MG tablet TAKE 1 TABLET BY MOUTH DAILY WITH BREAKFAST.  . mirtazapine (REMERON) 30 MG tablet Take 1 tablet (30 mg total) by mouth at bedtime.   No facility-administered encounter medications on file as of 12/26/2018.     Observations/Objective: Results for orders placed or performed in visit on 05/03/18  Vitamin B12  Result Value Ref Range   Vitamin B-12 580 232 - 1,245 pg/mL  HgB A1c  Result Value Ref Range   Hemoglobin A1C     HbA1c POC (<> result, manual entry)     HbA1c, POC (prediabetic range) 6.1 5.7 - 6.4 %   HbA1c, POC (controlled diabetic range)    Glucose (CBG)  Result Value Ref Range   POC Glucose 116 (A) 70 - 99 mg/dl   Depression screen [facility] 2/9 12/26/2018 08/02/2018 05/03/2018  Decreased Interest 0 0 0  Down, Depressed, Hopeless 0 0 0  PHQ - 2 Score 0 0 0  Altered sleeping - - 0  Tired, decreased energy - - 0  Change in appetite - - 0  Feeling bad or failure about yourself  - - 0  Trouble concentrating - - 0  Moving slowly or fidgety/restless - - 3  Suicidal thoughts - - 0  PHQ-9 Score - - 3  Difficult doing work/chores - - -  Assessment and Plan: 1. Essential hypertension Level of control unknown.  Patient has new blood pressure monitor but has not been using it.  Encouraged her to check blood pressure at least twice a week and write down the numbers.  Goal is 130/80 or lower.  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  Advised her to bring her blood pressure readings on that visit - CBC; Future - Comprehensive metabolic panel; Future - Lipid panel; Future - hydrochlorothiazide (HYDRODIURIL) 25 MG tablet; Take 1 tablet (25 mg total) by mouth daily.  Dispense: 90 tablet; Refill: 3 - lisinopril (ZESTRIL) 40  MG tablet; Take 1 tablet (40 mg total) by mouth daily.  Dispense: 90 tablet; Refill: 2 - amLODipine (NORVASC) 10 MG tablet; Take 1 tablet (10 mg total) by mouth daily.  Dispense: 90 tablet; Refill: 2  2. Prediabetes Commended her on healthy eating habits and weight loss.  Encouraged her to keep up the good work with regular exercise and healthy eating. - Hemoglobin A1c; Future - Ambulatory referral to Ophthalmology  3. Obesity (BMI 30-39.9) See #2 above  4. Depression, unspecified depression type Followed by psychiatry and therapist.  5. Blurred vision - Ambulatory referral to Ophthalmology   Follow Up Instructions: 3-4 mths   I discussed the assessment and treatment plan with the patient. The patient was provided an opportunity to ask questions and all were answered. The patient agreed with the plan and demonstrated an understanding of the instructions.   The patient was advised to call back or seek an in-person evaluation if the symptoms worsen or if the condition fails to improve as anticipated.  I provided 18 minutes of non-face-to-face time during this encounter.   Jonah Blue, MD

## 2018-12-29 ENCOUNTER — Ambulatory Visit: Payer: Medicaid Other | Admitting: Pharmacist

## 2019-01-04 ENCOUNTER — Other Ambulatory Visit: Payer: Self-pay

## 2019-01-04 ENCOUNTER — Encounter (HOSPITAL_COMMUNITY): Payer: Self-pay | Admitting: Licensed Clinical Social Worker

## 2019-01-04 ENCOUNTER — Ambulatory Visit (INDEPENDENT_AMBULATORY_CARE_PROVIDER_SITE_OTHER): Payer: Medicaid Other | Admitting: Licensed Clinical Social Worker

## 2019-01-04 DIAGNOSIS — F331 Major depressive disorder, recurrent, moderate: Secondary | ICD-10-CM | POA: Diagnosis not present

## 2019-01-04 NOTE — Progress Notes (Signed)
Virtual Visit via Telephone Note  I connected with [name] on 01/04/19 at 11:00 AM EST by phone enabled telehealth application and verified that I am speaking with the correct person using two identifiers. Patient does not have a computer or the Internet.   I discussed the limitations of evaluation and management by telemedicine and the availability of in person appointments. The patient expressed understanding and agreed to proceed.  History of Present Illness: Pt was referred for OP therapy by her neurologist Dr. Charlotte Crumb for depression.    Observations/Objective: Verbalize hopeful and positive statements regarding the future.  Assessment and Plan: Pt presented  depressed for her individual therapy session. Pt described her psychiatric symptoms and current life events. Pt reports her moods have been stable but her depressive symptoms have returned recently. Used socratic questions and discussed coping skills. Patient reports her family situation continues to be stable which keeps her moods mostly stable. Patient'[name] blood pressure has increased and she got a blood pressure cuff to use at home, however, she forgets on occassion. Assisted patient with strategies to remember to check her BP.    Follow Up Instructions:   I discussed her desire to come back into the office for therapy and she does not feel comfortable coming into the office yet. She wants to continue sessions by phone.   I discussed the assessment and treatment plan with the patient. The patient was provided an opportunity to ask questions and all were answered. The patient agreed with the plan and demonstrated an understanding of the instructions.   The patient was advised to call back or seek an in-person evaluation if the symptoms worsen or if the condition fails to improve as anticipated.  I provided 60 minutes of non-face-to-face time during this encounter.   [name], LCAS

## 2019-01-05 ENCOUNTER — Ambulatory Visit: Payer: Self-pay | Admitting: Internal Medicine

## 2019-01-17 ENCOUNTER — Ambulatory Visit
Admission: RE | Admit: 2019-01-17 | Discharge: 2019-01-17 | Disposition: A | Discharge status: Home / Self Care | Payer: Medicaid Other | Source: Ambulatory Visit | Attending: Internal Medicine | Admitting: Internal Medicine

## 2019-01-17 ENCOUNTER — Other Ambulatory Visit: Payer: Self-pay

## 2019-01-17 DIAGNOSIS — Z1231 Encounter for screening mammogram for malignant neoplasm of breast: Secondary | ICD-10-CM

## 2019-01-18 ENCOUNTER — Ambulatory Visit (HOSPITAL_COMMUNITY): Payer: Medicaid Other | Admitting: Licensed Clinical Social Worker

## 2019-01-19 ENCOUNTER — Other Ambulatory Visit: Payer: Self-pay

## 2019-01-19 ENCOUNTER — Ambulatory Visit (HOSPITAL_COMMUNITY): Payer: Medicaid Other | Admitting: Licensed Clinical Social Worker

## 2019-01-24 ENCOUNTER — Other Ambulatory Visit: Payer: Self-pay

## 2019-01-24 ENCOUNTER — Encounter (HOSPITAL_COMMUNITY): Payer: Self-pay | Admitting: Licensed Clinical Social Worker

## 2019-01-24 ENCOUNTER — Ambulatory Visit (INDEPENDENT_AMBULATORY_CARE_PROVIDER_SITE_OTHER): Payer: Medicaid Other | Admitting: Licensed Clinical Social Worker

## 2019-01-24 DIAGNOSIS — F331 Major depressive disorder, recurrent, moderate: Secondary | ICD-10-CM

## 2019-01-24 NOTE — Progress Notes (Signed)
Virtual Visit via Telephone Note  I connected with [name] on 01/24/19 at 10:00 AM EST by phone enabled telehealth application and verified that I am speaking with the correct person using two identifiers. Patient does not have a computer or the Internet.   I discussed the limitations of evaluation and management by telemedicine and the availability of in person appointments. The patient expressed understanding and agreed to proceed.  History of Present Illness: Pt was referred for OP therapy by her neurologist Dr. [place] Lions for depression.    Observations/Objective: Verbalize hopeful and positive statements regarding the future.Pt presented  depressed for her individual therapy session. Pt described her psychiatric symptoms and current life events. Pt reports her mood is depressed due to again, her conflictual family relationships. Since this conflict has continued she has had a "spell" where she became dizzy, while outside walking. Helped patient understand how her family conflict affects her mental and physical health.  Gave patient a platform to unpack her feelings about her family stressors and suggestions for family conflict, protecting her mental and physical health.   Assessment and plan: Counselor will continue to meet with patient to address treatment plan goals. Patient will continue to follow recommendations of providers and implement skills learned in session.    Follow Up Instructions:   I discussed her desire to come back into the office for therapy and she does not feel comfortable coming into the office yet. She wants to continue sessions by phone.   I discussed the assessment and treatment plan with the patient. The patient was provided an opportunity to ask questions and all were answered. The patient agreed with the plan and demonstrated an understanding of the instructions.   The patient was advised to call back or seek an in-person evaluation if the symptoms worsen  or if the condition fails to improve as anticipated.  I provided 60 minutes of non-face-to-face time during this encounter.   Vana Arif S, LCAS

## 2019-01-31 ENCOUNTER — Ambulatory Visit (HOSPITAL_COMMUNITY): Payer: Medicaid Other | Admitting: Licensed Clinical Social Worker

## 2019-01-31 ENCOUNTER — Encounter (HOSPITAL_COMMUNITY): Payer: Self-pay | Admitting: Licensed Clinical Social Worker

## 2019-01-31 ENCOUNTER — Other Ambulatory Visit: Payer: Self-pay

## 2019-01-31 DIAGNOSIS — F331 Major depressive disorder, recurrent, moderate: Secondary | ICD-10-CM

## 2019-01-31 NOTE — Progress Notes (Signed)
Virtual Visit via Telephone Note  I connected with [name] on 01/31/19 at 11:00 AM EST by phone enabled telehealth application and verified that I am speaking with the correct person using two identifiers. Patient does not have a computer or the Internet.   I discussed the limitations of evaluation and management by telemedicine and the availability of in person appointments. The patient expressed understanding and agreed to proceed.  History of Present Illness: Pt was referred for OP therapy by her neurologist Dr. [place] Lions for depression.    Observations/Objective: Verbalize hopeful and positive statements regarding the future.Pt presented  depressed for her individual therapy session. Pt described her psychiatric symptoms and current life events. Pt reports her mood is depressed due to again, her conflictual family relationships. Patient described her conflictual family relationships which leads to her frustration, sadness. Continued to suggest patient use self care when dealing with her family dysfunction.  Assessment and plan: Counselor will continue to meet with patient to address treatment plan goals. Patient will continue to follow recommendations of providers and implement skills learned in session.    Follow Up Instructions: I discussed her desire to come back into the office for therapy and she does not feel comfortable coming into the office yet. She wants to continue sessions by phone.   I discussed the assessment and treatment plan with the patient. The patient was provided an opportunity to ask questions and all were answered. The patient agreed with the plan and demonstrated an understanding of the instructions.   The patient was advised to call back or seek an in-person evaluation if the symptoms worsen or if the condition fails to improve as anticipated.  I provided 60 minutes of non-face-to-face time during this encounter.   Ammaar Encina S, LCAS

## 2019-02-09 ENCOUNTER — Other Ambulatory Visit: Payer: Self-pay | Admitting: Internal Medicine

## 2019-02-09 DIAGNOSIS — E119 Type 2 diabetes mellitus without complications: Secondary | ICD-10-CM

## 2019-02-09 MED FILL — HYDROCHLOROTHIAZIDE 25 MG T: 25 | 30 days supply | Qty: 30 | Fill #0

## 2019-02-09 MED FILL — MIRTAZAPINE 30 MG TABLET: 30 | 90 days supply | Qty: 90 | Fill #4

## 2019-02-09 MED FILL — LEVETIRACETAM ER 500 MG TAB: 500 | 30 days supply | Qty: 90 | Fill #2

## 2019-02-09 MED FILL — AMLODIPINE BESYLATE 10 MG T: 10 | 90 days supply | Qty: 90 | Fill #0

## 2019-02-09 MED FILL — LISINOPRIL 40 MG TABLET: 40 | 30 days supply | Qty: 30 | Fill #0

## 2019-02-14 ENCOUNTER — Encounter (HOSPITAL_COMMUNITY): Payer: Self-pay | Admitting: Licensed Clinical Social Worker

## 2019-02-14 ENCOUNTER — Other Ambulatory Visit: Payer: Self-pay

## 2019-02-14 ENCOUNTER — Ambulatory Visit (INDEPENDENT_AMBULATORY_CARE_PROVIDER_SITE_OTHER): Payer: Medicaid Other | Admitting: Licensed Clinical Social Worker

## 2019-02-14 DIAGNOSIS — F331 Major depressive disorder, recurrent, moderate: Secondary | ICD-10-CM

## 2019-02-14 NOTE — Progress Notes (Signed)
Comprehensive Clinical Assessment (CCA) Note  02/14/2019 [name] [id number]  Visit Diagnosis:      ICD-10-CM   1. Major depressive disorder, recurrent episode, moderate (HCC)  F33.1       CCA Part One  Part One has been completed on paper by the patient.  (See scanned document in Chart Review)  CCA Part Two A  Intake/Chief Complaint:  CCA Intake With Chief Complaint CCA Part Two Date: 02/14/19 CCA Part Two Time: 1006 Chief Complaint/Presenting Problem: Patient continues to have stressors in her life and experiences anxiety and depression Patients Currently Reported Symptoms/Problems: anxious and depressed Collateral Involvement: dr and therapy notes Individual's Strengths: motivated, family support Individual's Preferences: prefers to not have these symptoms Individual's Abilities: ability to work through her symptoms Type of Services Patient Feels Are Needed: OP services  Mental Health Symptoms Depression:  Depression: Change in energy/activity, Fatigue, Irritability  Mania:  Mania: N/A  Anxiety:   Anxiety: Fatigue, Irritability, Worrying  Psychosis:  Psychosis: N/A  Trauma:  Trauma: Avoids reminders of event, Guilt/shame  Obsessions:  Obsessions: N/A  Compulsions:  Compulsions: N/A  Inattention:  Inattention: N/A  Hyperactivity/Impulsivity:  Hyperactivity/Impulsivity: N/A  Oppositional/Defiant Behaviors:  Oppositional/Defiant Behaviors: N/A  Borderline Personality:  Emotional Irregularity: N/A  Other Mood/Personality Symptoms:      Mental Status Exam Appearance and self-care  Stature:  Stature: Average  Weight:  Weight: Average weight  Clothing:  Clothing: Casual  Grooming:  Grooming: Normal  Cosmetic use:  Cosmetic Use: None  Posture/gait:  Posture/Gait: Normal  Motor activity:  Motor Activity: Not Remarkable  Sensorium  Attention:  Attention: Normal  Concentration:  Concentration: Anxiety interferes  Orientation:  Orientation: X5  Recall/memory:   Recall/Memory: Defective in short-term  Affect and Mood  Affect:  Affect: Depressed  Mood:  Mood: Depressed  Relating  Eye contact:  Eye Contact: Normal  Facial expression:  Facial Expression: Responsive  Attitude toward examiner:  Attitude Toward Examiner: Cooperative  Thought and Language  Speech flow: Speech Flow: Normal  Thought content:  Thought Content: Appropriate to mood and circumstances  Preoccupation:  Preoccupations: Ruminations  Hallucinations:     Organization:     Company secretary of Knowledge:  Fund of Knowledge: Impoverished by:  (Comment)  Intelligence:  Intelligence: Below average  Abstraction:  Abstraction: Normal  Judgement:  Judgement: Normal  Reality Testing:  Reality Testing: Adequate  Insight:  Insight: Fair  Decision Making:  Decision Making: Normal  Social Functioning  Social Maturity:  Social Maturity: Responsible  Social Judgement:  Social Judgement: Naive  Stress  Stressors:  Stressors: Family conflict  Coping Ability:  Coping Ability: Deficient supports  Skill Deficits:     Supports:      Family and Psychosocial History: Family history Marital status: Single Does patient have children?: Yes How many children?: 1 How is patient's relationship with their children?: patient's daughter causes her stress  Childhood History:  Childhood History By whom was/is the patient raised?: Both parents Additional childhood history information: both parents, every summer we were sent to [place] to stay with grandmother until age 54 Description of patient's relationship with caregiver when they were a child: good relationship with both parents Patient's description of current relationship with people who raised him/her: both deceased How were you disciplined when you got in trouble as a child/adolescent?: whipping Does patient have siblings?: Yes Number of Siblings: 2 Description of patient's current relationship with siblings: excellent  relationship with sister who lives in [place], brother addict  Did patient suffer any verbal/emotional/physical/sexual abuse as a child?: No Did patient suffer from severe childhood neglect?: No Has patient ever been sexually abused/assaulted/raped as an adolescent or adult?: No Was the patient ever a victim of a crime or a disaster?: No Witnessed domestic violence?: No Has patient been effected by domestic violence as an adult?: No  CCA Part Two B  Employment/Work Situation: Employment / Work Psychologist, occupational Employment situation: On disability Why is patient on disability: seizures and mental illness How long has patient been on disability: 4 years Patient's job has been impacted by current illness: No What is the longest time patient has a held a job?: pt has never held a job due to her seizures Did You Receive Any Psychiatric Treatment/Services While in Equities trader?: No Are There Guns or Other Weapons in Your Home?: No  Education: Education Last Grade Completed: 12 Did Garment/textile technologist From [employer]?: Yes Did Theme park manager?: No Did Designer, television/film set?: No Did You Have An Individualized Education Program (IIEP): Yes Did You Have Any Difficulty At School?: Yes Were Any Medications Ever Prescribed For These Difficulties?: No  Religion: Religion/Spirituality Are You A Religious Person?: Yes  Leisure/Recreation: Leisure / Recreation Leisure and Hobbies: walking, seeing family  Exercise/Diet: Exercise/Diet Do You Exercise?: Yes What Type of Exercise Do You Do?: Run/Walk How Many Times a Week Do You Exercise?: Daily Have You Gained or Lost A Significant Amount of Weight in the Past Six Months?: Yes-Lost Number of Pounds Lost?: 20 Do You Follow a Special Diet?: No Do You Have Any Trouble Sleeping?: No  CCA Part Two C  Alcohol/Drug Use: Alcohol / Drug Use History of alcohol / drug use?: No history of alcohol / drug abuse                      CCA Part  Three  ASAM's:  Six Dimensions of Multidimensional Assessment  Dimension 1:  Acute Intoxication and/or Withdrawal Potential:     Dimension 2:  Biomedical Conditions and Complications:     Dimension 3:  Emotional, Behavioral, or Cognitive Conditions and Complications:     Dimension 4:  Readiness to Change:     Dimension 5:  Relapse, Continued use, or Continued Problem Potential:     Dimension 6:  Recovery/Living Environment:      Substance use Disorder (SUD)    Social Function:  Social Functioning Social Maturity: Responsible Social Judgement: Naive  Stress:  Stress Stressors: Family conflict Coping Ability: Deficient supports Patient Takes Medications The Way The Doctor Instructed?: Yes Priority Risk: Low Acuity  Risk Assessment- Self-Harm Potential: Risk Assessment For Self-Harm Potential Thoughts of Self-Harm: No current thoughts Method: No plan Availability of Means: No access/NA  Risk Assessment -Dangerous to Others Potential: Risk Assessment For Dangerous to Others Potential Method: No Plan Availability of Means: No access or NA Intent: Vague intent or NA Notification Required: No need or identified person  DSM5 Diagnoses: Patient Active Problem List   Diagnosis Date Noted  . Adjustment disorder with anxious mood 08/18/2018  . Obesity (BMI 30-39.9) 09/17/2016  . Internal hemorrhoid 09/16/2016  . Adjustment disorder with mixed anxiety and depressed mood 12/04/2015  . Primary insomnia 12/04/2015  . Prediabetes 10/09/2013  . Facial droop 10/09/2013  . Seizures (HCC) 08/24/2013  . Obesity, unspecified 10/17/2007  . 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 PES PLANUS, RIGHT 07/20/2007  . DYSTONIA 01/04/2007  . Major depressive disorder, recurrent episode, moderate (HCC) 03/18/2006  . Essential hypertension  03/18/2006  . PSORIASIS 03/18/2006    Patient Centered Plan: Patient is on the following Treatment Plan(s):  Reviewed tx plan with patient who verbalized her  acceptance of the tx plan  Recommendations for Services/Supports/Treatments: Recommendations for Services/Supports/Treatments Recommendations For Services/Supports/Treatments: Individual Therapy, Medication Management  Treatment Plan Summary: OP Treatment Plan Summary: I want to feel less depressed  Referrals to Alternative Service(s): Referred to Alternative Service(s):   Place:   Date:   Time:    Referred to Alternative Service(s):   Place:   Date:   Time:    Referred to Alternative Service(s):   Place:   Date:   Time:    Referred to Alternative Service(s):   Place:   Date:   Time:     Jenkins Rouge

## 2019-02-15 ENCOUNTER — Other Ambulatory Visit: Payer: Self-pay

## 2019-02-15 ENCOUNTER — Ambulatory Visit: Payer: Medicaid Other | Attending: Internal Medicine

## 2019-02-15 ENCOUNTER — Ambulatory Visit (HOSPITAL_BASED_OUTPATIENT_CLINIC_OR_DEPARTMENT_OTHER): Payer: Medicaid Other | Admitting: Pharmacist

## 2019-02-15 DIAGNOSIS — Z23 Encounter for immunization: Secondary | ICD-10-CM

## 2019-02-15 DIAGNOSIS — I10 Essential (primary) hypertension: Secondary | ICD-10-CM | POA: Diagnosis not present

## 2019-02-15 DIAGNOSIS — R7303 Prediabetes: Secondary | ICD-10-CM | POA: Diagnosis not present

## 2019-02-15 NOTE — Progress Notes (Signed)
Patient presents for vaccination against influenza per orders of Dr. Johnson. Consent given. Counseling provided. No contraindications exists. Vaccine administered without incident.   

## 2019-02-16 ENCOUNTER — Telehealth (HOSPITAL_COMMUNITY): Payer: Self-pay

## 2019-02-16 ENCOUNTER — Encounter (HOSPITAL_COMMUNITY): Payer: Self-pay | Admitting: Psychiatry

## 2019-02-16 ENCOUNTER — Ambulatory Visit (INDEPENDENT_AMBULATORY_CARE_PROVIDER_SITE_OTHER): Payer: Medicaid Other | Admitting: Psychiatry

## 2019-02-16 DIAGNOSIS — F5101 Primary insomnia: Secondary | ICD-10-CM | POA: Diagnosis not present

## 2019-02-16 DIAGNOSIS — F331 Major depressive disorder, recurrent, moderate: Secondary | ICD-10-CM

## 2019-02-16 LAB — CBC
Hematocrit: 44.9 % (ref 34.0–46.6)
Hemoglobin: 14.6 g/dL (ref 11.1–15.9)
MCH: 25.7 pg — ABNORMAL LOW (ref 26.6–33.0)
MCHC: 32.5 g/dL (ref 31.5–35.7)
MCV: 79 fL (ref 79–97)
Platelets: 347 10*3/uL (ref 150–450)
RBC: 5.68 x10E6/uL — ABNORMAL HIGH (ref 3.77–5.28)
RDW: 16 % — ABNORMAL HIGH (ref 11.7–15.4)
WBC: 8 10*3/uL (ref 3.4–10.8)

## 2019-02-16 LAB — COMPREHENSIVE METABOLIC PANEL
ALT: 18 IU/L (ref 0–32)
AST: 29 IU/L (ref 0–40)
Albumin/Globulin Ratio: 1.5 (ref 1.2–2.2)
Albumin: 4.7 g/dL (ref 3.8–4.8)
Alkaline Phosphatase: 145 IU/L — ABNORMAL HIGH (ref 39–117)
BUN/Creatinine Ratio: 14 (ref 12–28)
BUN: 13 mg/dL (ref 8–27)
Bilirubin Total: 0.5 mg/dL (ref 0.0–1.2)
CO2: 23 mmol/L (ref 20–29)
Calcium: 10.7 mg/dL — ABNORMAL HIGH (ref 8.7–10.3)
Chloride: 96 mmol/L (ref 96–106)
Creatinine, Ser: 0.91 mg/dL (ref 0.57–1.00)
GFR calc Af Amer: 79 mL/min/{1.73_m2} (ref >59)
GFR calc non Af Amer: 68 mL/min/{1.73_m2} (ref >59)
Globulin, Total: 3.2 g/dL (ref 1.5–4.5)
Glucose: 77 mg/dL (ref 65–99)
Potassium: 3.9 mmol/L (ref 3.5–5.2)
Sodium: 135 mmol/L (ref 134–144)
Total Protein: 7.9 g/dL (ref 6.0–8.5)

## 2019-02-16 LAB — LIPID PANEL
Chol/HDL Ratio: 2.5 ratio (ref 0.0–4.4)
Cholesterol, Total: 145 mg/dL (ref 100–199)
HDL: 58 mg/dL (ref >39)
LDL Chol Calc (NIH): 68 mg/dL (ref 0–99)
Triglycerides: 102 mg/dL (ref 0–149)
VLDL Cholesterol Cal: 19 mg/dL (ref 5–40)

## 2019-02-16 LAB — HEMOGLOBIN A1C
Est. average glucose Bld gHb Est-mCnc: 128 mg/dL
Hgb A1c MFr Bld: 6.1 % — ABNORMAL HIGH (ref 4.8–5.6)

## 2019-02-16 MED ORDER — MIRTAZAPINE 30 MG PO TABS: 30.0000 mg | ORAL_TABLET | Freq: Every day | ORAL | 1 refills | Status: DC

## 2019-02-16 NOTE — Progress Notes (Signed)
Virtual Visit via Telephone Note  I connected with [name] on 02/16/19 at 10:00 AM EST by telephone and verified that I am speaking with the correct person using two identifiers.  Location: Patient: home Provider: office   I discussed the limitations, risks, security and privacy concerns of performing an evaluation and management service by telephone and the availability of in person appointments. I also discussed with the patient that there may be a patient responsible charge related to this service. The patient expressed understanding and agreed to proceed.   History of Present Illness: [name] reports she is doing well. She is praying and doing daily affirmations. It helps a lot. The Remeron is very effective for sleep. She takes it at dinner. After one 1 hr she is sleepy and will sleep for 5-6 hrs. [name] has a long time habit of praying at 3am. She will stay up after that. [name] makes an effort to stay away from negative and toxic people. She is denying symptoms of depression. [name] denies SI/HI.    Observations/Objective: General Appearance: unable to assess  Eye Contact:  unable to assess  Speech:  Clear and Coherent and Slow  Volume:  Normal  Mood:  Euthymic  Affect:  Full Range  Thought Process:  Coherent and Descriptions of Associations: Circumstantial  Orientation:  Full (Time, Place, and Person)  Thought Content:  Logical  Suicidal Thoughts:  No  Homicidal Thoughts:  No  Memory:  Immediate;   Good  Judgement:  Good  Insight:  Good  Psychomotor Activity:  unable to assess  Concentration:  Concentration: Good  Recall:  Good  Fund of Knowledge:  Good  Language:  Good  Akathisia:  unable to assess  Handed:  Right  AIMS (if indicated):     Assets:  Communication Skills Desire for Improvement Financial Resources/Insurance Housing Leisure Time Talents/Skills Transportation  ADL's:  Unable to assess  Cognition:  WNL  Sleep:        Assessment and Plan:  MDD-  recurrent, moderate; Primary Insomnia  Status of current symptoms: stable  Remeron 30mg  po qHS  Encouraged to continue therapy   I reviewed labs done on 02/15/19- HbA1c is 6.1. she is working with her PCP.  Follow Up Instructions: In 6 months or sooner if needed    I discussed the assessment and treatment plan with the patient. The patient was provided an opportunity to ask questions and all were answered. The patient agreed with the plan and demonstrated an understanding of the instructions.   The patient was advised to call back or seek an in-person evaluation if the symptoms worsen or if the condition fails to improve as anticipated.  I provided 15 minutes of non-face-to-face time during this encounter.   02/17/19, MD

## 2019-02-16 NOTE — Telephone Encounter (Signed)
Patient called and stated that she didn't tell you everything at her appointment thismorning. She stated that her daughter is still mad at her because she stopped babysitting her grandchildren. Also on 01/19/19, she pulled out her hair, took a razor and shaved off all her hair (entire head). Then on 1/21 she had a talk with her therapist and told her she was stressed out due to doing bloodwork yesterday. It sounded like the info was fyi and patient just wanted to let you know. Thank you.

## 2019-02-19 ENCOUNTER — Other Ambulatory Visit: Payer: Self-pay | Admitting: Internal Medicine

## 2019-02-21 ENCOUNTER — Telehealth: Payer: Self-pay

## 2019-02-21 NOTE — Telephone Encounter (Signed)
Contacted pt to go over lab results pt didn't answer and was unable to lvm due to vm not being set up 

## 2019-02-24 ENCOUNTER — Other Ambulatory Visit: Payer: Self-pay | Admitting: Internal Medicine

## 2019-02-24 DIAGNOSIS — E119 Type 2 diabetes mellitus without complications: Secondary | ICD-10-CM

## 2019-02-24 MED FILL — PREVIDENT 5000 BOOSTER PLUS: 1.1 | 30 days supply | Qty: 100 | Fill #0

## 2019-02-27 MED FILL — PREVIDENT 5000 BOOSTER PLUS: 1.1 | 30 days supply | Qty: 100 | Fill #0

## 2019-02-28 ENCOUNTER — Other Ambulatory Visit: Payer: Self-pay

## 2019-02-28 ENCOUNTER — Telehealth: Payer: Self-pay | Admitting: Neurology

## 2019-02-28 ENCOUNTER — Encounter (HOSPITAL_COMMUNITY): Payer: Self-pay | Admitting: Licensed Clinical Social Worker

## 2019-02-28 ENCOUNTER — Ambulatory Visit (INDEPENDENT_AMBULATORY_CARE_PROVIDER_SITE_OTHER): Payer: Medicaid Other | Admitting: Licensed Clinical Social Worker

## 2019-02-28 ENCOUNTER — Telehealth: Payer: Self-pay | Admitting: Internal Medicine

## 2019-02-28 DIAGNOSIS — F331 Major depressive disorder, recurrent, moderate: Secondary | ICD-10-CM | POA: Diagnosis not present

## 2019-02-28 NOTE — Progress Notes (Signed)
Virtual Visit via Telephone Note  I connected with [name] on 02/28/19 at 10:00 AM EST by phone enabled telehealth application and verified that I am speaking with the correct person using two identifiers. Patient does not have a computer or the Internet.   I discussed the limitations of evaluation and management by telemedicine and the availability of in person appointments. The patient expressed understanding and agreed to proceed.  History of Present Illness: Pt was referred for OP therapy by her neurologist Dr. [place] Lions for depression.   Observations/Objective: Verbalize hopeful and positive statements regarding the future.Pt presented  depressed for her individual therapy session. Pt described her psychiatric symptoms and current life events. 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 Discussed alternative coping skills with patient. Patient reports she has concerns about getting the COVID vaccine. Suggested she call her neurologist to see if it's safe for her to get it. Patient still hasn't received her lab tests results. Suggested she call her PCP to get results.   PLAN: childhood memories  Assessment and plan: Counselor will continue to meet with patient to address treatment plan goals. Patient will continue to follow recommendations of providers and implement skills learned in session.  Follow Up Instructions: I discussed her desire to come back into the office for therapy and she does not feel comfortable coming into the office yet. She wants to continue sessions by phone.   I discussed the assessment and treatment plan with the patient. The patient was provided an opportunity to ask questions and all were answered. The patient agreed with the plan and demonstrated an understanding of the instructions.   The patient was advised to call back or seek an  in-person evaluation if the symptoms worsen or if the condition fails to improve as anticipated.  I provided 60 minutes of non-face-to-face time during this encounter.   Campbell Kray S, LCAS

## 2019-02-28 NOTE — Telephone Encounter (Signed)
Patient called regarding needing to get advise from Dr. Karel Jarvis about if she should get her covid shot? She said that she has Diabetic Neuropathy and Epilepsy. She just wants to make sure. Please Call. Thank you

## 2019-02-28 NOTE — Telephone Encounter (Signed)
Patient called requesting her lab results from 02/15/19. Pt.is also requesting a refill on Metformin and Claritin. Patient would also like to be referred out to a speech therapist because she has problems pronouncing her words.

## 2019-02-28 NOTE — Telephone Encounter (Signed)
Returned pt call pt is aware of lab results and refilled medication.  Pt is needing a referral to speech therapy

## 2019-02-28 NOTE — Telephone Encounter (Signed)
Patient advised to contact [place] [facility] de[facility] for guidelines

## 2019-03-07 ENCOUNTER — Other Ambulatory Visit: Payer: Self-pay

## 2019-03-07 ENCOUNTER — Ambulatory Visit: Payer: Medicaid Other | Attending: Internal Medicine | Admitting: Internal Medicine

## 2019-03-07 DIAGNOSIS — R471 Dysarthria and anarthria: Secondary | ICD-10-CM

## 2019-03-07 DIAGNOSIS — R7303 Prediabetes: Secondary | ICD-10-CM

## 2019-03-07 DIAGNOSIS — Z7189 Other specified counseling: Secondary | ICD-10-CM

## 2019-03-07 MED ORDER — METFORMIN HCL 500 MG PO TABS: 500.0000 mg | ORAL_TABLET | Freq: Every day | ORAL | 5 refills | Status: DC

## 2019-03-07 MED ORDER — LORATADINE 10 MG PO TABS: 10.0000 mg | ORAL_TABLET | Freq: Every day | ORAL | 2 refills | Status: DC

## 2019-03-07 MED FILL — LORATADINE 10 MG TABLET: 10 | 30 days supply | Qty: 30 | Fill #0

## 2019-03-07 MED FILL — metFORMIN HCL 500 MG TABS: 500 | 30 days supply | Qty: 30 | Fill #0

## 2019-03-07 NOTE — Progress Notes (Signed)
Pt states since she was little she has been having trouble pronounce words

## 2019-03-07 NOTE — Progress Notes (Signed)
Virtual Visit via Telephone Note Due to current restrictions/limitations of in-office visits due to the COVID-19 pandemic, this scheduled clinical appointment was converted to a telehealth visit  I connected with [name] on 03/07/19 at 9:28 a.m EST by telephone and verified that I am speaking with the correct person using two identifiers. I am in my office.  The patient is at home.  Only the patient and myself participated in this encounter.  I discussed the limitations, risks, security and privacy concerns of performing an evaluation and management service by telephone and the availability of in person appointments. I also discussed with the patient that there may be a patient responsible charge related to this service. The patient expressed understanding and agreed to proceed.   History of Present Illness: Pt with hx of depression, HTN, Sz dsand pseudoSZ, pre-DM, trichotillomania, internal hemorrhoids, seasonal allergies.   Purpose of today's visit is urgent care to request referral to a speech therapist.  Pt requesting referral to see a speech therapist.  Reports every since she was small she had problems pronouncing words and getting words out.  Family notice that she still has this problem.  Pt wanted to know if she should receive COVID vaccine. Concern that she may have S.E.  She is afraid to get the vaccine.   Pt wanted me to go over lab results with her from labs done last mth.  My CMA did call her with the results but she wanted me to go over them with her again.  My lab note was as follows:  Let pt know that her A1C is 6.1 which is still in the range for prediabetes. Continue Metformin, healthy eating and regular exercise. LDL cholesterol is normal. Kidney and LFTs are okay. Calcium level is elevated which can be due to HCTZ (Hydrochlorothiazide). However, we need to do additional studies to rule out other causes. Please return to the lab at her convenience to have additional  tests done. Her blood count is normal meaning no anemia.  She is requesting refill on Metformin and loratadine. Outpatient Encounter Medications as of 03/07/2019  Medication Sig  . amLODipine (NORVASC) 10 MG tablet Take 1 tablet (10 mg total) by mouth daily.  [name] aspirin EC 81 MG tablet Take 1 tablet (81 mg total) by mouth daily.  . Blood Pressure Monitor DEVI Use as directed to check home blood pressure 2-3 times a week  . Cholecalciferol (VITAMIN D3) 3000 units TABS Take by mouth.  . hydrochlorothiazide (HYDRODIURIL) 25 MG tablet Take 1 tablet (25 mg total) by mouth daily.  [name] levETIRAcetam (KEPPRA XR) 500 MG 24 hr tablet Take 1 tablet three times a day  . lisinopril (ZESTRIL) 40 MG tablet Take 1 tablet (40 mg total) by mouth daily.  [name] loratadine (CLARITIN) 10 MG tablet TAKE 1 TABLET (10 MG TOTAL) BY MOUTH DAILY.  . metFORMIN (GLUCOPHAGE) 500 MG tablet TAKE 1 TABLET BY MOUTH DAILY WITH BREAKFAST.  . mirtazapine (REMERON) 30 MG tablet Take 1 tablet (30 mg total) by mouth at bedtime.   No facility-administered encounter medications on file as of 03/07/2019.    Observations/Objective: Results for orders placed or performed in visit on 02/15/19  Hemoglobin A1c  Result Value Ref Range   Hgb A1c MFr Bld 6.1 (H) 4.8 - 5.6 %   Est. average glucose Bld gHb Est-mCnc 128 mg/dL  Lipid panel  Result Value Ref Range   Cholesterol, Total 145 100 - 199 mg/dL   Triglycerides 676 0 - 149  mg/dL   HDL 58 >39 mg/dL   VLDL Cholesterol Cal 19 5 - 40 mg/dL   LDL Chol Calc (NIH) 68 0 - 99 mg/dL   Chol/HDL Ratio 2.5 0.0 - 4.4 ratio  Comprehensive metabolic panel  Result Value Ref Range   Glucose 77 65 - 99 mg/dL   BUN 13 8 - 27 mg/dL   Creatinine, Ser 0.91 0.57 - 1.00 mg/dL   GFR calc non Af Amer 68 >59 mL/min/1.73   GFR calc Af Amer 79 >59 mL/min/1.73   BUN/Creatinine Ratio 14 12 - 28   Sodium 135 134 - 144 mmol/L   Potassium 3.9 3.5 - 5.2 mmol/L   Chloride 96 96 - 106 mmol/L   CO2 23 20 - 29 mmol/L    Calcium 10.7 (H) 8.7 - 10.3 mg/dL   Total Protein 7.9 6.0 - 8.5 g/dL   Albumin 4.7 3.8 - 4.8 g/dL   Globulin, Total 3.2 1.5 - 4.5 g/dL   Albumin/Globulin Ratio 1.5 1.2 - 2.2   Bilirubin Total 0.5 0.0 - 1.2 mg/dL   Alkaline Phosphatase 145 (H) 39 - 117 IU/L   AST 29 0 - 40 IU/L   ALT 18 0 - 32 IU/L  CBC  Result Value Ref Range   WBC 8.0 3.4 - 10.8 x10E3/uL   RBC 5.68 (H) 3.77 - 5.28 x10E6/uL   Hemoglobin 14.6 11.1 - 15.9 g/dL   Hematocrit 44.9 34.0 - 46.6 %   MCV 79 79 - 97 fL   MCH 25.7 (L) 26.6 - 33.0 pg   MCHC 32.5 31.5 - 35.7 g/dL   RDW 16.0 (H) 11.7 - 15.4 %   Platelets 347 150 - 450 x10E3/uL     Assessment and Plan: 1. Difficulty articulating words - Ambulatory referral to Speech Therapy  2. Educated about COVID-19 virus infection Advised and encourage patient to get the COVID-19 vaccine.  Went over possible side effects including soreness at the injection site, low-grade temperature, muscle aches and headaches that can last from 12 to 24 hours.  She was pleased to know that I have received the vaccine already.  Encouraged her to continue wearing a mask when in public, practice social distancing and frequent handwashing.  3. Prediabetes - metFORMIN (GLUCOPHAGE) 500 MG tablet; Take 1 tablet (500 mg total) by mouth daily with breakfast.  Dispense: 30 tablet; Refill: 5  4. Hypercalcemia Patient will come to the lab to have blood test done as ordered.   Follow Up Instructions: As scheduled for April.   I discussed the assessment and treatment plan with the patient. The patient was provided an opportunity to ask questions and all were answered. The patient agreed with the plan and demonstrated an understanding of the instructions.   The patient was advised to call back or seek an in-person evaluation if the symptoms worsen or if the condition fails to improve as anticipated.  I provided 11 minutes of non-face-to-face time during this encounter.   Karle Plumber,  MD

## 2019-03-14 ENCOUNTER — Encounter (HOSPITAL_COMMUNITY): Payer: Self-pay | Admitting: Licensed Clinical Social Worker

## 2019-03-14 ENCOUNTER — Ambulatory Visit (INDEPENDENT_AMBULATORY_CARE_PROVIDER_SITE_OTHER): Payer: Medicaid Other | Admitting: Licensed Clinical Social Worker

## 2019-03-14 ENCOUNTER — Other Ambulatory Visit: Payer: Self-pay

## 2019-03-14 DIAGNOSIS — F331 Major depressive disorder, recurrent, moderate: Secondary | ICD-10-CM | POA: Diagnosis not present

## 2019-03-14 NOTE — Progress Notes (Signed)
Virtual Visit via Telephone Note  I connected with [name] on 03/14/19 at 10:00 AM EST by phone enabled telehealth application and verified that I am speaking with the correct person using two identifiers. Patient does not have a computer or the Internet.   I discussed the limitations of evaluation and management by telemedicine and the availability of in person appointments. The patient expressed understanding and agreed to proceed.  History of Present Illness: Pt was referred for OP therapy by her neurologist Dr. [place] Lions for depression.   Observations/Objective: Verbalize hopeful and positive statements regarding the future.Pt presented  anxious for her individual therapy session. Pt described her psychiatric symptoms and current life events. Patient described her continued major stressor: "my daughter."  Again, discussed coping skills: #1 skill diversion and boundaries. Role played boundaries. Reminded patient the importance of self care. Patient followed through with getting dr approval to receive COVID vaccine. Researched vaccination sites in [employer] & gave patient phone # to make an appointment. Patient got the results from her lab work and dr sent a referral for speech therapy. Made new appointments.  PLAN: childhood memories  Assessment and plan: Counselor will continue to meet with patient to address treatment plan goals. Patient will continue to follow recommendations of providers and implement skills learned in session.  Follow Up Instructions: I discussed her desire to come back into the office for therapy and she does not feel comfortable coming into the office yet. She wants to continue sessions by phone.   I discussed the assessment and treatment plan with the patient. The patient was provided an opportunity to ask questions and all were answered. The patient agreed with the plan and demonstrated an understanding of the instructions.   The patient was advised to call  back or seek an in-person evaluation if the symptoms worsen or if the condition fails to improve as anticipated.  I provided 45 minutes of non-face-to-face time during this encounter.   Adelaide Pfefferkorn S, LCAS

## 2019-03-21 MED FILL — LEVETIRACETAM ER 500 MG TAB: 500 | 30 days supply | Qty: 90 | Fill #3

## 2019-03-21 MED FILL — HYDROCHLOROTHIAZIDE 25 MG T: 25 | 90 days supply | Qty: 90 | Fill #1

## 2019-03-21 MED FILL — LISINOPRIL 40 MG TABLET: 40 | 90 days supply | Qty: 90 | Fill #1

## 2019-03-28 ENCOUNTER — Ambulatory Visit (INDEPENDENT_AMBULATORY_CARE_PROVIDER_SITE_OTHER): Payer: Medicaid Other | Admitting: Licensed Clinical Social Worker

## 2019-03-28 ENCOUNTER — Other Ambulatory Visit: Payer: Self-pay

## 2019-03-28 ENCOUNTER — Encounter (HOSPITAL_COMMUNITY): Payer: Self-pay | Admitting: Licensed Clinical Social Worker

## 2019-03-28 DIAGNOSIS — F331 Major depressive disorder, recurrent, moderate: Secondary | ICD-10-CM | POA: Diagnosis not present

## 2019-03-28 NOTE — Progress Notes (Signed)
Virtual Visit via Telephone Note  I connected with [name] on 03/28/19 at 10:00 AM EST by phone enabled telehealth application and verified that I am speaking with the correct person using two identifiers. Patient does not have a computer or the Internet.   I discussed the limitations of evaluation and management by telemedicine and the availability of in person appointments. The patient expressed understanding and agreed to proceed.  History of Present Illness: Pt was referred for OP therapy by her neurologist Dr. [place] Lions for depression.   Observations/Objective: Verbalize hopeful and positive statements regarding the future. Pt presented  anxious for her individual therapy session. Pt described her psychiatric symptoms and current life events. Patient described her continued major stressor: "my daughter."  Used mindfulness-based CBT to assist patient in building positive relationships using boundaries.  Followup: referral for speech therapy.   PLAN: childhood memories  Assessment and plan: Counselor will continue to meet with patient to address treatment plan goals. Patient will continue to follow recommendations of providers and implement skills learned in session.  Follow Up Instructions: I discussed her desire to come back into the office for therapy and she does not feel comfortable coming into the office yet. She wants to continue sessions by phone.   I discussed the assessment and treatment plan with the patient. The patient was provided an opportunity to ask questions and all were answered. The patient agreed with the plan and demonstrated an understanding of the instructions.   The patient was advised to call back or seek an in-person evaluation if the symptoms worsen or if the condition fails to improve as anticipated.  I provided 45 minutes of non-face-to-face time during this encounter.   [name], LCAS

## 2019-04-05 ENCOUNTER — Other Ambulatory Visit: Payer: Self-pay

## 2019-04-05 ENCOUNTER — Ambulatory Visit: Payer: Medicaid Other | Attending: Internal Medicine | Admitting: Speech Pathology

## 2019-04-05 DIAGNOSIS — R482 Apraxia: Secondary | ICD-10-CM | POA: Diagnosis not present

## 2019-04-05 NOTE — Patient Instructions (Signed)
  Developmental Apraxia of Speech   Your mouth muscles aren't keeping up with what your brain is telling your mouth to do. Speech is very fast.  Great job speaking slowly and using syllables for harder words!! That is exactly what you should do  Practice saying your medications slowly with syllables  Hy-Dro-Diu-Ril  Zes-Tir-Il  Clar-A-Tin  Glu-Co-Phage  Rem-Er-On  Kep-Ra  When you encounter a word that you have trouble saying, write it down and practice it slow and with syllables  When you get upset, tired or stress your speech will get worse  Practice these slowly 3x with syllables  Speech exercises - do 5x each, x2-3/day SLOW BIG  SAY THE FOLLOWING- make every sound! Red leather, yellow leather     Purple baby carriage    [facility] Proper copper coffee pot Three free throws [place] [employer], Blue Bulb Flash Message Double Bubble Gum Cinnamon aluminum linoleum Lovely lemon linament Unique [place] A Three Toed Tree Toad Knapsack Strap Snap Rubber [address] Bumpers [place]  [place]ailors Sailed the [place] Which Wrist Watches are [place] Wrist Watches

## 2019-04-10 ENCOUNTER — Encounter: Payer: Self-pay | Admitting: Speech Pathology

## 2019-04-10 NOTE — Therapy (Signed)
[facility] [address] [place], [place], [address] Phone: [phone]   Fax:  [phone]  Speech Language Pathology Evaluation  Patient Details  Name: [name] MRN: [id number] Date of Birth: [birth year] Referring Provider (SLP): Dr. Jonah Blue   Encounter Date: 04/05/2019  End of Session - 04/10/19 1208    Visit Number  1    Number of Visits  5    Date for SLP Re-Evaluation  05/22/19    Authorization Type  medicaid    SLP Start Time  1103    SLP Stop Time   1144    SLP Time Calculation (min)  41 min    Activity Tolerance  Patient tolerated treatment well       Past Medical History:  Diagnosis Date  . Anxiety   . Carpal tunnel syndrome   . Diabetes mellitus without complication (HCC)   . Diabetes mellitus, type II (HCC)   . DYSTONIA 01/04/2007   Qualifier: Diagnosis of  By: Humberto Seals NP, Darl Pikes    . Facial droop 10/09/2013  . Hemorrhoid   . Hypertension   . Major depressive disorder, recurrent episode (HCC) 03/18/2006   Qualifier: Diagnosis of  By: Bradly Bienenstock    . Obesity, unspecified 10/17/2007   Qualifier: Diagnosis of  By: Gerilyn Pilgrim PHD, Jeannie    . Prediabetes 10/09/2013  . Rectal bleeding 07/03/2015  . Seizures (HCC)   . Seizures (HCC) 08/24/2013    Past Surgical History:  Procedure Laterality Date  . ABDOMINAL HYSTERECTOMY    . COLONOSCOPY N/A 07/15/2015   Dr. Darrick Penna: small-mouth diverticula, small non-bleeding internal hemorrhoids, redundant colon   . HERNIA REPAIR    . MYOMECTOMY      There were no vitals filed for this visit.  Subjective Assessment - 04/10/19 1203    Subjective  "My sister and daughter help me"    Currently in Pain?  No/denies         SLP Evaluation [facility] - 04/10/19 1203      SLP Visit Information   SLP Received On  04/05/19    Referring Provider (SLP)  Dr. Jonah Blue    Onset Date  childhood    Medical Diagnosis  Difficulty articulating words      Subjective   Patient/Family Stated Goal  "To pronounce my words better"      General Information   Mobility Status  walks independently      Balance Screen   Has the patient fallen in the past 6 months  Yes    How many times?  2    Has the patient had a decrease in activity level because of a fear of falling?   No    Is the patient reluctant to leave their home because of a fear of falling?   No      Prior Functional Status   Cognitive/Linguistic Baseline  Within functional limits    Type of Home  Apartment     Lives With  Alone    Vocation  On disability      Cognition   Overall Cognitive Status  Within Functional Limits for tasks assessed   vague h/o of learning disablities in school     Auditory Comprehension   Overall Auditory Comprehension  Appears within functional limits for tasks assessed      Reading Comprehension   Reading Status  Not tested      Verbal Expression   Overall Verbal Expression  Appears within functional limits  for tasks assessed      Written Expression   Written Expression  Not tested      Oral Motor/Sensory Function   Overall Oral Motor/Sensory Function  Appears within functional limits for tasks assessed      Motor Speech   Overall Motor Speech  Impaired    Respiration  Within functional limits    Phonation  Normal    Resonance  Within functional limits    Articulation  Impaired    Level of Impairment  Conversation    Intelligibility  Intelligibility reduced    Word  75-100% accurate    Phrase  75-100% accurate    Sentence  75-100% accurate    Conversation  75-100% accurate    Motor Planning  Impaired    Level of Impairment  Science writer Errors  Groping for words;Inconsistent;Aware                      SLP Education - 04/10/19 1207    Education Details  HEP for verbal apraxia; compensations for verbal apraxia    Person(s) Educated  Patient    Methods  Explanation;Demonstration;Verbal cues;Handout     Comprehension  Returned demonstration;Verbal cues required;Need further instruction         SLP Long Term Goals - 04/10/19 1220      SLP LONG TERM GOAL #1   Title  Pt will utilize compensations for verbal apraxia on multisyllabic words at sentence level with rare min A    Baseline  no compensations    Time  4    Period  Weeks    Status  New      SLP LONG TERM GOAL #2   Title  Pt will utilize compensations for verbal apraxia as needed during 20 minute mildly complex conversation with rare min A    Baseline  no compensations    Time  4    Period  Weeks    Status  New       Plan - 04/10/19 1208    Clinical Impression Statement  Ms. 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 Pt stated that she cannot pronounce certain words and that "outsiders pick up on it and some people are not nice. She relates that her sister and daughter help her pronounce words.  [name] reports she has had this difficulty since childhood and has never received speech therapy. She reports receiving some special education services in grade school, vague h/o learning disability. [name] denies word finding difficulties.  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 Evidence of oral apraxia as well on non verbal voluntary oral movements. She demonstrated irregular diadochokinetcs were irregular with articulatory breakdown and groping. Repetition of words also demonstrated halting/groping with increased word length. I recommend brief course of ST to train [name] in compensatory strategies for verbal apraxia to improve communication in community settings.    Speech Therapy Frequency  1x /week    Duration  4 weeks    Treatment/Interventions  SLP instruction and feedback;Cueing hierarchy;Environmental controls;Language facilitation;Compensatory strategies;Patient/family education;Multimodal communcation approach;Internal/external  aids    Potential to Achieve Goals  Good    Potential Considerations  Previous level of function       Patient will benefit from skilled therapeutic intervention in order to improve the following deficits and impairments:   Verbal apraxia    Problem List Patient Active Problem List  Diagnosis Date Noted  . Difficulty articulating words 03/07/2019  . Adjustment disorder with anxious mood 08/18/2018  . Obesity (BMI 30-39.9) 09/17/2016  . Internal hemorrhoid 09/16/2016  . Adjustment disorder with mixed anxiety and depressed mood 12/04/2015  . Primary insomnia 12/04/2015  . Prediabetes 10/09/2013  . Facial droop 10/09/2013  . Seizures (HCC) 08/24/2013  . Obesity, unspecified 10/17/2007  . 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 PES PLANUS, RIGHT 07/20/2007  . DYSTONIA 01/04/2007  . Major depressive disorder, recurrent episode, moderate (HCC) 03/18/2006  . Essential hypertension 03/18/2006  . PSORIASIS 03/18/2006    Gauri Galvao, Radene Journey MS, CCC-SLP 04/10/2019, 12:22 PM  [facility] [address] [place], [place], [address] Phone: [phone]   Fax:  [phone]  Name: [name] MRN: [id number] Date of Birth: [birth year]

## 2019-04-12 ENCOUNTER — Ambulatory Visit (INDEPENDENT_AMBULATORY_CARE_PROVIDER_SITE_OTHER): Payer: Medicaid Other | Admitting: Licensed Clinical Social Worker

## 2019-04-12 ENCOUNTER — Encounter (HOSPITAL_COMMUNITY): Payer: Self-pay | Admitting: Licensed Clinical Social Worker

## 2019-04-12 ENCOUNTER — Other Ambulatory Visit: Payer: Self-pay

## 2019-04-12 DIAGNOSIS — F331 Major depressive disorder, recurrent, moderate: Secondary | ICD-10-CM | POA: Diagnosis not present

## 2019-04-12 NOTE — Progress Notes (Signed)
Virtual Visit via Telephone Note  I connected with [name] on 04/12/19 at 10:00 AM EDT by phone enabled telehealth application and verified that I am speaking with the correct person using two identifiers. Patient does not have a computer or the Internet.   I discussed the limitations of evaluation and management by telemedicine and the availability of in person appointments. The patient expressed understanding and agreed to proceed.  History of Present Illness: Pt was referred for OP therapy by her neurologist Dr. [place] Lions for depression.   Observations/Objective: Verbalize hopeful and positive statements regarding the future. Pt presented  anxious for her individual therapy session. Pt described her psychiatric symptoms and current life events. Patient described her continued major stressor: "my daughter." Educated patient on  Mindfulness based stress response to assist her lowering her stress response to the drama her daughter creates. Patient had her assessment appointment with speech therapist. Suggested patient call for follow up appointments after pre-authorization completed. Gave patient # to register to get vaccination at mall.  Followup: referral for speech therapy.   PLAN: childhood memories  Assessment and plan: Counselor will continue to meet with patient to address treatment plan goals. Patient will continue to follow recommendations of providers and implement skills learned in session.  Follow Up Instructions: I discussed her desire to come back into the office for therapy and she does not feel comfortable coming into the office yet. She wants to continue sessions by phone.   I discussed the assessment and treatment plan with the patient. The patient was provided an opportunity to ask questions and all were answered. The patient agreed with the plan and demonstrated an understanding of the instructions.   The patient was advised to call back or seek an in-person evaluation  if the symptoms worsen or if the condition fails to improve as anticipated.  I provided 60 minutes of non-face-to-face time during this encounter.   [name], LCAS

## 2019-04-26 ENCOUNTER — Ambulatory Visit (INDEPENDENT_AMBULATORY_CARE_PROVIDER_SITE_OTHER): Payer: Medicaid Other | Admitting: Licensed Clinical Social Worker

## 2019-04-26 ENCOUNTER — Encounter (HOSPITAL_COMMUNITY): Payer: Self-pay | Admitting: Licensed Clinical Social Worker

## 2019-04-26 ENCOUNTER — Other Ambulatory Visit: Payer: Self-pay

## 2019-04-26 DIAGNOSIS — F331 Major depressive disorder, recurrent, moderate: Secondary | ICD-10-CM

## 2019-04-26 NOTE — Progress Notes (Signed)
Virtual Visit via Telephone Note  I connected with [name] on 04/26/19 at 10:00 AM EDT by phone enabled telehealth application and verified that I am speaking with the correct person using two identifiers. Patient does not have a computer or the Internet.   I discussed the limitations of evaluation and management by telemedicine and the availability of in person appointments. The patient expressed understanding and agreed to proceed.  History of Present Illness: Pt was referred for OP therapy by her neurologist Dr. Homer Lions for depression.   Observations/Objective: Verbalize hopeful and positive statements regarding the future. Pt presented  depressed for her individual therapy session. Pt described her psychiatric symptoms and current life events. "i'm scared to get the vaccination." Explored options with patient.  Used CBT to assist patient with managing her fear. Explored with patient her thoughts, beliefs, feelings, and behaviors, counteracting her thought patterns. Patient was encouraged to counteract thought patterns including fear.   Followup: referral for speech therapy.   Assessment and plan: Counselor will continue to meet with patient to address treatment plan goals. Patient will continue to follow recommendations of providers and implement skills learned in session.  Follow Up Instructions: I discussed her desire to come back into the office for therapy and she does not feel comfortable coming into the office yet. She wants to continue sessions by phone.   I discussed the assessment and treatment plan with the patient. The patient was provided an opportunity to ask questions and all were answered. The patient agreed with the plan and demonstrated an understanding of the instructions.   The patient was advised to call back or seek an in-person evaluation if the symptoms worsen or if the condition fails to improve as anticipated.  I provided 45 minutes of non-face-to-face time  during this encounter.   [name], LCAS

## 2019-05-02 ENCOUNTER — Encounter: Payer: Self-pay | Admitting: Internal Medicine

## 2019-05-02 ENCOUNTER — Other Ambulatory Visit: Payer: Self-pay

## 2019-05-02 ENCOUNTER — Ambulatory Visit: Payer: Medicaid Other | Attending: Internal Medicine | Admitting: Internal Medicine

## 2019-05-02 VITALS — BP 170/100 | HR 72 | Temp 96.8°F | Resp 16 | Wt 212.0 lb

## 2019-05-02 DIAGNOSIS — Z638 Other specified problems related to primary support group: Secondary | ICD-10-CM | POA: Diagnosis not present

## 2019-05-02 DIAGNOSIS — Z7984 Long term (current) use of oral hypoglycemic drugs: Secondary | ICD-10-CM | POA: Diagnosis not present

## 2019-05-02 DIAGNOSIS — Z6830 Body mass index (BMI) 30.0-30.9, adult: Secondary | ICD-10-CM | POA: Insufficient documentation

## 2019-05-02 DIAGNOSIS — Z7982 Long term (current) use of aspirin: Secondary | ICD-10-CM | POA: Diagnosis not present

## 2019-05-02 DIAGNOSIS — Z8249 Family history of ischemic heart disease and other diseases of the circulatory system: Secondary | ICD-10-CM | POA: Insufficient documentation

## 2019-05-02 DIAGNOSIS — I10 Essential (primary) hypertension: Secondary | ICD-10-CM

## 2019-05-02 DIAGNOSIS — G40909 Epilepsy, unspecified, not intractable, without status epilepticus: Secondary | ICD-10-CM | POA: Diagnosis not present

## 2019-05-02 DIAGNOSIS — R7303 Prediabetes: Secondary | ICD-10-CM | POA: Diagnosis not present

## 2019-05-02 DIAGNOSIS — Z79899 Other long term (current) drug therapy: Secondary | ICD-10-CM | POA: Insufficient documentation

## 2019-05-02 DIAGNOSIS — E669 Obesity, unspecified: Secondary | ICD-10-CM

## 2019-05-02 LAB — GLUCOSE, POCT (MANUAL RESULT ENTRY): POC Glucose: 100 mg/dl — AB (ref 70–99)

## 2019-05-02 NOTE — Progress Notes (Signed)
Patient ID: [name], female    DOB: [birth year]  MRN: [id number]  CC: Hypertension   Subjective: [name] is a 64 y.o. female who presents for chronic ds management Her concerns today include:  Pt with hx of depression, HTN, Sz dsand pseudoSZ, pre-DM, trichotillomania, internal hemorrhoids, seasonal allergies.  C/o severe pain in LT arm last wk 4/5-06/2019.  Resolved after taking bath in Epsom salt.   HYPERTENSION Currently taking: see medication list Med Adherence: [x]  Yes but did not take Lisinopril and HCTZ as yet for the morning   []  No Medication side effects: []  Yes    [x]  No Adherence with salt restriction: [x]  Yes    []  No Home Monitoring?: []  Yes    [x]  No but does have device Monitoring Frequency: []  Yes    []  No Home BP results range: []  Yes    []  No SOB? []  Yes    [x]  No Chest Pain?: []  Yes    [x]  No Leg swelling?: []  Yes    [x]  No Headaches?: []  Yes    [x]  No Dizziness? []  Yes    [x]  No Comments: Reports she has been at her daughter's house helping to take care of her grand-kids.  "But I have been neglecting myself."  Feels bad when she does not see her grandchildren. Reports her daughter is mean to the children so she feels compelled "to go to protect the children."  She feels stressed out and dealing with her daughter and caring for her grandkids.  She continues to see her therapist about these issues.   Obesity/PreDM:  wgh down 14 lbs in past yr.  Walking 6 days a wk for 30-40 mins.  Feels she is doing better with her eating habits.  Hypercalcemia:  She will stop at lab today to get labs done as ordered on last visit  Sz disorder:  Had a litte dizzy spell yesterday but has not had any seizures. Compliant with Keppra.  Has appt with neurologist in June  Found speech therapy beneficial.  Patient Active Problem List   Diagnosis Date Noted  . Difficulty articulating words 03/07/2019  . Adjustment disorder with anxious mood 08/18/2018  . Obesity  (BMI 30-39.9) 09/17/2016  . Internal hemorrhoid 09/16/2016  . Adjustment disorder with mixed anxiety and depressed mood 12/04/2015  . Primary insomnia 12/04/2015  . Prediabetes 10/09/2013  . Facial droop 10/09/2013  . Seizures (HCC) 08/24/2013  . Obesity, unspecified 10/17/2007  . 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 PES PLANUS, RIGHT 07/20/2007  . DYSTONIA 01/04/2007  . Major depressive disorder, recurrent episode, moderate (HCC) 03/18/2006  . Essential hypertension 03/18/2006  . PSORIASIS 03/18/2006     Current Outpatient Medications on File Prior to Visit  Medication Sig Dispense Refill  . amLODipine (NORVASC) 10 MG tablet Take 1 tablet (10 mg total) by mouth daily. 90 tablet 2  . aspirin EC 81 MG tablet Take 1 tablet (81 mg total) by mouth daily. 100 tablet 1  . Blood Pressure Monitor DEVI Use as directed to check home blood pressure 2-3 times a week 1 Device 0  . Cholecalciferol (VITAMIN D3) 3000 units TABS Take by mouth.    . hydrochlorothiazide (HYDRODIURIL) 25 MG tablet Take 1 tablet (25 mg total) by mouth daily. 90 tablet 3  . levETIRAcetam (KEPPRA XR) 500 MG 24 hr tablet Take 1 tablet three times a day 270 tablet 3  . lisinopril (ZESTRIL) 40 MG tablet Take 1 tablet (40 mg total) by mouth daily.  90 tablet 2  . loratadine (CLARITIN) 10 MG tablet Take 1 tablet (10 mg total) by mouth daily. 30 tablet 2  . metFORMIN (GLUCOPHAGE) 500 MG tablet Take 1 tablet (500 mg total) by mouth daily with breakfast. 30 tablet 5  . mirtazapine (REMERON) 30 MG tablet Take 1 tablet (30 mg total) by mouth at bedtime. 90 tablet 1   No current facility-administered medications on file prior to visit.    Allergies  Allergen Reactions  . Codeine Anaphylaxis  . Shellfish Allergy Swelling and Rash    Throat Sweling  . Apple Cider Vinegar     syncope  . Cinnamon Hives  . Opium     Interferes with Keppra  . Banana Rash    Cannot eat any while taking KEPPRA  . Catfish [Fish Allergy] Rash  . Orange  Fruit [Citrus] Rash    Cannot have while taking KEPPRA  . Tomato Itching and Rash    Social History   Socioeconomic History  . Marital status: Divorced    Spouse name: Not on file  . Number of children: 1  . Years of education: Not on file  . Highest education level: Not on file  Occupational History  . Not on file  Tobacco Use  . Smoking status: Never Smoker  . Smokeless tobacco: Never Used  Substance and Sexual Activity  . Alcohol use: No    Alcohol/week: 0.0 standard drinks  . Drug use: No  . Sexual activity: Never  Other Topics Concern  . Not on file  Social History Narrative  . Not on file   Social Determinants of Health   Financial Resource Strain:   . Difficulty of Paying Living Expenses:   Food Insecurity:   . Worried About Programme researcher, broadcasting/film/video in the Last Year:   . Barista in the Last Year:   Transportation Needs:   . Freight forwarder (Medical):   [name] Lack of Transportation (Non-Medical):   Physical Activity:   . Days of Exercise per Week:   . Minutes of Exercise per Session:   Stress:   . Feeling of Stress :   Social Connections:   . Frequency of Communication with Friends and Family:   . Frequency of Social Gatherings with Friends and Family:   . Attends Religious Services:   . Active Member of Clubs or Organizations:   . Attends Banker Meetings:   [name] Marital Status:   Intimate Partner Violence:   . Fear of Current or Ex-Partner:   . Emotionally Abused:   [name] Physically Abused:   . Sexually Abused:     Family History  Problem Relation Age of Onset  . Asthma Mother   . Hypertension Father   . Heart disease Father   . Stroke Father   . Cancer Sister        uterus cancer   . Diabetes Maternal Uncle   . Breast cancer Cousin   . Colon cancer Neg Hx     Past Surgical History:  Procedure Laterality Date  . ABDOMINAL HYSTERECTOMY    . COLONOSCOPY N/A 07/15/2015   Dr. Darrick Penna: small-mouth diverticula, small non-bleeding  internal hemorrhoids, redundant colon   . HERNIA REPAIR    . MYOMECTOMY      ROS: Review of Systems Negative except as stated above  PHYSICAL EXAM: BP (!) 170/100   Pulse 72   Temp (!) 96.8 F (36 C)   Resp 16   Wt 212 lb (  96.2 kg)   SpO2 95%   BMI 38.78 kg/m   Wt Readings from Last 3 Encounters:  05/02/19 212 lb (96.2 kg)  05/03/18 226 lb (102.5 kg)  02/17/18 216 lb (98 kg)   Physical Exam General appearance - alert, well appearing, older African-American female and in no distress Mental status - normal mood, behavior, speech, dress, motor activity, and thought processes Eyes - pupils equal and reactive, extraocular eye movements intact Neck - supple, no significant adenopathy Chest - clear to auscultation, no wheezes, rales or rhonchi, symmetric air entry Heart - normal rate, regular rhythm, normal S1, S2, no murmurs, rubs, clicks or gallops Extremities - peripheral pulses normal, no pedal edema, no clubbing or cyanosis Diabetic Foot Exam - Simple   Simple Foot Form Visual Inspection No deformities, no ulcerations, no other skin breakdown bilaterally: Yes Sensation Testing Intact to touch and monofilament testing bilaterally: Yes Pulse Check Posterior Tibialis and Dorsalis pulse intact bilaterally: Yes Comments      CMP Latest Ref Rng & Units 02/15/2019 10/22/2017 12/15/2016  Glucose 65 - 99 mg/dL 77 85 95  BUN 8 - 27 mg/dL 13 11 12   Creatinine 0.57 - 1.00 mg/dL 0.91 0.83 0.87  Sodium 134 - 144 mmol/L 135 143 141  Potassium 3.5 - 5.2 mmol/L 3.9 4.0 3.8  Chloride 96 - 106 mmol/L 96 102 100  CO2 20 - 29 mmol/L 23 25 25   Calcium 8.7 - 10.3 mg/dL 10.7(H) 9.7 10.3(H)  Total Protein 6.0 - 8.5 g/dL 7.9 7.1 7.5  Total Bilirubin 0.0 - 1.2 mg/dL 0.5 0.3 <0.2  Alkaline Phos 39 - 117 IU/L 145(H) 116 95  AST 0 - 40 IU/L 29 16 17   ALT 0 - 32 IU/L 18 12 14    Lipid Panel     Component Value Date/Time   CHOL 145 02/15/2019 0849   TRIG 102 02/15/2019 0849   HDL 58  02/15/2019 0849   CHOLHDL 2.5 02/15/2019 0849   CHOLHDL 2.3 02/18/2016 0948   VLDL 25 02/18/2016 0948   LDLCALC 68 02/15/2019 0849    CBC    Component Value Date/Time   WBC 8.0 02/15/2019 0849   WBC 6.9 02/18/2016 0948   RBC 5.68 (H) 02/15/2019 0849   RBC 4.74 02/18/2016 0948   HGB 14.6 02/15/2019 0849   HCT 44.9 02/15/2019 0849   PLT 347 02/15/2019 0849   MCV 79 02/15/2019 0849   MCH 25.7 (L) 02/15/2019 0849   MCH 26.8 (L) 02/18/2016 0948   MCHC 32.5 02/15/2019 0849   MCHC 32.8 02/18/2016 0948   RDW 16.0 (H) 02/15/2019 0849   LYMPHSABS 1,863 02/18/2016 0948   MONOABS 483 02/18/2016 0948   EOSABS 138 02/18/2016 0948   BASOSABS 0 02/18/2016 0948    ASSESSMENT AND PLAN: 1. Essential hypertension Not at goal.  Patient has not taken her medicines as yet today.  She will take the lisinopril, Norvasc and hydrochlorothiazide as soon as she returns home.  Advised to check blood pressure at least twice a week with goal being 130/80 or lower.  Follow-up with clinical pharmacist in 1 month for blood pressure check.  Advised to bring home readings with her  2. Prediabetes 3. Obesity (BMI 30-39.9) -Commended her on weight loss.  Encouraged her to continue healthy eating habits and regular exercise. - POCT glucose (manual entry)   4. Hypercalcemia - Protein electrophoresis, serum - VITAMIN D 25 Hydroxy (Vit-D Deficiency, Fractures) - PTH-Related Peptide - PTH, Intact and Calcium - TSH  5.  Seizure disorder (HCC) Stable.  Continue Keppra.  She will keep her follow-up appointment with neurology in June.  6. Stress due to family tension Encourage patient to talk with her daughter about her feelings.  If she is feeling to stressed out, she may have to cut back on the frequency at which she keeps her grandchildren.     Patient was given the opportunity to ask questions.  Patient verbalized understanding of the plan and was able to repeat key elements of the plan.   Orders Placed  This Encounter  Procedures  . POCT glucose (manual entry)     Requested Prescriptions    No prescriptions requested or ordered in this encounter    Return in about 4 months (around 09/01/2019).  Jonah Blue, MD, FACP

## 2019-05-02 NOTE — Patient Instructions (Signed)
Please give patient an appointment with the clinical pharmacist in 1 month for repeat blood pressure check.  Please check your blood pressure at least twice a week.  The goal is 130/80 or lower.  Please bring your blood pressure log readings with you when you come to see the clinical pharmacist in 1 month.

## 2019-05-03 ENCOUNTER — Other Ambulatory Visit: Payer: Self-pay | Admitting: Internal Medicine

## 2019-05-03 ENCOUNTER — Telehealth: Payer: Self-pay

## 2019-05-03 MED ORDER — VITAMIN D (ERGOCALCIFEROL) 1.25 MG (50000 UNIT) PO CAPS: 50000.0000 [IU] | ORAL_CAPSULE | ORAL | 1 refills | Status: DC

## 2019-05-03 MED FILL — VIT D2 1.25 MG (50,000 UNIT: 1.25 MG | 84 days supply | Qty: 12 | Fill #0

## 2019-05-03 NOTE — Telephone Encounter (Signed)
Contacted pt to go over lab results pt didn't answer and was unable to lvm due to vm not being set up 

## 2019-05-03 NOTE — Progress Notes (Signed)
Let patient know that her calcium level is now normal.  Her vitamin D level is very low.  Thyroid level is normal.  I recommend that she discontinue any vitamin D supplement that she is currently taking.  I will send a prescription for high-dose vitamin D to her pharmacy for her to pick up.  She should take the high-dose vitamin D once a week.

## 2019-05-10 ENCOUNTER — Encounter (HOSPITAL_COMMUNITY): Payer: Self-pay | Admitting: Licensed Clinical Social Worker

## 2019-05-10 ENCOUNTER — Other Ambulatory Visit: Payer: Self-pay

## 2019-05-10 ENCOUNTER — Ambulatory Visit (INDEPENDENT_AMBULATORY_CARE_PROVIDER_SITE_OTHER): Payer: Medicaid Other | Admitting: Licensed Clinical Social Worker

## 2019-05-10 DIAGNOSIS — F331 Major depressive disorder, recurrent, moderate: Secondary | ICD-10-CM | POA: Diagnosis not present

## 2019-05-10 NOTE — Progress Notes (Signed)
Virtual Visit via Telephone Note  I connected with [name] on 05/10/19 at 10:00 AM EDT by phone enabled telehealth application and verified that I am speaking with the correct person using two identifiers. Patient does not have a computer or the Internet.   I discussed the limitations of evaluation and management by telemedicine and the availability of in person appointments. The patient expressed understanding and agreed to proceed.  History of Present Illness: Pt was referred for OP therapy by her neurologist Dr.  Lions for depression.   Observations/Objective: Verbalize hopeful and positive statements regarding the future. Pt presented  depressed for her individual therapy session. Pt described her psychiatric symptoms and current life events. Patient reports having 2 seizures yesterday. Clinician utilized MI OARS to affirm her concerns. Clinician suggested she journal activiites,  thoughts and feelings that led up to the seizures.. Clinician helped patient process the seizures and feelings surrounding them.    Followup:  speech therapy.   Assessment and plan: Counselor will continue to meet with patient to address treatment plan goals. Patient will continue to follow recommendations of providers and implement skills learned in session.  Follow Up Instructions: I discussed her desire to come back into the office for therapy and she does not feel comfortable coming into the office yet. She wants to continue sessions by phone.   I discussed the assessment and treatment plan with the patient. The patient was provided an opportunity to ask questions and all were answered. The patient agreed with the plan and demonstrated an understanding of the instructions.   The patient was advised to call back or seek an in-person evaluation if the symptoms worsen or if the condition fails to improve as anticipated.  I provided 45 minutes of non-face-to-face time during this  encounter.   Digby Groeneveld S, LCAS

## 2019-05-11 LAB — PROTEIN ELECTROPHORESIS, SERUM
A/G Ratio: 0.9 (ref 0.7–1.7)
Albumin ELP: 3.5 g/dL (ref 2.9–4.4)
Alpha 1: 0.2 g/dL (ref 0.0–0.4)
Alpha 2: 0.9 g/dL (ref 0.4–1.0)
Beta: 1.2 g/dL (ref 0.7–1.3)
Gamma Globulin: 1.6 g/dL (ref 0.4–1.8)
Globulin, Total: 3.9 g/dL (ref 2.2–3.9)
Total Protein: 7.4 g/dL (ref 6.0–8.5)

## 2019-05-11 LAB — VITAMIN D 25 HYDROXY (VIT D DEFICIENCY, FRACTURES): Vit D, 25-Hydroxy: 8 ng/mL — ABNORMAL LOW (ref 30.0–100.0)

## 2019-05-11 LAB — PTH-RELATED PEPTIDE: PTH-related peptide: <2 pmol/L

## 2019-05-11 LAB — PTH, INTACT AND CALCIUM
Calcium: 9.9 mg/dL (ref 8.7–10.3)
PTH: 62 pg/mL (ref 15–65)

## 2019-05-11 LAB — TSH: TSH: 2.51 u[IU]/mL (ref 0.450–4.500)

## 2019-05-23 ENCOUNTER — Ambulatory Visit (INDEPENDENT_AMBULATORY_CARE_PROVIDER_SITE_OTHER): Payer: Medicaid Other | Admitting: Licensed Clinical Social Worker

## 2019-05-23 ENCOUNTER — Other Ambulatory Visit: Payer: Self-pay

## 2019-05-23 ENCOUNTER — Encounter (HOSPITAL_COMMUNITY): Payer: Self-pay | Admitting: Licensed Clinical Social Worker

## 2019-05-23 DIAGNOSIS — F331 Major depressive disorder, recurrent, moderate: Secondary | ICD-10-CM

## 2019-05-23 NOTE — Progress Notes (Signed)
Virtual Visit via Telephone Note  I connected with [name] on 05/23/19 at 11:00 AM EDT by phone enabled telehealth application and verified that I am speaking with the correct person using two identifiers. Patient does not have a computer or the Internet.   I discussed the limitations of evaluation and management by telemedicine and the availability of in person appointments. The patient expressed understanding and agreed to proceed.  History of Present Illness: Pt was referred for OP therapy by her neurologist Dr. [place] Lions for depression.   Observations/Objective: Verbalize hopeful and positive statements regarding the future. Pt presented  depressed for her individual therapy session. Pt described her psychiatric symptoms and current life events. Patient reports my moods have been stable but I had 2 seizures seizures. on 4/20. Patient reviewed on her calendar/journal what contributed to the seizures. Discussed alternatives and skills that may help patient with on-going stressful situations that may lead to seizures.       Assessment and plan: Counselor will continue to meet with patient to address treatment plan goals. Patient will continue to follow recommendations of providers and implement skills learned in session.  Follow Up Instructions: I discussed her desire to come back into the office for therapy and she does not feel comfortable coming into the office yet. She wants to continue sessions by phone.   I discussed the assessment and treatment plan with the patient. The patient was provided an opportunity to ask questions and all were answered. The patient agreed with the plan and demonstrated an understanding of the instructions.   The patient was advised to call back or seek an in-person evaluation if the symptoms worsen or if the condition fails to improve as anticipated.  I provided 60 minutes of non-face-to-face time during this encounter.   Keyen Marban S,  LCAS

## 2019-05-24 ENCOUNTER — Other Ambulatory Visit: Payer: Self-pay

## 2019-05-24 ENCOUNTER — Ambulatory Visit: Payer: Medicaid Other | Attending: Internal Medicine | Admitting: Speech Pathology

## 2019-05-24 ENCOUNTER — Encounter: Payer: Self-pay | Admitting: Speech Pathology

## 2019-05-24 DIAGNOSIS — R482 Apraxia: Secondary | ICD-10-CM | POA: Insufficient documentation

## 2019-05-24 MED FILL — LEVETIRACETAM ER 500 MG TAB: 500 | 90 days supply | Qty: 270 | Fill #4

## 2019-05-24 MED FILL — MIRTAZAPINE 30 MG TABLET: 30 | 90 days supply | Qty: 90 | Fill #0

## 2019-05-24 MED FILL — METFORMIN HCL 500 MG TABS: 500 | 90 days supply | Qty: 90 | Fill #1

## 2019-05-24 MED FILL — LORATADINE 10 MG TABLET: 10 | 60 days supply | Qty: 60 | Fill #1

## 2019-05-24 MED FILL — VIT D2 1.25 MG (50,000 UNIT: 1.25 MG | 28 days supply | Qty: 4 | Fill #0

## 2019-05-24 MED FILL — AMLODIPINE BESYLATE 10 MG T: 10 | 90 days supply | Qty: 90 | Fill #1

## 2019-05-24 NOTE — Patient Instructions (Signed)
     Let your friends and family know that when you are having trouble getting words out, you are going to hold up your hand or finger and this means you need to slow down and talk syllable by syllable and they are to give you time to get your words out and not interrupt you.   When you are stressed, upset or excited, it is harder to get your words out - try to think slow down and talk in syllables  Keep a list of words you have trouble with - and we can practice those

## 2019-05-26 NOTE — Therapy (Signed)
[facility] [address] [place], [place], [address] Phone: [phone]   Fax:  [phone]  Speech Language Pathology Treatment  Patient Details  Name: [name] MRN: [id number] Date of Birth: [birth year] Referring Provider (SLP): Dr. Jonah Blue   Encounter Date: 05/24/2019  End of Session - 05/26/19 1039    Visit Number  2    Number of Visits  5    Date for SLP Re-Evaluation  05/22/19    Authorization Type  medicaid approved initial 3 visits    SLP Start Time  0847    SLP Stop Time   6761    SLP Time Calculation (min)  41 min       Past Medical History:  Diagnosis Date  . Anxiety   . Carpal tunnel syndrome   . Diabetes mellitus without complication (HCC)   . Diabetes mellitus, type II (HCC)   . DYSTONIA 01/04/2007   Qualifier: Diagnosis of  By: Humberto Seals NP, Darl Pikes    . Facial droop 10/09/2013  . Hemorrhoid   . Hypertension   . Major depressive disorder, recurrent episode (HCC) 03/18/2006   Qualifier: Diagnosis of  By: Bradly Bienenstock    . Obesity, unspecified 10/17/2007   Qualifier: Diagnosis of  By: Gerilyn Pilgrim PHD, Jeannie    . Prediabetes 10/09/2013  . Rectal bleeding 07/03/2015  . Seizures (HCC)   . Seizures (HCC) 08/24/2013    Past Surgical History:  Procedure Laterality Date  . ABDOMINAL HYSTERECTOMY    . COLONOSCOPY N/A 07/15/2015   Dr. Darrick Penna: small-mouth diverticula, small non-bleeding internal hemorrhoids, redundant colon   . HERNIA REPAIR    . MYOMECTOMY      There were no vitals filed for this visit.  Subjective Assessment - 05/26/19 1030    Subjective  "I saw my behavior health therapist yesterday"    Currently in Pain?  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    Person(s) Educated  Patient    Methods  Explanation;Demonstration;Verbal cues;Handout    Comprehension  Verbalized understanding;Returned demonstration;Verbal cues required;Need further instruction         SLP Long Term Goals - 05/26/19 1038      SLP LONG TERM GOAL #1   Title  Pt will utilize compensations for verbal apraxia on multisyllabic words at sentence level with rare min A    Baseline  no compensations    Time  4    Period  Weeks    Status  On-going      SLP LONG TERM GOAL #2   Title  Pt will utilize compensations for verbal apraxia as needed during 20 minute mildly complex conversation with rare min A    Baseline  no compensations    Time  4    Period  Weeks    Status  On-going       Plan - 05/26/19 1038    Clinical Impression Statement  Ms. 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 Pt stated that she cannot pronounce certain words  and that "outsiders pick up on it and some people are not nice. She relates that her sister and daughter help her pronounce words.  [name] reports she has had this difficulty since childhood and has never received speech therapy. She reports receiving some special education services in grade school, vague h/o learning disability. Ceclia denies word finding difficulties.  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 Evidence of oral apraxia as well on non verbal voluntary oral movements. She demonstrated irregular diadochokinetcs were irregular with articulatory breakdown and groping. Repetition of words also demonstrated halting/groping with increased word length. I recommend brief course of ST to train [name] in compensatory strategies for verbal apraxia to improve communication in community settings.    Speech Therapy Frequency  1x /week    Duration  --   4 weeks    Treatment/Interventions  SLP instruction and feedback;Cueing hierarchy;Environmental controls;Language facilitation;Compensatory strategies;Patient/family education;Multimodal communcation approach;Internal/external aids    Potential to Achieve Goals  Good    Potential Considerations  Previous level of function       Patient will benefit from skilled therapeutic intervention in order to improve the following deficits and impairments:   Verbal apraxia    Problem List Patient Active Problem List   Diagnosis Date Noted  . Difficulty articulating words 03/07/2019  . Adjustment disorder with anxious mood 08/18/2018  . Obesity (BMI 30-39.9) 09/17/2016  . Internal hemorrhoid 09/16/2016  . Adjustment disorder with mixed anxiety and depressed mood 12/04/2015  . Primary insomnia 12/04/2015  . Prediabetes 10/09/2013  . Facial droop 10/09/2013  . Seizures ([place]) 08/24/2013  . Obesity, unspecified 10/17/2007  . 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 PES PLANUS, RIGHT 07/20/2007  . DYSTONIA 01/04/2007  . Major depressive disorder, recurrent episode, moderate ([place]) 03/18/2006  . Essential hypertension 03/18/2006  . PSORIASIS 03/18/2006    Jyair Kiraly, Annye Rusk MS, CCC-SLP 05/26/2019, 10:40 AM  [facility] [address] [place], [place], [address] Phone: [phone]   Fax:  [phone]   Name: [name] MRN: [id number] Date of Birth: 1955/06/02

## 2019-05-31 ENCOUNTER — Ambulatory Visit: Payer: Medicaid Other | Admitting: Speech Pathology

## 2019-05-31 ENCOUNTER — Other Ambulatory Visit: Payer: Self-pay

## 2019-05-31 ENCOUNTER — Encounter: Payer: Self-pay | Admitting: Speech Pathology

## 2019-05-31 DIAGNOSIS — R482 Apraxia: Secondary | ICD-10-CM | POA: Diagnosis not present

## 2019-05-31 NOTE — Therapy (Signed)
[facility] [address] [place], [place], [address] Phone: [phone]   Fax:  [phone]  Speech Language Pathology Treatment  Patient Details  Name: [name] MRN: [id number] Date of Birth: [birth year] Referring Provider (SLP): Dr. Karle Plumber   Encounter Date: 05/31/2019  End of Session - 05/31/19 1209    Visit Number  3    Number of Visits  5    Date for SLP Re-Evaluation  06/22/19    SLP Start Time  0845    SLP Stop Time   0927    SLP Time Calculation (min)  42 min       Past Medical History:  Diagnosis Date  . Anxiety   . Carpal tunnel syndrome   . Diabetes mellitus without complication ([place])   . Diabetes mellitus, type II ([place])   . DYSTONIA 01/04/2007   Qualifier: Diagnosis of  By: Zebedee Iba NP, Manuela Schwartz    . Facial droop 10/09/2013  . Hemorrhoid   . Hypertension   . Major depressive disorder, recurrent episode ([place]) 03/18/2006   Qualifier: Diagnosis of  By: Beryle Lathe    . Obesity, unspecified 10/17/2007   Qualifier: Diagnosis of  By: Jenne Campus PHD, Jeannie    . Prediabetes 10/09/2013  . Rectal bleeding 07/03/2015  . Seizures ([place])   . Seizures ([place]) 08/24/2013    Past Surgical History:  Procedure Laterality Date  . ABDOMINAL HYSTERECTOMY    . COLONOSCOPY N/A 07/15/2015   Dr. Oneida Alar: small-mouth diverticula, small non-bleeding internal hemorrhoids, redundant colon   . HERNIA REPAIR    . MYOMECTOMY      There were no vitals filed for this visit.  Subjective Assessment - 05/31/19 0854    Subjective  "I told my family and they agreed" re: her reducing rate when she is having trouble talking    Currently in Pain?  No/denies            ADULT SLP TREATMENT - 05/31/19 0856      General Information   Behavior/Cognition  Alert;Cooperative;Pleasant mood      Treatment Provided   Treatment provided  Cognitive-Linquistic      Cognitive-Linquistic Treatment   Treatment focused on   Apraxia;Patient/family/caregiver education    [place] has educated her family and friends re: her need to slow her rate of speech - they are supporting her. Ongoing training in compensations for apraxia with 3-5 syllable words. Focused on slow rate and speaking in syllables with complex words. [name] benefitting from modeling, tapping out syllables or counting them on her fingers to produce complex words/sounds. She carried this over to sentence level with occasional min A. [name] had list of 3 words she wanted to work on, we wrote them with spaces between syllables and [name] was able to generate sentences successfully with this strategy      Assessment / Recommendations / [place] with current plan of care      Progression Toward Goals   Progression toward goals  Progressing toward goals       SLP Education - 05/31/19 1206    Education Details  compensations for apraxia, medic[employer] is currently accepting    Person(s) Educated  Patient    Methods  Explanation;Demonstration;Verbal cues;Handout    Comprehension  Verbalized understanding;Returned demonstration;Verbal cues required         SLP Long Term Goals - 05/31/19 1208      SLP LONG TERM GOAL #1  Title  Pt will utilize compensations for verbal apraxia on multisyllabic words at sentence level with rare min A    Baseline  no compensations    Time  3    Period  Weeks    Status  Achieved      SLP LONG TERM GOAL #2   Title  Pt will utilize compensations for verbal apraxia as needed during 20 minute mildly complex conversation with rare min A    Baseline  no compensations    Time  3    Period  Weeks    Status  On-going       Plan - 05/31/19 1207    Clinical Impression Statement  Ongoing training in compensatory strategies for verbal apraxia advanced from word level to sentence level. Family education provided to maximize carryover at home in conversations. Continue skilled ST to maixmize  intellgilbity and reduce pt's frustration with communication    Speech Therapy Frequency  1x /week    Duration  --   4 weeks   Treatment/Interventions  SLP instruction and feedback;Cueing hierarchy;Environmental controls;Language facilitation;Compensatory strategies;Patient/family education;Multimodal communcation approach;Internal/external aids    Potential to Achieve Goals  Good    Potential Considerations  Previous level of function       Patient will benefit from skilled therapeutic intervention in order to improve the following deficits and impairments:   Verbal apraxia    Problem List Patient Active Problem List   Diagnosis Date Noted  . Difficulty articulating words 03/07/2019  . Adjustment disorder with anxious mood 08/18/2018  . Obesity (BMI 30-39.9) 09/17/2016  . Internal hemorrhoid 09/16/2016  . Adjustment disorder with mixed anxiety and depressed mood 12/04/2015  . Primary insomnia 12/04/2015  . Prediabetes 10/09/2013  . Facial droop 10/09/2013  . Seizures (H[facility]) 08/24/2013  . Obesity, unspecified 10/17/2007  . 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 PES PLANUS, RIGHT 07/20/2007  . DYSTONIA 01/04/2007  . Major depressive disorder, recurrent episode, moderate (HCC) 03/18/2006  . Essential hypertension 03/18/2006  . PSORIASIS 03/18/2006    [name], [name], [name] 05/31/2019, 12:09 PM   [facility] [address] [place], [place], [address] Phone: [phone]   Fax:  [phone]   Name: [name] MRN: [id number] Date of Birth: 1955-02-07

## 2019-05-31 NOTE — Patient Instructions (Signed)
  Good job practicing talking syllable by syllable   When you are having trouble getting words out, it is helpful for your family to slow down their rate of speech as well and talk to you in a relaxed manner  With your granddaughter, until she sees an ST, it is best not to point out her speech errors or tell her to slow down. We don't want to make her worried or self conscious about her speech. The best thing to do is to have her caregivers slow their rate of speech and talk in a slow calm manner to model for her.   Right now, [name] is taking [employer] and [employer]. This starts June 1. You will be covered by your current program until then.   Use fingers or tapping to help you slow down and break harder words into syllables

## 2019-06-01 ENCOUNTER — Ambulatory Visit: Payer: Medicaid Other | Attending: Internal Medicine | Admitting: Pharmacist

## 2019-06-01 ENCOUNTER — Other Ambulatory Visit: Payer: Self-pay

## 2019-06-01 VITALS — BP 146/89 | HR 82

## 2019-06-01 DIAGNOSIS — E1140 Type 2 diabetes mellitus with diabetic neuropathy, unspecified: Secondary | ICD-10-CM | POA: Insufficient documentation

## 2019-06-01 DIAGNOSIS — I10 Essential (primary) hypertension: Secondary | ICD-10-CM

## 2019-06-01 DIAGNOSIS — Z79899 Other long term (current) drug therapy: Secondary | ICD-10-CM | POA: Insufficient documentation

## 2019-06-01 DIAGNOSIS — Z8249 Family history of ischemic heart disease and other diseases of the circulatory system: Secondary | ICD-10-CM | POA: Insufficient documentation

## 2019-06-01 NOTE — Progress Notes (Signed)
   S: PCP: Dr. Laural Benes     Patient arrives in good spirts. Presents to the clinic for hypertension management. Patient was referred by Dr. Laural Benes on 05/02/2019. BP 170/100 at that visit. Of note, she reported not taking medications that day. She was encouraged to take medications and return to see me.   Denies CP, SOB, blurred vision, or HA. Denies LE edema.   Patient reports adherence with medications. Took today.  Current BP Medications include:   - Amlodipine 10 mg daily - Lisinopril 40 mg daily  - HCTZ 25 mg daily   Dietary habits include:  - Garlic salt with sea salt; lists as her "guilty pleasure"  - Denies caffeine Exercise habits include: - Walks ~30 minutes/day Sun-Fri - Sometimes limited by diabetic neuropathy  Family / Social history:  - FH: Cardiac disease, HTN (father) - Tobacco: denies  - Alcohol: denies   O:  Today's Vitals   06/01/19 0949  BP: (!) 146/89  Pulse: 82   There is no height or weight on file to calculate BMI.  Does not check blood pressure at home.   Last 3 Office BP readings: BP Readings from Last 3 Encounters:  06/01/19 (!) 146/89  05/02/19 (!) 170/100  05/03/18 (!) 148/90   BMET    Component Value Date/Time   NA 135 02/15/2019 0849   K 3.9 02/15/2019 0849   CL 96 02/15/2019 0849   CO2 23 02/15/2019 0849   GLUCOSE 77 02/15/2019 0849   GLUCOSE 86 02/18/2016 0948   BUN 13 02/15/2019 0849   CREATININE 0.91 02/15/2019 0849   CREATININE 0.89 02/18/2016 0948   CALCIUM 9.9 05/02/2019 1011   GFRNONAA 68 02/15/2019 0849   GFRNONAA 73 08/17/2014 1013   GFRAA 79 02/15/2019 0849   GFRAA 84 08/17/2014 1013   Renal function: CrCl cannot be calculated (Patient's most recent lab result is older than the maximum 21 days allowed.).  A/P: Hypertension longstanding currently above goal on medications but imnproved. BP Goal <130/80 mmHg. Patient is adherent with current medications. Will make no changes given improvement and recheck in 1  month. -Continued current anti-hypertensives.  -Counseled on lifestyle modifications for blood pressure control including reduced dietary sodium, increased exercise, adequate sleep  Results reviewed and written information provided. Total time in face-to-face counseling 30 minutes.   F/U Clinic Visit 07/03/2019.    Butch Penny, PharmD, CPP Clinical Pharmacist [facility] [phone]

## 2019-06-02 ENCOUNTER — Encounter: Payer: Self-pay | Admitting: Pharmacist

## 2019-06-06 ENCOUNTER — Ambulatory Visit (INDEPENDENT_AMBULATORY_CARE_PROVIDER_SITE_OTHER): Payer: Medicaid Other | Admitting: Licensed Clinical Social Worker

## 2019-06-06 ENCOUNTER — Other Ambulatory Visit: Payer: Self-pay

## 2019-06-06 ENCOUNTER — Encounter (HOSPITAL_COMMUNITY): Payer: Self-pay | Admitting: Licensed Clinical Social Worker

## 2019-06-06 DIAGNOSIS — F331 Major depressive disorder, recurrent, moderate: Secondary | ICD-10-CM

## 2019-06-06 NOTE — Progress Notes (Signed)
Virtual Visit via Telephone Note  I connected with [name] on 06/06/19 at 10:00 AM EDT by phone enabled telehealth application and verified that I am speaking with the correct person using two identifiers. Patient does not have a computer or the Internet.   I discussed the limitations of evaluation and management by telemedicine and the availability of in person appointments. The patient expressed understanding and agreed to proceed.  History of Present Illness: Pt was referred for OP therapy by her neurologist Dr. Morrison Lions for depression.   Observations/Objective: Verbalize hopeful and positive statements regarding the future. Pt presented  anxious for her individual therapy session. Pt described her psychiatric symptoms and current life events. Patient reports her blood pressure has increased. Cln provided psychoeducation on high blood pressure prevention. Patient reports she has not had any seizures since 4/20. Cln provided psychoeducation on cautionary measures to prevent her seizures. Patient discussed her family relationships currently. Cln provided psychoeducation on dynamics of healthy vs unhealthy relationships associated with power and control.         Assessment and plan: Counselor will continue to meet with patient to address treatment plan goals. Patient will continue to follow recommendations of providers and implement skills learned in session.  Follow Up Instructions: I discussed her desire to come back into the office for therapy and she does not feel comfortable coming into the office yet. She wants to continue sessions by phone.   I discussed the assessment and treatment plan with the patient. The patient was provided an opportunity to ask questions and all were answered. The patient agreed with the plan and demonstrated an understanding of the instructions.   The patient was advised to call back or seek an in-person evaluation if the symptoms worsen or if the  condition fails to improve as anticipated.  I provided 60 minutes of non-face-to-face time during this encounter.   Jad Johansson S, LCAS

## 2019-06-07 ENCOUNTER — Other Ambulatory Visit: Payer: Self-pay

## 2019-06-07 ENCOUNTER — Encounter: Payer: Self-pay | Admitting: Speech Pathology

## 2019-06-07 ENCOUNTER — Ambulatory Visit: Payer: Medicaid Other | Admitting: Speech Pathology

## 2019-06-07 DIAGNOSIS — R482 Apraxia: Secondary | ICD-10-CM

## 2019-06-07 NOTE — Patient Instructions (Addendum)
   Keep practicing with the harder words - have family write them in syllables if you have trouble with a word  Keep a list of words that are hard for you and practice them   [place] job using a slow rate and syllables to say harder words  Keep up using your finger up to let others know you need more time to pronounce a word  [facility] re[facility]  [address] [phone]   [place] Pu e pain yo  [place]  Vak you m

## 2019-06-07 NOTE — Therapy (Signed)
[facility] [address] [place], [place], [address] Phone: [phone]   Fax:  [phone]  Speech Language Pathology Treatment & Discharge Summary  Patient Details  Name: [name] MRN: [id number] Date of Birth: Apr 30, 1955 Referring Provider (SLP): Dr. Karle Plumber   Encounter Date: 06/07/2019  End of Session - 06/07/19 1119    Visit Number  4    Number of Visits  5    Date for SLP Re-Evaluation  06/22/19    Authorization Type  medicaid approved initial 3 visits    SLP Start Time  0846    SLP Stop Time   0926    SLP Time Calculation (min)  40 min    Activity Tolerance  Patient tolerated treatment well       Past Medical History:  Diagnosis Date  . Anxiety   . Carpal tunnel syndrome   . Diabetes mellitus without complication ([place])   . Diabetes mellitus, type II ([place])   . DYSTONIA 01/04/2007   Qualifier: Diagnosis of  By: Zebedee Iba NP, Manuela Schwartz    . Facial droop 10/09/2013  . Hemorrhoid   . Hypertension   . Major depressive disorder, recurrent episode ([place]) 03/18/2006   Qualifier: Diagnosis of  By: Beryle Lathe    . Obesity, unspecified 10/17/2007   Qualifier: Diagnosis of  By: Jenne Campus PHD, Jeannie    . Prediabetes 10/09/2013  . Rectal bleeding 07/03/2015  . Seizures ([place])   . Seizures ([place]) 08/24/2013    Past Surgical History:  Procedure Laterality Date  . ABDOMINAL HYSTERECTOMY    . COLONOSCOPY N/A 07/15/2015   Dr. Oneida Alar: small-mouth diverticula, small non-bleeding internal hemorrhoids, redundant colon   . HERNIA REPAIR    . MYOMECTOMY      There were no vitals filed for this visit.  Subjective Assessment - 06/07/19 0857    Subjective  "I used my finger up to let my family know that I'm going to slow down and pronounce a word"    Currently in Pain?  No/denies            ADULT SLP TREATMENT - 06/07/19 0918      General Information   Behavior/Cognition  Alert;Cooperative;Pleasant mood      Treatment Provided   Treatment provided  Cognitive-Linquistic      Cognitive-Linquistic Treatment   Treatment focused on  Apraxia;Patient/family/caregiver education    Skilled Treatment  [name] independently used slow rate and syllables in initial conversation.  She demonstrated slow rate and syllables to produce sentences with 4-5 syllable words with rare min A.  [name] has educated her family in her strategies and told them not to interrupt her and let her go slow. [name] has used this with her family successfully several times since last session      Assessment / Recommendations / Plan   Plan  Discharge SLP treatment due to (comment)      Progression Toward Goals   Progression toward goals  Goals met, education completed, patient discharged from [place] Education - 06/07/19 0931    Education Details  continue to practice using syllables with longer words, keep a list of words you have trouble with and practice those    Person(s) Educated  Patient    Methods  Explanation;Demonstration;Handout    Comprehension  Verbalized understanding;Returned demonstration       [place]  Visits from Start of Care: 4  Current functional level related to goals /  functional outcomes: See goals below   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  Patient goals were met. Patient is being discharged due to meeting the stated rehab goals.  ?????          SLP Long Term Goals - 06/07/19 0934      SLP LONG TERM GOAL #1   Title  Pt will utilize compensations for verbal apraxia on multisyllabic words at sentence level with rare min A    Baseline  no compensations    Time  3    Period  Weeks    Status  Achieved      SLP LONG TERM GOAL #2   Title  Pt will utilize compensations for verbal apraxia as needed during 20 minute mildly complex conversation  with rare min A    Baseline  no compensations    Time  3    Period  Weeks    Status  Achieved       Plan - 06/07/19 0932    Clinical Impression Statement  [name] spontaneously and independently utilized slow rate and syllables in conversation to pronounce multisyllablic words. She has educated her family and is supervision with HEP. [name] will ask her family for help if she has a word she can't figure out the syllables. Goals met, education complete. D/C ST. Pt in agreement    Speech Therapy Frequency  1x /week    Duration  --   4 weeks   Treatment/Interventions  SLP instruction and feedback;Cueing hierarchy;Environmental controls;Language facilitation;Compensatory strategies;Patient/family education;Multimodal communcation approach;Internal/external aids    Potential to Achieve Goals  Good    Potential Considerations  Previous level of function       Patient will benefit from skilled therapeutic intervention in order to improve the following deficits and impairments:   Verbal apraxia    Problem List Patient Active Problem List   Diagnosis Date Noted  . Difficulty articulating words 03/07/2019  . Adjustment disorder with anxious mood 08/18/2018  . Obesity (BMI 30-39.9) 09/17/2016  . Internal hemorrhoid 09/16/2016  . Adjustment disorder with mixed anxiety and depressed mood 12/04/2015  . Primary insomnia 12/04/2015  . Prediabetes 10/09/2013  . Facial droop 10/09/2013  . Seizures ([place]) 08/24/2013  . Obesity, unspecified 10/17/2007  . 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 PES PLANUS, RIGHT 07/20/2007  . DYSTONIA 01/04/2007  . Major depressive disorder, recurrent episode, moderate ([place]) 03/18/2006  . Essential hypertension 03/18/2006  . PSORIASIS 03/18/2006    [name], Annye Rusk MS, CCC-SLP 06/07/2019, 11:20 AM  [facility] [address] [place], [place], [address] Phone: [phone]   Fax:  [phone]   Name:  [name] MRN: [id number] Date of Birth: 03-05-1955

## 2019-06-20 ENCOUNTER — Ambulatory Visit (INDEPENDENT_AMBULATORY_CARE_PROVIDER_SITE_OTHER): Payer: Medicaid Other | Admitting: Licensed Clinical Social Worker

## 2019-06-20 ENCOUNTER — Encounter (HOSPITAL_COMMUNITY): Payer: Self-pay | Admitting: Licensed Clinical Social Worker

## 2019-06-20 ENCOUNTER — Other Ambulatory Visit: Payer: Self-pay

## 2019-06-20 DIAGNOSIS — F331 Major depressive disorder, recurrent, moderate: Secondary | ICD-10-CM

## 2019-06-20 NOTE — Progress Notes (Addendum)
Virtual Visit via Telephone Note  I connected with [name] on 06/20/19 at 10:00 AM EDT by phone enabled telehealth application and verified that I am speaking with the correct person using two identifiers. Patient does not have a computer or the Internet.   I discussed the limitations of evaluation and management by telemedicine and the availability of in person appointments. The patient expressed understanding and agreed to proceed.  LOCATION:  Patient: Home Provider: Home  History of Present Illness: Pt was referred for OP therapy by her neurologist Dr. [place] Lions for depression.   Observations/Objective: Verbalize hopeful and positive statements regarding the future. Pt presented  anxious for her individual therapy session. Pt described her psychiatric symptoms and current life events. Patient reports she had a seizure on Sunday. Used socratic questions. Patient identified her trigger which caused the seizure and has coping skills that work for her. Patient discussed her family relationships currently, which is balanced currently. Patient discussed her relationship with the father of her daughter, past and present. Clinician utilized MI OARS to reflect and summarize thoughts and feelings.  Assessment and plan: Counselor will continue to meet with patient to address treatment plan goals. Patient will continue to follow recommendations of providers and implement skills learned in session.  Follow Up Instructions:   I discussed the assessment and treatment plan with the patient. The patient was provided an opportunity to ask questions and all were answered. The patient agreed with the plan and demonstrated an understanding of the instructions.   The patient was advised to call back or seek an in-person evaluation if the symptoms worsen or if the condition fails to improve as anticipated.  I provided 60 minutes of non-face-to-face time during this encounter.   Zachariah Pavek S,  LCAS

## 2019-06-21 ENCOUNTER — Other Ambulatory Visit: Payer: Self-pay | Admitting: Neurology

## 2019-06-21 ENCOUNTER — Ambulatory Visit (INDEPENDENT_AMBULATORY_CARE_PROVIDER_SITE_OTHER): Payer: Medicaid Other | Admitting: Neurology

## 2019-06-21 ENCOUNTER — Other Ambulatory Visit: Payer: Self-pay

## 2019-06-21 ENCOUNTER — Encounter: Payer: Self-pay | Admitting: Neurology

## 2019-06-21 VITALS — BP 118/74 | HR 80 | Ht 62.0 in | Wt 211.0 lb

## 2019-06-21 DIAGNOSIS — R569 Unspecified convulsions: Secondary | ICD-10-CM

## 2019-06-21 MED ORDER — LEVETIRACETAM ER 500 MG PO TB24: ORAL_TABLET | ORAL | 3 refills | Status: DC

## 2019-06-21 NOTE — Patient Instructions (Signed)
Great to see you! Continue Keppra XR 500mg  three times a day. Continue with avoiding seizure triggers. Discuss seeing an ENT doctor for the left ear ringing and hearing loss. Follow-up in 1 year, call for any changes.

## 2019-06-21 NOTE — Progress Notes (Signed)
NEUROLOGY FOLLOW UP OFFICE NOTE  [name] [id number] 06/12/1955  HISTORY OF PRESENT ILLNESS: I had the pleasure of seeing [name] in follow-up in the neurology clinic on 06/21/2019.  The patient was last evaluated in the neurology clinic via telephonic communication a year ago. She has a diagnosis of seizures with recurrent episodes of loss of consciousness with report of right-sided symptoms initially, but recently symptoms are preceded by pain and discomfort in her extremities (LE>UE). 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 She was started on Keppra at [facility] last 2015 for seizures. She continues to take Levetiracetam ER 500mg  TID without side effects. Over the past year, she reports 1-2 seizures a month. She had 2 on 4/2, one in the morning, then another in the afternoon. She did not take her medication that morning and was doing fast-paced walking. Joggers sat with her until she felt better. That afternoon, she had another one in the park where a man caught her and set her down. She denied losing consciousness, able to answer questions, saying she just needed to eat. She recalls it was hot that day. She had another one on 5/1 lasting 13-16 minutes, also on a hot day. Her last seizure was last 5/30 while cleaning her apartment. She started getting dizzy so she laid down in a fetal position and was shaking. She was awake the entire time and did not lose consciousness. No tongue bite or incontinence. She has found that when it is between 89-90 degrees, she cannot be outside. She has noticed staying away from stress and toxic people does help. She lives alone. She denies any headaches, focal numbness/tingling/weakness. Sleep is good, she likes the mirtazapine. She has noticed ringing and hearing loss in her left ear.   History on Initial Assessment 11/2014: This is a pleasant 65 yo RH woman with a history of hypertension and a  diagnosis of seizures. She reports that neurological symptoms started in 2005 when she would have involuntary movements of the right leg. She states she had lost her grandmother and this had an effect on her, and had symptoms since then. She had seen neurologist Dr. 2006 at that time and was diagnosed with focal limb dystomia, told that this may be caused by a chemical imbalance. She was treated with Botox but did not tolerate it. She reports seeing Dr. Aletha Halim from 2006 to 2009, but also at the same time she started having blackouts. She reports that she had a blackout in her doctor's office one time, but instead of being brought to [facility] ER, she was brought to [facility]. She has had blackouts so bad that neighbors would tell her she needed to go to the ER. She usually starts feeling pain in both legs, followed by right-sided shaking, numbness on the left side of her face, throat gets tight, then she falls to the floor. She lives by herself and has woken up on the floor. She reports that she can see herself shaking and would go into a fetal position to "ride it out."   In August 2015, she was brought to the ER due to recent increase in frequency of spells. She reported bilateral numbness and tingling as well as a feeling of discomfort in both upper and lower extremities. She has been told she has convulsions involving her legs. She reported urinary incontinence and tongue soreness. Her BP was very high at that time. 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 No definite gliosis of heterotopic gray matter. Her routine EEG was normal. She was discharged home on Keppra 1000mg  BID which she has been tolerating without side effects. She reports doing well with no similar symptoms until the October  2016. She brings a calendar of her events, She had one last 10/30/14. She reports she had been stressed because her daughter told her that day that she is moving to [place] to what her mother thinks is an unhealthy relationship. She had another episode on 10/30 while walking. On 11/25/14 she had 2 episodes, one lasting 12 minutes, another one lasting an hour. She reports being in the shower, she grabbed the handle and sat in the tub rocking in a fetal position, which she found helps. She believes stress is causing the recent seizures and talking to a therapist does help her. She states she does not know how to relax. 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 There is no history of febrile convulsions, CNS infections such as meningitis/encephalitis, significant traumatic brain injury, neurosurgical procedures, or family history of seizures.  Diagnostic Data: She had a 48-hour EEG which was normal. She reported numbness in her right hand and arm, sharp stomach pain, and dizziness. There were no epileptiform discharges seen during these events. The day she reported the dizziness, she did not push the button, but there was note a a transient significant increase in heart rate from 78 bpm to 168 bpm for around 2 minutes, she was not on video at that time. She had a myocardial perfusion scan last 11/2016 which showed an EF of 66%, small defect of moderate severity in the mid anterior and apical anterior location consistent with ischemia, but given normal systolic function, could not rule out breast attenuation artifact. She was started on a beta blocker with improvement in tachycardia, 30-day holter with normal sinus rhythm, no arrhythmia. She has been evaluated by Cardiology.    PAST MEDICAL HISTORY: Past Medical History:  Diagnosis Date  . Anxiety   . Carpal tunnel syndrome   . Diabetes mellitus without complication ([place])   . Diabetes mellitus, type II ([place])   .  DYSTONIA 01/04/2007   Qualifier: Diagnosis of  By: Zebedee Iba NP, Manuela Schwartz    . Facial droop 10/09/2013  . Hemorrhoid   . Hypertension   . Major depressive disorder, recurrent episode ([place]) 03/18/2006   Qualifier: Diagnosis of  By: Beryle Lathe    . Obesity, unspecified 10/17/2007   Qualifier: Diagnosis of  By: Jenne Campus PHD, Jeannie    . Prediabetes 10/09/2013  . Rectal bleeding 07/03/2015  . Seizures ([place])   . Seizures ([place]) 08/24/2013    MEDICATIONS: Current Outpatient Medications on File Prior to Visit  Medication Sig Dispense Refill  . amLODipine (NORVASC) 10 MG tablet Take 1 tablet (10 mg total) by mouth daily. 90 tablet 2  . aspirin EC 81 MG tablet Take 1 tablet (81 mg total) by mouth daily. 100 tablet 1  . Blood Pressure Monitor DEVI Use as directed to check home blood pressure 2-3 times a week 1 Device 0  . hydrochlorothiazide (HYDRODIURIL) 25 MG tablet Take 1 tablet (25 mg total) by mouth daily. 90 tablet 3  . levETIRAcetam (KEPPRA XR) 500 MG 24 hr tablet Take 1 tablet three times a day 270  tablet 3  . lisinopril (ZESTRIL) 40 MG tablet Take 1 tablet (40 mg total) by mouth daily. 90 tablet 2  . loratadine (CLARITIN) 10 MG tablet Take 1 tablet (10 mg total) by mouth daily. 30 tablet 2  . metFORMIN (GLUCOPHAGE) 500 MG tablet Take 1 tablet (500 mg total) by mouth daily with breakfast. 30 tablet 5  . mirtazapine (REMERON) 30 MG tablet Take 1 tablet (30 mg total) by mouth at bedtime. 90 tablet 1  . Vitamin D, Ergocalciferol, (DRISDOL) 1.25 MG (50000 UNIT) CAPS capsule Take 1 capsule (50,000 Units total) by mouth every 7 (seven) days. 12 capsule 1   No current facility-administered medications on file prior to visit.    ALLERGIES: Allergies  Allergen Reactions  . Codeine Anaphylaxis  . Shellfish Allergy Swelling and Rash    Throat Sweling  . Apple Cider Vinegar     syncope  . Cinnamon Hives  . Opium     Interferes with Keppra  . Banana Rash    Cannot eat any while taking KEPPRA  .  Catfish [Fish Allergy] Rash  . Orange Fruit [Citrus] Rash    Cannot have while taking KEPPRA  . Tomato Itching and Rash    FAMILY HISTORY: Family History  Problem Relation Age of Onset  . Asthma Mother   . Hypertension Father   . Heart disease Father   . Stroke Father   . Cancer Sister        uterus cancer   . Diabetes Maternal Uncle   . Breast cancer Cousin   . Colon cancer Neg Hx     SOCIAL HISTORY: Social History   Socioeconomic History  . Marital status: Divorced    Spouse name: Not on file  . Number of children: 1  . Years of education: Not on file  . Highest education level: Not on file  Occupational History  . Not on file  Tobacco Use  . Smoking status: Never Smoker  . Smokeless tobacco: Never Used  Substance and Sexual Activity  . Alcohol use: No    Alcohol/week: 0.0 standard drinks  . Drug use: No  . Sexual activity: Never  Other Topics Concern  . Not on file  Social History Narrative   Right handed    Lives alone    Lives in appartment   Social Determinants of Health   Financial Resource Strain:   . Difficulty of Paying Living Expenses:   Food Insecurity:   . Worried About Programme researcher, broadcasting/film/video in the Last Year:   . Barista in the Last Year:   Transportation Needs:   . Freight forwarder (Medical):   [name] Lack of Transportation (Non-Medical):   Physical Activity:   . Days of Exercise per Week:   . Minutes of Exercise per Session:   Stress:   . Feeling of Stress :   Social Connections:   . Frequency of Communication with Friends and Family:   . Frequency of Social Gatherings with Friends and Family:   . Attends Religious Services:   . Active Member of Clubs or Organizations:   . Attends Banker Meetings:   [name] Marital Status:   Intimate Partner Violence:   . Fear of Current or Ex-Partner:   . Emotionally Abused:   [name] Physically Abused:   . Sexually Abused:    PHYSICAL EXAM: Vitals:   06/21/19 0938  BP: 118/74   Pulse: 80  SpO2: 97%   General: No acute distress  Head:  Normocephalic/atraumatic Skin/Extremities: No rash, no edema Neurological Exam: alert and oriented to person, place, and time. No aphasia or dysarthria. Fund of knowledge is appropriate.  Recent and remote memory are intact.  Attention and concentration are normal.  Cranial nerves: Pupils equal, round, reactive to light. Extraocular movements intact with no nystagmus. Visual fields full. No facial asymmetry. Motor: Bulk and tone normal, muscle strength 5/5 throughout with no pronator drift. Finger to nose testing intact.  Gait slightly wide-based, no ataxia   IMPRESSION: This is a pleasant 64 yo RH woman with a history of hypertension and recurrent episodes of loss of consciousness with report of right-sided symptoms initially, but recently symptoms are preceded by pain and discomfort in her extremities (LE>UE), or provoked by stress with her daughter. 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 Her 48-hour EEG was normal, there was an episode of dizziness, she was noted to have tachycardia up to 168bpm with no epileptiform correlate seen. She reports 1-2 events a month, usually triggered by heat or stress. We discussed avoidance of triggers, would consider checking for hypoglycemia during these events. Continue Levetiracetam 500mg  TID, refills sent. She does not drive. Continue follow-up with Behavioral Health. She does not drive. Follow-up in 1 year, she knows to call for any changes.   Thank you for allowing me to participate in her care.  Please do  not hesitate to call for any questions or concerns.   [facility], M.D.   CC: Dr. 

## 2019-07-03 ENCOUNTER — Encounter: Payer: Self-pay | Admitting: Pharmacist

## 2019-07-03 ENCOUNTER — Ambulatory Visit: Payer: Medicaid Other | Attending: Internal Medicine | Admitting: Pharmacist

## 2019-07-03 ENCOUNTER — Other Ambulatory Visit: Payer: Self-pay

## 2019-07-03 ENCOUNTER — Other Ambulatory Visit: Payer: Self-pay | Admitting: Internal Medicine

## 2019-07-03 VITALS — BP 139/88 | HR 69

## 2019-07-03 DIAGNOSIS — E1140 Type 2 diabetes mellitus with diabetic neuropathy, unspecified: Secondary | ICD-10-CM | POA: Diagnosis not present

## 2019-07-03 DIAGNOSIS — Z8249 Family history of ischemic heart disease and other diseases of the circulatory system: Secondary | ICD-10-CM | POA: Insufficient documentation

## 2019-07-03 DIAGNOSIS — I10 Essential (primary) hypertension: Secondary | ICD-10-CM | POA: Insufficient documentation

## 2019-07-03 MED FILL — VIT D2 1.25 MG (50,000 UNIT: 1.25 MG | 84 days supply | Qty: 12 | Fill #1

## 2019-07-03 NOTE — Progress Notes (Signed)
   S: PCP: Dr. Laural Benes     Patient arrives in good spirts. Presents to the clinic for hypertension management. Patient was referred by Dr. Laural Benes on 05/02/2019. I saw her in May and BP had improved. I made no changes in her medications. Today, she informs me that she is doing well. She was seen by Neurology on the second of this month and her BP was at goal.   Denies CP, SOB, blurred vision, or HA. Denies LE edema.   Patient reports adherence with medications. Took today.  Current BP Medications include:   - Amlodipine 10 mg daily - Lisinopril 40 mg daily  - HCTZ 25 mg daily   Dietary habits include:  - Garlic salt with sea salt; lists as her "guilty pleasure"  - Denies caffeine Exercise habits include: - Walks ~30 minutes/day Sun-Fri - Sometimes limited by diabetic neuropathy  Family / Social history:  - FH: Cardiac disease, HTN (father) - Tobacco: denies  - Alcohol: denies   O:  Today's Vitals   07/03/19 0949  BP: 139/88  Pulse: 69   There is no height or weight on file to calculate BMI.  Does not check blood pressure at home.   Last 3 Office BP readings: BP Readings from Last 3 Encounters:  07/03/19 139/88  06/21/19 118/74  06/01/19 (!) 146/89   BMET    Component Value Date/Time   NA 135 02/15/2019 0849   K 3.9 02/15/2019 0849   CL 96 02/15/2019 0849   CO2 23 02/15/2019 0849   GLUCOSE 77 02/15/2019 0849   GLUCOSE 86 02/18/2016 0948   BUN 13 02/15/2019 0849   CREATININE 0.91 02/15/2019 0849   CREATININE 0.89 02/18/2016 0948   CALCIUM 9.9 05/02/2019 1011   GFRNONAA 68 02/15/2019 0849   GFRNONAA 73 08/17/2014 1013   GFRAA 79 02/15/2019 0849   GFRAA 84 08/17/2014 1013   Renal function: CrCl cannot be calculated (Patient's most recent lab result is older than the maximum 21 days allowed.).  A/P: Hypertension longstanding currently above goal on medications but improved. BP Goal <130/80 mmHg. Patient is adherent with current medications. BP at Neurology  looked good. Will make no changes. -Continued current anti-hypertensives.  -Counseled on lifestyle modifications for blood pressure control including reduced dietary sodium, increased exercise, adequate sleep  Results reviewed and written information provided. Total time in face-to-face counseling 30 minutes.   F/U Clinic Visit with PCP in Aug.    Butch Penny, PharmD, CPP Clinical Pharmacist [facility] [phone]

## 2019-07-04 ENCOUNTER — Ambulatory Visit (INDEPENDENT_AMBULATORY_CARE_PROVIDER_SITE_OTHER): Payer: Medicaid Other | Admitting: Licensed Clinical Social Worker

## 2019-07-04 ENCOUNTER — Encounter (HOSPITAL_COMMUNITY): Payer: Self-pay | Admitting: Licensed Clinical Social Worker

## 2019-07-04 DIAGNOSIS — F331 Major depressive disorder, recurrent, moderate: Secondary | ICD-10-CM | POA: Diagnosis not present

## 2019-07-04 NOTE — Progress Notes (Signed)
Virtual Visit via Telephone Note  I connected with [name] on 07/04/19 at 10:00 AM EDT by phone enabled telehealth application and verified that I am speaking with the correct person using two identifiers. Patient does not have a computer or the Internet.   I discussed the limitations of evaluation and management by telemedicine and the availability of in person appointments. The patient expressed understanding and agreed to proceed.  LOCATION:  Patient: Home Provider: Home  History of Present Illness: Pt was referred for OP therapy by her neurologist Dr. [place] Lions for depression.   Observations/Objective: Verbalize hopeful and positive statements regarding the future. Pt presented  anxious for her individual therapy session. Pt described her psychiatric symptoms and current life events. Patient reports on her dr visits within the last week: Dr. Karel Jarvis, [facility]. Used socratic questions. Patient reports she is having trouble seeing distances and has a ringing in one ear. Suggested she contact her PCP for a referral.  Patient reports her weight has increased as well as her BP. Patient was encouraged to continue walking daily and "throw away her garlic salt." Patient reports she had an episode last weekend, not a seizure. Pt described the episode, what lead to the episode. Again, reminded patient of the importance of self care: taking medications daily, stay away from toxic people, take daily walks, regular sleep patterns. Cln provided psychoeducation on self-care, which relies on her increased  self-awareness.    Assessment and plan: Counselor will continue to meet with patient to address treatment plan goals. Patient will continue to follow recommendations of providers and implement skills learned in session.  Follow Up Instructions:   I discussed the assessment and treatment plan with the patient. The patient was provided an opportunity to ask questions and all were  answered. The patient agreed with the plan and demonstrated an understanding of the instructions.   The patient was advised to call back or seek an in-person evaluation if the symptoms worsen or if the condition fails to improve as anticipated.  I provided 60 minutes of non-face-to-face time during this encounter.   Nataley Bahri S, LCAS

## 2019-07-17 ENCOUNTER — Telehealth: Payer: Self-pay | Admitting: Internal Medicine

## 2019-07-17 DIAGNOSIS — H9313 Tinnitus, bilateral: Secondary | ICD-10-CM

## 2019-07-17 NOTE — Telephone Encounter (Signed)
Patient called requesting to be referred out to a specialist because she is having ringing in her ear. Please f/u. Ok to leave a VM

## 2019-07-18 ENCOUNTER — Encounter (HOSPITAL_COMMUNITY): Payer: Self-pay | Admitting: Licensed Clinical Social Worker

## 2019-07-18 ENCOUNTER — Ambulatory Visit (INDEPENDENT_AMBULATORY_CARE_PROVIDER_SITE_OTHER): Payer: Medicaid Other | Admitting: Licensed Clinical Social Worker

## 2019-07-18 ENCOUNTER — Other Ambulatory Visit: Payer: Self-pay

## 2019-07-18 DIAGNOSIS — F331 Major depressive disorder, recurrent, moderate: Secondary | ICD-10-CM

## 2019-07-18 NOTE — Progress Notes (Signed)
Virtual Visit via Telephone Note  I connected with [name] on 07/18/19 at 10:00 AM EDT by phone enabled telehealth application and verified that I am speaking with the correct person using two identifiers. Patient does not have a computer or the Internet.   I discussed the limitations of evaluation and management by telemedicine and the availability of in person appointments. The patient expressed understanding and agreed to proceed.  LOCATION:  Patient: Home Provider: Home  History of Present Illness: Pt was referred for OP therapy by her neurologist Dr. Tomah Lions for depression.   Observations/Objective: Verbalize hopeful and positive statements regarding the future. Pt presented  anxious for her individual therapy session. Pt described her psychiatric symptoms and current life events. Patient reports her moods are stable her current family stressors are more stable. Used socratic questions. Patient contacted her PCP for referrals for eye care and ringing in her ears. Patient reports no seizures since last session and "I threw out the garlic salt." Again, reminded patient of the importance of self care: taking medications daily, stay away from toxic people, take daily walks, regular sleep patterns. Cln discussed with patient the importance of walking early before it gets too hot and to hydrate well.     Assessment and plan: Counselor will continue to meet with patient to address treatment plan goals. Patient will continue to follow recommendations of providers and implement skills learned in session.  Follow Up Instructions:   I discussed the assessment and treatment plan with the patient. The patient was provided an opportunity to ask questions and all were answered. The patient agreed with the plan and demonstrated an understanding of the instructions.   The patient was advised to call back or seek an in-person evaluation if the symptoms worsen or if the condition fails to improve as  anticipated.  I provided 60 minutes of non-face-to-face time during this encounter.   Cherree Conerly S, LCAS

## 2019-07-20 NOTE — Telephone Encounter (Signed)
Pt is requesting a referral to ENT

## 2019-08-01 ENCOUNTER — Ambulatory Visit (INDEPENDENT_AMBULATORY_CARE_PROVIDER_SITE_OTHER): Payer: Medicaid Other | Admitting: Licensed Clinical Social Worker

## 2019-08-01 ENCOUNTER — Other Ambulatory Visit: Payer: Self-pay

## 2019-08-01 ENCOUNTER — Encounter (HOSPITAL_COMMUNITY): Payer: Self-pay | Admitting: Licensed Clinical Social Worker

## 2019-08-01 DIAGNOSIS — F331 Major depressive disorder, recurrent, moderate: Secondary | ICD-10-CM | POA: Diagnosis not present

## 2019-08-01 NOTE — Progress Notes (Signed)
Virtual Visit via Telephone Note  I connected with [name] on 08/01/19 at 10:00 AM EDT by phone enabled telehealth application and verified that I am speaking with the correct person using two identifiers. Patient does not have a computer or the Internet.   I discussed the limitations of evaluation and management by telemedicine and the availability of in person appointments. The patient expressed understanding and agreed to proceed.  LOCATION:  Patient: Home Provider: Home  History of Present Illness: Pt was referred for OP therapy by her neurologist Dr. Rapides Lions for depression.   Observations/Objective: Verbalize hopeful and positive statements regarding the future. Pt presented  anxious for her individual therapy session. Pt described her psychiatric symptoms and current life events. Patient reports her moods are stable her current family stressors are more stable. Used socratic questions. Patient has not heard from her PCP for referrals for eye care and ringing in her ears. Suggested patient contact her PCP. Patient reports no seizures since last session. Patient still walks every morning. Cln provided strategies for safety and care while walking early.   Assessment and plan: Counselor will continue to meet with patient to address treatment plan goals. Patient will continue to follow recommendations of providers and implement skills learned in session.  Follow Up Instructions:   I discussed the assessment and treatment plan with the patient. The patient was provided an opportunity to ask questions and all were answered. The patient agreed with the plan and demonstrated an understanding of the instructions.   The patient was advised to call back or seek an in-person evaluation if the symptoms worsen or if the condition fails to improve as anticipated.  I provided 60 minutes of non-face-to-face time during this encounter.   Dyanne Yorks S, LCAS

## 2019-08-15 ENCOUNTER — Ambulatory Visit (INDEPENDENT_AMBULATORY_CARE_PROVIDER_SITE_OTHER): Payer: Medicaid Other | Admitting: Licensed Clinical Social Worker

## 2019-08-15 ENCOUNTER — Encounter (HOSPITAL_COMMUNITY): Payer: Self-pay | Admitting: Licensed Clinical Social Worker

## 2019-08-15 ENCOUNTER — Other Ambulatory Visit: Payer: Self-pay

## 2019-08-15 DIAGNOSIS — F331 Major depressive disorder, recurrent, moderate: Secondary | ICD-10-CM | POA: Diagnosis not present

## 2019-08-15 NOTE — Progress Notes (Signed)
Virtual Visit via Telephone Note  I connected with [name] on 08/15/19 at 10:00 AM EDT by phone enabled telehealth application and verified that I am speaking with the correct person using two identifiers. Patient does not have a computer or the Internet.   I discussed the limitations of evaluation and management by telemedicine and the availability of in person appointments. The patient expressed understanding and agreed to proceed.  LOCATION:  Patient: Home Provider: Home  History of Present Illness: Pt was referred for OP therapy by her neurologist Dr. Strafford [name] for depression.   Observations/Objective: Verbalize hopeful and positive statements regarding the future. Pt presented  anxious for her individual therapy session. Pt described her psychiatric symptoms and current life events. Patient reports her moods are stable & her current family stressors are more stable. Cln assessed family dynamics. Used socratic and clarifying questions. Patient still has not heard from her PCP for referrals for eye care and ringing in her ears. Again, suggested patient contact her PCP. Patient reports no seizures since last session. Reminded patient of what a facilitates most of her seizures and how to take precautions to limit her stress. Patient still walks every morning and stays well hydrated.     Assessment and plan: Counselor will continue to meet with patient to address treatment plan goals. Patient will continue to follow recommendations of providers and implement skills learned in session.  Follow Up Instructions:   I discussed the assessment and treatment plan with the patient. The patient was provided an opportunity to ask questions and all were answered. The patient agreed with the plan and demonstrated an understanding of the instructions.   The patient was advised to call back or seek an in-person evaluation if the symptoms worsen or if the condition fails to improve as anticipated.  I  provided 60 minutes of non-face-to-face time during this encounter.   [name] S, LCAS

## 2019-08-16 ENCOUNTER — Telehealth: Payer: Self-pay | Admitting: Internal Medicine

## 2019-08-16 NOTE — Telephone Encounter (Signed)
Please advise.  Copied from CRM [id number]. Topic: General - Inquiry >> Aug 15, 2019 11:20 AM Leary Roca wrote: Reason for CRM: Pt is calling about recent referral to ENT for ringing in the left ear. She has her phone set to where she doesn't get calls that's not listed on her contact list . She is requesting a referral by mail for ENT . Please reach out to pt for other options

## 2019-08-17 ENCOUNTER — Telehealth (INDEPENDENT_AMBULATORY_CARE_PROVIDER_SITE_OTHER): Payer: Medicaid Other | Admitting: Psychiatry

## 2019-08-17 ENCOUNTER — Other Ambulatory Visit: Payer: Self-pay

## 2019-08-17 ENCOUNTER — Other Ambulatory Visit: Payer: Self-pay | Admitting: Psychiatry

## 2019-08-17 DIAGNOSIS — F331 Major depressive disorder, recurrent, moderate: Secondary | ICD-10-CM | POA: Diagnosis not present

## 2019-08-17 DIAGNOSIS — F5101 Primary insomnia: Secondary | ICD-10-CM | POA: Diagnosis not present

## 2019-08-17 MED ORDER — MIRTAZAPINE 30 MG PO TABS: 30.0000 mg | ORAL_TABLET | Freq: Every day | ORAL | 1 refills | Status: DC

## 2019-08-17 MED FILL — MIRTAZAPINE 30 MG TABLET: 30 | 90 days supply | Qty: 90 | Fill #0

## 2019-08-17 NOTE — Telephone Encounter (Signed)
I Spoke to patient and she is going to contact  Dr Suszanne Conners office   Sent Referral on 7/2021to Dr Janeece Riggers [facility] Ph# [phone] Fax # [phone] address [address] [place], [place] [address] They will contact the patient to schedule an appointment.

## 2019-08-17 NOTE — Progress Notes (Signed)
Virtual Visit via Telephone Note  I connected with [name] on 08/17/19 at 10:00 AM EDT by telephone and verified that I am speaking with the correct person using two identifiers.  Location: Patient: home Provider: office   I discussed the limitations, risks, security and privacy concerns of performing an evaluation and management service by telephone and the availability of in person appointments. I also discussed with the patient that there may be a patient responsible charge related to this service. The patient expressed understanding and agreed to proceed.   History of Present Illness: Overall [name] states she is doing well. She is active and social. COVID remains a stressors and causes anxiety. She has not been vaccinated due to religious beliefs. [name] does not trust the government and is noticing that more people are voicing their concerns as well. [name] has been praying which helps. Her depression is stable and she is not noticing any symptoms. She is walking at most morning. She denies SI/HI.    Observations/Objective:  General Appearance: unable to assess  Eye Contact:  unable to assess  Speech:  Clear and Coherent and Slow  Volume:  Normal  Mood:  Euthymic  Affect:  Full Range  Thought Process:  Coherent and Descriptions of Associations: Circumstantial  Orientation:  Full (Time, Place, and Person)  Thought Content:  Paranoid Ideation  Suicidal Thoughts:  No  Homicidal Thoughts:  No  Memory:  Immediate;   Fair  Judgement:  Fair  Insight:  Fair  Psychomotor Activity: unable to assess  Concentration:  Concentration: Fair  Recall:  [employer] of Knowledge:  Fair  Language:  Fair  Akathisia:  unable to assess  Handed:  Right  AIMS (if indicated):     Assets:  Communication Skills Desire for Improvement Financial Resources/Insurance Housing Social Support Vocational/Educational  ADL's:  unable to assess  Cognition:  WNL  Sleep:         Assessment and  Plan: MDD- recurrent, moderate; Insomnia  Remeron 30mg  po qHS  Encouraged to continue therapy   Follow Up Instructions: In 6 months or sooner if needed   I discussed the assessment and treatment plan with the patient. The patient was provided an opportunity to ask questions and all were answered. The patient agreed with the plan and demonstrated an understanding of the instructions.   The patient was advised to call back or seek an in-person evaluation if the symptoms worsen or if the condition fails to improve as anticipated.  I provided 15 minutes of non-face-to-face time during this encounter.   , MD

## 2019-08-28 ENCOUNTER — Telehealth: Payer: Self-pay | Admitting: Internal Medicine

## 2019-08-28 NOTE — Telephone Encounter (Signed)
Called to inform the office that they received a referral for patient, but patient has medicaid and they do not process for medicaid.  Please advise and call to discuss at [phone]

## 2019-08-29 ENCOUNTER — Ambulatory Visit (INDEPENDENT_AMBULATORY_CARE_PROVIDER_SITE_OTHER): Payer: Medicaid Other | Admitting: Licensed Clinical Social Worker

## 2019-08-29 ENCOUNTER — Other Ambulatory Visit: Payer: Self-pay

## 2019-08-29 ENCOUNTER — Encounter (HOSPITAL_COMMUNITY): Payer: Self-pay | Admitting: Licensed Clinical Social Worker

## 2019-08-29 DIAGNOSIS — F331 Major depressive disorder, recurrent, moderate: Secondary | ICD-10-CM | POA: Diagnosis not present

## 2019-08-29 NOTE — Progress Notes (Signed)
Virtual Visit via Telephone Note  I connected with [name] on 08/29/19 at 11:00 AM EDT by phone enabled telehealth application and verified that I am speaking with the correct person using two identifiers. Patient does not have a computer or the Internet.   I discussed the limitations of evaluation and management by telemedicine and the availability of in person appointments. The patient expressed understanding and agreed to proceed.  LOCATION:  Patient: Home Provider: Home  History of Present Illness: Pt was referred for OP therapy by her neurologist Dr.  Lions for depression.   Observations/Objective: Verbalize hopeful and positive statements regarding the future. Pt presented  wnl for her individual therapy session. Reviewed tx plan with patient who verbalized acceptance of the plan. Pt described her psychiatric symptoms and current life events. Patient reports her moods are stable & her current family stressors are more stable. Her daughter's father's Parkinson's disease is progressing, so patient is helping her daughter deal with the progression of his disease and her grief. Cln provided psychoeducation on grief. Because of this,  her daughter's moods have stabilized which has helped patient be more stable. Patient has not had a seizure since the spring.  Patient saw her psychiatrist, Dr. Michae Kava, who did not change any medications. Patient continues to walk daily, surround herself with positive people, hydrate, wear her mask.     Assessment and plan: Counselor will continue to meet with patient to address treatment plan goals. Patient will continue to follow recommendations of providers and implement skills learned in session.  Follow Up Instructions:   I discussed the assessment and treatment plan with the patient. The patient was provided an opportunity to ask questions and all were answered. The patient agreed with the plan and demonstrated an understanding of the instructions.    The patient was advised to call back or seek an in-person evaluation if the symptoms worsen or if the condition fails to improve as anticipated.  I provided 60 minutes of non-face-to-face time during this encounter.   Lora Glomski S, LCAS

## 2019-08-30 NOTE — Telephone Encounter (Signed)
New referral sent to   [facility] ph. # [phone] address [address] .They will contact the patient to schedule an appointment .

## 2019-09-05 ENCOUNTER — Ambulatory Visit: Payer: Medicaid Other | Attending: Internal Medicine | Admitting: Internal Medicine

## 2019-09-05 ENCOUNTER — Other Ambulatory Visit: Payer: Self-pay | Admitting: Internal Medicine

## 2019-09-05 DIAGNOSIS — E559 Vitamin D deficiency, unspecified: Secondary | ICD-10-CM

## 2019-09-05 DIAGNOSIS — Z7189 Other specified counseling: Secondary | ICD-10-CM

## 2019-09-05 DIAGNOSIS — T464X5A Adverse effect of angiotensin-converting-enzyme inhibitors, initial encounter: Secondary | ICD-10-CM | POA: Diagnosis not present

## 2019-09-05 DIAGNOSIS — G40909 Epilepsy, unspecified, not intractable, without status epilepticus: Secondary | ICD-10-CM

## 2019-09-05 DIAGNOSIS — R058 Other specified cough: Secondary | ICD-10-CM

## 2019-09-05 DIAGNOSIS — I10 Essential (primary) hypertension: Secondary | ICD-10-CM

## 2019-09-05 DIAGNOSIS — R05 Cough: Secondary | ICD-10-CM | POA: Diagnosis not present

## 2019-09-05 MED ORDER — VITAMIN D (ERGOCALCIFEROL) 1.25 MG (50000 UNIT) PO CAPS: 50000.0000 [IU] | ORAL_CAPSULE | ORAL | 1 refills | Status: DC

## 2019-09-05 MED ORDER — LOSARTAN POTASSIUM 50 MG PO TABS: 50.0000 mg | ORAL_TABLET | Freq: Every day | ORAL | 3 refills | Status: DC

## 2019-09-05 MED FILL — VIT D2 1.25 MG (50,000 UNIT: 1.25 MG | 84 days supply | Qty: 12 | Fill #0

## 2019-09-05 MED FILL — LOSARTAN POTASSIUM 50 MG TA: 50 | 90 days supply | Qty: 90 | Fill #0

## 2019-09-05 NOTE — Progress Notes (Signed)
Virtual Visit via Telephone Note Due to current restrictions/limitations of in-office visits due to the COVID-19 pandemic, this scheduled clinical appointment was converted to a telehealth visit  I connected with [name] on 09/05/19 at 10:45 a.m EDT by telephone and verified that I am speaking with the correct person using two identifiers. I am in my office.  The patient is at home.  Only the patient and myself participated in this encounter.  I discussed the limitations, risks, security and privacy concerns of performing an evaluation and management service by telephone and the availability of in person appointments. I also discussed with the patient that there may be a patient responsible charge related to this service. The patient expressed understanding and agreed to proceed.   History of Present Illness: Pt with hx of depression, HTN, Sz dsand pseudoSZ, pre-DM, trichotillomania, internal hemorrhoids, seasonal allergies. Last evaluated 04/2019.  Purpose of today's visit is chronic disease management.  HTN:  Since last visit with me, patient had seen the clinical pharmacist x2 for follow-up on blood pressure.  But pressure had improved.  He did not make any further changes in blood pressure medications on her last visit with him. -compliant with meds and low salt intake.  Lisinopril causing dry cough. -has home BP device but has not been using.   Seizure disorder/pseudoseizure: She saw Dr. Benny Lennert no 06/21/2019.  Not reviewed. Patient to continue on Keppra.  Hypercalcemia: Work-up so far was negative.  Vitamin D level was found to be 8.  I started her on high-dose vitamin D once a week.  Received letter from SSI last wk.  Her SSI is up for review and she has to go to court.  She is stressed out and feeling down about this.  She is working with a Clinical research associate.  HM: still on the fence about getting the COVID-19 vaccine. Outpatient Encounter Medications as of 09/05/2019  Medication Sig  .  amLODipine (NORVASC) 10 MG tablet Take 1 tablet (10 mg total) by mouth daily.  [name] aspirin EC 81 MG tablet Take 1 tablet (81 mg total) by mouth daily.  . Blood Pressure Monitor DEVI Use as directed to check home blood pressure 2-3 times a week  . hydrochlorothiazide (HYDRODIURIL) 25 MG tablet Take 1 tablet (25 mg total) by mouth daily.  [name] levETIRAcetam (KEPPRA XR) 500 MG 24 hr tablet Take 1 tablet three times a day  . lisinopril (ZESTRIL) 40 MG tablet Take 1 tablet (40 mg total) by mouth daily.  [name] loratadine (CLARITIN) 10 MG tablet TAKE 1 TABLET (10 MG TOTAL) BY MOUTH DAILY.  . metFORMIN (GLUCOPHAGE) 500 MG tablet Take 1 tablet (500 mg total) by mouth daily with breakfast.  . mirtazapine (REMERON) 30 MG tablet Take 1 tablet (30 mg total) by mouth at bedtime.  . Vitamin D, Ergocalciferol, (DRISDOL) 1.25 MG (50000 UNIT) CAPS capsule Take 1 capsule (50,000 Units total) by mouth every 7 (seven) days.   No facility-administered encounter medications on file as of 09/05/2019.  \    Observations/Objective: Results for orders placed or performed in visit on 05/02/19  Protein electrophoresis, serum  Result Value Ref Range   Total Protein 7.4 6.0 - 8.5 g/dL   Albumin ELP 3.5 2.9 - 4.4 g/dL   Alpha 1 0.2 0.0 - 0.4 g/dL   Alpha 2 0.9 0.4 - 1.0 g/dL   Beta 1.2 0.7 - 1.3 g/dL   Gamma Globulin 1.6 0.4 - 1.8 g/dL   M-Spike, % Not Observed Not Observed g/dL  Globulin, Total 3.9 2.2 - 3.9 g/dL   A/G Ratio 0.9 0.7 - 1.7   Please Note: Comment    Interpretation: Comment   VITAMIN D 25 Hydroxy (Vit-D Deficiency, Fractures)  Result Value Ref Range   Vit D, 25-Hydroxy 8.0 (L) 30.0 - 100.0 ng/mL  PTH-Related Peptide  Result Value Ref Range   PTH-related peptide <2.0 pmol/L  PTH, Intact and Calcium  Result Value Ref Range   Calcium 9.9 8.7 - 10.3 mg/dL   PTH 62 15 - 65 pg/mL   PTH Interp Comment   TSH  Result Value Ref Range   TSH 2.510 0.450 - 4.500 uIU/mL  POCT glucose (manual entry)  Result  Value Ref Range   POC Glucose 100 (A) 70 - 99 mg/dl     Assessment and Plan: 1. Essential hypertension Encourage patient to check blood pressure at least twice a week with goal being 130/80 or lower. Stop lisinopril since it is causing cough. Change to Cozaar 50 mg instead. - losartan (COZAAR) 50 MG tablet; Take 1 tablet (50 mg total) by mouth daily.  Dispense: 90 tablet; Refill: 3  2. Cough due to ACE inhibitor See #1 above.  3. Seizure disorder (HCC) Stable and followed by neurology.  4. Hypercalcemia Work-up so far negative.  5. Vitamin D deficiency Continue high-dose vitamin D supplement - Vitamin D, Ergocalciferol, (DRISDOL) 1.25 MG (50000 UNIT) CAPS capsule; Take 1 capsule (50,000 Units total) by mouth every 7 (seven) days.  Dispense: 12 capsule; Refill: 1  6. COVID-19 education Strongly advised patient again to consider getting the COVID-19 vaccine. Discussed with her that the vaccines are safe and effective.   Follow Up Instructions: 4 mths   I discussed the assessment and treatment plan with the patient. The patient was provided an opportunity to ask questions and all were answered. The patient agreed with the plan and demonstrated an understanding of the instructions.   The patient was advised to call back or seek an in-person evaluation if the symptoms worsen or if the condition fails to improve as anticipated.  I provided 10 minutes of non-face-to-face time during this encounter.   Jonah Blue, MD

## 2019-09-12 ENCOUNTER — Ambulatory Visit (INDEPENDENT_AMBULATORY_CARE_PROVIDER_SITE_OTHER): Payer: Medicaid Other | Admitting: Licensed Clinical Social Worker

## 2019-09-12 ENCOUNTER — Encounter (HOSPITAL_COMMUNITY): Payer: Self-pay | Admitting: Licensed Clinical Social Worker

## 2019-09-12 ENCOUNTER — Other Ambulatory Visit: Payer: Self-pay

## 2019-09-12 ENCOUNTER — Ambulatory Visit (HOSPITAL_COMMUNITY): Payer: Medicaid Other | Admitting: Licensed Clinical Social Worker

## 2019-09-12 DIAGNOSIS — F331 Major depressive disorder, recurrent, moderate: Secondary | ICD-10-CM | POA: Diagnosis not present

## 2019-09-12 NOTE — Progress Notes (Signed)
Virtual Visit via Telephone Note  I connected with [name] on 09/12/19 at 11:00 AM EDT by phone enabled telehealth application and verified that I am speaking with the correct person using two identifiers. Patient does not have a computer or the Internet.   I discussed the limitations of evaluation and management by telemedicine and the availability of in person appointments. The patient expressed understanding and agreed to proceed.  LOCATION:  Patient: Home Provider: Home  History of Present Illness: Pt was referred for OP therapy by her neurologist Dr. [place] Lions for depression.   Observations/Objective: Verbalize hopeful and positive statements regarding the future. Pt presented anxious and depressed for her individual therapy session.  Pt described her psychiatric symptoms and current life events. Patient was whispering at the beginning of the session because she was at her daughter's house, which is not a good situation for patient and her stress/anxiety. Used mindfulness skills with patient to assist with her current increase in anxiety symptoms. Patient reports she feels sick due to an upcoming appointment with social security. Used CBT to assist patient with her fear.       Assessment and plan: Counselor will continue to meet with patient to address treatment plan goals. Patient will continue to follow recommendations of providers and implement skills learned in session.  Follow Up Instructions:   I discussed the assessment and treatment plan with the patient. The patient was provided an opportunity to ask questions and all were answered. The patient agreed with the plan and demonstrated an understanding of the instructions.   The patient was advised to call back or seek an in-person evaluation if the symptoms worsen or if the condition fails to improve as anticipated.  I provided 45 minutes of non-face-to-face time during this encounter.   Jasmyne Lodato S, LCAS

## 2019-09-18 ENCOUNTER — Encounter (HOSPITAL_COMMUNITY): Payer: Self-pay | Admitting: Licensed Clinical Social Worker

## 2019-09-18 ENCOUNTER — Ambulatory Visit (INDEPENDENT_AMBULATORY_CARE_PROVIDER_SITE_OTHER): Payer: Medicaid Other | Admitting: Licensed Clinical Social Worker

## 2019-09-18 ENCOUNTER — Other Ambulatory Visit: Payer: Self-pay

## 2019-09-18 DIAGNOSIS — F331 Major depressive disorder, recurrent, moderate: Secondary | ICD-10-CM

## 2019-09-18 NOTE — Progress Notes (Signed)
Virtual Visit via Telephone Note  I connected with [name] on 09/18/19 at  4:00 PM EDT by phone enabled telehealth application and verified that I am speaking with the correct person using two identifiers. Patient does not have a computer or the Internet.   I discussed the limitations of evaluation and management by telemedicine and the availability of in person appointments. The patient expressed understanding and agreed to proceed.  LOCATION:  Patient: Home Provider: Home  History of Present Illness: Pt was referred for OP therapy by her neurologist Dr. [place] Lions for depression.   Observations/Objective: Verbalize hopeful and positive statements regarding the future. Pt presented WNL for her individual therapy session. Pt described her psychiatric symptoms and current life events. "My moods have been more stable this week. My daughter has been stable so it allows my moods to be more stable." Patient described her continued fear of the COVID vaccine. Used socratic questions.           Assessment and plan: Counselor will continue to meet with patient to address treatment plan goals. Patient will continue to follow recommendations of providers and implement skills learned in session.  Follow Up Instructions:   I discussed the assessment and treatment plan with the patient. The patient was provided an opportunity to ask questions and all were answered. The patient agreed with the plan and demonstrated an understanding of the instructions.   The patient was advised to call back or seek an in-person evaluation if the symptoms worsen or if the condition fails to improve as anticipated.  I provided 45 minutes of non-face-to-face time during this encounter.   Christabella Alvira S, LCAS

## 2019-09-19 MED FILL — METFORMIN HCL 500 MG TABS: 500 | 60 days supply | Qty: 60 | Fill #2

## 2019-09-19 MED FILL — LORATADINE 10 MG TABLET: 10 | 30 days supply | Qty: 30 | Fill #0

## 2019-09-19 MED FILL — LOSARTAN POTASSIUM 50 MG TA: 50 | 90 days supply | Qty: 90 | Fill #0

## 2019-09-19 MED FILL — LEVETIRACETAM ER 500 MG TAB: 500 | 90 days supply | Qty: 270 | Fill #0

## 2019-09-19 MED FILL — AMLODIPINE BESYLATE 10 MG T: 10 | 90 days supply | Qty: 90 | Fill #2

## 2019-09-19 MED FILL — VIT D2 1.25 MG (50,000 UNIT: 1.25 MG | 55 days supply | Qty: 8 | Fill #2

## 2019-10-04 ENCOUNTER — Ambulatory Visit (INDEPENDENT_AMBULATORY_CARE_PROVIDER_SITE_OTHER): Payer: Medicaid Other | Admitting: Licensed Clinical Social Worker

## 2019-10-04 ENCOUNTER — Encounter (HOSPITAL_COMMUNITY): Payer: Self-pay | Admitting: Licensed Clinical Social Worker

## 2019-10-04 ENCOUNTER — Other Ambulatory Visit: Payer: Self-pay

## 2019-10-04 DIAGNOSIS — F331 Major depressive disorder, recurrent, moderate: Secondary | ICD-10-CM | POA: Diagnosis not present

## 2019-10-05 MED FILL — MIRTAZAPINE 30 MG TABLET: 30 | 90 days supply | Qty: 90 | Fill #1

## 2019-10-05 NOTE — Progress Notes (Signed)
Virtual Visit via Telephone Note  I connected with [name] on 10/04/2019 at 10:00 AM EDT by phone enabled telehealth application and verified that I am speaking with the correct person using two identifiers. Patient does not have a computer or the Internet.   I discussed the limitations of evaluation and management by telemedicine and the availability of in person appointments. The patient expressed understanding and agreed to proceed.  LOCATION:  Patient: Home Provider: Home  History of Present Illness: Pt was referred for OP therapy by her neurologist Dr. [place] Lions for depression.   Observations/Objective: Verbalize hopeful and positive statements regarding the future. Pt presented WNL for her individual therapy session. Pt described her psychiatric symptoms and current life events. Patient described her moods being more stable, due to her daughter's moods being more stable. Patient reports she continues to walk every morning even in the heat. Cautioned patient on the importance of hydration during walking in the heat. Patient reports she and her daughter are getting along better and she is helping with the grandchildren more. Cautioned patient on taking care of herself first. She reports she's had 1 seizure within the last 2 weeks. Asked open ended questions.           Assessment and plan: Counselor will continue to meet with patient to address treatment plan goals. Patient will continue to follow recommendations of providers and implement skills learned in session.  Follow Up Instructions:   I discussed the assessment and treatment plan with the patient. The patient was provided an opportunity to ask questions and all were answered. The patient agreed with the plan and demonstrated an understanding of the instructions.   The patient was advised to call back or seek an in-person evaluation if the symptoms worsen or if the condition fails to improve as anticipated.  I provided 45  minutes of non-face-to-face time during this encounter.   Sebastian Lurz S, LCAS

## 2019-10-18 ENCOUNTER — Encounter (HOSPITAL_COMMUNITY): Payer: Self-pay | Admitting: Licensed Clinical Social Worker

## 2019-10-18 ENCOUNTER — Ambulatory Visit (INDEPENDENT_AMBULATORY_CARE_PROVIDER_SITE_OTHER): Payer: Medicaid Other | Admitting: Licensed Clinical Social Worker

## 2019-10-18 ENCOUNTER — Other Ambulatory Visit: Payer: Self-pay

## 2019-10-18 DIAGNOSIS — F331 Major depressive disorder, recurrent, moderate: Secondary | ICD-10-CM | POA: Diagnosis not present

## 2019-10-19 NOTE — Progress Notes (Signed)
Virtual Visit via Telephone Note  I connected with [name] on 10/18/2019 at 10:00 AM EDT by phone enabled telehealth application and verified that I am speaking with the correct person using two identifiers. Patient does not have a computer or the Internet.   I discussed the limitations of evaluation and management by telemedicine and the availability of in person appointments. The patient expressed understanding and agreed to proceed.  LOCATION:  Patient: Home Provider: Home  History of Present Illness: Pt was referred for OP therapy by her neurologist Dr. [place] Lions for depression.   Observations/Objective: Verbalize hopeful and positive statements regarding the future. Pt presented WNL for her individual therapy session. Pt described her psychiatric symptoms and current life events. Patient described her moods being less stable, due to her daughter's moods being less stable. Patient reports she only goes over to her daughter's house to help with her grandchildren. However, patient feels trapped when she goes over. Provided patient suggestions on seeing her grandchildren and how not to become embroiled in an argument with her daughter. Role played how to explain her expectations to her daughter, not letting her daughter be in control.  Patient shared she had 1 seizure within the last week due to her daughter's moods.             Assessment and plan: Counselor will continue to meet with patient to address treatment plan goals. Patient will continue to follow recommendations of providers and implement skills learned in session.  Follow Up Instructions:   I discussed the assessment and treatment plan with the patient. The patient was provided an opportunity to ask questions and all were answered. The patient agreed with the plan and demonstrated an understanding of the instructions.   The patient was advised to call back or seek an in-person evaluation if the symptoms worsen or if  the condition fails to improve as anticipated.  I provided 45 minutes of non-face-to-face time during this encounter.   Simmie Garin S, LCAS

## 2019-10-30 ENCOUNTER — Other Ambulatory Visit: Payer: Self-pay

## 2019-10-30 ENCOUNTER — Encounter (HOSPITAL_COMMUNITY): Payer: Self-pay | Admitting: Licensed Clinical Social Worker

## 2019-10-30 ENCOUNTER — Ambulatory Visit (INDEPENDENT_AMBULATORY_CARE_PROVIDER_SITE_OTHER): Payer: Medicaid Other | Admitting: Licensed Clinical Social Worker

## 2019-10-30 DIAGNOSIS — F331 Major depressive disorder, recurrent, moderate: Secondary | ICD-10-CM | POA: Diagnosis not present

## 2019-10-30 NOTE — Progress Notes (Signed)
Virtual Visit via Telephone Note  I connected with [name] on 10/30/2019 at 10:00 AM EDT by phone enabled telehealth application and verified that I am speaking with the correct person using two identifiers. Patient does not have a computer or the Internet.   I discussed the limitations of evaluation and management by telemedicine and the availability of in person appointments. The patient expressed understanding and agreed to proceed.  LOCATION:  Patient: Home Provider: Home  History of Present Illness: Pt was referred for OP therapy by her neurologist Dr. [place] Lions for depression.   Observations/Objective: Verbalize hopeful and positive statements regarding the future. Pt presented anxious for her individual therapy session. Pt described her psychiatric symptoms and current life events. Patient described her moods being less stable, due to her daughter's moods being less stable. Patient described her daughter's uncontrollable behavior, which affects her mental health. Explained to patient the benefits of using boundaries, even with family members. Role-played boundaries with patient. Patient shared she had 1 seizure within the last week due to her daughter's moods. Patient keeps a journal of her seizures and what triggers them. Reviewed journal with patient.   Assessment and plan: Counselor will continue to meet with patient to address treatment plan goals. Patient will continue to follow recommendations of providers and implement skills learned in session.  Follow Up Instructions:   I discussed the assessment and treatment plan with the patient. The patient was provided an opportunity to ask questions and all were answered. The patient agreed with the plan and demonstrated an understanding of the instructions.   The patient was advised to call back or seek an in-person evaluation if the symptoms worsen or if the condition fails to improve as anticipated.  I provided 45 minutes of  non-face-to-face time during this encounter.   Elara Cocke S, LCAS

## 2019-11-06 ENCOUNTER — Other Ambulatory Visit: Payer: Self-pay

## 2019-11-06 ENCOUNTER — Ambulatory Visit (INDEPENDENT_AMBULATORY_CARE_PROVIDER_SITE_OTHER): Payer: Medicaid Other | Admitting: Licensed Clinical Social Worker

## 2019-11-06 ENCOUNTER — Encounter (HOSPITAL_COMMUNITY): Payer: Self-pay | Admitting: Licensed Clinical Social Worker

## 2019-11-06 DIAGNOSIS — F331 Major depressive disorder, recurrent, moderate: Secondary | ICD-10-CM

## 2019-11-06 NOTE — Progress Notes (Signed)
Virtual Visit via Telephone Note  I connected with [name] on 11/06/2019 at 10:00 AM EDT by phone enabled telehealth application and verified that I am speaking with the correct person using two identifiers. Patient does not have a computer or the Internet.   I discussed the limitations of evaluation and management by telemedicine and the availability of in person appointments. The patient expressed understanding and agreed to proceed.  LOCATION:  Patient: Home Provider: Home  History of Present Illness: Pt was referred for OP therapy by her neurologist Dr. [place] Lions for depression.   Observations/Objective: Verbalize hopeful and positive statements regarding the future. Pt presented anxious for her individual therapy session. Pt described her psychiatric symptoms and current life events.  Patient has changed her phone # so she has had no contact with the toxic people in her life. However, she now has no contact with her grandchildren, which makes her sad. Discussed alternatives to seeing/calling grandchildren. Patient reports, "I understand the toxic relationships in my life cause me to have seizures." Patient reports I've had no seizures in the last 2 weeks due to no family stressors. Patient continues to keep a seizure journal. Reviewed journal with patient pointing out the stressors that causes her seizures.    Assessment and plan: Counselor will continue to meet with patient to address treatment plan goals. Patient will continue to follow recommendations of providers and implement skills learned in session.  Follow Up Instructions:   I discussed the assessment and treatment plan with the patient. The patient was provided an opportunity to ask questions and all were answered. The patient agreed with the plan and demonstrated an understanding of the instructions.   The patient was advised to call back or seek an in-person evaluation if the symptoms worsen or if the condition fails to  improve as anticipated.  I provided 45 minutes of non-face-to-face time during this encounter.   [name] S, LCAS

## 2019-11-20 ENCOUNTER — Telehealth (HOSPITAL_COMMUNITY): Payer: Self-pay | Admitting: Licensed Clinical Social Worker

## 2019-11-20 ENCOUNTER — Ambulatory Visit (HOSPITAL_COMMUNITY): Payer: Medicaid Other | Admitting: Licensed Clinical Social Worker

## 2019-11-20 ENCOUNTER — Other Ambulatory Visit: Payer: Self-pay

## 2019-11-20 NOTE — Telephone Encounter (Signed)
Cln called pt for her individual session. Her # has been disconnected. Cln called her sister and left a vm msg inquiring about patient. Dakari Cregger, LCAS

## 2019-11-29 ENCOUNTER — Encounter (HOSPITAL_COMMUNITY): Payer: Self-pay | Admitting: Licensed Clinical Social Worker

## 2019-11-29 ENCOUNTER — Ambulatory Visit (INDEPENDENT_AMBULATORY_CARE_PROVIDER_SITE_OTHER): Payer: Medicaid Other | Admitting: Licensed Clinical Social Worker

## 2019-11-29 ENCOUNTER — Other Ambulatory Visit: Payer: Self-pay

## 2019-11-29 DIAGNOSIS — F331 Major depressive disorder, recurrent, moderate: Secondary | ICD-10-CM | POA: Diagnosis not present
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
## 2019-12-04 ENCOUNTER — Ambulatory Visit (INDEPENDENT_AMBULATORY_CARE_PROVIDER_SITE_OTHER): Payer: Medicaid Other | Admitting: Licensed Clinical Social Worker

## 2019-12-04 ENCOUNTER — Encounter (HOSPITAL_COMMUNITY): Payer: Self-pay | Admitting: Licensed Clinical Social Worker

## 2019-12-04 ENCOUNTER — Other Ambulatory Visit: Payer: Self-pay

## 2019-12-04 DIAGNOSIS — F331 Major depressive disorder, recurrent, moderate: Secondary | ICD-10-CM | POA: Diagnosis not present

## 2019-12-04 NOTE — Progress Notes (Signed)
Virtual Visit via Telephone Note  I connected with [name] on 12/04/2019 at 10:00 AM EST by phone enabled telehealth application and verified that I am speaking with the correct person using two identifiers. Patient does not have a computer or the Internet.   I discussed the limitations of evaluation and management by telemedicine and the availability of in person appointments. The patient expressed understanding and agreed to proceed.  LOCATION:  Patient: Home Provider: Home Office  History of Present Illness: Pt was referred for OP therapy by her neurologist Dr. [place] Lions for depression.   Observations/Objective: Verbalize hopeful and positive statements regarding the future. Pt presented anxious for her individual therapy session. Pt described her psychiatric symptoms and current life events. Patient reports she continues her daily exercise routine. Patient reports she has minimal contact with her daughter so she has had minimal stressors and no seizures. Patient discussed her desire to move to [place] to be near her sister & brother-in-law. Cln assisted patient problem solving and using thought-provoking questions to help make a plan for a move, including pros and cons.   Assessment and plan: Counselor will continue to meet with patient to address treatment plan goals. Patient will continue to follow recommendations of providers and implement skills learned in session.  Follow Up Instructions:   I discussed the assessment and treatment plan with the patient. The patient was provided an opportunity to ask questions and all were answered. The patient agreed with the plan and demonstrated an understanding of the instructions.   The patient was advised to call back or seek an in-person evaluation if the symptoms worsen or if the condition fails to improve as anticipated.  I provided 45 minutes of non-face-to-face time during this encounter.   Jillian Warth S, LCAS

## 2019-12-11 ENCOUNTER — Ambulatory Visit (INDEPENDENT_AMBULATORY_CARE_PROVIDER_SITE_OTHER): Payer: Medicaid Other | Admitting: Licensed Clinical Social Worker

## 2019-12-11 ENCOUNTER — Other Ambulatory Visit: Payer: Self-pay

## 2019-12-11 ENCOUNTER — Encounter (HOSPITAL_COMMUNITY): Payer: Self-pay | Admitting: Licensed Clinical Social Worker

## 2019-12-11 DIAGNOSIS — F331 Major depressive disorder, recurrent, moderate: Secondary | ICD-10-CM

## 2019-12-11 NOTE — Progress Notes (Signed)
Virtual Visit via Telephone Note  I connected with [name] on 12/11/2019 at 10:00 AM EST by phone enabled telehealth application and verified that I am speaking with the correct person using two identifiers. Patient does not have a computer or the Internet.   I discussed the limitations of evaluation and management by telemedicine and the availability of in person appointments. The patient expressed understanding and agreed to proceed.  LOCATION:  Patient: Home Provider: Home Office  History of Present Illness: Pt was referred for OP therapy by her neurologist Dr. [place] Lions for depression.   Observations/Objective: Verbalize hopeful and positive statements regarding the future. Pt presented anxious for her individual therapy session. Pt described her psychiatric symptoms and current life events. Patient reports she continues her daily exercise routine, walking every morning.  Patient has PCP appointment in December for yearly check up. Patient reports she has minimal contact with her daughter so she has had minimal stressors and no seizures. Thanksgiving holiday is this week, role-played boundaries on possible scenarios. Patient practiced in session.   Assessment and plan: Counselor will continue to meet with patient to address treatment plan goals. Patient will continue to follow recommendations of providers and implement skills learned in session.  Follow Up Instructions:   I discussed the assessment and treatment plan with the patient. The patient was provided an opportunity to ask questions and all were answered. The patient agreed with the plan and demonstrated an understanding of the instructions.   The patient was advised to call back or seek an in-person evaluation if the symptoms worsen or if the condition fails to improve as anticipated.  I provided 60 minutes of non-face-to-face time during this encounter.   Ildefonso Keaney S, LCAS

## 2019-12-18 ENCOUNTER — Ambulatory Visit (INDEPENDENT_AMBULATORY_CARE_PROVIDER_SITE_OTHER): Payer: Medicaid Other | Admitting: Licensed Clinical Social Worker

## 2019-12-18 ENCOUNTER — Encounter (HOSPITAL_COMMUNITY): Payer: Self-pay | Admitting: Licensed Clinical Social Worker

## 2019-12-18 ENCOUNTER — Other Ambulatory Visit: Payer: Self-pay

## 2019-12-18 DIAGNOSIS — F331 Major depressive disorder, recurrent, moderate: Secondary | ICD-10-CM

## 2019-12-18 NOTE — Progress Notes (Signed)
Virtual Visit via Telephone Note  I connected with [name] on 12/18/2019 at 10:00 AM EST by phone enabled telehealth application and verified that I am speaking with the correct person using two identifiers. Patient does not have a computer or the Internet.   I discussed the limitations of evaluation and management by telemedicine and the availability of in person appointments. The patient expressed understanding and agreed to proceed.  LOCATION:  Patient: Home Provider: Home Office  History of Present Illness: Pt was referred for OP therapy by her neurologist Dr. [place] Lions for depression.   Observations/Objective: Verbalize hopeful and positive statements regarding the future. Pt presented anxious for her individual therapy session. Pt described her psychiatric symptoms and current life events. Reviewed tx plan with patient who verbalized acceptance of the plan. Patient reports she continues her daily exercise routine, walking every morning.  Instructed patient to prepare for safety in her early morning walks. Patient described her Thanksgiving holiday family drama where she reports she used her coping skills learned in session (breathing). Practiced breathing technique in session as patient became quite anxious describing her holiday. Encouraged patient to continue practicing her breathing techniques between sessions.      Assessment and plan: Counselor will continue to meet with patient to address treatment plan goals. Patient will continue to follow recommendations of providers and implement skills learned in session.  Follow Up Instructions:   I discussed the assessment and treatment plan with the patient. The patient was provided an opportunity to ask questions and all were answered. The patient agreed with the plan and demonstrated an understanding of the instructions.   The patient was advised to call back or seek an in-person evaluation if the symptoms worsen or if the condition  fails to improve as anticipated.  I provided 60 minutes of non-face-to-face time during this encounter.   Siyah Mault S, LCAS

## 2019-12-19 ENCOUNTER — Other Ambulatory Visit: Payer: Self-pay | Admitting: Internal Medicine

## 2019-12-19 DIAGNOSIS — I10 Essential (primary) hypertension: Secondary | ICD-10-CM

## 2019-12-19 DIAGNOSIS — R7303 Prediabetes: Secondary | ICD-10-CM

## 2019-12-19 MED FILL — LORATADINE 10 MG TABLET: 10 | 30 days supply | Qty: 30 | Fill #1

## 2019-12-19 MED FILL — HYDROCHLOROTHIAZIDE 25 MG T: 25 | 90 days supply | Qty: 90 | Fill #2

## 2019-12-19 MED FILL — LOSARTAN POTASSIUM 50 MG TA: 50 | 90 days supply | Qty: 90 | Fill #1

## 2019-12-19 MED FILL — MIRTAZAPINE 30 MG TABLET: 30 | 90 days supply | Qty: 90 | Fill #0

## 2019-12-19 MED FILL — LEVETIRACETAM ER 500 MG TAB: 500 | 90 days supply | Qty: 270 | Fill #1

## 2019-12-19 MED FILL — AMLODIPINE BESYLATE 10 MG T: 10 | 90 days supply | Qty: 90 | Fill #0

## 2019-12-19 MED FILL — METFORMIN HCL 500 MG TABS: 500 | 30 days supply | Qty: 60 | Fill #0

## 2019-12-25 ENCOUNTER — Encounter (HOSPITAL_COMMUNITY): Payer: Self-pay | Admitting: Licensed Clinical Social Worker

## 2019-12-25 ENCOUNTER — Other Ambulatory Visit: Payer: Self-pay

## 2019-12-25 ENCOUNTER — Ambulatory Visit (INDEPENDENT_AMBULATORY_CARE_PROVIDER_SITE_OTHER): Payer: Medicaid Other | Admitting: Licensed Clinical Social Worker

## 2019-12-25 ENCOUNTER — Other Ambulatory Visit: Payer: Self-pay | Admitting: Internal Medicine

## 2019-12-25 DIAGNOSIS — Z1231 Encounter for screening mammogram for malignant neoplasm of breast: Secondary | ICD-10-CM

## 2019-12-25 DIAGNOSIS — F331 Major depressive disorder, recurrent, moderate: Secondary | ICD-10-CM | POA: Diagnosis not present

## 2019-12-25 NOTE — Progress Notes (Signed)
Virtual Visit via Telephone Note  I connected with [name] on 12/18/2019 at 10:00 AM EST by phone enabled telehealth application and verified that I am speaking with the correct person using two identifiers. Patient does not have a computer or the Internet.   I discussed the limitations of evaluation and management by telemedicine and the availability of in person appointments. The patient expressed understanding and agreed to proceed.  LOCATION:  Patient: Home Provider: Home Office  History of Present Illness: Pt was referred for OP therapy by her neurologist Dr. [place] Lions for depression.   Observations/Objective: Verbalize hopeful and positive statements regarding the future. Pt presented anxious for her individual therapy session. Pt described her psychiatric symptoms and current life events.  Patient reports she continues her daily exercise routine, walking every morning.  Instructed patient to prepare for safety in her early morning walks.  Pt reports she has not talked to her daughter or grandchildren since thanksgiving and she's unsure why. Role-played with pt how to talk to her daughter, using boundaries.  Patient is concerned about her apartment, "I have an appointment this afternoon with [employer], to discuss my income." Clarified with patient questions that may come up, during meeting. Suggested patient contact her sister for direction. Pt was in agreement with suggestion.   Assessment and plan: Counselor will continue to meet with patient to address treatment plan goals. Patient will continue to follow recommendations of providers and implement skills learned in session.  Follow Up Instructions:   I discussed the assessment and treatment plan with the patient. The patient was provided an opportunity to ask questions and all were answered. The patient agreed with the plan and demonstrated an understanding of the instructions.   The patient was advised to call back or seek an  in-person evaluation if the symptoms worsen or if the condition fails to improve as anticipated.  I provided 60 minutes of non-face-to-face time during this encounter.   Krissa Utke S, LCAS

## 2019-12-26 ENCOUNTER — Other Ambulatory Visit: Payer: Self-pay | Admitting: Internal Medicine

## 2019-12-26 ENCOUNTER — Encounter: Payer: Self-pay | Admitting: Internal Medicine

## 2019-12-26 ENCOUNTER — Ambulatory Visit: Payer: Medicaid Other | Attending: Internal Medicine | Admitting: Internal Medicine

## 2019-12-26 ENCOUNTER — Other Ambulatory Visit: Payer: Self-pay

## 2019-12-26 VITALS — BP 110/70 | HR 71 | Temp 95.9°F | Resp 16 | Wt 224.4 lb

## 2019-12-26 DIAGNOSIS — R002 Palpitations: Secondary | ICD-10-CM | POA: Diagnosis not present

## 2019-12-26 DIAGNOSIS — Z6841 Body Mass Index (BMI) 40.0 and over, adult: Secondary | ICD-10-CM | POA: Diagnosis not present

## 2019-12-26 DIAGNOSIS — R7303 Prediabetes: Secondary | ICD-10-CM

## 2019-12-26 DIAGNOSIS — Z2821 Immunization not carried out because of patient refusal: Secondary | ICD-10-CM | POA: Diagnosis not present

## 2019-12-26 DIAGNOSIS — E559 Vitamin D deficiency, unspecified: Secondary | ICD-10-CM

## 2019-12-26 DIAGNOSIS — I10 Essential (primary) hypertension: Secondary | ICD-10-CM | POA: Diagnosis not present

## 2019-12-26 LAB — POCT GLYCOSYLATED HEMOGLOBIN (HGB A1C): HbA1c, POC (prediabetic range): 5.9 % (ref 5.7–6.4)

## 2019-12-26 LAB — GLUCOSE, POCT (MANUAL RESULT ENTRY): POC Glucose: 94 mg/dl (ref 70–99)

## 2019-12-26 MED ORDER — HYDROCHLOROTHIAZIDE 25 MG PO TABS: 25.0000 mg | ORAL_TABLET | Freq: Every day | ORAL | 3 refills | Status: DC

## 2019-12-26 MED ORDER — VITAMIN D (ERGOCALCIFEROL) 1.25 MG (50000 UNIT) PO CAPS: 50000.0000 [IU] | ORAL_CAPSULE | ORAL | 1 refills | Status: DC

## 2019-12-26 MED ORDER — AMLODIPINE BESYLATE 10 MG PO TABS: 10.0000 mg | ORAL_TABLET | Freq: Every day | ORAL | 1 refills | Status: DC

## 2019-12-26 MED FILL — VIT D2 1.25 MG (50,000 UNIT: 1.25 MG | 28 days supply | Qty: 4 | Fill #0

## 2019-12-26 NOTE — Patient Instructions (Addendum)
Fall Prevention in the Home, Adult Falls can cause injuries. They can happen to people of all ages. There are many things you can do to make your home safe and to help prevent falls. Ask for help when making these changes, if needed. What actions can I take to prevent falls? General Instructions  Use good lighting in all rooms. Replace any light bulbs that burn out.  Turn on the lights when you go into a dark area. Use night-lights.  Keep items that you use often in easy-to-reach places. Lower the shelves around your home if necessary.  Set up your furniture so you have a clear path. Avoid moving your furniture around.  Do not have throw rugs and other things on the floor that can make you trip.  Avoid walking on wet floors.  If any of your floors are uneven, fix them.  Add color or contrast paint or tape to clearly mark and help you see: ? Any grab bars or handrails. ? First and last steps of stairways. ? Where the edge of each step is.  If you use a stepladder: ? Make sure that it is fully opened. Do not climb a closed stepladder. ? Make sure that both sides of the stepladder are locked into place. ? Ask someone to hold the stepladder for you while you use it.  If there are any pets around you, be aware of where they are. What can I do in the bathroom?      Keep the floor dry. Clean up any water that spills onto the floor as soon as it happens.  Remove soap buildup in the tub or shower regularly.  Use non-skid mats or decals on the floor of the tub or shower.  Attach bath mats securely with double-sided, non-slip rug tape.  If you need to sit down in the shower, use a plastic, non-slip stool.  Install grab bars by the toilet and in the tub and shower. Do not use towel bars as grab bars. What can I do in the bedroom?  Make sure that you have a light by your bed that is easy to reach.  Do not use any sheets or blankets that are too big for your bed. They should not  hang down onto the floor.  Have a firm chair that has side arms. You can use this for support while you get dressed. What can I do in the kitchen?  Clean up any spills right away.  If you need to reach something above you, use a strong step stool that has a grab bar.  Keep electrical cords out of the way.  Do not use floor polish or wax that makes floors slippery. If you must use wax, use non-skid floor wax. What can I do with my stairs?  Do not leave any items on the stairs.  Make sure that you have a light switch at the top of the stairs and the bottom of the stairs. If you do not have them, ask someone to add them for you.  Make sure that there are handrails on both sides of the stairs, and use them. Fix handrails that are broken or loose. Make sure that handrails are as long as the stairways.  Install non-slip stair treads on all stairs in your home.  Avoid having throw rugs at the top or bottom of the stairs. If you do have throw rugs, attach them to the floor with carpet tape.  Choose a carpet that does   not hide the edge of the steps on the stairway.  Check any carpeting to make sure that it is firmly attached to the stairs. Fix any carpet that is loose or worn. What can I do on the outside of my home?  Use bright outdoor lighting.  Regularly fix the edges of walkways and driveways and fix any cracks.  Remove anything that might make you trip as you walk through a door, such as a raised step or threshold.  Trim any bushes or trees on the path to your home.  Regularly check to see if handrails are loose or broken. Make sure that both sides of any steps have handrails.  Install guardrails along the edges of any raised decks and porches.  Clear walking paths of anything that might make someone trip, such as tools or rocks.  Have any leaves, snow, or ice cleared regularly.  Use sand or salt on walking paths during winter.  Clean up any spills in your garage right  away. This includes grease or oil spills. What other actions can I take?  Wear shoes that: ? Have a low heel. Do not wear high heels. ? Have rubber bottoms. ? Are comfortable and fit you well. ? Are closed at the toe. Do not wear open-toe sandals.  Use tools that help you move around (mobility aids) if they are needed. These include: ? Canes. ? Walkers. ? Scooters. ? Crutches.  Review your medicines with your doctor. Some medicines can make you feel dizzy. This can increase your chance of falling. Ask your doctor what other things you can do to help prevent falls. Where to find more information  Centers for Disease Control and Prevention, [facility]AD[facility]: HealthcareCounselor.com.pt  [employer] on Aging: RingConnections.si Contact a doctor if:  You are afraid of falling at home.  You feel weak, drowsy, or dizzy at home.  You fall at home. Summary  There are many simple things that you can do to make your home safe and to help prevent falls.  Ways to make your home safe include removing tripping hazards and installing grab bars in the bathroom.  Ask for help when making these changes in your home. This information is not intended to replace advice given to you by your health care provider. Make sure you discuss any questions you have with your health care provider. Document Revised: 04/28/2018 Document Reviewed: 08/20/2016 Elsevier Patient Education  2020 Elsevier Inc.  Obesity, Adult Obesity is having too much body fat. Being obese means that your weight is more than what is healthy for you. BMI is a number that explains how much body fat you have. If you have a BMI of 30 or more, you are obese. Obesity is often caused by eating or drinking more calories than your body uses. Changing your lifestyle can help you lose weight. Obesity can cause serious health problems, such as:  Stroke.  Coronary artery disease (CAD).  Type 2 diabetes.  Some types of cancer, including  cancers of the colon, breast, uterus, and gallbladder.  Osteoarthritis.  High blood pressure (hypertension).  High cholesterol.  Sleep apnea.  Gallbladder stones.  Infertility problems. What are the causes?  Eating meals each day that are high in calories, sugar, and fat.  Being born with genes that may make you more likely to become obese.  Having a medical condition that causes obesity.  Taking certain medicines.  Sitting a lot (having a sedentary lifestyle).  Not getting enough sleep.  Drinking a lot  of drinks that have sugar in them. What increases the risk?  Having a family history of obesity.  Being an [place] American woman.  Being a Hispanic man.  Living in an area with limited access to: ? [facility], recreation centers, or sidewalks. ? Healthy food choices, such as grocery stores and farmers' markets. What are the signs or symptoms? The main sign is having too much body fat. How is this treated?  Treatment for this condition often includes changing your lifestyle. Treatment may include: ? Changing your diet. This may include making a healthy meal plan. ? Exercise. This may include activity that causes your heart to beat faster (aerobic exercise) and strength training. Work with your doctor to design a program that works for you. ? Medicine to help you lose weight. This may be used if you are not able to lose 1 pound a week after 6 weeks of healthy eating and more exercise. ? Treating conditions that cause the obesity. ? Surgery. Options may include gastric banding and gastric bypass. This may be done if:  Other treatments have not helped to improve your condition.  You have a BMI of 40 or higher.  You have life-threatening health problems related to obesity. Follow these instructions at home: Eating and drinking   Follow advice from your doctor about what to eat and drink. Your doctor may tell you to: ? Limit fast food, sweets, and processed snack  foods. ? Choose low-fat options. For example, choose low-fat milk instead of whole milk. ? Eat 5 or more servings of fruits or vegetables each day. ? Eat at home more often. This gives you more control over what you eat. ? Choose healthy foods when you eat out. ? Learn to read food labels. This will help you learn how much food is in 1 serving. ? Keep low-fat snacks available. ? Avoid drinks that have a lot of sugar in them. These include soda, fruit juice, iced tea with sugar, and flavored milk.  Drink enough water to keep your pee (urine) pale yellow.  Do not go on fad diets. Physical activity  Exercise often, as told by your doctor. Most adults should get up to 150 minutes of moderate-intensity exercise every week.Ask your doctor: ? What types of exercise are safe for you. ? How often you should exercise.  Warm up and stretch before being active.  Do slow stretching after being active (cool down).  Rest between times of being active. Lifestyle  Work with your doctor and a food expert (dietitian) to set a weight-loss goal that is best for you.  Limit your screen time.  Find ways to reward yourself that do not involve food.  Do not drink alcohol if: ? Your doctor tells you not to drink. ? You are pregnant, may be pregnant, or are planning to become pregnant.  If you drink alcohol: ? Limit how much you use to:  0-1 drink a day for women.  0-2 drinks a day for men. ? Be aware of how much alcohol is in your drink. In the [place] one drink equals one 12 oz bottle of beer (355 mL), one 5 oz glass of wine (148 mL), or one 1 oz glass of hard liquor (44 mL). General instructions  Keep a weight-loss journal. This can help you keep track of: ? The food that you eat. ? How much exercise you get.  Take over-the-counter and prescription medicines only as told by your doctor.  Take vitamins and supplements only  as told by your doctor.  Think about joining a support  group.  Keep all follow-up visits as told by your doctor. This is important. Contact a doctor if:  You cannot meet your weight loss goal after you have changed your diet and lifestyle for 6 weeks. Get help right away if you:  Are having trouble breathing.  Are having thoughts of harming yourself. Summary  Obesity is having too much body fat.  Being obese means that your weight is more than what is healthy for you.  Work with your doctor to set a weight-loss goal.  Get regular exercise as told by your doctor. This information is not intended to replace advice given to you by your health care provider. Make sure you discuss any questions you have with your health care provider. Document Revised: 09/09/2017 Document Reviewed: 09/09/2017 Elsevier Patient Education  2020 [employer].

## 2019-12-26 NOTE — Progress Notes (Signed)
Patient ID: [name], female    DOB: [birth year]  MRN: [id number]  CC: Hypertension and Prediabetes   Subjective: [name] is a 64 y.o. female who presents for chronic ds management Her concerns today include:  Pt with hx of depression, HTN, Sz dsand pseudoSZ, pre-DM, trichotillomania, internal hemorrhoids, seasonal allergies.  HYPERTENSION Currently taking: see medication list.  She is on amlodipine, hydrochlorothiazide and Cozaar Med Adherence: [x]  Yes and took already for the morning Medication side effects: []  Yes    [x]  No Adherence with salt restriction: [x]  Yes    Home Monitoring?: []  Yes    [x]  No but has a device Monitoring Frequency: []  Yes    []  No Home BP results range: []  Yes    []  No SOB? []  Yes    [x]  No Chest Pain?: []  Yes    [x]  No but was having some palpitations for 1 wk the end of last mth.  Reports some increase family stress around that time  Leg swelling?: []  Yes    [x]  No Headaches?: []  Yes    [x]  No Dizziness? []  Yes    [x]  No Comments:   PreDM/Obesity: Results for orders placed or performed in visit on 12/26/19  POCT glucose (manual entry)  Result Value Ref Range   POC Glucose 94 70 - 99 mg/dl  POCT glycosylated hemoglobin (Hb A1C)  Result Value Ref Range   Hemoglobin A1C     HbA1c POC (<> result, manual entry)     HbA1c, POC (prediabetic range) 5.9 5.7 - 6.4 %   HbA1c, POC (controlled diabetic range)     Gained about 13 lbs June of this yr.  Reports that she is an emotional eater.  Has continues ongoing family stress with her daughter .  Feels her daughter imposes on her to baby sit her kids.  When patient is stressed, she overeats.  She attributes this to her weight gain.  Not getting in much exercise.  She continues to see her therapist regularly. Compliant with Metformin  Vit D def:  Out of refills of vitamin D supplement  HM:  Still has not receive COVID -19 vaccine,  Declines flu shot   Patient Active Problem List    Diagnosis Date Noted  . Influenza vaccine refused 12/26/2019  . Difficulty articulating words 03/07/2019  . Adjustment disorder with anxious mood 08/18/2018  . Obesity (BMI 30-39.9) 09/17/2016  . Internal hemorrhoid 09/16/2016  . Adjustment disorder with mixed anxiety and depressed mood 12/04/2015  . Primary insomnia 12/04/2015  . Prediabetes 10/09/2013  . Facial droop 10/09/2013  . Seizures (HCC) 08/24/2013  . Obesity, unspecified 10/17/2007  . 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 PES PLANUS, RIGHT 07/20/2007  . DYSTONIA 01/04/2007  . Major depressive disorder, recurrent episode, moderate (HCC) 03/18/2006  . Essential hypertension 03/18/2006  . PSORIASIS 03/18/2006     Current Outpatient Medications on File Prior to Visit  Medication Sig Dispense Refill  . aspirin EC 81 MG tablet Take 1 tablet (81 mg total) by mouth daily. 100 tablet 1  . Blood Pressure Monitor DEVI Use as directed to check home blood pressure 2-3 times a week 1 Device 0  . levETIRAcetam (KEPPRA XR) 500 MG 24 hr tablet Take 1 tablet three times a day 270 tablet 3  . loratadine (CLARITIN) 10 MG tablet TAKE 1 TABLET (10 MG TOTAL) BY MOUTH DAILY. 60 tablet 2  . losartan (COZAAR) 50 MG tablet Take 1 tablet (50 mg total) by mouth daily.  90 tablet 3  . metFORMIN (GLUCOPHAGE) 500 MG tablet TAKE 1 TABLET (500 MG TOTAL) BY MOUTH DAILY WITH BREAKFAST. 60 tablet 2  . mirtazapine (REMERON) 30 MG tablet Take 1 tablet (30 mg total) by mouth at bedtime. 90 tablet 1   No current facility-administered medications on file prior to visit.    Allergies  Allergen Reactions  . Codeine Anaphylaxis  . Shellfish Allergy Swelling and Rash    Throat Sweling  . Apple Cider Vinegar     syncope  . Cinnamon Hives  . Lisinopril     cough  . Opium     Interferes with Keppra  . Banana Rash    Cannot eat any while taking KEPPRA  . Catfish [Fish Allergy] Rash  . Orange Fruit [Citrus] Rash    Cannot have while taking KEPPRA  . Tomato  Itching and Rash    Social History   Socioeconomic History  . Marital status: Divorced    Spouse name: Not on file  . Number of children: 1  . Years of education: Not on file  . Highest education level: Not on file  Occupational History  . Not on file  Tobacco Use  . Smoking status: Never Smoker  . Smokeless tobacco: Never Used  Vaping Use  . Vaping Use: Never used  Substance and Sexual Activity  . Alcohol use: No    Alcohol/week: 0.0 standard drinks  . Drug use: No  . Sexual activity: Never  Other Topics Concern  . Not on file  Social History Narrative   Right handed    Lives alone    Lives in appartment   Social Determinants of Health   Financial Resource Strain:   . Difficulty of Paying Living Expenses: Not on file  Food Insecurity:   . Worried About Programme researcher, broadcasting/film/video in the Last Year: Not on file  . Ran Out of Food in the Last Year: Not on file  Transportation Needs:   . Lack of Transportation (Medical): Not on file  . Lack of Transportation (Non-Medical): Not on file  Physical Activity:   . Days of Exercise per Week: Not on file  . Minutes of Exercise per Session: Not on file  Stress:   . Feeling of Stress : Not on file  Social Connections:   . Frequency of Communication with Friends and Family: Not on file  . Frequency of Social Gatherings with Friends and Family: Not on file  . Attends Religious Services: Not on file  . Active Member of Clubs or Organizations: Not on file  . Attends Banker Meetings: Not on file  . Marital Status: Not on file  Intimate Partner Violence:   . Fear of Current or Ex-Partner: Not on file  . Emotionally Abused: Not on file  . Physically Abused: Not on file  . Sexually Abused: Not on file    Family History  Problem Relation Age of Onset  . Asthma Mother   . Hypertension Father   . Heart disease Father   . Stroke Father   . Cancer Sister        uterus cancer   . Diabetes Maternal Uncle   . Breast  cancer Cousin   . Colon cancer Neg Hx     Past Surgical History:  Procedure Laterality Date  . ABDOMINAL HYSTERECTOMY    . COLONOSCOPY N/A 07/15/2015   Dr. Darrick Penna: small-mouth diverticula, small non-bleeding internal hemorrhoids, redundant colon   . HERNIA REPAIR    .  MYOMECTOMY      ROS: Review of Systems Negative except as stated above  PHYSICAL EXAM: BP 110/70   Pulse 71   Temp (!) 95.9 F (35.5 C)   Resp 16   Wt 224 lb 6.4 oz (101.8 kg)   SpO2 95%   BMI 41.04 kg/m   Wt Readings from Last 3 Encounters:  12/26/19 224 lb 6.4 oz (101.8 kg)  06/21/19 211 lb (95.7 kg)  05/02/19 212 lb (96.2 kg)    Physical Exam  General appearance - alert, well appearing, obese older African-American female and in no distress Mental status - normal mood, behavior, speech, dress, motor activity, and thought processes Eyes - pupils equal and reactive, extraocular eye movements intact Mouth - mucous membranes moist, pharynx normal without lesions Neck - supple, no significant adenopathy Chest - clear to auscultation, no wheezes, rales or rhonchi, symmetric air entry Heart - normal rate, regular rhythm, normal S1, S2, no murmurs, rubs, clicks or gallops Extremities -trace lower extremity edema.   [facility] 12/26/2019 09/05/2019 03/07/2019  Decreased Interest 0 0 0  Down, Depressed, Hopeless 0 1 0  PHQ - 2 Score 0 1 0  Altered sleeping 0 - -  Tired, decreased energy 0 - -  Change in appetite 0 - -  Feeling bad or failure about yourself  0 - -  Trouble concentrating 0 - -  Moving slowly or fidgety/restless 0 - -  Suicidal thoughts 0 - -  PHQ-9 Score 0 - -  Difficult doing work/chores - - -  Some recent data might be hidden    CMP Latest Ref Rng & Units 05/02/2019 02/15/2019 10/22/2017  Glucose 65 - 99 mg/dL - 77 85  BUN 8 - 27 mg/dL - 13 11  Creatinine 1.610.57 - 1.00 mg/dL - 0.960.91 0.450.83  Sodium 409134 - 144 mmol/L - 135 143  Potassium 3.5 - 5.2 mmol/L - 3.9 4.0  Chloride 96  - 106 mmol/L - 96 102  CO2 20 - 29 mmol/L - 23 25  Calcium 8.7 - 10.3 mg/dL 9.9 10.7(H) 9.7  Total Protein 6.0 - 8.5 g/dL 7.4 7.9 7.1  Total Bilirubin 0.0 - 1.2 mg/dL - 0.5 0.3  Alkaline Phos 39 - 117 IU/L - 145(H) 116  AST 0 - 40 IU/L - 29 16  ALT 0 - 32 IU/L - 18 12   Lipid Panel     Component Value Date/Time   CHOL 145 02/15/2019 0849   TRIG 102 02/15/2019 0849   HDL 58 02/15/2019 0849   CHOLHDL 2.5 02/15/2019 0849   CHOLHDL 2.3 02/18/2016 0948   VLDL 25 02/18/2016 0948   LDLCALC 68 02/15/2019 0849    CBC    Component Value Date/Time   WBC 8.0 02/15/2019 0849   WBC 6.9 02/18/2016 0948   RBC 5.68 (H) 02/15/2019 0849   RBC 4.74 02/18/2016 0948   HGB 14.6 02/15/2019 0849   HCT 44.9 02/15/2019 0849   PLT 347 02/15/2019 0849   MCV 79 02/15/2019 0849   MCH 25.7 (L) 02/15/2019 0849   MCH 26.8 (L) 02/18/2016 0948   MCHC 32.5 02/15/2019 0849   MCHC 32.8 02/18/2016 0948   RDW 16.0 (H) 02/15/2019 0849   LYMPHSABS 1,863 02/18/2016 0948   MONOABS 483 02/18/2016 0948   EOSABS 138 02/18/2016 0948   BASOSABS 0 02/18/2016 0948    ASSESSMENT AND PLAN: 1. Essential hypertension At goal.  Continue current medications and low-salt diet - CBC - Comprehensive metabolic panel -  Lipid panel - hydrochlorothiazide (HYDRODIURIL) 25 MG tablet; Take 1 tablet (25 mg total) by mouth daily.  Dispense: 90 tablet; Refill: 3 - amLODipine (NORVASC) 10 MG tablet; Take 1 tablet (10 mg total) by mouth daily.  Dispense: 90 tablet; Refill: 1  2. Prediabetes 3. Class 3 severe obesity due to excess calories with body mass index (BMI) of 40.0 to 44.9 in adult, unspecified whether serious comorbidity present [facility]) -Discussed and encourage healthy eating habits. Encouraged her to speak with her therapist about ways to deal with her daughters increasing demands on her - POCT glucose (manual entry) - POCT glycosylated hemoglobin (Hb A1C) - Amb ref to Medical Nutrition Therapy-MNT  4.  Palpitations Likely due to anxiety based on the history she gave.  However patient advised that if she starts having episodes again she should let me know so that we can evaluate further. - TSH  5. Influenza vaccine refused Recommended.  Patient declined  6. Vitamin D deficiency - VITAMIN D 25 Hydroxy (Vit-D Deficiency, Fractures) - Vitamin D, Ergocalciferol, (DRISDOL) 1.25 MG (50000 UNIT) CAPS capsule; Take 1 capsule (50,000 Units total) by mouth every 7 (seven) days.  Dispense: 12 capsule; Refill: 1  7. COVID-19 vaccination declined   Patient was given the opportunity to ask questions.  Patient verbalized understanding of the plan and was able to repeat key elements of the plan.   Orders Placed This Encounter  Procedures  . CBC  . Comprehensive metabolic panel  . Lipid panel  . VITAMIN D 25 Hydroxy (Vit-D Deficiency, Fractures)  . TSH  . Amb ref to Medical Nutrition Therapy-MNT  . POCT glucose (manual entry)  . POCT glycosylated hemoglobin (Hb A1C)     Requested Prescriptions   Signed Prescriptions Disp Refills  . Vitamin D, Ergocalciferol, (DRISDOL) 1.25 MG (50000 UNIT) CAPS capsule 12 capsule 1    Sig: Take 1 capsule (50,000 Units total) by mouth every 7 (seven) days.  . hydrochlorothiazide (HYDRODIURIL) 25 MG tablet 90 tablet 3    Sig: Take 1 tablet (25 mg total) by mouth daily.  [name] amLODipine (NORVASC) 10 MG tablet 90 tablet 1    Sig: Take 1 tablet (10 mg total) by mouth daily.    Return in about 4 months (around 04/25/2020).  Jonah Blue, MD, FACP

## 2019-12-27 ENCOUNTER — Telehealth (INDEPENDENT_AMBULATORY_CARE_PROVIDER_SITE_OTHER): Payer: Self-pay

## 2019-12-27 LAB — COMPREHENSIVE METABOLIC PANEL
ALT: 16 IU/L (ref 0–32)
AST: 18 IU/L (ref 0–40)
Albumin/Globulin Ratio: 1.5 (ref 1.2–2.2)
Albumin: 4.4 g/dL (ref 3.8–4.8)
Alkaline Phosphatase: 118 IU/L (ref 44–121)
BUN/Creatinine Ratio: 16 (ref 12–28)
BUN: 16 mg/dL (ref 8–27)
Bilirubin Total: 0.2 mg/dL (ref 0.0–1.2)
CO2: 26 mmol/L (ref 20–29)
Calcium: 10.1 mg/dL (ref 8.7–10.3)
Chloride: 103 mmol/L (ref 96–106)
Creatinine, Ser: 1.01 mg/dL — ABNORMAL HIGH (ref 0.57–1.00)
GFR calc Af Amer: 69 mL/min/{1.73_m2} (ref >59)
GFR calc non Af Amer: 60 mL/min/{1.73_m2} (ref >59)
Globulin, Total: 2.9 g/dL (ref 1.5–4.5)
Glucose: 84 mg/dL (ref 65–99)
Potassium: 3.7 mmol/L (ref 3.5–5.2)
Sodium: 143 mmol/L (ref 134–144)
Total Protein: 7.3 g/dL (ref 6.0–8.5)

## 2019-12-27 LAB — CBC
Hematocrit: 36.7 % (ref 34.0–46.6)
Hemoglobin: 12.5 g/dL (ref 11.1–15.9)
MCH: 27.2 pg (ref 26.6–33.0)
MCHC: 34.1 g/dL (ref 31.5–35.7)
MCV: 80 fL (ref 79–97)
Platelets: 341 10*3/uL (ref 150–450)
RBC: 4.59 x10E6/uL (ref 3.77–5.28)
RDW: 14 % (ref 11.7–15.4)
WBC: 7.7 10*3/uL (ref 3.4–10.8)

## 2019-12-27 LAB — TSH: TSH: 1.66 u[IU]/mL (ref 0.450–4.500)

## 2019-12-27 LAB — LIPID PANEL
Chol/HDL Ratio: 2.8 ratio (ref 0.0–4.4)
Cholesterol, Total: 134 mg/dL (ref 100–199)
HDL: 48 mg/dL (ref >39)
LDL Chol Calc (NIH): 63 mg/dL (ref 0–99)
Triglycerides: 128 mg/dL (ref 0–149)
VLDL Cholesterol Cal: 23 mg/dL (ref 5–40)

## 2019-12-27 LAB — VITAMIN D 25 HYDROXY (VIT D DEFICIENCY, FRACTURES): Vit D, 25-Hydroxy: 33 ng/mL (ref 30.0–100.0)

## 2019-12-27 NOTE — Telephone Encounter (Signed)
Contacted pt to go over lab results pt didn't answer lvm  

## 2019-12-27 NOTE — Progress Notes (Signed)
Let patient know that her blood cell counts are normal.  Kidney and liver function tests are okay.  Cholesterol levels are normal.  Thyroid level is normal.  Vitamin D level is normal.  Instead of taking the vitamin D supplement once a week, I recommend changing it to 1 tablet every 2 to 3 weeks.

## 2019-12-27 NOTE — Telephone Encounter (Signed)
Error

## 2020-01-01 ENCOUNTER — Other Ambulatory Visit: Payer: Self-pay

## 2020-01-01 ENCOUNTER — Ambulatory Visit (INDEPENDENT_AMBULATORY_CARE_PROVIDER_SITE_OTHER): Payer: Medicaid Other | Admitting: Licensed Clinical Social Worker

## 2020-01-01 ENCOUNTER — Telehealth: Payer: Self-pay | Admitting: *Deleted

## 2020-01-01 ENCOUNTER — Encounter (HOSPITAL_COMMUNITY): Payer: Self-pay | Admitting: Licensed Clinical Social Worker

## 2020-01-01 DIAGNOSIS — F331 Major depressive disorder, recurrent, moderate: Secondary | ICD-10-CM

## 2020-01-01 NOTE — Telephone Encounter (Signed)
Reviewed lab results and physician's note with the patient. Answered questions regarding nutrition referral

## 2020-01-01 NOTE — Progress Notes (Signed)
Virtual Visit via Telephone Note  I connected with [name] on 01/01/2020 at 10:00 AM EST by phone enabled telehealth application and verified that I am speaking with the correct person using two identifiers. Patient does not have a computer or the Internet.   I discussed the limitations of evaluation and management by telemedicine and the availability of in person appointments. The patient expressed understanding and agreed to proceed.  LOCATION:  Patient: Home Provider: Home Office  History of Present Illness: Pt was referred for OP therapy by her neurologist Dr. Od[place] Lions for depression.   Observations/Objective: Verbalize hopeful and positive statements regarding the future. Pt presented anxious and depressed for her individual therapy session. Pt described her psychiatric symptoms and current life events.  Patient reports she continues her daily exercise routine, walking every morning. Patient was distressed during session. Patient described her current stressors: relationship with her daughter (ongoing). Patient also described her previous dr appointment with her PCP and didn't feel heard.  Reviewed after-visit summary with pt and answered her questions. Cln  assessed the family dynamics. Cln provided psychoeducation on dynamics of healthy vs unhealthy relationships associated with power and control. Role played boundaries with patient.       Assessment and plan: Counselor will continue to meet with patient to address treatment plan goals. Patient will continue to follow recommendations of providers and implement skills learned in session.  Follow Up Instructions:   I discussed the assessment and treatment plan with the patient. The patient was provided an opportunity to ask questions and all were answered. The patient agreed with the plan and demonstrated an understanding of the instructions.   The patient was advised to call back or seek an in-person evaluation if the symptoms  worsen or if the condition fails to improve as anticipated.  I provided 60 minutes of non-face-to-face time during this encounter.   Jhalen Eley S, LCAS

## 2020-01-08 ENCOUNTER — Ambulatory Visit (INDEPENDENT_AMBULATORY_CARE_PROVIDER_SITE_OTHER): Payer: Medicaid Other | Admitting: Licensed Clinical Social Worker

## 2020-01-08 ENCOUNTER — Encounter (HOSPITAL_COMMUNITY): Payer: Self-pay | Admitting: Licensed Clinical Social Worker

## 2020-01-08 ENCOUNTER — Other Ambulatory Visit: Payer: Self-pay

## 2020-01-08 DIAGNOSIS — F331 Major depressive disorder, recurrent, moderate: Secondary | ICD-10-CM

## 2020-01-08 NOTE — Progress Notes (Signed)
Virtual Visit via Telephone Note  I connected with [name] on 01/08/2020 at 10:00 AM EST by phone enabled telehealth application and verified that I am speaking with the correct person using two identifiers. Patient does not have a computer or the Internet.   I discussed the limitations of evaluation and management by telemedicine and the availability of in person appointments. The patient expressed understanding and agreed to proceed.  LOCATION:  Patient: Home Provider: Home Office  History of Present Illness: Pt was referred for OP therapy by her neurologist Dr. [place] Lions for depression.   Observations/Objective: Verbalize hopeful and positive statements regarding the future. Pt presented anxious for her individual therapy session. Pt described her psychiatric symptoms and current life events.  Patient reports she continues her daily exercise routine, walking every morning. Patient was distressed during session while describing her continued stressor: relationship with her daughter (ongoing). Used Socratic questions.Patient participated in a discussion on using mindfulness-based stress response to assist patient in lowering the stress response. Patient was encouraged to use mindfulness skills as a coping skill.       Assessment and plan: Counselor will continue to meet with patient to address treatment plan goals. Patient will continue to follow recommendations of providers and implement skills learned in session.  Follow Up Instructions:   I discussed the assessment and treatment plan with the patient. The patient was provided an opportunity to ask questions and all were answered. The patient agreed with the plan and demonstrated an understanding of the instructions.   The patient was advised to call back or seek an in-person evaluation if the symptoms worsen or if the condition fails to improve as anticipated.  I provided 60 minutes of non-face-to-face time during this  encounter.   Artina Minella S, LCAS

## 2020-01-22 ENCOUNTER — Ambulatory Visit (HOSPITAL_COMMUNITY): Payer: Medicaid Other | Admitting: Licensed Clinical Social Worker

## 2020-01-22 ENCOUNTER — Other Ambulatory Visit: Payer: Self-pay

## 2020-01-22 ENCOUNTER — Encounter (HOSPITAL_COMMUNITY): Payer: Self-pay | Admitting: Licensed Clinical Social Worker

## 2020-01-22 ENCOUNTER — Ambulatory Visit (INDEPENDENT_AMBULATORY_CARE_PROVIDER_SITE_OTHER): Payer: Medicaid Other | Admitting: Licensed Clinical Social Worker

## 2020-01-22 DIAGNOSIS — F331 Major depressive disorder, recurrent, moderate: Secondary | ICD-10-CM | POA: Diagnosis not present

## 2020-01-22 NOTE — Progress Notes (Incomplete)
Virtual Visit via Telephone Note  I connected with [name] on 01/08/2020 at 10:00 AM EST by phone enabled telehealth application and verified that I am speaking with the correct person using two identifiers. Patient does not have a computer or the Internet.   I discussed the limitations of evaluation and management by telemedicine and the availability of in person appointments. The patient expressed understanding and agreed to proceed.  LOCATION:  Patient: Home Provider: Home Office  History of Present Illness: Pt was referred for OP therapy by her neurologist Dr. [place] Lions for depression.   Observations/Objective: Verbalize hopeful and positive statements regarding the future. Pt presented anxious for her individual therapy session. Pt described her psychiatric symptoms and current life events.  Patient reports she continues her daily exercise routine, walking every morning. Patient was distressed during session while describing her continued stressor: relationship with her daughter (ongoing). Used Socratic questions.Patient participated in a discussion on using mindfulness-based stress response to assist patient in lowering the stress response. Patient was encouraged to use mindfulness skills as a coping skill.       Assessment and plan: Counselor will continue to meet with patient to address treatment plan goals. Patient will continue to follow recommendations of providers and implement skills learned in session.  Follow Up Instructions:   I discussed the assessment and treatment plan with the patient. The patient was provided an opportunity to ask questions and all were answered. The patient agreed with the plan and demonstrated an understanding of the instructions.   The patient was advised to call back or seek an in-person evaluation if the symptoms worsen or if the condition fails to improve as anticipated.  I provided 60 minutes of non-face-to-face time during this  encounter.   Dylann Gallier S, LCAS

## 2020-01-22 NOTE — Progress Notes (Signed)
Virtual Visit via Telephone Note  I connected with [name] on 01/22/2020 at 10:00 am by phone enabled telehealth application and verified that I am speaking with the correct person using two identifiers. Patient does not have a computer or the Internet.   I discussed the limitations of evaluation and management by telemedicine and the availability of in person appointments. The patient expressed understanding and agreed to proceed.  LOCATION:  Patient: Home Provider: Home Office  History of Present Illness: Pt was referred for OP therapy by her neurologist Dr. [place] Lions for depression.   Observations/Objective: Verbalize hopeful and positive statements regarding the future. Pt presented anxious for her individual therapy session. Pt described her psychiatric symptoms and current life events.  Patient reports she continues her daily exercise routine, walking every morning, however it was snowing and windy this morning, "but I walked." Patient was again distressed during session while describing her continued stressor: relationship with her daughter (ongoing), "however the holidays were ok." Used Socratic questions. Patient described her coping skills used during the holidays. "I used my boundaries we had practiced in sessions." Role played boundaries again with patient.    Assessment and plan: Counselor will continue to meet with patient to address treatment plan goals. Patient will continue to follow recommendations of providers and implement skills learned in session.  Follow Up Instructions:   I discussed the assessment and treatment plan with the patient. The patient was provided an opportunity to ask questions and all were answered. The patient agreed with the plan and demonstrated an understanding of the instructions.   The patient was advised to call back or seek an in-person evaluation if the symptoms worsen or if the condition fails to improve as anticipated.  I provided 60  minutes of non-face-to-face time during this encounter.   Victor Langenbach S, LCAS

## 2020-01-29 ENCOUNTER — Ambulatory Visit (INDEPENDENT_AMBULATORY_CARE_PROVIDER_SITE_OTHER): Payer: Medicaid Other | Admitting: Licensed Clinical Social Worker

## 2020-01-29 ENCOUNTER — Other Ambulatory Visit: Payer: Self-pay

## 2020-01-29 ENCOUNTER — Encounter (HOSPITAL_COMMUNITY): Payer: Self-pay | Admitting: Licensed Clinical Social Worker

## 2020-01-29 DIAGNOSIS — F331 Major depressive disorder, recurrent, moderate: Secondary | ICD-10-CM

## 2020-01-29 NOTE — Progress Notes (Addendum)
Comprehensive Clinical Assessment (CCA) Note  01/29/2020 [name] [id number]   LOCATION: Provider: Home Office Patient: Home Cln spent 60 minutes with patient. Telehealth: phone visit  Chief Complaint:  Chief Complaint  Patient presents with  . Depression   Visit Diagnosis: Depression   CCA Screening, Triage and Referral (STR)  Patient Reported Information How did you hear about [place]? No data recorded Referral name: No data recorded Referral phone number: No data recorded  Whom do you see for routine medical problems? No data recorded Practice/Facility Name: No data recorded Practice/Facility Phone Number: No data recorded Name of Contact: No data recorded Contact Number: No data recorded Contact Fax Number: No data recorded Prescriber Name: No data recorded Prescriber Address (if known): No data recorded  What Is the Reason for Your Visit/Call Today? No data recorded How Long Has This Been Causing You Problems? No data recorded What Do You Feel Would Help You the Most Today? No data recorded  Have You Recently Been in Any Inpatient Treatment (Hospital/Detox/Crisis Center/28-Day Program)? No data recorded Name/Location of Program/Hospital:No data recorded How Long Were You There? No data recorded When Were You Discharged? No data recorded  Have You Ever Received Services From [facility] Before? No data recorded Who Do You See at [facility]? No data recorded  Have You Recently Had Any Thoughts About Hurting Yourself? No data recorded Are You Planning to Commit Suicide/Harm Yourself At This time? No data recorded  Have you Recently Had Thoughts About Hurting Someone Karolee Ohs? No data recorded Explanation: No data recorded  Have You Used Any Alcohol or Drugs in the Past 24 Hours? No data recorded How Long Ago Did You Use Drugs or Alcohol? No data recorded What Did You Use and How Much? No data recorded  Do You Currently Have a Therapist/Psychiatrist? No data  recorded Name of Therapist/Psychiatrist: No data recorded  Have You Been Recently Discharged From Any Office Practice or Programs? No data recorded Explanation of Discharge From Practice/Program: No data recorded    CCA Screening Triage Referral Assessment Type of Contact: No data recorded Is this Initial or Reassessment? No data recorded Date Telepsych consult ordered in [facility]:  No data recorded Time Telepsych consult ordered in [facility]:  No data recorded  Patient Reported Information Reviewed? No data recorded Patient Left Without Being Seen? No data recorded Reason for Not Completing Assessment: No data recorded  Collateral Involvement: dr and therapy notes   Does Patient Have a Court Appointed Legal Guardian? No data recorded Name and Contact of Legal Guardian: No data recorded If Minor and Not Living with Parent(s), Who has Custody? No data recorded Is CPS involved or ever been involved? No data recorded Is APS involved or ever been involved? No data recorded  Patient Determined To Be At Risk for Harm To Self or Others Based on Review of Patient Reported Information or Presenting Complaint? No data recorded Method: No Plan  Availability of Means: No access or NA  Intent: Vague intent or NA  Notification Required: No need or identified person  Additional Information for Danger to Others Potential: No data recorded Additional Comments for Danger to Others Potential: No data recorded Are There Guns or Other Weapons in Your Home? No  Types of Guns/Weapons: No data recorded Are These Weapons Safely Secured?                            No data recorded Who Could Verify You  Are Able To Have These Secured: No data recorded Do You Have any Outstanding Charges, Pending Court Dates, Parole/Probation? No data recorded Contacted To Inform of Risk of Harm To Self or Others: No data recorded  Location of Assessment: No data recorded  Does Patient Present under Involuntary Commitment? No  data recorded IVC Papers Initial File Date: No data recorded  [place] of Residence: No data recorded  Patient Currently Receiving the Following Services: No data recorded  Determination of Need: No data recorded  Options For Referral: No data recorded    CCA Biopsychosocial Intake/Chief Complaint:  Patient continues to have stressors in her life and experiences anxiety and depression  Current Symptoms/Problems: anxious and depressed   Patient Reported Schizophrenia/Schizoaffective Diagnosis in Past: No   Strengths: motivated, family support  Preferences: prefers to not have these symptoms  Abilities: ability to work through her symptoms   Type of Services Patient Feels are Needed: OP services   Initial Clinical Notes/Concerns: No data recorded  Mental Health Symptoms Depression:  Change in energy/activity; Fatigue; Irritability   Duration of Depressive symptoms: Greater than two weeks   Mania:  N/A   Anxiety:   Fatigue; Irritability; Worrying   Psychosis:  N/A   Duration of Psychotic symptoms: No data recorded  Trauma:  Avoids reminders of event; Guilt/shame   Obsessions:  N/A   Compulsions:  N/A   Inattention:  N/A   Hyperactivity/Impulsivity:  N/A   Oppositional/Defiant Behaviors:  N/A   Emotional Irregularity:  N/A   Other Mood/Personality Symptoms:  No data recorded   Mental Status Exam Appearance and self-care  Stature:  Average   Weight:  Average weight   Clothing:  Casual   Grooming:  Normal   Cosmetic use:  None   Posture/gait:  Normal   Motor activity:  Not Remarkable   Sensorium  Attention:  Normal   Concentration:  Anxiety interferes   Orientation:  X5   Recall/memory:  Normal   Affect and Mood  Affect:  Depressed   Mood:  Depressed   Relating  Eye contact:  Normal   Facial expression:  Responsive   Attitude toward examiner:  Cooperative   Thought and Language  Speech flow: Normal   Thought content:   Appropriate to Mood and Circumstances   Preoccupation:  Ruminations   Hallucinations:  None   Organization:  No data recorded  [employer] of Knowledge:  Impoverished by (Comment)   Intelligence:  Below average   Abstraction:  Normal   Judgement:  Normal   Reality Testing:  Adequate   Insight:  Fair   Decision Making:  Normal   Social Functioning  Social Maturity:  Responsible   Social Judgement:  Naive   Stress  Stressors:  Family conflict; Financial   Coping Ability:  Deficient supports   Skill Deficits:  Intellect/education   Supports:  Family     Religion: Religion/Spirituality Are You A Religious Person?: Yes What is Your Religious Affiliation?: Non-Denominational  Leisure/Recreation: Leisure / Recreation Do You Have Hobbies?: No  Exercise/Diet: Exercise/Diet Do You Exercise?: Yes What Type of Exercise Do You Do?: Run/Walk How Many Times a Week Do You Exercise?: Daily Have You Gained or Lost A Significant Amount of Weight in the Past Six Months?: No Do You Follow a Special Diet?: No Do You Have Any Trouble Sleeping?: No   CCA Employment/Education Employment/Work Situation: Employment / Work Situation Employment situation: On disability Why is patient on disability: seizures and mental illness How  long has patient been on disability: 5 years Patient's job has been impacted by current illness: No What is the longest time patient has a held a job?: pt has never held a job due to her seizures Has patient ever been in the [employer]?: No  Education: Education Is Patient Currently Attending School?: No Last Grade Completed: 12 Did Garment/textile technologist From [employer]?: Yes Did Theme park manager?: No Did You Attend Graduate School?: No Did You Have An Individualized Education Program (IIEP): Yes Did You Have Any Difficulty At School?: Yes Were Any Medications Ever Prescribed For These Difficulties?: No Patient's Education Has Been  Impacted by Current Illness: No   CCA Family/Childhood History Family and Relationship History: Family history Marital status: Divorced What types of issues is patient dealing with in the relationship?: none Are you sexually active?: No Does patient have children?: Yes How many children?: 1 How is patient's relationship with their children?: patient's daughter causes her stress  Childhood History:  Childhood History By whom was/is the patient raised?: Both parents Additional childhood history information: both parents, every summer we were sent to [place] to stay with grandmother until age 64 Description of patient's relationship with caregiver when they were a child: good relationship with both parents Patient's description of current relationship with people who raised him/her: they are both deceased How were you disciplined when you got in trouble as a child/adolescent?: whipping Does patient have siblings?: Yes Number of Siblings: 2 Description of patient's current relationship with siblings: excellent relationship with sister who lives in [place], brother addict Did patient suffer any verbal/emotional/physical/sexual abuse as a child?: No Did patient suffer from severe childhood neglect?: No Has patient ever been sexually abused/assaulted/raped as an adolescent or adult?: No Was the patient ever a victim of a crime or a disaster?: No Witnessed domestic violence?: No Has patient been affected by domestic violence as an adult?: No  Child/Adolescent Assessment:     CCA Substance Use Alcohol/Drug Use: Alcohol / Drug Use History of alcohol / drug use?: No history of alcohol / drug abuse                         ASAM's:  Six Dimensions of Multidimensional Assessment  Dimension 1:  Acute Intoxication and/or Withdrawal Potential:      Dimension 2:  Biomedical Conditions and Complications:      Dimension 3:  Emotional, Behavioral, or Cognitive Conditions  and Complications:     Dimension 4:  Readiness to Change:     Dimension 5:  Relapse, Continued use, or Continued Problem Potential:     Dimension 6:  Recovery/Living Environment:     ASAM Severity Score:    ASAM Recommended Level of Treatment:     Substance use Disorder (SUD)    Recommendations for Services/Supports/Treatments: Recommendations for Services/Supports/Treatments Recommendations For Services/Supports/Treatments: Individual Therapy,Medication Management  DSM5 Diagnoses: Patient Active Problem List   Diagnosis Date Noted  . Influenza vaccine refused 12/26/2019  . Difficulty articulating words 03/07/2019  . Adjustment disorder with anxious mood 08/18/2018  . Obesity (BMI 30-39.9) 09/17/2016  . Internal hemorrhoid 09/16/2016  . Adjustment disorder with mixed anxiety and depressed mood 12/04/2015  . Primary insomnia 12/04/2015  . Prediabetes 10/09/2013  . Facial droop 10/09/2013  . Seizures (HCC) 08/24/2013  . Obesity, unspecified 10/17/2007  . 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 PES PLANUS, RIGHT 07/20/2007  . DYSTONIA 01/04/2007  . Major depressive disorder, recurrent episode, moderate (HCC) 03/18/2006  .  Essential hypertension 03/18/2006  . PSORIASIS 03/18/2006    Patient Centered Plan: Patient is on the following Treatment Plan(s):  Depression   Referrals to Alternative Service(s): Referred to Alternative Service(s):   Place:   Date:   Time:    Referred to Alternative Service(s):   Place:   Date:   Time:    Referred to Alternative Service(s):   Place:   Date:   Time:    Referred to Alternative Service(s):   Place:   Date:   Time:     Vernona RiegerMACKENZIE,Annye Forrey S, LCAS

## 2020-02-01 ENCOUNTER — Telehealth: Payer: Self-pay | Admitting: Internal Medicine

## 2020-02-01 NOTE — Telephone Encounter (Signed)
I attempted to reach [name] today to get her scheduled for a phone appt with the high risk Managed Medicaid  RN Case Manager. I left my information for her to call me back. I will attempt to reach her again in the next 7-14 days if I don't hear back from her.

## 2020-02-02 ENCOUNTER — Ambulatory Visit: Payer: Medicaid Other

## 2020-02-05 ENCOUNTER — Encounter (HOSPITAL_COMMUNITY): Payer: Self-pay | Admitting: Licensed Clinical Social Worker

## 2020-02-05 ENCOUNTER — Other Ambulatory Visit: Payer: Self-pay

## 2020-02-05 ENCOUNTER — Ambulatory Visit (INDEPENDENT_AMBULATORY_CARE_PROVIDER_SITE_OTHER): Payer: Medicaid Other | Admitting: Licensed Clinical Social Worker

## 2020-02-05 DIAGNOSIS — F331 Major depressive disorder, recurrent, moderate: Secondary | ICD-10-CM | POA: Diagnosis not present

## 2020-02-05 NOTE — Progress Notes (Signed)
Virtual Visit via Telephone Note  I connected with [name] on 02/05/2020 at 10:00 am by phone enabled telehealth application and verified that I am speaking with the correct person using two identifiers. Patient does not have a computer or the Internet.   I discussed the limitations of evaluation and management by telemedicine and the availability of in person appointments. The patient expressed understanding and agreed to proceed.  LOCATION:  Patient: Home Provider: Home Office  History of Present Illness: Pt was referred for OP therapy by her neurologist Dr. Bryce Lions for depression.   Observations/Objective: Verbalize hopeful and positive statements regarding the future. Pt presented anxious for her individual therapy session. Pt described her psychiatric symptoms and current life events.  Patient reports she continues her daily exercise routine, walking every morning, however it had snowed so she didn't walk this morning. I'm feeling much better, "I'm not sure if I had  Covid but I never got tested." Again, patient was distressed during session while describing her continued stressor: relationship with her daughter (ongoing). "Her birthday was last week and I texted her but I never heard back from her. My grandchildren called me wanting to borrow $." Used socratic questions. Validated her feelings about her daughter and grandchildren. Discussed with patient the importance of boundaries. Pt was receptive.      Assessment and plan: Counselor will continue to meet with patient to address treatment plan goals. Patient will continue to follow recommendations of providers and implement skills learned in session.  Follow Up Instructions:   I discussed the assessment and treatment plan with the patient. The patient was provided an opportunity to ask questions and all were answered. The patient agreed with the plan and demonstrated an understanding of the instructions.   The patient was  advised to call back or seek an in-person evaluation if the symptoms worsen or if the condition fails to improve as anticipated.  I provided 30 minutes of non-face-to-face time during this encounter.   Karla Pavone S, LCAS

## 2020-02-08 ENCOUNTER — Other Ambulatory Visit: Payer: Self-pay | Admitting: Psychiatry

## 2020-02-08 ENCOUNTER — Telehealth (HOSPITAL_COMMUNITY): Payer: Medicaid Other | Admitting: Psychiatry

## 2020-02-08 ENCOUNTER — Other Ambulatory Visit: Payer: Self-pay

## 2020-02-08 MED ORDER — MIRTAZAPINE 30 MG PO TABS: 30.0000 mg | ORAL_TABLET | Freq: Every day | ORAL | 0 refills | Status: DC

## 2020-02-08 NOTE — Progress Notes (Unsigned)
[name] is having a bad cold today and asked to reschedule today's appointment.

## 2020-02-12 ENCOUNTER — Encounter (HOSPITAL_COMMUNITY): Payer: Self-pay | Admitting: Licensed Clinical Social Worker

## 2020-02-12 ENCOUNTER — Ambulatory Visit (INDEPENDENT_AMBULATORY_CARE_PROVIDER_SITE_OTHER): Payer: Medicaid Other | Admitting: Licensed Clinical Social Worker

## 2020-02-12 ENCOUNTER — Other Ambulatory Visit: Payer: Self-pay

## 2020-02-12 DIAGNOSIS — F331 Major depressive disorder, recurrent, moderate: Secondary | ICD-10-CM

## 2020-02-12 NOTE — Telephone Encounter (Signed)
Pt is returning jennifer call

## 2020-02-12 NOTE — Progress Notes (Signed)
Virtual Visit via Telephone Note  I connected with [name] on 02/12/2020 at 10:00 am by phone enabled telehealth application and verified that I am speaking with the correct person using two identifiers. Patient does not have a computer or the Internet.   I discussed the limitations of evaluation and management by telemedicine and the availability of in person appointments. The patient expressed understanding and agreed to proceed.  LOCATION:  Patient: Home Provider: Home Office  History of Present Illness: Pt was referred for OP therapy by her neurologist Dr. Lennox Lions for depression.   Observations/Objective: Verbalize hopeful and positive statements regarding the future. Pt presented anxious for her individual therapy session. Pt described her psychiatric symptoms and current life events.  Patient reports she continues her daily exercise routine, walking every morning, even when it's cold. Cln suggested patient wear warm clothing: hat, coat, gloves. Pt reports "I've decided to get the Covid vaccine." Cln researched locations and made an appointment for pt for tomorrow. Pt discussed her biggest stressor:  Her daughter. "I haven't heard from my daughter or grandchildren since 1/4 when they asked me for money." Used Socratic questions. Validated her feelings.        Assessment and plan: Counselor will continue to meet with patient to address treatment plan goals. Patient will continue to follow recommendations of providers and implement skills learned in session.  Follow Up Instructions:   I discussed the assessment and treatment plan with the patient. The patient was provided an opportunity to ask questions and all were answered. The patient agreed with the plan and demonstrated an understanding of the instructions.   The patient was advised to call back or seek an in-person evaluation if the symptoms worsen or if the condition fails to improve as anticipated.  I provided 60 minutes  of non-face-to-face time during this encounter.   Davien Malone S, LCAS

## 2020-02-13 ENCOUNTER — Other Ambulatory Visit: Payer: Medicaid Other

## 2020-02-13 DIAGNOSIS — Z20822 Contact with and (suspected) exposure to covid-19: Secondary | ICD-10-CM

## 2020-02-15 LAB — NOVEL CORONAVIRUS, NAA

## 2020-02-19 ENCOUNTER — Ambulatory Visit (HOSPITAL_COMMUNITY): Payer: Medicaid Other | Admitting: Licensed Clinical Social Worker

## 2020-02-21 ENCOUNTER — Other Ambulatory Visit: Payer: Self-pay

## 2020-02-21 ENCOUNTER — Ambulatory Visit: Payer: Medicaid Other | Attending: Internal Medicine | Admitting: Pharmacist

## 2020-02-21 ENCOUNTER — Ambulatory Visit (INDEPENDENT_AMBULATORY_CARE_PROVIDER_SITE_OTHER): Payer: Medicaid Other | Admitting: Licensed Clinical Social Worker

## 2020-02-21 ENCOUNTER — Encounter (HOSPITAL_COMMUNITY): Payer: Self-pay | Admitting: Licensed Clinical Social Worker

## 2020-02-21 DIAGNOSIS — F331 Major depressive disorder, recurrent, moderate: Secondary | ICD-10-CM

## 2020-02-21 DIAGNOSIS — Z23 Encounter for immunization: Secondary | ICD-10-CM | POA: Diagnosis not present

## 2020-02-21 NOTE — Progress Notes (Signed)
Virtual Visit via Telephone Note  I connected with [name] on 02/21/2020 at 9:00 am by phone enabled telehealth application and verified that I am speaking with the correct person using two identifiers. Patient does not have a computer or the Internet.   I discussed the limitations of evaluation and management by telemedicine and the availability of in person appointments. The patient expressed understanding and agreed to proceed.  LOCATION:  Patient: Home Provider: Home Office  Therapy goal addressed: Develop the ability to recognize, accept, and cope with feelings of depression.   History of Present Illness: Pt was referred for OP therapy by her neurologist Dr. Deuel Lions for depression.   Observations/Objective: Verbalize hopeful and positive statements regarding the future. Pt presented anxious for her individual therapy session. Pt described her psychiatric symptoms and current life events.  Patient reports she continues her daily exercise routine, walking every morning, even when it's cold. Clinician used CBT to assist patient in developing the ability to recognize, accept, and cope with feelings of depression. Cln provided psychoeducation on coping skills for her depressive symptoms. Pt practiced in session.   Assessment and plan: Counselor will continue to meet with patient to address treatment plan goals. Patient will continue to follow recommendations of providers and implement skills learned in session.  Follow Up Instructions:   I discussed the assessment and treatment plan with the patient. The patient was provided an opportunity to ask questions and all were answered. The patient agreed with the plan and demonstrated an understanding of the instructions.   The patient was advised to call back or seek an in-person evaluation if the symptoms worsen or if the condition fails to improve as anticipated.  I provided 60 minutes of non-face-to-face time during this  encounter.   [name] S, LCAS

## 2020-02-21 NOTE — Progress Notes (Signed)
Patient presents for vaccination against Influenza per orders of Dr. Laural Benes. Consent given. Counseling provided. No contraindications exists. Vaccine administered without incident.   [name], PharmD, BCPS PGY2 Ambulatory Care Resident [facility]  Pharmacy

## 2020-02-26 ENCOUNTER — Encounter (HOSPITAL_COMMUNITY): Payer: Self-pay | Admitting: Licensed Clinical Social Worker

## 2020-02-26 ENCOUNTER — Other Ambulatory Visit: Payer: Self-pay

## 2020-02-26 ENCOUNTER — Telehealth: Payer: Self-pay | Admitting: Internal Medicine

## 2020-02-26 ENCOUNTER — Ambulatory Visit (INDEPENDENT_AMBULATORY_CARE_PROVIDER_SITE_OTHER): Payer: Medicaid Other | Admitting: Licensed Clinical Social Worker

## 2020-02-26 DIAGNOSIS — F331 Major depressive disorder, recurrent, moderate: Secondary | ICD-10-CM

## 2020-02-26 NOTE — Telephone Encounter (Signed)
Today was my 2nd attempt at reaching out to [name] to get her scheduled with the Managed Medicaid Team for a phone visit. I left my name and number for her to call me back. I will reach out again in the next 7-14 days if I have not heard back from her.

## 2020-02-27 NOTE — Progress Notes (Signed)
Virtual Visit via Telephone Note  I connected with [name] on 02/21/2020 at 9:00 am by phone enabled telehealth application and verified that I am speaking with the correct person using two identifiers. Patient does not have a computer or the Internet.   I discussed the limitations of evaluation and management by telemedicine and the availability of in person appointments. The patient expressed understanding and agreed to proceed.  LOCATION:  Patient: Home Provider: Home Office  Therapy goal addressed: Develop the ability to recognize, accept, and cope with feelings of depression.   History of Present Illness: Pt was referred for OP therapy by her neurologist Dr. Acquino for depression.   Observations/Objective: Verbalize hopeful and positive statements regarding the future. Pt presented anxious for her individual therapy session. Pt described her psychiatric symptoms and current life events.  Patient reports she continues her daily exercise routine, walking every morning, even when it's cold. Clinician used CBT to assist patient in developing the ability to recognize, accept, and cope with feelings of depression. Cln provided psychoeducation on coping skills for her depressive symptoms. Pt practiced in session.   Assessment and plan: Counselor will continue to meet with patient to address treatment plan goals. Patient will continue to follow recommendations of providers and implement skills learned in session.  Follow Up Instructions:   I discussed the assessment and treatment plan with the patient. The patient was provided an opportunity to ask questions and all were answered. The patient agreed with the plan and demonstrated an understanding of the instructions.   The patient was advised to call back or seek an in-person evaluation if the symptoms worsen or if the condition fails to improve as anticipated.  I provided 60 minutes of non-face-to-face time during this  encounter.   Naara Kelty S, LCAS   

## 2020-02-29 ENCOUNTER — Other Ambulatory Visit: Payer: Self-pay

## 2020-02-29 ENCOUNTER — Telehealth (INDEPENDENT_AMBULATORY_CARE_PROVIDER_SITE_OTHER): Payer: Medicaid Other | Admitting: Psychiatry

## 2020-02-29 DIAGNOSIS — F5101 Primary insomnia: Secondary | ICD-10-CM | POA: Diagnosis not present

## 2020-02-29 DIAGNOSIS — F331 Major depressive disorder, recurrent, moderate: Secondary | ICD-10-CM

## 2020-02-29 NOTE — Progress Notes (Signed)
Virtual Visit via Telephone Note  I connected with [name] on 02/29/20 at  9:00 AM EST by telephone and verified that I am speaking with the correct person using two identifiers.  Location: Patient: home Provider: office   I discussed the limitations, risks, security and privacy concerns of performing an evaluation and management service by telephone and the availability of in person appointments. I also discussed with the patient that there may be a patient responsible charge related to this service. The patient expressed understanding and agreed to proceed.   History of Present Illness: [name] was sick for 2 weeks in mid January. She was COVID negative. [name] is not sure what she was sick with but will be getting a flu shot next week. She is now feeling better. As a result her anxiety and sleep have significantly improved. Her depression is stable. [name] is actively working on removing negative people from her life. She is going out and social. She denies anhedonia and hopelessness. [name] denies SI/HI.   Observations/Objective:  General Appearance: unable to assess  Eye Contact:  unable to assess  Speech:  Clear and Coherent and Normal Rate  Volume:  Normal  Mood:  Euthymic  Affect:  Full Range  Thought Process:  Coherent and Descriptions of Associations: Circumstantial  Orientation:  Full (Time, Place, and Person)  Thought Content:  Logical  Suicidal Thoughts:  No  Homicidal Thoughts:  No  Memory:  Immediate;   Good  Judgement:  Good  Insight:  Good  Psychomotor Activity: unable to assess  Concentration:  Concentration: Good  Recall:  Good  Fund of Knowledge:  Good  Language:  Good  Akathisia:  unable to assess  Handed:  Right  AIMS (if indicated):     Assets:  Communication Skills Desire for Improvement Financial Resources/Insurance Housing Resilience Social Support Talents/Skills Vocational/Educational  ADL's:  unable to assess  Cognition:  WNL  Sleep:          Assessment and Plan: 1. Major depressive disorder, recurrent episode, moderate (HCC)  2. Primary insomnia   - continue Remeron 30mg  po qHS    - encouraged to continue therapy  Follow Up Instructions: In 2-3 months or sooner if needed   I discussed the assessment and treatment plan with the patient. The patient was provided an opportunity to ask questions and all were answered. The patient agreed with the plan and demonstrated an understanding of the instructions.   The patient was advised to call back or seek an in-person evaluation if the symptoms worsen or if the condition fails to improve as anticipated.  I provided 21 minutes of non-face-to-face time during this encounter.   , MD

## 2020-03-04 ENCOUNTER — Other Ambulatory Visit: Payer: Self-pay

## 2020-03-04 ENCOUNTER — Encounter (HOSPITAL_COMMUNITY): Payer: Self-pay | Admitting: Licensed Clinical Social Worker

## 2020-03-04 ENCOUNTER — Ambulatory Visit (INDEPENDENT_AMBULATORY_CARE_PROVIDER_SITE_OTHER): Payer: Medicaid Other | Admitting: Licensed Clinical Social Worker

## 2020-03-04 DIAGNOSIS — F331 Major depressive disorder, recurrent, moderate: Secondary | ICD-10-CM

## 2020-03-04 NOTE — Progress Notes (Signed)
Virtual Visit via Telephone Note  I connected with [name] on 03/04/2020 at 9:00 am by phone enabled telehealth application and verified that I am speaking with the correct person using two identifiers. Patient does not have a computer or the Internet.   I discussed the limitations of evaluation and management by telemedicine and the availability of in person appointments. The patient expressed understanding and agreed to proceed.  LOCATION:  Patient: Home Provider: Home Office  Therapy goal addressed: Develop the ability to recognize, accept, and cope with feelings of depression.   History of Present Illness: Pt was referred for OP therapy by her neurologist Dr. [place] Lions for depression.   Observations/Objective: Patient presented for today's session on time and was alert, oriented x5, with no evidence or self-report of SI/HI or A/V H.  Patient reported ongoing compliance with medication and denied any use of alcohol or illicit substances.  Clinician inquired about patient's current emotional ratings, as well as any significant changes in thoughts, feelings or behavior since previous session.  Patient reported scores of 4/10 for depression, 2/10 for anxiety, 0/10 for anger/irritability, and denied any reoccurrence of panic attacks. Patient reports on her continued morning walks for her daily self care. Cln encouraged pt to continue with her daily form of self care. Cln assisted pt with the questions of the PHQ-9. Where she scored 2. Clinician used CBT to assist patient in developing the ability to recognize, accept, and cope with feelings of depression. Cln provided psychoeducation on coping skills for her depressive symptoms. Pt practiced in session. Interventions were effective, as evidenced by patient reporting that it was helpful to discuss the effects and symptoms of depression. Patient stating "I feel like I have a better goal for understanding and recognizing my symptoms of depression and  ways to cope." Clinician will continue to monitor.      Assessment and plan: Counselor will continue to meet with patient to address treatment plan goals. Patient will continue to follow recommendations of providers and implement skills learned in session.  Follow Up Instructions:   I discussed the assessment and treatment plan with the patient. The patient was provided an opportunity to ask questions and all were answered. The patient agreed with the plan and demonstrated an understanding of the instructions.   The patient was advised to call back or seek an in-person evaluation if the symptoms worsen or if the condition fails to improve as anticipated.  I provided 60 minutes of non-face-to-face time during this encounter.   Ayde Record S, LCAS

## 2020-03-11 ENCOUNTER — Ambulatory Visit (INDEPENDENT_AMBULATORY_CARE_PROVIDER_SITE_OTHER): Payer: Medicaid Other | Admitting: Licensed Clinical Social Worker

## 2020-03-11 ENCOUNTER — Other Ambulatory Visit: Payer: Self-pay

## 2020-03-11 ENCOUNTER — Encounter (HOSPITAL_COMMUNITY): Payer: Self-pay | Admitting: Licensed Clinical Social Worker

## 2020-03-11 DIAGNOSIS — F331 Major depressive disorder, recurrent, moderate: Secondary | ICD-10-CM

## 2020-03-11 NOTE — Progress Notes (Signed)
Virtual Visit via Telephone Note  I connected with [name] on 03/11/2020 at 10:00 am by phone enabled telehealth application and verified that I am speaking with the correct person using two identifiers. Patient does not have a computer or the Internet.   I discussed the limitations of evaluation and management by telemedicine and the availability of in person appointments. The patient expressed understanding and agreed to proceed.  LOCATION:  Patient: Home Provider: Home Office  Therapy goal addressed: Develop the ability to recognize, accept, and cope with feelings of depression.   History of Present Illness: Pt was referred for OP therapy by her neurologist Dr. [place] Lions for depression.   Observations/Objective: Patient presented for today's session on time and was alert, oriented x5, with no evidence or self-report of SI/HI or A/V H.  Patient reported ongoing compliance with medication and denied any use of alcohol or illicit substances. Clinician inquired about patient's current emotional ratings, as well as any significant changes in thoughts, feelings or behavior since previous session.  Patient reported scores of 4/10 for depression, 2/10 for anxiety, 0/10 for anger/irritability. Patient continues her morning walks for her daily self care. Cln encouraged pt to continue with her daily form of self care. Pt reports on her continued family drama which is her biggest stressor. Cln assesses the family dynamic and proposed diversions for patient when she becomes embroiled in the drama. Pt reports no seizures.  Cln provided psychoeducation on coping skills for her depressive symptoms. Interventions were effective, as evidenced by patient reporting that it was helpful to discuss the effects and symptoms of her depression. Patient stating "I feel like I have a better goal for understanding and recognizing my triggers that increase my symptoms of depression and ways to cope." Clinician will  continue to monitor.      Assessment and plan: Counselor will continue to meet with patient to address treatment plan goals. Patient will continue to follow recommendations of providers and implement skills learned in session.  Follow Up Instructions:   I discussed the assessment and treatment plan with the patient. The patient was provided an opportunity to ask questions and all were answered. The patient agreed with the plan and demonstrated an understanding of the instructions.   The patient was advised to call back or seek an in-person evaluation if the symptoms worsen or if the condition fails to improve as anticipated.  I provided 60 minutes of non-face-to-face time during this encounter.   Khori Underberg S, LCAS

## 2020-03-13 ENCOUNTER — Ambulatory Visit
Admission: RE | Admit: 2020-03-13 | Discharge: 2020-03-13 | Disposition: A | Discharge status: Home / Self Care | Payer: Medicaid Other | Source: Ambulatory Visit | Attending: Internal Medicine | Admitting: Internal Medicine

## 2020-03-13 ENCOUNTER — Other Ambulatory Visit: Payer: Self-pay

## 2020-03-13 DIAGNOSIS — Z1231 Encounter for screening mammogram for malignant neoplasm of breast: Secondary | ICD-10-CM

## 2020-03-18 ENCOUNTER — Ambulatory Visit (HOSPITAL_COMMUNITY): Payer: Medicaid Other | Admitting: Licensed Clinical Social Worker

## 2020-03-19 ENCOUNTER — Telehealth: Payer: Self-pay

## 2020-03-19 MED FILL — VIT D2 1.25 MG (50,000 UNIT: 1.25 MG | 84 days supply | Qty: 12 | Fill #1

## 2020-03-19 MED FILL — LORATADINE 10 MG TABLET: 10 | 30 days supply | Qty: 30 | Fill #2

## 2020-03-19 MED FILL — HYDROCHLOROTHIAZIDE 25 MG T: 25 | 90 days supply | Qty: 90 | Fill #0

## 2020-03-19 MED FILL — METFORMIN HCL 500 MG TABS: 500 | 90 days supply | Qty: 90 | Fill #1

## 2020-03-19 MED FILL — LOSARTAN POTASSIUM 50 MG TA: 50 | 90 days supply | Qty: 90 | Fill #2

## 2020-03-19 MED FILL — LEVETIRACETAM ER 500 MG TAB: 500 | 90 days supply | Qty: 270 | Fill #2

## 2020-03-19 MED FILL — MIRTAZAPINE 30 MG TABLET: 30 | 90 days supply | Qty: 90 | Fill #1

## 2020-03-19 MED FILL — AMLODIPINE BESYLATE 10 MG T: 10 | 90 days supply | Qty: 90 | Fill #0

## 2020-03-19 NOTE — Telephone Encounter (Signed)
Saw pt in the lobby this morning provided mm results. Pt doesn't have any questions or concerns

## 2020-03-20 ENCOUNTER — Other Ambulatory Visit: Payer: Self-pay

## 2020-03-20 ENCOUNTER — Ambulatory Visit (INDEPENDENT_AMBULATORY_CARE_PROVIDER_SITE_OTHER): Payer: Medicaid Other | Admitting: Licensed Clinical Social Worker

## 2020-03-20 ENCOUNTER — Encounter (HOSPITAL_COMMUNITY): Payer: Self-pay | Admitting: Licensed Clinical Social Worker

## 2020-03-20 DIAGNOSIS — F331 Major depressive disorder, recurrent, moderate: Secondary | ICD-10-CM

## 2020-03-20 NOTE — Progress Notes (Signed)
Virtual Visit via Telephone Note  I connected with [name] on 03/20/2020 at 9:00 am by phone enabled telehealth application and verified that I am speaking with the correct person using two identifiers. Patient does not have a computer or the Internet.   I discussed the limitations of evaluation and management by telemedicine and the availability of in person appointments. The patient expressed understanding and agreed to proceed.  LOCATION:  Patient: Home Provider: Home Office  Therapy goal addressed: Develop the ability to recognize, accept, and cope with feelings of depression.   History of Present Illness: Pt was referred for OP therapy by her neurologist Dr. [place] Lions for depression.   Observations/Objective: Patient presented for today'[name] session on time and was alert, oriented x5, with no evidence or self-report of SI/HI or A/V H.  Patient reported ongoing compliance with medication and denied any use of alcohol or illicit substances. Clinician inquired about patient'[name] current emotional ratings, as well as any significant changes in thoughts, feelings or behavior since previous session.  Patient reported scores of 4/10 for depression, 2/10 for anxiety, 0/10 for anger/irritability. Patient continues her morning walks for her daily self care. Cln encouraged pt to continue with her daily form of self care. Pt reports she had a seizure Sunday. Used socratic questions to determine what caused the seizure, how she worked through it, and aftercare. Her anxiety is exacerbated by her toxic family and being in crowds. Cln provided psychoeducation on breathing exercises to assist with anxiety. Cln taught pt positive affirmations to use daily as part of her self care.  Cln completed C-SSRS and GAD7. On GAD7 pt scored 9, mild anxiety. Cln reviewed each question/answer of the screenings.     Assessment and plan: Counselor will continue to meet with patient to address treatment plan goals. Patient will  continue to follow recommendations of providers and implement skills learned in session.  Follow Up Instructions:   I discussed the assessment and treatment plan with the patient. The patient was provided an opportunity to ask questions and all were answered. The patient agreed with the plan and demonstrated an understanding of the instructions.   The patient was advised to call back or seek an in-person evaluation if the symptoms worsen or if the condition fails to improve as anticipated.  I provided 60 minutes of non-face-to-face time during this encounter.   [name], LCAS

## 2020-03-25 ENCOUNTER — Encounter (HOSPITAL_COMMUNITY): Payer: Self-pay | Admitting: Licensed Clinical Social Worker

## 2020-03-25 ENCOUNTER — Ambulatory Visit (INDEPENDENT_AMBULATORY_CARE_PROVIDER_SITE_OTHER): Payer: Medicaid Other | Admitting: Licensed Clinical Social Worker

## 2020-03-25 ENCOUNTER — Other Ambulatory Visit: Payer: Self-pay

## 2020-03-25 DIAGNOSIS — F331 Major depressive disorder, recurrent, moderate: Secondary | ICD-10-CM

## 2020-03-25 NOTE — Progress Notes (Signed)
Virtual Visit via Telephone Note  I connected with [name] on 03/25/2020 at 10:00 am by phone enabled telehealth application and verified that I am speaking with the correct person using two identifiers. Patient does not have a computer or the Internet.   I discussed the limitations of evaluation and management by telemedicine and the availability of in person appointments. The patient expressed understanding and agreed to proceed.  LOCATION:  Patient: Home Provider: Home Office  Therapy goal addressed: Develop the ability to recognize, accept, and cope with feelings of depression.   History of Present Illness: Pt was referred for OP therapy by her neurologist Dr. [place] [name] for depression.   Observations/Objective: Patient presented for today's session on time and was alert, oriented x5, with no evidence or self-report of SI/HI or A/V H.  Patient reported ongoing compliance with medication and denied any use of alcohol or illicit substances. Clinician inquired about patient's current emotional ratings, as well as any significant changes in thoughts, feelings or behavior since previous session.  Patient reported scores of 4/10 for depression, 2/10 for anxiety, 0/10 for anger/irritability. Patient continues her morning walks for her daily self care. Cln encouraged pt to continue with her daily form of self care. Cln completed  Pain and nutritional screenings. Cln reviewed each question/answer individually with patient. Pt reports she has not experienced any seizures since last report. Cln validated her success if not surrounding herself with toxic people within the last week.     Assessment and plan: Counselor will continue to meet with patient to address treatment plan goals. Patient will continue to follow recommendations of providers and implement skills learned in session.  Follow Up Instructions:   I discussed the assessment and treatment plan with the patient. The patient was provided  an opportunity to ask questions and all were answered. The patient agreed with the plan and demonstrated an understanding of the instructions.   The patient was advised to call back or seek an in-person evaluation if the symptoms worsen or if the condition fails to improve as anticipated.  I provided 60 minutes of non-face-to-face time during this encounter.   [name] S, LCAS

## 2020-03-26 ENCOUNTER — Other Ambulatory Visit: Payer: Self-pay

## 2020-03-26 ENCOUNTER — Ambulatory Visit: Payer: Medicaid Other | Admitting: Pharmacist

## 2020-04-01 ENCOUNTER — Other Ambulatory Visit: Payer: Self-pay

## 2020-04-01 ENCOUNTER — Ambulatory Visit (INDEPENDENT_AMBULATORY_CARE_PROVIDER_SITE_OTHER): Payer: Medicaid Other | Admitting: Licensed Clinical Social Worker

## 2020-04-01 ENCOUNTER — Encounter (HOSPITAL_COMMUNITY): Payer: Self-pay | Admitting: Licensed Clinical Social Worker

## 2020-04-01 DIAGNOSIS — F331 Major depressive disorder, recurrent, moderate: Secondary | ICD-10-CM

## 2020-04-01 NOTE — Progress Notes (Signed)
Virtual Visit via Telephone Note  I connected with [name] on 04/01/2020 at 10:00 am by phone enabled telehealth application and verified that I am speaking with the correct person using two identifiers. Patient does not have a computer or the Internet.   I discussed the limitations of evaluation and management by telemedicine and the availability of in person appointments. The patient expressed understanding and agreed to proceed.  LOCATION:  Patient: Home Provider: Home Office  Therapy goal addressed: Develop the ability to recognize, accept, and cope with feelings of depression.   History of Present Illness: Pt was referred for OP therapy by her neurologist Dr. [place] Lions for depression.   Observations/Objective: Patient presented for today's session on time and was alert, oriented x5, with no evidence or self-report of SI/HI or A/V H.  Patient reported ongoing compliance with medication and denied any use of alcohol or illicit substances. Clinician inquired about patient's current emotional ratings, as well as any significant changes in thoughts, feelings or behavior since previous session.  Patient reported scores of 4/10 for depression, 2/10 for anxiety, 0/10 for anger/irritability. Patient continues her morning walks for her daily self care. Cln encouraged pt to continue with her daily form of self care. Pt reports she has not experienced any seizures since last report. Cln validated her success is not surrounding herself with toxic people within the last week. 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 Cln explored safety with patient  Since she's chosen not to vaccinate.    Assessment and plan: Counselor will continue to meet with patient to address treatment plan goals. Patient will continue to follow recommendations of providers and implement skills learned in session.  Follow Up Instructions:   I discussed the assessment and treatment plan with the patient. The  patient was provided an opportunity to ask questions and all were answered. The patient agreed with the plan and demonstrated an understanding of the instructions.   The patient was advised to call back or seek an in-person evaluation if the symptoms worsen or if the condition fails to improve as anticipated.  I provided 60 minutes of non-face-to-face time during this encounter.   Malayasia Mirkin S, LCAS

## 2020-04-08 ENCOUNTER — Ambulatory Visit (INDEPENDENT_AMBULATORY_CARE_PROVIDER_SITE_OTHER): Payer: Medicaid Other | Admitting: Licensed Clinical Social Worker

## 2020-04-08 ENCOUNTER — Encounter (HOSPITAL_COMMUNITY): Payer: Self-pay | Admitting: Licensed Clinical Social Worker

## 2020-04-08 ENCOUNTER — Other Ambulatory Visit: Payer: Self-pay

## 2020-04-08 DIAGNOSIS — F331 Major depressive disorder, recurrent, moderate: Secondary | ICD-10-CM | POA: Diagnosis not present

## 2020-04-08 NOTE — Progress Notes (Signed)
Virtual Visit via Telephone Note  I connected with [name] on 04/08/2020 at 10:00 am by phone enabled telehealth application and verified that I am speaking with the correct person using two identifiers. Patient does not have a computer or the Internet.   I discussed the limitations of evaluation and management by telemedicine and the availability of in person appointments. The patient expressed understanding and agreed to proceed.  LOCATION:  Patient: Home Provider: Home Office  Therapy goal addressed: Pt will have less depressive symptoms aeb reducing from 8-3 for depression.   History of Present Illness: Pt was referred for OP therapy by her neurologist Dr. [place] Lions for depression.   Observations/Objective: Patient presented for today's session on time and was alert, oriented x5, with no evidence or self-report of SI/HI or A/V H.  Patient reported ongoing compliance with medication and denied any use of alcohol or illicit substances. Clinician inquired about patient's current emotional ratings, as well as any significant changes in thoughts, feelings or behavior since previous session.  Patient reported scores of 8/10 for depression, 2/10 for anxiety, 0/10 for anger/irritability. Cln used socratic questions helping pt identify triggers for increase in her depressive symptoms: HUD living (people selling drugs). Cln explored CBT common  techniques used for depression with depression including cognitive restructuring, thought journaling, and mindful meditation.   Patient continues her morning walks for her daily self care. Cln encouraged pt to continue with her daily form of self care. Pt reports she has not experienced any seizures since last report. Cln validated her success is not surrounding herself with toxic people within the last week. 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 Cln explored safety with patient  since she's chosen not to vaccinate.    Assessment and plan:  Counselor will continue to meet with patient to address treatment plan goals. Patient will continue to follow recommendations of providers and implement skills learned in session.  Follow Up Instructions:   I discussed the assessment and treatment plan with the patient. The patient was provided an opportunity to ask questions and all were answered. The patient agreed with the plan and demonstrated an understanding of the instructions.   The patient was advised to call back or seek an in-person evaluation if the symptoms worsen or if the condition fails to improve as anticipated.  I provided 45 minutes of non-face-to-face time during this encounter.   Eldean Nanna S, LCAS

## 2020-04-15 ENCOUNTER — Ambulatory Visit (INDEPENDENT_AMBULATORY_CARE_PROVIDER_SITE_OTHER): Payer: Medicaid Other | Admitting: Licensed Clinical Social Worker

## 2020-04-15 ENCOUNTER — Other Ambulatory Visit: Payer: Self-pay

## 2020-04-15 ENCOUNTER — Encounter (HOSPITAL_COMMUNITY): Payer: Self-pay | Admitting: Licensed Clinical Social Worker

## 2020-04-15 DIAGNOSIS — F331 Major depressive disorder, recurrent, moderate: Secondary | ICD-10-CM

## 2020-04-15 NOTE — Progress Notes (Signed)
Virtual Visit via Telephone Note  I connected with [name] on 04/15/2020 at 10:00 am by phone enabled telehealth application and verified that I am speaking with the correct person using two identifiers. Patient does not have a computer or the Internet.   I discussed the limitations of evaluation and management by telemedicine and the availability of in person appointments. The patient expressed understanding and agreed to proceed.  LOCATION:  Patient: Home Provider: Home Office  Therapy goal addressed: Pt will have less depressive symptoms aeb reducing from 8-3 for depression.   History of Present Illness: Pt was referred for OP therapy by her neurologist Dr. [place] Lions for depression.   Observations/Objective: Patient presented for today's session on time and was alert, oriented x5, with no evidence or self-report of SI/HI or A/V H.  Patient reported ongoing compliance with medication and denied any use of alcohol or illicit substances. Clinician inquired about patient's current emotional ratings, as well as any significant changes in thoughts, feelings or behavior since previous session.  Patient reported scores of 7/10 for depression, 2/10 for anxiety, 0/10 for anger/irritability. Patient described her depressive symptom triggers (toxic family relationships). Cln has suggested patient limit her interactions with family toxic relationships and if necessary, use her boundaries. Cln explored boundaries with patient, how to use them even with family. Pt practiced boundaries in session, Cln role played with patient how to use boundaries with family. Patient continues her morning walks for her daily self care. Cln encouraged pt to continue with her daily form of self care. Pt reports she has not experienced any seizures since last report.  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 Cln explored safety with patient  since she's chosen not to vaccinate.    Assessment and plan:  Counselor will continue to meet with patient to address treatment plan goals. Patient will continue to follow recommendations of providers and implement skills learned in session.  Follow Up Instructions:   I discussed the assessment and treatment plan with the patient. The patient was provided an opportunity to ask questions and all were answered. The patient agreed with the plan and demonstrated an understanding of the instructions.   The patient was advised to call back or seek an in-person evaluation if the symptoms worsen or if the condition fails to improve as anticipated.  I provided 60 minutes of non-face-to-face time during this encounter.   Llewelyn Sheaffer S, LCAS

## 2020-04-22 ENCOUNTER — Encounter (HOSPITAL_COMMUNITY): Payer: Self-pay | Admitting: Licensed Clinical Social Worker

## 2020-04-22 ENCOUNTER — Ambulatory Visit (INDEPENDENT_AMBULATORY_CARE_PROVIDER_SITE_OTHER): Payer: Medicaid Other | Admitting: Licensed Clinical Social Worker

## 2020-04-22 ENCOUNTER — Other Ambulatory Visit: Payer: Self-pay

## 2020-04-22 DIAGNOSIS — F331 Major depressive disorder, recurrent, moderate: Secondary | ICD-10-CM

## 2020-04-22 NOTE — Progress Notes (Signed)
Virtual Visit via Telephone Note  I connected with [name] on 04/22/2020 at 10:00 am by phone enabled telehealth application and verified that I am speaking with the correct person using two identifiers. Patient does not have a computer or the Internet.   I discussed the limitations of evaluation and management by telemedicine and the availability of in person appointments. The patient expressed understanding and agreed to proceed.  LOCATION:  Patient: Home Provider: Home Office  Therapy goal addressed: Pt will have less depressive symptoms aeb reducing from 8-3 for depression.   History of Present Illness: Pt was referred for OP therapy by her neurologist Dr. [place] Lions for depression.   Observations/Objective: Patient presented for today's session on time and was alert, oriented x5, with no evidence or self-report of SI/HI or A/V H.  Patient reported ongoing compliance with medication and denied any use of alcohol or illicit substances. Clinician inquired about patient's current emotional ratings, as well as any significant changes in thoughts, feelings or behavior since previous session.  Patient reported scores of 7/10 for depression, 2/10 for anxiety, 0/10 for anger/irritability. Patient described her depressive symptom triggers (toxic family relationships), however patient reports she is limiting her interactions with family. Pt is getting the shingles vaccine this month. Patient had a lot of questions about vaccines. Assisted pt making a list of questions for her pcp about vaccines. Patient continues her morning walks for her daily self care. Cln encouraged pt to continue with her daily form of self care. Pt reports she has not experienced any seizures since last report.  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 Cln explored safety with patient  since she's chosen not to vaccinate.    Assessment and plan: Counselor will continue to meet with patient to address treatment  plan goals. Patient will continue to follow recommendations of providers and implement skills learned in session.  Follow Up Instructions:   I discussed the assessment and treatment plan with the patient. The patient was provided an opportunity to ask questions and all were answered. The patient agreed with the plan and demonstrated an understanding of the instructions.   The patient was advised to call back or seek an in-person evaluation if the symptoms worsen or if the condition fails to improve as anticipated.  I provided 45 minutes of non-face-to-face time during this encounter.   Gustavus Haskin S, LCAS

## 2020-04-23 ENCOUNTER — Other Ambulatory Visit: Payer: Self-pay

## 2020-04-23 ENCOUNTER — Ambulatory Visit: Payer: Medicaid Other | Attending: Internal Medicine | Admitting: Pharmacist

## 2020-04-23 ENCOUNTER — Encounter: Payer: Self-pay | Admitting: Internal Medicine

## 2020-04-23 ENCOUNTER — Ambulatory Visit: Payer: Medicaid Other | Attending: Internal Medicine | Admitting: Internal Medicine

## 2020-04-23 VITALS — BP 151/90 | HR 78 | Resp 16 | Wt 204.8 lb

## 2020-04-23 DIAGNOSIS — I10 Essential (primary) hypertension: Secondary | ICD-10-CM

## 2020-04-23 DIAGNOSIS — L602 Onychogryphosis: Secondary | ICD-10-CM | POA: Diagnosis not present

## 2020-04-23 DIAGNOSIS — Z6837 Body mass index (BMI) 37.0-37.9, adult: Secondary | ICD-10-CM | POA: Diagnosis not present

## 2020-04-23 DIAGNOSIS — Z23 Encounter for immunization: Secondary | ICD-10-CM

## 2020-04-23 DIAGNOSIS — R252 Cramp and spasm: Secondary | ICD-10-CM | POA: Diagnosis not present

## 2020-04-23 DIAGNOSIS — L309 Dermatitis, unspecified: Secondary | ICD-10-CM

## 2020-04-23 MED ORDER — HYDRALAZINE HCL 25 MG PO TABS
25.0000 mg | ORAL_TABLET | Freq: Two times a day (BID) | ORAL | 4 refills | Status: DC
  Filled 2020-04-23: qty 60, 30d supply, fill #0
  Filled 2020-06-19: qty 60, 30d supply, fill #1

## 2020-04-23 NOTE — Progress Notes (Signed)
Patient ID: [name], female    DOB: March 08, 1955  MRN: [id number]  CC: Hypertension   Subjective: [name] is a 65 y.o. female who presents for chronic ds management Her concerns today include:  Pt with hx of depression, HTN, Sz dsand pseudoSZ, pre-DM, trichotillomania, internal hemorrhoids, seasonal allergies.  Obesity/PreDM: loss 20 lbs according to our scale today.  "I got rid of all things white in my cupboard."  No longer frying foods.  Cut out eating junk foods.  No sugars.  Can feel clothes more loose.  C/o severe muscle spasms in RT foot.  Started 02/2020.  Having to walk on outer portion of foot.  Reports having 2 surgeries in past to correct this problem; last surgery was in 2009. -nails on both feet are thick and discolored.  HTN: Compliant with Norvasc/hydrochlorothiazide and Cozaar and took them already this morning.  However she states that the Cozaar makes her feel as though she is about to have a heart attack whenever she takes it.  She did not tolerate lisinopril in the past.  No chest pains or shortness of breath.  No swelling in the legs.      She has noticed new hypopigmented spots appearing on the shoulders since last month.  They do not itch.    Patient Active Problem List   Diagnosis Date Noted  . Influenza vaccine refused 12/26/2019  . Difficulty articulating words 03/07/2019  . Adjustment disorder with anxious mood 08/18/2018  . Obesity (BMI 30-39.9) 09/17/2016  . Internal hemorrhoid 09/16/2016  . Adjustment disorder with mixed anxiety and depressed mood 12/04/2015  . Primary insomnia 12/04/2015  . Prediabetes 10/09/2013  . Facial droop 10/09/2013  . Seizures (HCC) 08/24/2013  . Obesity, unspecified 10/17/2007  . 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 PES PLANUS, RIGHT 07/20/2007  . DYSTONIA 01/04/2007  . Major depressive disorder, recurrent episode, moderate (HCC) 03/18/2006  . Essential hypertension 03/18/2006  . PSORIASIS 03/18/2006      Current Outpatient Medications on File Prior to Visit  Medication Sig Dispense Refill  . amLODipine (NORVASC) 10 MG tablet Take 1 tablet (10 mg total) by mouth daily. 90 tablet 1  . aspirin EC 81 MG tablet Take 1 tablet (81 mg total) by mouth daily. 100 tablet 1  . Blood Pressure Monitor DEVI Use as directed to check home blood pressure 2-3 times a week 1 Device 0  . hydrochlorothiazide (HYDRODIURIL) 25 MG tablet Take 1 tablet (25 mg total) by mouth daily. 90 tablet 3  . levETIRAcetam (KEPPRA XR) 500 MG 24 hr tablet Take 1 tablet three times a day 270 tablet 3  . loratadine (CLARITIN) 10 MG tablet TAKE 1 TABLET (10 MG TOTAL) BY MOUTH DAILY. 60 tablet 2  . metFORMIN (GLUCOPHAGE) 500 MG tablet TAKE 1 TABLET (500 MG TOTAL) BY MOUTH DAILY WITH BREAKFAST. 60 tablet 2  . mirtazapine (REMERON) 30 MG tablet Take 1 tablet (30 mg total) by mouth at bedtime. 90 tablet 0  . Vitamin D, Ergocalciferol, (DRISDOL) 1.25 MG (50000 UNIT) CAPS capsule Take 1 capsule (50,000 Units total) by mouth every 7 (seven) days. 12 capsule 1   No current facility-administered medications on file prior to visit.    Allergies  Allergen Reactions  . Codeine Anaphylaxis  . Shellfish Allergy Swelling and Rash    Throat Sweling  . Apple Cider Vinegar     syncope  . Cinnamon Hives  . Lisinopril     cough  . Opium  Interferes with Keppra  . Banana Rash    Cannot eat any while taking KEPPRA  . Catfish [Fish Allergy] Rash  . Orange Fruit [Citrus] Rash    Cannot have while taking KEPPRA  . Tomato Itching and Rash    Social History   Socioeconomic History  . Marital status: Divorced    Spouse name: Not on file  . Number of children: 1  . Years of education: Not on file  . Highest education level: Not on file  Occupational History  . Not on file  Tobacco Use  . Smoking status: Never Smoker  . Smokeless tobacco: Never Used  Vaping Use  . Vaping Use: Never used  Substance and Sexual Activity  . Alcohol  use: No    Alcohol/week: 0.0 standard drinks  . Drug use: No  . Sexual activity: Never  Other Topics Concern  . Not on file  Social History Narrative   Right handed    Lives alone    Lives in appartment   Social Determinants of Health   Financial Resource Strain: Not on file  Food Insecurity: Not on file  Transportation Needs: Not on file  Physical Activity: Not on file  Stress: Not on file  Social Connections: Not on file  Intimate Partner Violence: Not on file    Family History  Problem Relation Age of Onset  . Asthma Mother   . Hypertension Father   . Heart disease Father   . Stroke Father   . Cancer Sister        uterus cancer   . Diabetes Maternal Uncle   . Breast cancer Cousin   . Colon cancer Neg Hx     Past Surgical History:  Procedure Laterality Date  . ABDOMINAL HYSTERECTOMY    . COLONOSCOPY N/A 07/15/2015   Dr. Darrick Penna: small-mouth diverticula, small non-bleeding internal hemorrhoids, redundant colon   . HERNIA REPAIR    . MYOMECTOMY      ROS: Review of Systems Negative except as stated above  PHYSICAL EXAM: BP (!) 151/90   Pulse 78   Resp 16   Wt 204 lb 12.8 oz (92.9 kg)   SpO2 95%   BMI 37.46 kg/m   Wt Readings from Last 3 Encounters:  04/23/20 204 lb 12.8 oz (92.9 kg)  12/26/19 224 lb 6.4 oz (101.8 kg)  06/21/19 211 lb (95.7 kg)  Repeat BP 157/96  Physical Exam  General appearance - alert, well appearing, and in no distress Mental status -patient pleasant, flat affect.  Good eye contact. Neck - supple, no significant adenopathy Chest - clear to auscultation, no wheezes, rales or rhonchi, symmetric air entry Heart - normal rate, regular rhythm, normal S1, S2, no murmurs, rubs, clicks or gallops Extremities - peripheral pulses normal, no pedal edema, no clubbing or cyanosis Skin -macular hyperpigmented spot on right shoulder about 1 cm x 1 cm in size.  She has a similar 1 on the left shoulder.  Toenails are very thick, slightly overgrown  and discolored but does not appear to be onychomycosis. Foot exam: Mild disfigurement of the toes.  Flat-footed.  Good dorsalis pedis and posterior tibialis pulses.   CMP Latest Ref Rng & Units 12/26/2019 05/02/2019 02/15/2019  Glucose 65 - 99 mg/dL 84 - 77  BUN 8 - 27 mg/dL 16 - 13  Creatinine 0.81 - 1.00 mg/dL 4.48(J) - 8.56  Sodium 134 - 144 mmol/L 143 - 135  Potassium 3.5 - 5.2 mmol/L 3.7 - 3.9  Chloride 96 -  106 mmol/L 103 - 96  CO2 20 - 29 mmol/L 26 - 23  Calcium 8.7 - 10.3 mg/dL 27.7 9.9 10.7(H)  Total Protein 6.0 - 8.5 g/dL 7.3 7.4 7.9  Total Bilirubin 0.0 - 1.2 mg/dL 0.2 - 0.5  Alkaline Phos 44 - 121 IU/L 118 - 145(H)  AST 0 - 40 IU/L 18 - 29  ALT 0 - 32 IU/L 16 - 18   Lipid Panel     Component Value Date/Time   CHOL 134 12/26/2019 0949   TRIG 128 12/26/2019 0949   HDL 48 12/26/2019 0949   CHOLHDL 2.8 12/26/2019 0949   CHOLHDL 2.3 02/18/2016 0948   VLDL 25 02/18/2016 0948   LDLCALC 63 12/26/2019 0949    CBC    Component Value Date/Time   WBC 7.7 12/26/2019 0949   WBC 6.9 02/18/2016 0948   RBC 4.59 12/26/2019 0949   RBC 4.74 02/18/2016 0948   HGB 12.5 12/26/2019 0949   HCT 36.7 12/26/2019 0949   PLT 341 12/26/2019 0949   MCV 80 12/26/2019 0949   MCH 27.2 12/26/2019 0949   MCH 26.8 (L) 02/18/2016 0948   MCHC 34.1 12/26/2019 0949   MCHC 32.8 02/18/2016 0948   RDW 14.0 12/26/2019 0949   LYMPHSABS 1,863 02/18/2016 0948   MONOABS 483 02/18/2016 0948   EOSABS 138 02/18/2016 0948   BASOSABS 0 02/18/2016 0948    ASSESSMENT AND PLAN: 1. Essential hypertension Not at goal. Reports side effects with Cozaar so we will discontinue that.  Start hydralazine instead.  Follow-up with clinical pharmacist in a few weeks for blood pressure recheck. - hydrALAZINE (APRESOLINE) 25 MG tablet; Take 1 tablet (25 mg total) by mouth 2 (two) times daily.  Dispense: 60 tablet; Refill: 4  2. Class 2 severe obesity due to excess calories with serious comorbidity and body mass index  (BMI) of 37.0 to 37.9 in adult [facility]) Commended her on weight loss.  Encouraged her to keep up the good work with healthy eating habits.  Encouraged her to move as much as she can.  3. Foot spasms - Ambulatory referral to Podiatry  4. Overgrown nail Referral to podiatrist for toenail clipping.  The discoloration of her nails does not look like onychomycosis. - Ambulatory referral to Podiatry  5. Dermatitis - Ambulatory referral to Dermatology  6. Need for shingles vaccine given    Patient was given the opportunity to ask questions.  Patient verbalized understanding of the plan and was able to repeat key elements of the plan.   Orders Placed This Encounter  Procedures  . Ambulatory referral to Podiatry  . Ambulatory referral to Dermatology     Requested Prescriptions   Signed Prescriptions Disp Refills  . hydrALAZINE (APRESOLINE) 25 MG tablet 60 tablet 4    Sig: Take 1 tablet (25 mg total) by mouth 2 (two) times daily.    Return in about 4 months (around 08/23/2020) for Give appt with [facility] in 2 wks for BP recheck.  Jonah Blue, MD, FACP

## 2020-04-23 NOTE — Progress Notes (Signed)
Pt states she has brown spots on her body pt denies pain    Pt states the losartan makes her feel like she is having a heart attack

## 2020-04-23 NOTE — Progress Notes (Signed)
Patient presents for vaccination against zoster per orders of Dr. Johnson. Consent given. Counseling provided. No contraindications exists. Vaccine administered without incident.   Luke Van Ausdall, PharmD, BCACP, CPP Clinical Pharmacist [facility] [phone]  

## 2020-04-23 NOTE — Patient Instructions (Addendum)
Stop Losartan. Start Hydralazine instead.  New prescription sent to your pharmacy.  Zoster Vaccine, Recombinant injection What is this medicine? ZOSTER VACCINE (ZOS ter vak SEEN) is a vaccine used to reduce the risk of getting shingles. This vaccine is not used to treat shingles or nerve pain from shingles. This medicine may be used for other purposes; ask your health care provider or pharmacist if you have questions. COMMON BRAND NAME(S): [employer] What should I tell my health care provider before I take this medicine? They need to know if you have any of these conditions:  cancer  immune system problems  an unusual or allergic reaction to Zoster vaccine, other medications, foods, dyes, or preservatives  pregnant or trying to get pregnant  breast-feeding How should I use this medicine? This vaccine is injected into a muscle. It is given by a health care provider. A copy of Vaccine Information Statements will be given before each vaccination. Be sure to read this information carefully each time. This sheet may change often. Talk to your health care provider about the use of this vaccine in children. This vaccine is not approved for use in children. Overdosage: If you think you have taken too much of this medicine contact a poison control center or emergency room at once. NOTE: This medicine is only for you. Do not share this medicine with others. What if I miss a dose? Keep appointments for follow-up (booster) doses. It is important not to miss your dose. Call your health care provider if you are unable to keep an appointment. What may interact with this medicine?  medicines that suppress your immune system  medicines to treat cancer  steroid medicines like prednisone or cortisone This list may not describe all possible interactions. Give your health care provider a list of all the medicines, herbs, non-prescription drugs, or dietary supplements you use. Also tell them if you smoke,  drink alcohol, or use illegal drugs. Some items may interact with your medicine. What should I watch for while using this medicine? Visit your health care provider regularly. This vaccine, like all vaccines, may not fully protect everyone. What side effects may I notice from receiving this medicine? Side effects that you should report to your doctor or health care professional as soon as possible:  allergic reactions (skin rash, itching or hives; swelling of the face, lips, or tongue)  trouble breathing Side effects that usually do not require medical attention (report these to your doctor or health care professional if they continue or are bothersome):  chills  headache  fever  nausea  pain, redness, or irritation at site where injected  tiredness  vomiting This list may not describe all possible side effects. Call your doctor for medical advice about side effects. You may report side effects to FDA at 1-800-FDA-1088. Where should I keep my medicine? This vaccine is only given by a health care provider. It will not be stored at home. NOTE: This sheet is a summary. It may not cover all possible information. If you have questions about this medicine, talk to your doctor, pharmacist, or health care provider.  2021 Elsevier/Gold Standard (2019-02-10 16:23:07)

## 2020-04-24 ENCOUNTER — Telehealth: Payer: Self-pay | Admitting: Internal Medicine

## 2020-04-24 NOTE — Telephone Encounter (Signed)
Will forward to Nora  

## 2020-04-24 NOTE — Telephone Encounter (Signed)
Copied from CRM [id number]. Topic: Referral - Status >> Apr 24, 2020  9:28 AM Marylen Ponto wrote: Reason for CRM: Pt stated she needs a dermatologist that is in [place] because she does not drive. Pt requests referral to a dermatologist in [place]

## 2020-04-25 ENCOUNTER — Other Ambulatory Visit: Payer: Self-pay

## 2020-04-25 ENCOUNTER — Telehealth (INDEPENDENT_AMBULATORY_CARE_PROVIDER_SITE_OTHER): Payer: Medicaid Other | Admitting: Psychiatry

## 2020-04-25 DIAGNOSIS — F331 Major depressive disorder, recurrent, moderate: Secondary | ICD-10-CM

## 2020-04-25 DIAGNOSIS — F5101 Primary insomnia: Secondary | ICD-10-CM

## 2020-04-25 MED ORDER — MIRTAZAPINE 30 MG PO TABS
30.0000 mg | ORAL_TABLET | Freq: Every day | ORAL | 0 refills | Status: DC
  Filled 2020-04-25: qty 30, 30d supply, fill #0
  Filled 2020-06-19: qty 90, 90d supply, fill #0

## 2020-04-25 NOTE — Progress Notes (Signed)
Virtual Visit via Telephone Note  I connected with [name] on 04/25/20 at 10:30 AM EDT by telephone and verified that I am speaking with the correct person using two identifiers.  Location: Patient: home Provider: office   I discussed the limitations, risks, security and privacy concerns of performing an evaluation and management service by telephone and the availability of in person appointments. I also discussed with the patient that there may be a patient responsible charge related to this service. The patient expressed understanding and agreed to proceed.   History of Present Illness: [name] shares that she is doing well. Her depression is stable and manageable. She is active and social. [name] denies anhedonia. She is eating and sleeping well. She has lost 20 lbs by making diet changes. She is no longer eating processed food. She denies SI/HI. Her anxiety is mild and mostly situational.    Observations/Objective:  General Appearance: unable to assess  Eye Contact:  unable to assess  Speech:  Clear and Coherent and Normal Rate  Volume:  Normal  Mood:  Euthymic  Affect:  Full Range  Thought Process:  Coherent and Descriptions of Associations: Circumstantial  Orientation:  Full (Time, Place, and Person)  Thought Content:  Logical  Suicidal Thoughts:  No  Homicidal Thoughts:  No  Memory:  Immediate;   Good  Judgement:  Good  Insight:  Good  Psychomotor Activity: unable to assess  Concentration:  Concentration: Good  Recall:  Good  Fund of Knowledge:  Good  Language:  Good  Akathisia:  unable to assess  Handed:  Right  AIMS (if indicated):     Assets:  Communication Skills Desire for Improvement Financial Resources/Insurance Housing Resilience Social Support Talents/Skills Transportation Vocational/Educational  ADL's:  unable to assess  Cognition:  WNL  Sleep:        Assessment and Plan: Depression screen [facility] 2/9 04/25/2020 03/04/2020 12/26/2019 09/05/2019  03/07/2019  Decreased Interest 0 1 0 0 0  Down, Depressed, Hopeless 0 1 0 1 0  PHQ - 2 Score 0 2 0 1 0  Altered sleeping 0 0 0 - -  Tired, decreased energy 0 0 0 - -  Change in appetite 0 0 0 - -  Feeling bad or failure about yourself  0 0 0 - -  Trouble concentrating 0 0 0 - -  Moving slowly or fidgety/restless 0 0 0 - -  Suicidal thoughts 0 0 0 - -  PHQ-9 Score 0 2 0 - -  Difficult doing work/chores Not difficult at all Not difficult at all - - -  Some recent data might be hidden    Flowsheet Row Video Visit from 04/25/2020 in [facility] Counselor from 03/20/2020 in [facility] [place]  C-SSRS RISK CATEGORY No Risk No Risk     1. Major depressive disorder, recurrent episode, moderate (HCC) - mirtazapine (REMERON) 30 MG tablet; Take 1 tablet (30 mg total) by mouth at bedtime.  Dispense: 90 tablet; Refill: 0  2. Primary insomnia - mirtazapine (REMERON) 30 MG tablet; Take 1 tablet (30 mg total) by mouth at bedtime.  Dispense: 90 tablet; Refill: 0    Follow Up Instructions: In 3 months or sooner if needed   I discussed the assessment and treatment plan with the patient. The patient was provided an opportunity to ask questions and all were answered. The patient agreed with the plan and demonstrated an understanding of the instructions.   The patient was advised to  call back or seek an in-person evaluation if the symptoms worsen or if the condition fails to improve as anticipated.  I provided 19 minutes of non-face-to-face time during this encounter.   Oletta Darter, MD

## 2020-04-26 NOTE — Telephone Encounter (Signed)
Sent Referral to [facility] Dr Joanna Puff  Ph. # [phone] address [address] [place], [place] [address] ([address]). They will contact the patient to schedule in [facility] or [place]  They have locations in [place]

## 2020-04-29 ENCOUNTER — Ambulatory Visit (INDEPENDENT_AMBULATORY_CARE_PROVIDER_SITE_OTHER): Payer: Medicaid Other | Admitting: Licensed Clinical Social Worker

## 2020-04-29 ENCOUNTER — Encounter (HOSPITAL_COMMUNITY): Payer: Self-pay | Admitting: Licensed Clinical Social Worker

## 2020-04-29 ENCOUNTER — Other Ambulatory Visit: Payer: Self-pay

## 2020-04-29 DIAGNOSIS — F331 Major depressive disorder, recurrent, moderate: Secondary | ICD-10-CM | POA: Diagnosis not present

## 2020-04-29 NOTE — Progress Notes (Signed)
Virtual Visit via Telephone Note  I connected with [name] on 04/22/2020 at 10:00 am by phone enabled telehealth application and verified that I am speaking with the correct person using two identifiers. Patient does not have a computer or the Internet.   I discussed the limitations of evaluation and management by telemedicine and the availability of in person appointments. The patient expressed understanding and agreed to proceed.  LOCATION:  Patient: Home Provider: Home Office  Therapy goal addressed: Pt will have less depressive symptoms aeb reducing from 8-3 for depression.   History of Present Illness: Pt was referred for OP therapy by her neurologist Dr. [place] Lions for depression.   Observations/Objective: Patient presented for today's session on time and was alert, oriented x5, with no evidence or self-report of SI/HI or A/V H.  Patient reported ongoing compliance with medication and denied any use of alcohol or illicit substances. Clinician inquired about patient's current emotional ratings, as well as any significant changes in thoughts, feelings or behavior since previous session.  Patient reported scores of 7/10 for depression, 2/10 for anxiety, 0/10 for anger/irritability. Patient described her continued depressive symptom triggers (toxic family relationships). Pt reports she continues to limit her family interactions,however patient reported she has seen her daughter within the last week. Cln explored boundaries with patient, role played boundaries with patient to use with family. Pt reports she got the shingles vaccine with few side effects.    Assessment and plan: Counselor will continue to meet with patient to address treatment plan goals. Patient will continue to follow recommendations of providers and implement skills learned in session.  Follow Up Instructions:   I discussed the assessment and treatment plan with the patient. The patient was provided an opportunity to ask  questions and all were answered. The patient agreed with the plan and demonstrated an understanding of the instructions.   The patient was advised to call back or seek an in-person evaluation if the symptoms worsen or if the condition fails to improve as anticipated.  I provided 45 minutes of non-face-to-face time during this encounter.   [name], LCAS

## 2020-04-30 ENCOUNTER — Ambulatory Visit (INDEPENDENT_AMBULATORY_CARE_PROVIDER_SITE_OTHER): Payer: Medicaid Other | Admitting: Podiatry

## 2020-04-30 ENCOUNTER — Other Ambulatory Visit: Payer: Self-pay

## 2020-04-30 DIAGNOSIS — E119 Type 2 diabetes mellitus without complications: Secondary | ICD-10-CM

## 2020-04-30 DIAGNOSIS — B351 Tinea unguium: Secondary | ICD-10-CM

## 2020-04-30 DIAGNOSIS — M79675 Pain in left toe(s): Secondary | ICD-10-CM | POA: Diagnosis not present

## 2020-04-30 DIAGNOSIS — M79674 Pain in right toe(s): Secondary | ICD-10-CM | POA: Diagnosis not present

## 2020-05-01 ENCOUNTER — Encounter: Payer: Self-pay | Admitting: Podiatry

## 2020-05-01 NOTE — Progress Notes (Signed)
  Subjective:  Patient ID: [name], female    DOB: 09-16-1955,  MRN: [id number]  Chief Complaint  Patient presents with  . Nail Problem     (np) Overgrown nail;thick toenails    65 y.o. female presents with the above complaint. History confirmed with patient. She has type 2 diabetes on metformin.  Objective:  Physical Exam: warm, good capillary refill, no trophic changes or ulcerative lesions, normal DP and PT pulses, normal monofilament exam and normal sensory exam. Thickened elongated toenails x10 with subungual debris and brown/yellow discoloration  Assessment:   1. Pain due to onychomycosis of toenails of both feet   2. Controlled type 2 diabetes mellitus without complication, without long-term current use of insulin (HCC)      Plan:  Patient was evaluated and treated and all questions answered.  Patient educated on diabetes. Discussed proper diabetic foot care and discussed risks and complications of disease. Educated patient in depth on reasons to return to the office immediately should he/she discover anything concerning or new on the feet. All questions answered. Discussed proper shoes as well.   Discussed the etiology and treatment options for the condition in detail with the patient. Educated patient on the topical and oral treatment options for mycotic nails. Recommended debridement of the nails today. Sharp and mechanical debridement performed of all painful and mycotic nails today. Nails debrided in length and thickness using a nail nipper to level of comfort. Discussed treatment options including appropriate shoe gear. Follow up as needed for painful nails.   Return if symptoms worsen or fail to improve.

## 2020-05-06 ENCOUNTER — Other Ambulatory Visit: Payer: Self-pay

## 2020-05-06 ENCOUNTER — Ambulatory Visit (INDEPENDENT_AMBULATORY_CARE_PROVIDER_SITE_OTHER): Payer: Medicaid Other | Admitting: Licensed Clinical Social Worker

## 2020-05-06 ENCOUNTER — Encounter (HOSPITAL_COMMUNITY): Payer: Self-pay | Admitting: Licensed Clinical Social Worker

## 2020-05-06 DIAGNOSIS — F331 Major depressive disorder, recurrent, moderate: Secondary | ICD-10-CM | POA: Diagnosis not present

## 2020-05-06 NOTE — Progress Notes (Signed)
Virtual Visit via Telephone Note  I connected with [name] on 05/06/2020 at 10:00 am by phone enabled telehealth application and verified that I am speaking with the correct person using two identifiers. Patient does not have a computer or the Internet.   I discussed the limitations of evaluation and management by telemedicine and the availability of in person appointments. The patient expressed understanding and agreed to proceed.  LOCATION:  Patient: Home Provider: Home Office  Therapy goal addressed: Pt will have less depressive symptoms aeb reducing from 8-3 for depression.   History of Present Illness: Pt was referred for OP therapy by her neurologist Dr. [place] Lions for depression.   Observations/Objective: Patient presented for today's session on time and was alert, oriented x5, with no evidence or self-report of SI/HI or A/V H.  Patient reported ongoing compliance with medication and denied any use of alcohol or illicit substances. Clinician inquired about patient's current emotional ratings, as well as any significant changes in thoughts, feelings or behavior since previous session.  Patient reported scores of 7/10 for depression, 2/10 for anxiety, 2/10 for anger/irritability. Patient described her continued depressive symptom triggers (toxic family relationships). Pt reports she continues to limit her family interactions, however patient reported she has seen her daughter within the last week, however patient did report that she used her boundaries with her daughter and it did not end well. Clinician utilized MI OARS to affirm her concerns and discussed the necessity to continue to use her boundaries. Cln explored with patient coping skills to use when patient has negative responses from her daughter, role played with patient.     Assessment and plan: Counselor will continue to meet with patient to address treatment plan goals. Patient will continue to follow recommendations of  providers and implement skills learned in session.  Follow Up Instructions:   I discussed the assessment and treatment plan with the patient. The patient was provided an opportunity to ask questions and all were answered. The patient agreed with the plan and demonstrated an understanding of the instructions.   The patient was advised to call back or seek an in-person evaluation if the symptoms worsen or if the condition fails to improve as anticipated.  I provided 60 minutes of non-face-to-face time during this encounter.   Damari Hiltz S, LCAS

## 2020-05-07 ENCOUNTER — Encounter: Payer: Self-pay | Admitting: Pharmacist

## 2020-05-07 ENCOUNTER — Other Ambulatory Visit: Payer: Self-pay

## 2020-05-07 ENCOUNTER — Ambulatory Visit: Payer: Medicaid Other | Attending: Internal Medicine | Admitting: Pharmacist

## 2020-05-07 VITALS — BP 137/84

## 2020-05-07 DIAGNOSIS — I10 Essential (primary) hypertension: Secondary | ICD-10-CM

## 2020-05-07 NOTE — Progress Notes (Signed)
    S:    PCP: Dr. Laural Benes  Patient arrives in good spirits. Presents to the clinic for hypertension evaluation, counseling, and management. Patient was referred and last seen by Primary Care Provider on 04/23/2020. At that visit, losartan was stopped d/t side effects. Hydralazine was started in place of the losartan.   Medication adherence: today, pt tells me she is taking the hydralazine daily but takes only one tablet once daily instead of one tablet BID as prescribed.  Current BP Medications include:  Amlodipine 10 mg daily, HCTZ 25 mg daily, hydralazine 25 mg BID (only taking daily)  Dietary habits include: tries to limit salt; denies drinking excessive caffeine  Exercise habits include: limited  Family / Social history:  - FHx: asthma, HTN, heart disease, stroke  - Tobacco: never smoker  - Alcohol: denies use   O:  Vitals:   05/07/20 1057  BP: 137/84   Home BP readings: none  Last 3 Office BP readings: BP Readings from Last 3 Encounters:  05/07/20 137/84  04/23/20 (!) 151/90  12/26/19 110/70    BMET    Component Value Date/Time   NA 143 12/26/2019 0949   K 3.7 12/26/2019 0949   CL 103 12/26/2019 0949   CO2 26 12/26/2019 0949   GLUCOSE 84 12/26/2019 0949   GLUCOSE 86 02/18/2016 0948   BUN 16 12/26/2019 0949   CREATININE 1.01 (H) 12/26/2019 0949   CREATININE 0.89 02/18/2016 0948   CALCIUM 10.1 12/26/2019 0949   GFRNONAA 60 12/26/2019 0949   GFRNONAA 73 08/17/2014 1013   GFRAA 69 12/26/2019 0949   GFRAA 84 08/17/2014 1013    Renal function: CrCl cannot be calculated (Patient's most recent lab result is older than the maximum 21 days allowed.).  Clinical ASCVD: No  The 10-year ASCVD risk score Denman George DC Jr., et al., 2013) is: 15.5%   Values used to calculate the score:     Age: 65 years     Sex: Female     Is Non-Hispanic African American: Yes     Diabetic: Yes     Tobacco smoker: No     Systolic Blood Pressure: 137 mmHg     Is BP treated: Yes     HDL  Cholesterol: 48 mg/dL     Total Cholesterol: 134 mg/dL  A/P: Hypertension longstanding currently above goal but improved from last visit on current medications. BP Goal = < 130/80 mmHg. Medication adherence reported, however, she is taking hydralazine daily instead of BID. I encouraged her to take BID and she verbalized understanding.  -Continued hydralazine 25 mg BID. Encouraged patient to take BID.  - Continued amlodipine, HCTZ at current doses.  -Counseled on lifestyle modifications for blood pressure control including reduced dietary sodium, increased exercise, adequate sleep.  Results reviewed and written information provided.   Total time in face-to-face counseling 30 minutes.   F/U Clinic Visit in 2 weeks.    Butch Penny, PharmD, Patsy [place], CPP Clinical Pharmacist [facility] [phone]

## 2020-05-13 ENCOUNTER — Other Ambulatory Visit: Payer: Self-pay

## 2020-05-13 ENCOUNTER — Ambulatory Visit (INDEPENDENT_AMBULATORY_CARE_PROVIDER_SITE_OTHER): Payer: Medicaid Other | Admitting: Licensed Clinical Social Worker

## 2020-05-13 ENCOUNTER — Encounter (HOSPITAL_COMMUNITY): Payer: Self-pay | Admitting: Licensed Clinical Social Worker

## 2020-05-13 DIAGNOSIS — F331 Major depressive disorder, recurrent, moderate: Secondary | ICD-10-CM | POA: Diagnosis not present

## 2020-05-13 NOTE — Progress Notes (Signed)
Virtual Visit via Telephone Note  I connected with [name] on 05/13/2020 at 10:00 am by phone enabled telehealth application and verified that I am speaking with the correct person using two identifiers. Patient does not have a computer or the Internet.   I discussed the limitations of evaluation and management by telemedicine and the availability of in person appointments. The patient expressed understanding and agreed to proceed.  LOCATION:  Patient: Home Provider: Home Office  Therapy goal addressed: Pt will have less depressive symptoms aeb reducing from 8-3 for depression.   History of Present Illness: Pt was referred for OP therapy by her neurologist Dr. [place] Lions for depression.   Observations/Objective: Patient presented for today's session on time and was alert, oriented x5, with no evidence or self-report of SI/HI or A/V H.  Patient reported ongoing compliance with medication and denied any use of alcohol or illicit substances. Clinician inquired about patient's current emotional ratings, as well as any significant changes in thoughts, feelings or behavior since previous session.  Patient reported scores of 5/10 for depression, 2/10 for anxiety, 2/10 for anger/irritability. Patient described her continued depressive symptom triggers (toxic family relationships). Pt reports she continues to limit her family interactions and her daughter was out of town for spring break, so "my moods have been more stable." Cln explored with pt the correlation between her daughter's absence with pt's moods. Patient's mood also influences her seizures. Cln provided psychoeducation on the benefits of mindfulness skills. Pt continues with her daily walking and prayer every morning as part of her self-care.     Assessment and plan: Counselor will continue to meet with patient to address treatment plan goals. Patient will continue to follow recommendations of providers and implement skills learned in  session.  Follow Up Instructions:   I discussed the assessment and treatment plan with the patient. The patient was provided an opportunity to ask questions and all were answered. The patient agreed with the plan and demonstrated an understanding of the instructions.   The patient was advised to call back or seek an in-person evaluation if the symptoms worsen or if the condition fails to improve as anticipated.  I provided 60 minutes of non-face-to-face time during this encounter.   Koryn Charlot S, LCAS

## 2020-05-20 ENCOUNTER — Encounter (HOSPITAL_COMMUNITY): Payer: Self-pay | Admitting: Licensed Clinical Social Worker

## 2020-05-20 ENCOUNTER — Ambulatory Visit (INDEPENDENT_AMBULATORY_CARE_PROVIDER_SITE_OTHER): Payer: Medicaid Other | Admitting: Licensed Clinical Social Worker

## 2020-05-20 ENCOUNTER — Other Ambulatory Visit: Payer: Self-pay

## 2020-05-20 DIAGNOSIS — F331 Major depressive disorder, recurrent, moderate: Secondary | ICD-10-CM

## 2020-05-20 NOTE — Progress Notes (Signed)
Virtual Visit via Telephone Note  I connected with [name] on 05/20/2020 at 10:00 am by phone enabled telehealth application and verified that I am speaking with the correct person using two identifiers. Patient does not have a computer or the Internet.   I discussed the limitations of evaluation and management by telemedicine and the availability of in person appointments. The patient expressed understanding and agreed to proceed.  LOCATION:  Patient: Home Provider: Home Office  Therapy goal addressed: Pt will have less depressive symptoms aeb reducing from 8-3 for depression.   History of Present Illness: Pt was referred for OP therapy by her neurologist Dr. [place] Lions for depression.   Observations/Objective: Patient presented for today's session on time and was alert, oriented x5, with no evidence or self-report of SI/HI or A/V H.  Patient reported ongoing compliance with medication and denied any use of alcohol or illicit substances. Clinician inquired about patient's current emotional ratings, as well as any significant changes in thoughts, feelings or behavior since previous session.  Patient reported scores of 5/10 for depression, 2/10 for anxiety, 2/10 for anger/irritability. Patient was excited to share she is getting her front teeth fixed. I have struggled with my front teeth my whole life. Cln congratulated pt on following through with her self care. Pt reports for the last week the toxic family relationships has been minimal. Cln explored with pt how her daughter's toxic behavior affects patient's moods and affect her seizures.Cln provided psychoeducation on the benefits of mindfulness skills. Pt continues with her daily walking and prayer every morning as part of her self-care.     Assessment and plan: Counselor will continue to meet with patient to address treatment plan goals. Patient will continue to follow recommendations of providers and implement skills learned in  session.  Follow Up Instructions:   I discussed the assessment and treatment plan with the patient. The patient was provided an opportunity to ask questions and all were answered. The patient agreed with the plan and demonstrated an understanding of the instructions.   The patient was advised to call back or seek an in-person evaluation if the symptoms worsen or if the condition fails to improve as anticipated.  I provided 30 minutes of non-face-to-face time during this encounter.   Nickie Warwick S, LCAS

## 2020-05-21 ENCOUNTER — Other Ambulatory Visit: Payer: Self-pay

## 2020-05-21 ENCOUNTER — Encounter: Payer: Self-pay | Admitting: Pharmacist

## 2020-05-21 ENCOUNTER — Ambulatory Visit: Payer: Medicaid Other | Attending: Internal Medicine | Admitting: Pharmacist

## 2020-05-21 VITALS — BP 144/83 | HR 72

## 2020-05-21 DIAGNOSIS — I10 Essential (primary) hypertension: Secondary | ICD-10-CM | POA: Diagnosis not present

## 2020-05-21 NOTE — Progress Notes (Signed)
    S:    PCP: Dr. Laural Benes  Patient arrives in good spirits. Presents to the clinic for hypertension evaluation, counseling, and management. Patient was referred and last seen by Primary Care Provider on 04/23/2020. Last seen by clinical pharmacist on 05/07/2020 - blood pressure regimen continued, and patient was encourage to take hydralazine twice daily rather than once daily.   Today, patient reports great adherence to prescribed medications. Denies dizziness, headaches or chest pains. Reports that spacing out medication administration is helpful in reducing dizziness. Patient had only taking HCTZ this AM - reports she takes amlodipine at 10 AM and hydralazine at noon and 7 PM. Patient does have a blood pressure cuff at home but is not able to provide any home readings today.    Current BP Medications include:  Amlodipine 10 mg daily, HCTZ 25 mg daily, hydralazine 25 mg BID  Dietary habits include: tries to limit salt; denies coffee or caffeine  Exercise habits include: limited  Family / Social history:  - FHx: asthma, HTN, heart disease, stroke  - Tobacco: never smoker  - Alcohol: denies use   O:  Vitals:   05/21/20 1225  BP: (!) 144/83  Pulse: 72   Home BP readings: none  Last 3 Office BP readings: BP Readings from Last 3 Encounters:  05/21/20 (!) 144/83  05/07/20 137/84  04/23/20 (!) 151/90    BMET    Component Value Date/Time   NA 143 12/26/2019 0949   K 3.7 12/26/2019 0949   CL 103 12/26/2019 0949   CO2 26 12/26/2019 0949   GLUCOSE 84 12/26/2019 0949   GLUCOSE 86 02/18/2016 0948   BUN 16 12/26/2019 0949   CREATININE 1.01 (H) 12/26/2019 0949   CREATININE 0.89 02/18/2016 0948   CALCIUM 10.1 12/26/2019 0949   GFRNONAA 60 12/26/2019 0949   GFRNONAA 73 08/17/2014 1013   GFRAA 69 12/26/2019 0949   GFRAA 84 08/17/2014 1013    Renal function: CrCl cannot be calculated (Patient's most recent lab result is older than the maximum 21 days allowed.).  Clinical ASCVD: No   The 10-year ASCVD risk score Denman George DC Jr., et al., 2013) is: 17.6%   Values used to calculate the score:     Age: 65 years     Sex: Female     Is Non-Hispanic African American: Yes     Diabetic: Yes     Tobacco smoker: No     Systolic Blood Pressure: 144 mmHg     Is BP treated: Yes     HDL Cholesterol: 48 mg/dL     Total Cholesterol: 134 mg/dL  A/P: Hypertension longstanding currently above goal. BP Goal = < 130/80 mmHg. Medication adherence reported. BP elevated today, however, has only taken HCTZ this AM. Strongly feel that she will reach goal once taking amlodipine and hydralazine. Encouraged her to schedule next follow up at a later time after all blood pressures had been taken.  -Continued current regimen with amlodipine, hctz and hydralazine  -Counseled on lifestyle modifications for blood pressure control including reduced dietary sodium, increased exercise, adequate sleep.  Results reviewed and written information provided.   Total time in face-to-face counseling 30 minutes.   F/U Pharmacist visit scheduled for 06/18/2020.  Theodis Sato, PharmD PGY-1 [facility] Pharm[facility] Resident   05/21/2020 12:56 PM

## 2020-05-22 DIAGNOSIS — H5213 Myopia, bilateral: Secondary | ICD-10-CM | POA: Diagnosis not present

## 2020-05-27 ENCOUNTER — Encounter (HOSPITAL_COMMUNITY): Payer: Self-pay | Admitting: Licensed Clinical Social Worker

## 2020-05-27 ENCOUNTER — Ambulatory Visit (INDEPENDENT_AMBULATORY_CARE_PROVIDER_SITE_OTHER): Payer: Medicaid Other | Admitting: Licensed Clinical Social Worker

## 2020-05-27 ENCOUNTER — Other Ambulatory Visit: Payer: Self-pay

## 2020-05-27 DIAGNOSIS — F331 Major depressive disorder, recurrent, moderate: Secondary | ICD-10-CM | POA: Diagnosis not present

## 2020-05-27 NOTE — Progress Notes (Signed)
Virtual Visit via Telephone Note  I connected with [name] on 05/27/2020 at 10:00 am by phone enabled telehealth application and verified that I am speaking with the correct person using two identifiers. Patient does not have a computer or the Internet.   I discussed the limitations of evaluation and management by telemedicine and the availability of in person appointments. The patient expressed understanding and agreed to proceed.  LOCATION:  Patient: Home Provider: Home Office  Therapy goal addressed: Pt will have less depressive symptoms aeb reducing from 8-3 for depression.   History of Present Illness: Pt was referred for OP therapy by her neurologist Dr. [place] Lions for depression.   Observations/Objective: Patient presented for today's session on time and was alert, oriented x5, with no evidence or self-report of SI/HI or A/V H.  Patient reported ongoing compliance with medication and denied any use of alcohol or illicit substances. Clinician inquired about patient's current emotional ratings, as well as any significant changes in thoughts, feelings or behavior since previous session.  Patient reported scores of 9/10 for depression, 8/10 for anxiety, 6/10 for anger/irritability. Cln explored with patient incidents with her grandchildren last week, her grandson who is 5 was suspended from school for extreme behavioral issues. Cln explored with pt how her daughter's toxic behavior affects patient's moods and affect her seizures. Cln provided psychoeducation on the benefits of diversion. Pt continues with her daily walking and prayer every morning as part of her self-care.     Assessment and plan: Counselor will continue to meet with patient to address treatment plan goals. Patient will continue to follow recommendations of providers and implement skills learned in session.  Follow Up Instructions:   I discussed the assessment and treatment plan with the patient. The patient was provided  an opportunity to ask questions and all were answered. The patient agreed with the plan and demonstrated an understanding of the instructions.   The patient was advised to call back or seek an in-person evaluation if the symptoms worsen or if the condition fails to improve as anticipated.  I provided 60 minutes of non-face-to-face time during this encounter.   Delenn Ahn S, LCAS

## 2020-06-03 ENCOUNTER — Ambulatory Visit (INDEPENDENT_AMBULATORY_CARE_PROVIDER_SITE_OTHER): Payer: Medicaid Other | Admitting: Licensed Clinical Social Worker

## 2020-06-03 ENCOUNTER — Other Ambulatory Visit: Payer: Self-pay

## 2020-06-03 ENCOUNTER — Encounter (HOSPITAL_COMMUNITY): Payer: Self-pay | Admitting: Licensed Clinical Social Worker

## 2020-06-03 DIAGNOSIS — F331 Major depressive disorder, recurrent, moderate: Secondary | ICD-10-CM | POA: Diagnosis not present

## 2020-06-03 NOTE — Progress Notes (Signed)
Virtual Visit via Telephone Note  I connected with [name] on 06/03/2020 at 10:00 am by phone enabled telehealth application and verified that I am speaking with the correct person using two identifiers. Patient does not have a computer or the Internet.   I discussed the limitations of evaluation and management by telemedicine and the availability of in person appointments. The patient expressed understanding and agreed to proceed.  LOCATION:  Patient: Home Provider: Home Office  Therapy goal addressed: Pt will have less depressive symptoms aeb reducing from 8-3 for depression.   History of Present Illness: Pt was referred for OP therapy by her neurologist Dr. [place] Lions for depression.   Observations/Objective: Patient presented for today's session on time and was alert, oriented x5, with no evidence or self-report of SI/HI or A/V H.  Patient reported ongoing compliance with medication and denied any use of alcohol or illicit substances. Clinician inquired about patient's current emotional ratings, as well as any significant changes in thoughts, feelings or behavior since previous session.  Patient reported scores of 7/10 for depression, 7/10 for anxiety, 5/10 for anger/irritability. Cln completed Nutrition assessment., reviewed answers with pt. Pt discussed her biggest stressor: her daughter and her grandchildren. Pt expressed concerns. Clinician utilized MI OARS to reflect and summarize thoughts and feelings. Cln encouraged pt to  continues with her daily walking and prayer every morning as part of her self-care.     Assessment and plan: Counselor will continue to meet with patient to address treatment plan goals. Patient will continue to follow recommendations of providers and implement skills learned in session.  Follow Up Instructions:   I discussed the assessment and treatment plan with the patient. The patient was provided an opportunity to ask questions and all were answered. The  patient agreed with the plan and demonstrated an understanding of the instructions.   The patient was advised to call back or seek an in-person evaluation if the symptoms worsen or if the condition fails to improve as anticipated.  I provided 60 minutes of non-face-to-face time during this encounter.   Tamarra Geiselman S, LCAS

## 2020-06-10 ENCOUNTER — Encounter (HOSPITAL_COMMUNITY): Payer: Self-pay | Admitting: Licensed Clinical Social Worker

## 2020-06-10 ENCOUNTER — Other Ambulatory Visit: Payer: Self-pay

## 2020-06-10 ENCOUNTER — Ambulatory Visit (INDEPENDENT_AMBULATORY_CARE_PROVIDER_SITE_OTHER): Payer: Medicaid Other | Admitting: Licensed Clinical Social Worker

## 2020-06-10 DIAGNOSIS — F331 Major depressive disorder, recurrent, moderate: Secondary | ICD-10-CM | POA: Diagnosis not present

## 2020-06-10 NOTE — Progress Notes (Signed)
Virtual Visit via Telephone Note  I connected with [name] on 06/10/2020 at 10:00 am by phone enabled telehealth application and verified that I am speaking with the correct person using two identifiers. Patient does not have a computer or the Internet.   I discussed the limitations of evaluation and management by telemedicine and the availability of in person appointments. The patient expressed understanding and agreed to proceed.  LOCATION:  Patient: Home Provider: Home Office    History of Present Illness: Pt was referred for OP therapy by her neurologist Dr. [place] Lions for depression.   Observations/Objective: Patient presented for today's session on time and was alert, oriented x5, with no evidence or self-report of SI/HI or A/V H.  Patient reported ongoing compliance with medication and denied any use of alcohol or illicit substances. Clinician inquired about patient's current emotional ratings, as well as any significant changes in thoughts, feelings or behavior since previous session.  Patient reported scores of 7/10 for depression, 7/10 for anxiety, 2/10 for anger/irritability. Cln reviewed tx plan with pt who verbalized acceptance of the plan. Pt reports having a small seizure toady. Cln used socratic questions. Cln assisted pt identfiying what triggered her seizure. Cln suggested pt continue to write down day/time/trigger of her seizures. Cln encouraged pt to  continues with her daily walking and prayer every morning as part of her self-care.     Assessment and plan: Counselor will continue to meet with patient to address treatment plan goals. Patient will continue to follow recommendations of providers and implement skills learned in session.  Follow Up Instructions:   I discussed the assessment and treatment plan with the patient. The patient was provided an opportunity to ask questions and all were answered. The patient agreed with the plan and demonstrated an understanding  of the instructions.   The patient was advised to call back or seek an in-person evaluation if the symptoms worsen or if the condition fails to improve as anticipated.  I provided 60 minutes of non-face-to-face time during this encounter.   Ellean Firman S, LCAS

## 2020-06-18 ENCOUNTER — Ambulatory Visit: Payer: Medicaid Other | Admitting: Pharmacist

## 2020-06-19 ENCOUNTER — Encounter (HOSPITAL_COMMUNITY): Payer: Self-pay | Admitting: Licensed Clinical Social Worker

## 2020-06-19 ENCOUNTER — Other Ambulatory Visit: Payer: Self-pay

## 2020-06-19 ENCOUNTER — Ambulatory Visit (INDEPENDENT_AMBULATORY_CARE_PROVIDER_SITE_OTHER): Payer: Medicaid Other | Admitting: Licensed Clinical Social Worker

## 2020-06-19 DIAGNOSIS — F331 Major depressive disorder, recurrent, moderate: Secondary | ICD-10-CM

## 2020-06-19 MED FILL — Ergocalciferol Cap 1.25 MG (50000 Unit): ORAL | 56 days supply | Qty: 8 | Fill #0 | Status: AC

## 2020-06-19 MED FILL — Hydrochlorothiazide Tab 25 MG: ORAL | 90 days supply | Qty: 90 | Fill #0 | Status: AC

## 2020-06-19 MED FILL — Levetiracetam Tab ER 24HR 500 MG: ORAL | 90 days supply | Qty: 270 | Fill #0 | Status: AC

## 2020-06-19 MED FILL — Metformin HCl Tab 500 MG: ORAL | 30 days supply | Qty: 30 | Fill #0 | Status: AC

## 2020-06-19 MED FILL — Amlodipine Besylate Tab 10 MG (Base Equivalent): ORAL | 90 days supply | Qty: 90 | Fill #0 | Status: AC

## 2020-06-19 NOTE — Progress Notes (Signed)
Virtual Visit via Telephone Note  I connected with [name] on 06/19/2020 at 2:00 am by phone enabled telehealth application and verified that I am speaking with the correct person using two identifiers. Patient does not have a computer or the Internet.   I discussed the limitations of evaluation and management by telemedicine and the availability of in person appointments. The patient expressed understanding and agreed to proceed.  LOCATION:  Patient: Home Provider: Home Office    History of Present Illness: Pt was referred for OP therapy by her neurologist Dr. [place] Lions for depression.   Observations/Objective: Patient presented for today's session on time and was alert, oriented x5, with no evidence or self-report of SI/HI or A/V H.  Patient reported ongoing compliance with medication and denied any use of alcohol or illicit substances. Clinician inquired about patient's current emotional ratings, as well as any significant changes in thoughts, feelings or behavior since previous session.  Patient reported scores of 5/10 for depression, 5/10 for anxiety, 1/10 for anger/irritability. Patient reports on her current stressors: daughter, grandchildren. Clinician utilized MI OARS to affirm her concerns. Clinician challenged her thoughts. Clinician processed options for communicating her concerns.  Cln encouraged pt to  continues with her daily walking and prayer every morning as part of her self-care.     Assessment and plan: Counselor will continue to meet with patient to address treatment plan goals. Patient will continue to follow recommendations of providers and implement skills learned in session.  Follow Up Instructions:   I discussed the assessment and treatment plan with the patient. The patient was provided an opportunity to ask questions and all were answered. The patient agreed with the plan and demonstrated an understanding of the instructions.   The patient was advised to call  back or seek an in-person evaluation if the symptoms worsen or if the condition fails to improve as anticipated.  I provided 45 minutes of non-face-to-face time during this encounter.   Kori Goins S, LCAS

## 2020-06-24 ENCOUNTER — Ambulatory Visit (INDEPENDENT_AMBULATORY_CARE_PROVIDER_SITE_OTHER): Payer: Medicaid Other | Admitting: Licensed Clinical Social Worker

## 2020-06-24 ENCOUNTER — Encounter (HOSPITAL_COMMUNITY): Payer: Self-pay | Admitting: Licensed Clinical Social Worker

## 2020-06-24 ENCOUNTER — Other Ambulatory Visit: Payer: Self-pay

## 2020-06-24 DIAGNOSIS — F331 Major depressive disorder, recurrent, moderate: Secondary | ICD-10-CM

## 2020-06-24 NOTE — Progress Notes (Signed)
Virtual Visit via Telephone Note  I connected with [name] on 06/24/2020 at 10:00 am by phone enabled telehealth application and verified that I am speaking with the correct person using two identifiers. Patient does not have a computer or the Internet.   I discussed the limitations of evaluation and management by telemedicine and the availability of in person appointments. The patient expressed understanding and agreed to proceed.  LOCATION:  Patient: Home Provider: Home Office    History of Present Illness: Pt was referred for OP therapy by her neurologist Dr.  Lions for depression.   Observations/Objective: Patient presented for today's session on time and was alert, oriented x5, with no evidence or self-report of SI/HI or A/V H.  Patient reported ongoing compliance with medication and denied any use of alcohol or illicit substances. Clinician inquired about patient's current emotional ratings, as well as any significant changes in thoughts, feelings or behavior since previous session.  Patient reported scores of 6/10 for depression, 6/10 for anxiety, 3/10 for anger/irritability. Cln used socratic questions. Patient reports on her current stressors: daughter, grandchildren. Clinician utilized MI OARS to affirm concerns. Clinician challenged thoughts about this. Clinician processed options for communicating her concerns. Cln encouraged pt to  continues with her daily walking and prayer every morning as part of her self-care.     Assessment and plan: Counselor will continue to meet with patient to address treatment plan goals. Patient will continue to follow recommendations of providers and implement skills learned in session.  Follow Up Instructions:   I discussed the assessment and treatment plan with the patient. The patient was provided an opportunity to ask questions and all were answered. The patient agreed with the plan and demonstrated an understanding of the instructions.    The patient was advised to call back or seek an in-person evaluation if the symptoms worsen or if the condition fails to improve as anticipated.  I provided 60 minutes of non-face-to-face time during this encounter.   [name] S, LCAS

## 2020-06-25 ENCOUNTER — Other Ambulatory Visit: Payer: Self-pay

## 2020-06-25 ENCOUNTER — Ambulatory Visit: Payer: Medicaid Other | Admitting: Neurology

## 2020-06-25 ENCOUNTER — Encounter: Payer: Self-pay | Admitting: Neurology

## 2020-06-25 VITALS — BP 133/82 | HR 83 | Ht 62.0 in | Wt 207.2 lb

## 2020-06-25 DIAGNOSIS — R569 Unspecified convulsions: Secondary | ICD-10-CM | POA: Diagnosis not present

## 2020-06-25 MED ORDER — LEVETIRACETAM ER 500 MG PO TB24
ORAL_TABLET | ORAL | 3 refills | Status: DC
  Filled 2020-06-25: qty 270, fill #0
  Filled 2020-10-01: qty 270, 90d supply, fill #0
  Filled 2021-01-07: qty 270, 90d supply, fill #1
  Filled 2021-04-08: qty 270, 90d supply, fill #0

## 2020-06-25 NOTE — Progress Notes (Signed)
NEUROLOGY FOLLOW UP OFFICE NOTE  Ronnald RampRenee Raftery [id number] 07-Aug-1955  HISTORY OF PRESENT ILLNESS: I had the pleasure of seeing Ronnald RampRenee Renk in follow-up in the neurology clinic on 06/25/2020. She is alone in the office today. The patient was last seen a year ago. She has a diagnosis of seizures with recurrent episodes of loss of consciousness with report of right-sided symptoms initially, then she started having symptoms preceded by pain and discomfort in the her extremities. 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 She was started on Keppra at [facility] last 2015 for seizures. She has overall been doing well this past year, saying she is avoiding stress. Her last episode was on 04/14/20 while she was going up a hill on her morning walk. She started feeling dizzy, she did not fall. She was alone and denies any shaking activity. She reports a fall in her apartment in December when she put herself in a fetal position and "I ride it out." She denies any side effects on Levetiracetam ER 500mg  TID. She has noticed her face is having breakouts. She has muscle spasms in her feet, she finds that drinking warm water helps. She denies any neck and back pain. She reports being under a lot of stress currently because her daughter is stressed out. She talks to her therapist regularly.    History on Initial Assessment 11/2014: This is a pleasant 65 yo RH woman with a history of hypertension and a diagnosis of seizures. She reports that neurological symptoms started in 2005 when she would have involuntary movements of the right leg. She states she had lost her grandmother and this had an effect on her, and had symptoms since then. She had seen neurologist Dr. Aletha HalimAndreas Runheim at that time and was diagnosed with focal limb dystomia, told that this may be caused by a chemical imbalance. She was treated with Botox but did not tolerate it. She reports seeing Dr.  Estella Huskunheim from 2006 to 2009, but also at the same time she started having blackouts. She reports that she had a blackout in her doctor's office one time, but instead of being [facility], she was brought to [facility]. She has had blackouts so bad that neighbors would tell her she needed to go to the ER. She usually starts feeling pain in both legs, followed by right-sided shaking, numbness on the left side of her face, throat gets tight, then she falls to the floor. She lives by herself and has woken up on the floor. She reports that she can see herself shaking and would go into a fetal position to "ride it out."   In August 2015, she was brought to the ER due to recent increase in frequency of spells. She reported bilateral numbness and tingling as well as a feeling of discomfort in both upper and lower extremities. She has been told she has convulsions involving her legs. She reported urinary incontinence and tongue soreness. Her BP was very high at that time. 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 No definite gliosis of heterotopic gray matter. Her routine EEG was normal. She was discharged home on Keppra 1000mg  BID which she has been tolerating without side effects. She reports doing well with no similar symptoms until the October 2016. She brings a calendar  of her events, She had one last 10/30/14. She reports she had been stressed because her daughter told her that day that she is moving to [place] to what her mother thinks is an unhealthy relationship. She had another episode on 10/30 while walking. On 11/25/14 she had 2 episodes, one lasting 12 minutes, another one lasting an hour. She reports being in the shower, she grabbed the handle and sat in the tub rocking in a fetal position,  which she found helps. She believes stress is causing the recent seizures and talking to a therapist does help her. She states she does not know how to relax. 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 There is no history of febrile convulsions, CNS infections such as meningitis/encephalitis, significant traumatic brain injury, neurosurgical procedures, or family history of seizures.  Diagnostic Data: She had a 48-hour EEG which was normal. She reported numbness in her right hand and arm, sharp stomach pain, and dizziness. There were no epileptiform discharges seen during these events. The day she reported the dizziness, she did not push the button, but there was note a a transient significant increase in heart rate from 78 bpm to 168 bpm for around 2 minutes, she was not on video at that time. She had a myocardial perfusion scan last 11/2016 which showed an EF of 66%, small defect of moderate severity in the mid anterior and apical anterior location consistent with ischemia, but given normal systolic function, could not rule out breast attenuation artifact. She was started on a beta blocker with improvement in tachycardia, 30-day holter with normal sinus rhythm, no arrhythmia. She has been evaluated by Cardiology.   PAST MEDICAL HISTORY: Past Medical History:  Diagnosis Date  . Anxiety   . Carpal tunnel syndrome   . Diabetes mellitus without complication (HCC)   . Diabetes mellitus, type II (HCC)   . DYSTONIA 01/04/2007   Qualifier: Diagnosis of  By: Humberto Seals NP, Darl Pikes    . Facial droop 10/09/2013  . Hemorrhoid   . Hypertension   . Major depressive disorder, recurrent episode (HCC) 03/18/2006   Qualifier: Diagnosis of  By: Bradly Bienenstock    . Obesity, unspecified 10/17/2007   Qualifier: Diagnosis of  By: Gerilyn Pilgrim PHD, Jeannie    . Prediabetes 10/09/2013  . Rectal bleeding 07/03/2015  . Seizures (HCC)   . Seizures (HCC) 08/24/2013    MEDICATIONS: Current  Outpatient Medications on File Prior to Visit  Medication Sig Dispense Refill  . amLODipine (NORVASC) 10 MG tablet TAKE 1 TABLET (10 MG TOTAL) BY MOUTH DAILY. 90 tablet 1  . aspirin EC 81 MG tablet Take 1 tablet (81 mg total) by mouth daily. 100 tablet 1  . Blood Pressure Monitor DEVI Use as directed to check home blood pressure 2-3 times a week 1 Device 0  . hydrALAZINE (APRESOLINE) 25 MG tablet Take 1 tablet (25 mg total) by mouth 2 (two) times daily. 60 tablet 4  . hydrochlorothiazide (HYDRODIURIL) 25 MG tablet Take 1 tablet (25 mg total) by mouth daily. 90 tablet 3  . levETIRAcetam (KEPPRA XR) 500 MG 24 hr tablet Take 1 tablet three times a day 270 tablet 3  . loratadine (CLARITIN) 10 MG tablet TAKE 1 TABLET (10 MG TOTAL) BY MOUTH DAILY. 60 tablet 2  . metFORMIN (GLUCOPHAGE) 500 MG tablet TAKE 1 TABLET (500 MG TOTAL) BY MOUTH DAILY WITH BREAKFAST. 60 tablet 2  . mirtazapine (REMERON) 30 MG tablet Take 1 tablet (  30 mg total) by mouth at bedtime. 90 tablet 0  . Vitamin D, Ergocalciferol, (DRISDOL) 1.25 MG (50000 UNIT) CAPS capsule TAKE 1 CAPSULE (50,000 UNITS TOTAL) BY MOUTH EVERY 7 (SEVEN) DAYS. 12 capsule 1  . hydrochlorothiazide (HYDRODIURIL) 25 MG tablet TAKE 1 TABLET (25 MG TOTAL) BY MOUTH DAILY. 90 tablet 3  . levETIRAcetam (KEPPRA XR) 500 MG 24 hr tablet TAKE 1 TABLET THREE TIMES A DAY 270 tablet 3  . loratadine (CLARITIN) 10 MG tablet TAKE 1 TABLET (10 MG TOTAL) BY MOUTH DAILY. 60 tablet 2  . metFORMIN (GLUCOPHAGE) 500 MG tablet TAKE 1 TABLET (500 MG TOTAL) BY MOUTH DAILY WITH BREAKFAST. 60 tablet 2  . mirtazapine (REMERON) 30 MG tablet TAKE 1 TABLET (30 MG TOTAL) BY MOUTH AT BEDTIME. 90 tablet 0  . mirtazapine (REMERON) 30 MG tablet TAKE 1 TABLET (30 MG TOTAL) BY MOUTH AT BEDTIME. 90 tablet 1  . Vitamin D, Ergocalciferol, (DRISDOL) 1.25 MG (50000 UNIT) CAPS capsule Take 1 capsule (50,000 Units total) by mouth every 7 (seven) days. 12 capsule 1   No current facility-administered  medications on file prior to visit.    ALLERGIES: Allergies  Allergen Reactions  . Codeine Anaphylaxis  . Shellfish Allergy Swelling and Rash    Throat Sweling  . Apple Cider Vinegar     syncope  . Cinnamon Hives  . Cozaar [Losartan]     Makes her feel like she is having a heart attack  . Lisinopril     cough  . Opium     Interferes with Keppra  . Banana Rash    Cannot eat any while taking KEPPRA  . Catfish [Fish Allergy] Rash  . Orange Fruit [Citrus] Rash    Cannot have while taking KEPPRA  . Tomato Itching and Rash    FAMILY HISTORY: Family History  Problem Relation Age of Onset  . Asthma Mother   . Hypertension Father   . Heart disease Father   . Stroke Father   . Cancer Sister        uterus cancer   . Diabetes Maternal Uncle   . Breast cancer Cousin   . Colon cancer Neg Hx     SOCIAL HISTORY: Social History   Socioeconomic History  . Marital status: Divorced    Spouse name: Not on file  . Number of children: 1  . Years of education: Not on file  . Highest education level: Not on file  Occupational History  . Not on file  Tobacco Use  . Smoking status: Never Smoker  . Smokeless tobacco: Never Used  Vaping Use  . Vaping Use: Never used  Substance and Sexual Activity  . Alcohol use: No    Alcohol/week: 0.0 standard drinks  . Drug use: No  . Sexual activity: Never  Other Topics Concern  . Not on file  Social History Narrative   Right handed    Lives alone    Lives in appartment   Social Determinants of Health   Financial Resource Strain: Not on file  Food Insecurity: Not on file  Transportation Needs: Not on file  Physical Activity: Not on file  Stress: Not on file  Social Connections: Not on file  Intimate Partner Violence: Not on file     PHYSICAL EXAM: Vitals:   06/25/20 0923  BP: 133/82  Pulse: 83  SpO2: 95%   General: No acute distress Head:  Normocephalic/atraumatic Skin/Extremities: No rash, no edema Neurological Exam:  alert and awake. No aphasia  or dysarthria. Fund of knowledge is appropriate.  Recent and remote memory are intact.  Attention and concentration are reduced. Cranial nerves: Pupils equal, round. Extraocular movements intact with no nystagmus. Visual fields full.  No facial asymmetry.  Motor: Bulk and tone normal, muscle strength 5/5 throughout with no pronator drift.   Finger to nose testing intact.  Gait slightly wide-based, no ataxia   IMPRESSION: This is a pleasant 65 yo RH woman with a history of hypertension and recurrent episodes of loss of consciousness with report of right-sided symptoms initially, then symptoms changed, preceded by pain and discomfort in her extremities (LE>UE), or provoked by stress with her daughter. 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 Her 48-hour EEG was normal, there was an episode of dizziness, she was noted to have tachycardia up to 168bpm with no epileptiform correlate seen. She has overall been stable, last dizzy spell was on 04/14/20. Continue Levetiracetam ER 500mg  TID, refills sent. We again discussed avoidance of triggers, particularly stress, continue follow-up with Behavioral Health. We discussed seizure safety with swimming, [place] driving restrictions. Follow-up in 1 year, she knows to call for any changes.    Thank you for allowing me to participate in her care.  Please do not hesitate to call for any questions or concerns.   , M.D.   CC: Dr. Patrcia Dolly

## 2020-06-25 NOTE — Progress Notes (Signed)
04/14/2020 last dizzy spell. Was going up a hill doing a morning walk she didn't fall spell lasted 3 mins,

## 2020-06-25 NOTE — Patient Instructions (Signed)
Always good to see you. Continue Keppra XR 500mg : take 1 tablet three times a day. Follow-up in 1 year, call for any changes.   1. If medication has been prescribed for you to prevent seizures, take it exactly as directed.  Do not stop taking the medicine without talking to your doctor first, even if you have not had a seizure in a long time.   2. Avoid activities in which a seizure would cause danger to yourself or to others.  Don't operate dangerous machinery, swim alone, or climb in high or dangerous places, such as on ladders, roofs, or girders.  Do not drive unless your doctor says you may.  3. If you have any warning that you may have a seizure, lay down in a safe place where you can't hurt yourself.    4.  No driving for 6 months from last seizure, as per [facility].   Please refer to the following link on the Epile[employer] of America's website for more information: http://www.epilepsyfoundation.org/answerplace/Social/driving/drivingu.cfm   5.  Maintain good sleep hygiene.  6.  Contact your doctor if you have any problems that may be related to the medicine you are taking.  7.  Call 911 and bring the patient back to the ED if:        A.  The seizure lasts longer than 5 minutes.       B.  The patient doesn't awaken shortly after the seizure  C.  The patient has new problems such as difficulty seeing, speaking or moving  D.  The patient was injured during the seizure  E.  The patient has a temperature over 102 F (39C)  F.  The patient vomited and now is having trouble breathing

## 2020-07-01 ENCOUNTER — Encounter (HOSPITAL_COMMUNITY): Payer: Self-pay | Admitting: Licensed Clinical Social Worker

## 2020-07-01 ENCOUNTER — Other Ambulatory Visit: Payer: Self-pay

## 2020-07-01 ENCOUNTER — Ambulatory Visit (INDEPENDENT_AMBULATORY_CARE_PROVIDER_SITE_OTHER): Payer: Medicaid Other | Admitting: Licensed Clinical Social Worker

## 2020-07-01 DIAGNOSIS — F331 Major depressive disorder, recurrent, moderate: Secondary | ICD-10-CM

## 2020-07-01 NOTE — Progress Notes (Signed)
Virtual Visit via Telephone Note  I connected with [name] on 07/01/2020 at 10:00 am by phone enabled telehealth application and verified that I am speaking with the correct person using two identifiers. Patient does not have a computer or the Internet.   I discussed the limitations of evaluation and management by telemedicine and the availability of in person appointments. The patient expressed understanding and agreed to proceed.  LOCATION:  Patient: Home Provider: Home Office    History of Present Illness: Pt was referred for OP therapy by her neurologist Dr. [place] Lions for depression.   Observations/Objective: Patient presented for today's session on time and was alert, oriented x5, with no evidence or self-report of SI/HI or A/V H.  Patient reported ongoing compliance with medication and denied any use of alcohol or illicit substances. Clinician inquired about patient's current emotional ratings, as well as any significant changes in thoughts, feelings or behavior since previous session.  Patient reported scores of 6/10 for depression, 6/10 for anxiety, 3/10 for anger/irritability. Pt described her continued stressors: daughter, grandchildren. Clinician and pt explored diversion for pt to use as a coping skill with her daughter who is her biggest stressor. Pt saw her neurologist, Dr.  Lions, who monitors her seizure activity, with her last seizure being in March. Cln and pt role played diversion. Cln encouraged pt to  continues with her daily walking and prayer every morning as part of her self-care.     Assessment and plan: Counselor will continue to meet with patient to address treatment plan goals. Patient will continue to follow recommendations of providers and implement skills learned in session.  Follow Up Instructions:   I discussed the assessment and treatment plan with the patient. The patient was provided an opportunity to ask questions and all were answered. The patient  agreed with the plan and demonstrated an understanding of the instructions.   The patient was advised to call back or seek an in-person evaluation if the symptoms worsen or if the condition fails to improve as anticipated.  I provided 60 minutes of non-face-to-face time during this encounter.   Maebelle Sulton S, LCAS

## 2020-07-02 ENCOUNTER — Other Ambulatory Visit: Payer: Self-pay

## 2020-07-02 ENCOUNTER — Ambulatory Visit: Payer: Medicaid Other | Attending: Internal Medicine | Admitting: Pharmacist

## 2020-07-02 ENCOUNTER — Other Ambulatory Visit: Payer: Self-pay | Admitting: Internal Medicine

## 2020-07-02 VITALS — BP 126/77 | HR 64

## 2020-07-02 DIAGNOSIS — I10 Essential (primary) hypertension: Secondary | ICD-10-CM

## 2020-07-02 MED ORDER — METFORMIN HCL 500 MG PO TABS
ORAL_TABLET | Freq: Every day | ORAL | 2 refills | Status: DC
  Filled 2020-07-02: qty 60, fill #0
  Filled 2020-08-13: qty 90, 90d supply, fill #0
  Filled 2021-01-07: qty 90, 90d supply, fill #1

## 2020-07-02 NOTE — Progress Notes (Signed)
    S:    PCP: Dr. Laural Benes  Patient arrives in good spirits. Presents to the clinic for hypertension evaluation, counseling, and management. Patient was referred and last seen by Primary Care Provider on 04/23/2020. Last seen by clinical pharmacist on 05/21/2020. We made no changes but encouraged her to take AM bP medications before her appointments in the future.   Today, patient reports great adherence to prescribed medications. Denies dizziness, headaches or chest pains. She is no longer spacing her medications and she took amlodipine, HCTZ and her AM dose of the hydralazine before her visit today.   Current BP Medications include:  Amlodipine 10 mg daily, HCTZ 25 mg daily, hydralazine 25 mg BID  Dietary habits include: tries to limit salt; denies coffee or caffeine  Exercise habits include: limited  Family / Social history:  - FHx: asthma, HTN, heart disease, stroke  - Tobacco: never smoker  - Alcohol: denies use   O:  Vitals:   07/02/20 1100  BP: 126/77  Pulse: 64   Home BP readings: none  Last 3 Office BP readings: BP Readings from Last 3 Encounters:  07/02/20 126/77  06/25/20 133/82  05/21/20 (!) 144/83    BMET    Component Value Date/Time   NA 143 12/26/2019 0949   K 3.7 12/26/2019 0949   CL 103 12/26/2019 0949   CO2 26 12/26/2019 0949   GLUCOSE 84 12/26/2019 0949   GLUCOSE 86 02/18/2016 0948   BUN 16 12/26/2019 0949   CREATININE 1.01 (H) 12/26/2019 0949   CREATININE 0.89 02/18/2016 0948   CALCIUM 10.1 12/26/2019 0949   GFRNONAA 60 12/26/2019 0949   GFRNONAA 73 08/17/2014 1013   GFRAA 69 12/26/2019 0949   GFRAA 84 08/17/2014 1013    Renal function: CrCl cannot be calculated (Patient's most recent lab result is older than the maximum 21 days allowed.).  Clinical ASCVD: No  The 10-year ASCVD risk score Denman George DC Jr., et al., 2013) is: 12.5%   Values used to calculate the score:     Age: 65 years     Sex: Female     Is Non-Hispanic African American: Yes      Diabetic: Yes     Tobacco smoker: No     Systolic Blood Pressure: 126 mmHg     Is BP treated: Yes     HDL Cholesterol: 48 mg/dL     Total Cholesterol: 134 mg/dL  A/P: Hypertension longstanding currently at  goal. BP Goal = < 130/80 mmHg. Medication adherence reported.   -Continued current regimen with amlodipine, hctz and hydralazine  -Counseled on lifestyle modifications for blood pressure control including reduced dietary sodium, increased exercise, adequate sleep.  Results reviewed and written information provided.   Total time in face-to-face counseling 30 minutes.   F/U Pharmacist visit in 2 weeks for shingles shot.   Butch Penny, PharmD, Patsy Baltimore, CPP Clinical Pharmacist [facility] [phone]

## 2020-07-02 NOTE — Patient Instructions (Signed)
Thank you for coming to see us today.   Blood pressure today is well-controlled! Great job!  Continue taking blood pressure medications as prescribed.   Limiting salt and caffeine, as well as exercising as able for at least 30 minutes for 5 days out of the week, can also help you lower your blood pressure.  Take your blood pressure at home if you are able. Please write down these numbers and bring them to your visits.  If you have any questions about medications, please call me [phone].  Luke  

## 2020-07-08 ENCOUNTER — Other Ambulatory Visit: Payer: Self-pay

## 2020-07-08 ENCOUNTER — Ambulatory Visit (INDEPENDENT_AMBULATORY_CARE_PROVIDER_SITE_OTHER): Payer: Medicaid Other | Admitting: Licensed Clinical Social Worker

## 2020-07-08 ENCOUNTER — Encounter (HOSPITAL_COMMUNITY): Payer: Self-pay | Admitting: Licensed Clinical Social Worker

## 2020-07-08 DIAGNOSIS — F331 Major depressive disorder, recurrent, moderate: Secondary | ICD-10-CM | POA: Diagnosis not present

## 2020-07-08 NOTE — Progress Notes (Signed)
Virtual Visit via Telephone Note  I connected with [name] on 07/08/2020 at 10:00 am by phone enabled telehealth application and verified that I am speaking with the correct person using two identifiers. Patient does not have a computer or the Internet.   I discussed the limitations of evaluation and management by telemedicine and the availability of in person appointments. The patient expressed understanding and agreed to proceed.  LOCATION:  Patient: Home Provider: Home Office    History of Present Illness: Pt was referred for OP therapy by her neurologist Dr. Warren Lions for depression.   Observations/Objective: Patient presented for today's session on time and was alert, oriented x5, with no evidence or self-report of SI/HI or A/V H.  Patient reported ongoing compliance with medication and denied any use of alcohol or illicit substances. Clinician inquired about patient's current emotional ratings, as well as any significant changes in thoughts, feelings or behavior since previous session.  Patient reported scores of 6/10 for depression, 6/10 for anxiety, 3/10 for anger/irritability. Pt described her continued stressors: daughter, grandchildren. Pt reports that within the last week she has not experienced any stress from her daughter/grandchildren. "I haven't seen them so I have not experienced any stress. It has been wonderful!" Cln explored with pt the feelings of a stressfree life. Cln encouraged pt to  continues with her daily walking and prayer every morning as part of her self-care. Pt had to end session early due to taking bus to dentist.     Assessment and plan: Counselor will continue to meet with patient to address treatment plan goals. Patient will continue to follow recommendations of providers and implement skills learned in session.  Follow Up Instructions:   I discussed the assessment and treatment plan with the patient. The patient was provided an opportunity to ask  questions and all were answered. The patient agreed with the plan and demonstrated an understanding of the instructions.   The patient was advised to call back or seek an in-person evaluation if the symptoms worsen or if the condition fails to improve as anticipated.  I provided 30 minutes of non-face-to-face time during this encounter.   Maxton Noreen S, LCAS

## 2020-07-15 ENCOUNTER — Encounter (HOSPITAL_COMMUNITY): Payer: Self-pay | Admitting: Licensed Clinical Social Worker

## 2020-07-15 ENCOUNTER — Ambulatory Visit (INDEPENDENT_AMBULATORY_CARE_PROVIDER_SITE_OTHER): Payer: Medicaid Other | Admitting: Licensed Clinical Social Worker

## 2020-07-15 ENCOUNTER — Other Ambulatory Visit: Payer: Self-pay

## 2020-07-15 DIAGNOSIS — F331 Major depressive disorder, recurrent, moderate: Secondary | ICD-10-CM

## 2020-07-15 NOTE — Progress Notes (Signed)
Virtual Visit via Telephone Note  I connected with [name] on 07/15/2020 at 10:00 am by phone enabled telehealth application and verified that I am speaking with the correct person using two identifiers. Patient does not have a computer or the Internet.   I discussed the limitations of evaluation and management by telemedicine and the availability of in person appointments. The patient expressed understanding and agreed to proceed.  LOCATION:  Patient: Home Provider: Home Office    History of Present Illness: Pt was referred for OP therapy by her neurologist Dr. [place] Lions for depression.   Observations/Objective: Patient presented for today's session on time and was alert, oriented x5, with no evidence or self-report of SI/HI or A/V H.  Patient reported ongoing compliance with medication and denied any use of alcohol or illicit substances. Clinician inquired about patient's current emotional ratings, as well as any significant changes in thoughts, feelings or behavior since previous session.  Patient reported scores of 6/10 for depression, 6/10 for anxiety, 2/10 for anger/irritability. Pt described her continued stressors: daughter, grandchildren. Pt reports that within the last week she has not experienced any stress from her daughter/grandchildren. "I haven't seen them so I have not experienced any stress."  Pt discussed her plans for the 4th of July, trying to decide whether to see her family or not. Cln and pt role played possible scenarios and coping skills available for her to use. Cln encouraged pt to  continues with her daily walking and prayer every morning as part of her self-care.     Assessment and plan: Counselor will continue to meet with patient to address treatment plan goals. Patient will continue to follow recommendations of providers and implement skills learned in session.  Follow Up Instructions:   I discussed the assessment and treatment plan with the patient. The  patient was provided an opportunity to ask questions and all were answered. The patient agreed with the plan and demonstrated an understanding of the instructions.   The patient was advised to call back or seek an in-person evaluation if the symptoms worsen or if the condition fails to improve as anticipated.  I provided 40 minutes of non-face-to-face time during this encounter.   [name] S, LCAS

## 2020-07-16 ENCOUNTER — Ambulatory Visit: Payer: Medicaid Other | Attending: Internal Medicine | Admitting: Pharmacist

## 2020-07-16 ENCOUNTER — Other Ambulatory Visit: Payer: Self-pay

## 2020-07-16 VITALS — BP 132/82

## 2020-07-16 DIAGNOSIS — Z23 Encounter for immunization: Secondary | ICD-10-CM

## 2020-07-16 NOTE — Progress Notes (Signed)
Patient presents for vaccination against zoster per orders of Dr. Johnson. Consent given. Counseling provided. No contraindications exists. Vaccine administered without incident.   Luke Van Ausdall, PharmD, BCACP, CPP Clinical Pharmacist [facility] [phone]  

## 2020-07-18 ENCOUNTER — Other Ambulatory Visit: Payer: Self-pay

## 2020-07-18 ENCOUNTER — Telehealth (INDEPENDENT_AMBULATORY_CARE_PROVIDER_SITE_OTHER): Payer: Medicaid Other | Admitting: Psychiatry

## 2020-07-18 DIAGNOSIS — F331 Major depressive disorder, recurrent, moderate: Secondary | ICD-10-CM

## 2020-07-18 DIAGNOSIS — F5101 Primary insomnia: Secondary | ICD-10-CM | POA: Diagnosis not present

## 2020-07-18 MED ORDER — MIRTAZAPINE 30 MG PO TABS
30.0000 mg | ORAL_TABLET | Freq: Every day | ORAL | 0 refills | Status: DC
  Filled 2020-07-18 – 2020-09-20 (×2): qty 90, 90d supply, fill #0
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
## 2020-07-29 ENCOUNTER — Encounter (HOSPITAL_COMMUNITY): Payer: Self-pay | Admitting: Licensed Clinical Social Worker

## 2020-07-29 ENCOUNTER — Ambulatory Visit (INDEPENDENT_AMBULATORY_CARE_PROVIDER_SITE_OTHER): Payer: Medicaid Other | Admitting: Licensed Clinical Social Worker

## 2020-07-29 ENCOUNTER — Other Ambulatory Visit: Payer: Self-pay

## 2020-07-29 DIAGNOSIS — F331 Major depressive disorder, recurrent, moderate: Secondary | ICD-10-CM | POA: Diagnosis not present

## 2020-07-29 NOTE — Progress Notes (Signed)
Virtual Visit via Telephone Note  I connected with [name] on 07/29/2020 at 10:00 am by phone enabled telehealth application and verified that I am speaking with the correct person using two identifiers. Patient does not have a computer or Internet.   I discussed the limitations of evaluation and management by telemedicine and the availability of in person appointments. The patient expressed understanding and agreed to proceed.  LOCATION:  Patient: Home Provider: Home Office    History of Present Illness: Pt was referred for OP therapy by her neurologist Dr. [place] Lions for depression.   Observations/Objective: Patient presented for today's session on time and was alert, oriented x5, with no evidence or self-report of SI/HI or A/V H.  Patient reported ongoing compliance with medication and denied any use of alcohol or illicit substances. Clinician inquired about patient's current emotional ratings, as well as any significant changes in thoughts, feelings or behavior since previous session.  Patient reported scores of 6/10 for depression, 6/10 for anxiety, 2/10 for anger/irritability. Pt reports her [employer] building may be sold. "This concerns me."  Pt described her continued stressors: daughter, grandchildren. Pt reports that within the last week she did not experience any stress. Clinician utilized MI OARS to reflect and summarize thoughts and feelings. Pt discussed her 4th of July plans with her family. "I enjoyed the time with my family, no stress, everyone got along."  Cln encouraged pt to continues with her daily walking and prayer every morning as part of her self-care.      Assessment and plan: Counselor will continue to meet with patient to address treatment plan goals. Patient will continue to follow recommendations of providers and implement skills learned in session.  Follow Up Instructions:   I discussed the assessment and treatment plan with the patient. The patient was provided an  opportunity to ask questions and all were answered. The patient agreed with the plan and demonstrated an understanding of the instructions.   The patient was advised to call back or seek an in-person evaluation if the symptoms worsen or if the condition fails to improve as anticipated.  I provided 45 minutes of non-face-to-face time during this encounter.   Orvill Coulthard S, LCAS

## 2020-07-30 ENCOUNTER — Encounter: Payer: Self-pay | Admitting: Podiatry

## 2020-07-30 ENCOUNTER — Ambulatory Visit: Payer: Medicaid Other | Admitting: Podiatry

## 2020-07-30 ENCOUNTER — Other Ambulatory Visit: Payer: Self-pay

## 2020-07-30 DIAGNOSIS — E119 Type 2 diabetes mellitus without complications: Secondary | ICD-10-CM

## 2020-07-30 DIAGNOSIS — M79675 Pain in left toe(s): Secondary | ICD-10-CM

## 2020-07-30 DIAGNOSIS — M79674 Pain in right toe(s): Secondary | ICD-10-CM | POA: Diagnosis not present

## 2020-07-30 DIAGNOSIS — B351 Tinea unguium: Secondary | ICD-10-CM | POA: Diagnosis not present

## 2020-08-05 ENCOUNTER — Other Ambulatory Visit: Payer: Self-pay

## 2020-08-05 ENCOUNTER — Ambulatory Visit (INDEPENDENT_AMBULATORY_CARE_PROVIDER_SITE_OTHER): Payer: Medicaid Other | Admitting: Licensed Clinical Social Worker

## 2020-08-05 DIAGNOSIS — F331 Major depressive disorder, recurrent, moderate: Secondary | ICD-10-CM

## 2020-08-05 NOTE — Progress Notes (Signed)
Subjective: 65 year old female presents the office with concerns of thick, discolored toenails that she cannot trim her self.  They are causing discomfort most with pressure in with shoes.  She has type 2 diabetes.  Last A1c I can see is 6.1.  She does have neuropathy she reports.  She is getting numbness and tingling to her toes.  Denies any systemic complaints such as fevers, chills, nausea, vomiting. No acute changes since last appointment, and no other complaints at this time.   Objective: AAO x3, NAD DP/PT pulses palpable bilaterally, CRT less than 3 seconds Nails are hypertrophic, dystrophic, brittle, discolored, elongated 10. No surrounding redness or drainage. Tenderness nails 1-5 bilaterally. No open lesions or pre-ulcerative lesions are identified today. No pain with calf compression, swelling, warmth, erythema  Assessment: Type 2 diabetes with neuropathy, symptomatic onychomycosis  Plan: -All treatment options discussed with the patient including all alternatives, risks, complications.  -Sharply debrided nails x10 without any complications or bleeding -Discussed daily foot inspection -We discussed primary care about possibly starting gabapentin -Patient encouraged to call the office with any questions, concerns, change in symptoms.   Vivi Barrack DPM

## 2020-08-08 ENCOUNTER — Encounter (HOSPITAL_COMMUNITY): Payer: Self-pay | Admitting: Licensed Clinical Social Worker

## 2020-08-08 NOTE — Progress Notes (Signed)
Virtual Visit via Telephone Note  I connected with [name] on 08/05/2020 at 10:00 am by phone enabled telehealth application and verified that I am speaking with the correct person using two identifiers. Patient does not have a computer or Internet.   I discussed the limitations of evaluation and management by telemedicine and the availability of in person appointments. The patient expressed understanding and agreed to proceed.  LOCATION:  Patient: Home Provider: Home Office    History of Present Illness: Pt was referred for OP therapy by her neurologist Dr. [place] Lions for depression.   Observations/Objective: Patient presented for today's session on time and was alert, oriented x5, with no evidence or self-report of SI/HI or A/V H.  Patient reported ongoing compliance with medication and denied any use of alcohol or illicit substances. Clinician inquired about patient's current emotional ratings, as well as any significant changes in thoughts, feelings or behavior since previous session.  Patient reported scores of 6/10 for depression, 6/10 for anxiety, 2/10 for anger/irritability. Patient reports on her biggest stressor: daughter and grandchildren. "I've been babysitting my grandchildren while my daughter worked." Cln responded with concern pointing out prolonged interaction with her grandchildren causes stress and potential seizure. Clinician utilized MI OARS to reflect and summarize thoughts and feelings. Cln encouraged pt to continues with her daily walking and prayer every morning as part of her self-care.      Assessment and plan: Counselor will continue to meet with patient to address treatment plan goals. Patient will continue to follow recommendations of providers and implement skills learned in session.  Follow Up Instructions:   I discussed the assessment and treatment plan with the patient. The patient was provided an opportunity to ask questions and all were answered. The  patient agreed with the plan and demonstrated an understanding of the instructions.   The patient was advised to call back or seek an in-person evaluation if the symptoms worsen or if the condition fails to improve as anticipated.  I provided 45 minutes of non-face-to-face time during this encounter.   Hoa Briggs S, LCAS

## 2020-08-12 ENCOUNTER — Encounter (HOSPITAL_COMMUNITY): Payer: Self-pay | Admitting: Licensed Clinical Social Worker

## 2020-08-12 ENCOUNTER — Ambulatory Visit (INDEPENDENT_AMBULATORY_CARE_PROVIDER_SITE_OTHER): Payer: Medicaid Other | Admitting: Licensed Clinical Social Worker

## 2020-08-12 ENCOUNTER — Other Ambulatory Visit: Payer: Self-pay

## 2020-08-12 DIAGNOSIS — F331 Major depressive disorder, recurrent, moderate: Secondary | ICD-10-CM

## 2020-08-12 NOTE — Progress Notes (Signed)
Virtual Visit via Telephone Note  I connected with [name] on 08/12/2020 at 10:00 am by phone enabled telehealth application and verified that I am speaking with the correct person using two identifiers. Patient does not have a computer or Internet.   I discussed the limitations of evaluation and management by telemedicine and the availability of in person appointments. The patient expressed understanding and agreed to proceed.  LOCATION:  Patient: Home Provider: Home Office    History of Present Illness: Pt was referred for OP therapy by her neurologist Dr. [place] Lions for depression.   Observations/Objective: Patient presented for today's session on time and was alert, oriented x5, with no evidence or self-report of SI/HI or A/V H.  Patient reported ongoing compliance with medication and denied any use of alcohol or illicit substances. Clinician inquired about patient's current emotional ratings, as well as any significant changes in thoughts, feelings or behavior since previous session.  Patient reported scores of 6/10 for depression, 6/10 for anxiety, 4/10 for anger/irritability. Patient reports on her biggest stressor: daughter and grandchildren.  "I shaved my head last night." Pt has a hx of Trichotillomania. Cln explored with pt's stressors which caused her to start pulling out her hair. "I've been babysitting my grandchildren while my daughter worked." Cln responded with concern pointing out prolonged interaction with her grandchildren causes stress and potential seizure. Cln taught pt mindfulness skills to use for her increased anxiety and depression. Cln encouraged pt to continues with her daily walking and prayer every morning as part of her self-care.      Assessment and plan: Counselor will continue to meet with patient to address treatment plan goals. Patient will continue to follow recommendations of providers and implement skills learned in session.  Follow Up Instructions:    I discussed the assessment and treatment plan with the patient. The patient was provided an opportunity to ask questions and all were answered. The patient agreed with the plan and demonstrated an understanding of the instructions.   The patient was advised to call back or seek an in-person evaluation if the symptoms worsen or if the condition fails to improve as anticipated.  I provided 60 minutes of non-face-to-face time during this encounter.   Lautaro Koral S, LCAS

## 2020-08-13 ENCOUNTER — Other Ambulatory Visit: Payer: Self-pay

## 2020-08-13 ENCOUNTER — Encounter: Payer: Self-pay | Admitting: Registered"

## 2020-08-13 ENCOUNTER — Encounter: Payer: Medicaid Other | Attending: Internal Medicine | Admitting: Registered"

## 2020-08-13 DIAGNOSIS — E669 Obesity, unspecified: Secondary | ICD-10-CM | POA: Diagnosis present

## 2020-08-13 NOTE — Progress Notes (Signed)
  Medical Nutrition Therapy  Appointment Start time:  10:08  Appointment End time:  11:05  Primary concerns today: help with getting the weight off  Referral diagnosis: obesity Preferred learning style: no preference indicated Learning readiness: ready, change in progress   NUTRITION ASSESSMENT   Pt reports she needs help with food allergies. States she wants to lose weight and it is a heavy burden on her.  States her recent weight was 204 lbs and wants help getting the weight off.   Reports she has a bunyan on her right foot and has not been able to walk for exercise within the last 2 weeks.   States she is lactose intolerant. States she loves black beans, green beans but does not like corn. States she as gotten rid of everything white in her cabinets - sugar, salt, flour. Reports using Mrs. Dash and prefers oven baked chicken. States she tries not to eat after 7 pm daily. States she was staying with daughter and 3 grandchildren (ages 82, 71, 34) for a few weeks to help with childcare during the summer. States this is throwing on her eating schedule off. States since being back home the last few days she's mainly been wanting to rest and eating schedule is still "off".    Clinical Medical Hx: brain disorder, HTN, prediabetes Medications: See list Labs: elevated A1C (5.9) Notable Signs/Symptoms: none reported  Lifestyle & Dietary Hx  Estimated daily fluid intake: 64 oz Supplements: See list Sleep: 6 hrs/night Stress / self-care: walking sometimes Current average weekly physical activity: walking and sit-ups 30 min, 6x/week  24-Hr Dietary Recall Food allergies: shellfish, catfish, banana, tomato, oranges, rice First Meal: skipped or pasta + vegetables Snack:  Second Meal: skipped Snack:  Third Meal (7 pm): pasta + vegetables  Snack:  Beverages: water (4*16 oz; 64 oz)   NUTRITION DIAGNOSIS  NB-1.1 Food and nutrition-related knowledge deficit As related to balanced meals.  As  evidenced by dietary recall.   NUTRITION INTERVENTION  Nutrition education (E-1) on the following topics: Nutrition education and counseling. Pt was educated on the benefits of eating a variety of food groups at each meal. Discussed the purpose of each food group and ways to create balance with already established regimen. Discussed eating every 3-5 hours to help adequately nourish body and ways to increase water intake. Discussed importance of physical activity. Pt agreed with goals listed.   Handouts Provided Include  My Plate for Prediabetes Meal Plans  Learning Style & Readiness for Change Teaching method utilized: Visual & Auditory  Demonstrated degree of understanding via: Teach Back  Barriers to learning/adherence to lifestyle change: none identified  Goals Established by Pt Aim to have breakfast within 1-2 hours of waking up.  Aim to eat about every 3-5 hours.  Breakfast 5 am Snack 8 am Lunch 11-12 pm Snack 3 pm  Dinner 5 pm   Snacks to include: Fruit and peanut butter Peanut butter crackers Cheese and crackers Granola bars  Fruit and cashews Trail mix Balance meals with 1/4 plate lean protein, 1/2 plate of non-starchy vegetables, and 1/4 plate of carbohydrates.   MONITORING & EVALUATION Dietary intake, weekly physical activity.  Next Steps  Patient is to follow-up in 2 months.

## 2020-08-13 NOTE — Patient Instructions (Addendum)
-   Aim to have breakfast within 1-2 hours of waking up.   - Aim to eat about every 3-5 hours.  Breakfast 5 am Snack 8 am Lunch 11-12 pm Snack 3 pm  Dinner 5 pm   - Snacks to include: Fruit and peanut butter Peanut butter crackers Cheese and crackers Granola bars  Fruit and cashews Trail mix  - Balance meals with 1/4 plate lean protein, 1/2 plate of non-starchy vegetables, and 1/4 plate of carbohydrates.

## 2020-08-19 ENCOUNTER — Encounter (HOSPITAL_COMMUNITY): Payer: Self-pay | Admitting: Licensed Clinical Social Worker

## 2020-08-19 ENCOUNTER — Ambulatory Visit (INDEPENDENT_AMBULATORY_CARE_PROVIDER_SITE_OTHER): Payer: Medicaid Other | Admitting: Licensed Clinical Social Worker

## 2020-08-19 ENCOUNTER — Other Ambulatory Visit: Payer: Self-pay

## 2020-08-19 DIAGNOSIS — F331 Major depressive disorder, recurrent, moderate: Secondary | ICD-10-CM | POA: Diagnosis not present
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
## 2020-08-26 ENCOUNTER — Other Ambulatory Visit: Payer: Self-pay

## 2020-08-26 ENCOUNTER — Ambulatory Visit (INDEPENDENT_AMBULATORY_CARE_PROVIDER_SITE_OTHER): Payer: Medicaid Other | Admitting: Licensed Clinical Social Worker

## 2020-08-26 ENCOUNTER — Encounter (HOSPITAL_COMMUNITY): Payer: Self-pay | Admitting: Licensed Clinical Social Worker

## 2020-08-26 DIAGNOSIS — F331 Major depressive disorder, recurrent, moderate: Secondary | ICD-10-CM | POA: Diagnosis not present
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
## 2020-08-27 ENCOUNTER — Ambulatory Visit: Payer: Medicaid Other | Attending: Internal Medicine | Admitting: Internal Medicine

## 2020-08-27 ENCOUNTER — Other Ambulatory Visit: Payer: Self-pay

## 2020-08-27 ENCOUNTER — Encounter: Payer: Self-pay | Admitting: Internal Medicine

## 2020-08-27 VITALS — BP 168/92 | HR 74 | Resp 16 | Wt 212.6 lb

## 2020-08-27 DIAGNOSIS — Z8249 Family history of ischemic heart disease and other diseases of the circulatory system: Secondary | ICD-10-CM | POA: Insufficient documentation

## 2020-08-27 DIAGNOSIS — I10 Essential (primary) hypertension: Secondary | ICD-10-CM

## 2020-08-27 DIAGNOSIS — G40909 Epilepsy, unspecified, not intractable, without status epilepticus: Secondary | ICD-10-CM | POA: Insufficient documentation

## 2020-08-27 DIAGNOSIS — Z7982 Long term (current) use of aspirin: Secondary | ICD-10-CM | POA: Insufficient documentation

## 2020-08-27 DIAGNOSIS — R7303 Prediabetes: Secondary | ICD-10-CM | POA: Diagnosis not present

## 2020-08-27 DIAGNOSIS — F331 Major depressive disorder, recurrent, moderate: Secondary | ICD-10-CM | POA: Diagnosis not present

## 2020-08-27 DIAGNOSIS — Z885 Allergy status to narcotic agent status: Secondary | ICD-10-CM | POA: Insufficient documentation

## 2020-08-27 DIAGNOSIS — E119 Type 2 diabetes mellitus without complications: Secondary | ICD-10-CM | POA: Insufficient documentation

## 2020-08-27 DIAGNOSIS — Z7984 Long term (current) use of oral hypoglycemic drugs: Secondary | ICD-10-CM | POA: Insufficient documentation

## 2020-08-27 DIAGNOSIS — Z888 Allergy status to other drugs, medicaments and biological substances status: Secondary | ICD-10-CM | POA: Diagnosis not present

## 2020-08-27 DIAGNOSIS — Z6838 Body mass index (BMI) 38.0-38.9, adult: Secondary | ICD-10-CM | POA: Diagnosis not present

## 2020-08-27 LAB — POCT GLYCOSYLATED HEMOGLOBIN (HGB A1C): HbA1c, POC (prediabetic range): 5.7 % (ref 5.7–6.4)

## 2020-08-27 LAB — GLUCOSE, POCT (MANUAL RESULT ENTRY): POC Glucose: 109 mg/dl — AB (ref 70–99)

## 2020-08-27 MED ORDER — HYDRALAZINE HCL 50 MG PO TABS
50.0000 mg | ORAL_TABLET | Freq: Two times a day (BID) | ORAL | 6 refills | Status: DC
  Filled 2020-08-27: qty 60, 30d supply, fill #0
  Filled 2020-11-18: qty 60, 30d supply, fill #1
  Filled 2021-01-07: qty 180, 90d supply, fill #2

## 2020-08-27 NOTE — Patient Instructions (Signed)
Your blood pressure is not at goal.  Goal is 130/80 or lower.  We have increased the hydralazine to 50 mg twice a day.  Follow-up with the clinical pharmacist in 2 weeks for repeat blood pressure check.  Bring your blood pressure monitoring device with you so that he can show you how to use it.
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
## 2020-09-02 ENCOUNTER — Other Ambulatory Visit: Payer: Self-pay

## 2020-09-02 ENCOUNTER — Encounter (HOSPITAL_COMMUNITY): Payer: Self-pay | Admitting: Licensed Clinical Social Worker

## 2020-09-02 ENCOUNTER — Ambulatory Visit (INDEPENDENT_AMBULATORY_CARE_PROVIDER_SITE_OTHER): Payer: Medicaid Other | Admitting: Licensed Clinical Social Worker

## 2020-09-02 DIAGNOSIS — F331 Major depressive disorder, recurrent, moderate: Secondary | ICD-10-CM | POA: Diagnosis not present

## 2020-09-02 NOTE — Progress Notes (Addendum)
Virtual Visit via Telephone Note  I connected with [name] on 09/02/2020 at 10:00 am by phone enabled telehealth application and verified that I am speaking with the correct person using two identifiers. Patient does not have a computer or Internet.   I discussed the limitations of evaluation and management by telemedicine and the availability of in person appointments. The patient expressed understanding and agreed to proceed.  LOCATION:  Patient: Home Provider: Home Office    History of Present Illness: Pt was referred for OP therapy by her neurologist Dr. [place] Lions for depression.   Observations/Objective: Patient presented for today's session on time and was alert, oriented x5, with no evidence or self-report of SI/HI or A/V H.  Patient reported ongoing compliance with medication and denied any use of alcohol or illicit substances. Clinician inquired about patient's current emotional ratings, as well as any significant changes in thoughts, feelings or behavior since previous session.  Patient reported scores of 4/10 for depression, 4/10 for anxiety, 2/10 for anger/irritability.  Patient reports on her biggest stressor: daughter and grandchildren.  Pt continues to reports she has blocked her daughter and grandchildren's phones she has had no contact with them in the past 3 weeks.  Pt has a hx of Trichotillomania., but reports her hair is continuing to grow back since her stress has been less over the past 3 weeks.  Cln reviewed tx plan with pt who verbally accepted plan. Cln reviewed relaxing skills and grounding skills including mindful breathing, progressive muscle relaxation and positive visualizations. Cln and pt explored positive visualizations "happy place." Cln encouraged pt to continues with her daily walking (safely) and prayer every morning as part of her self-care. Pt is continuing to consider moving to [facility] to be near her sister, who is supportive. Cln and pt explored the  benefits/risks to moving. Will continue this discussion in future sessions.    Assessment and plan: Counselor will continue to meet with patient to address treatment plan goals. Patient will continue to follow recommendations of providers and implement skills learned in session.  Follow Up Instructions:   I discussed the assessment and treatment plan with the patient. The patient was provided an opportunity to ask questions and all were answered. The patient agreed with the plan and demonstrated an understanding of the instructions.   The patient was advised to call back or seek an in-person evaluation if the symptoms worsen or if the condition fails to improve as anticipated.  I provided 60 minutes of non-face-to-face time during this encounter.   Darryle Dennie S, LCAS

## 2020-09-09 ENCOUNTER — Ambulatory Visit (INDEPENDENT_AMBULATORY_CARE_PROVIDER_SITE_OTHER): Payer: Medicaid Other | Admitting: Licensed Clinical Social Worker

## 2020-09-09 ENCOUNTER — Other Ambulatory Visit: Payer: Self-pay

## 2020-09-09 ENCOUNTER — Encounter (HOSPITAL_COMMUNITY): Payer: Self-pay | Admitting: Licensed Clinical Social Worker

## 2020-09-09 DIAGNOSIS — F331 Major depressive disorder, recurrent, moderate: Secondary | ICD-10-CM | POA: Diagnosis not present

## 2020-09-09 NOTE — Progress Notes (Signed)
Virtual Visit via Telephone Note  I connected with [name] on 09/09/2020 at 10:00 am by phone enabled telehealth application and verified that I am speaking with the correct person using two identifiers. Patient does not have a computer or Internet.   I discussed the limitations of evaluation and management by telemedicine and the availability of in person appointments. The patient expressed understanding and agreed to proceed.  LOCATION:  Patient: Home Provider: Home Office    History of Present Illness: Pt was referred for OP therapy by her neurologist Dr. [place] Lions for depression.   Observations/Objective: Patient presented for today's session on time and was alert, oriented x5, with no evidence or self-report of SI/HI or A/V H.  Patient reported ongoing compliance with medication and denied any use of alcohol or illicit substances. Clinician inquired about patient's current emotional ratings, as well as any significant changes in thoughts, feelings or behavior since previous session.  Patient reported scores of 5/10 for depression, 5/10 for anxiety, 3/10 for anger/irritability.  Patient reports on her biggest stressor: daughter and grandchildren.  Pt continues to reports she has blocked her daughter and grandchildren's phones she has had no contact with them in the past 4 weeks. "I'm just concerned about my grandchildren and my daughter is not making good decisions."  Clinician utilized MI OARS to reflect and summarize thoughts and feelings. Pt reports her hair is continuing to grow back since her stress has been less over the past 4 weeks. Again, Cln reviewed relaxing skills and grounding skills including mindful breathing, progressive muscle relaxation and positive visualizations.  Cln encouraged pt to continues with her self-care.   Assessment and plan: Counselor will continue to meet with patient to address treatment plan goals. Patient will continue to follow recommendations of  providers and implement skills learned in session.  Follow Up Instructions:   I discussed the assessment and treatment plan with the patient. The patient was provided an opportunity to ask questions and all were answered. The patient agreed with the plan and demonstrated an understanding of the instructions.   The patient was advised to call back or seek an in-person evaluation if the symptoms worsen or if the condition fails to improve as anticipated.  I provided 45 minutes of non-face-to-face time during this encounter.   Jesiah Yerby S, LCAS

## 2020-09-16 ENCOUNTER — Other Ambulatory Visit: Payer: Self-pay

## 2020-09-16 ENCOUNTER — Ambulatory Visit (INDEPENDENT_AMBULATORY_CARE_PROVIDER_SITE_OTHER): Payer: Medicaid Other | Admitting: Licensed Clinical Social Worker

## 2020-09-16 ENCOUNTER — Encounter (HOSPITAL_COMMUNITY): Payer: Self-pay | Admitting: Licensed Clinical Social Worker

## 2020-09-16 DIAGNOSIS — F331 Major depressive disorder, recurrent, moderate: Secondary | ICD-10-CM

## 2020-09-16 NOTE — Progress Notes (Signed)
Virtual Visit via Telephone Note  I connected with [name] on 09/16/2000 at 10:00 am by phone enabled telehealth application and verified that I am speaking with the correct person using two identifiers. Patient does not have a computer or Internet.   I discussed the limitations of evaluation and management by telemedicine and the availability of in person appointments. The patient expressed understanding and agreed to proceed.  LOCATION:  Patient: Home Provider: Home Office    History of Present Illness: Pt was referred for OP therapy by her neurologist Dr. [place] Lions for depression.   Observations/Objective: Patient presented for today's session on time and was alert, oriented x5, with no evidence or self-report of SI/HI or A/V H.  Patient reported ongoing compliance with medication and denied any use of alcohol or illicit substances. Clinician inquired about patient's current emotional ratings, as well as any significant changes in thoughts, feelings or behavior since previous session.  Patient reported scores of 6/10 for depression, 6/10 for anxiety, 3/10 for anger/irritability.  Patient reports on her biggest stressor: daughter and grandchildren.  Pt continues to reports she has blocked her daughter and grandchildren's phones she has had no contact with them in the past 5 weeks. "I'm just concerned about my grandchildren and my daughter is not making good decisions, she has actually taken the children and moved to [place] and is living in a hotel, doing door dash with the children in the car." Pt has decided to move to [facility] to be near her sister and other supportive relatives. Pt will have to go down for a few days to get things set up, hud housing, food stamps, banking, social security, which her sister will help her with. Pt will come back to [place] to pack up her things and move. Cln and pt explored all the progress pt has made over the years, however her biggest stressor has always been  her daughter, but pt has learned effective coping skills which provide her with a defense against her daughter. Again, Cln reviewed relaxing skills and grounding skills including mindful breathing, progressive muscle relaxation and positive visualizations.  Cln encouraged pt to continue with her self-care.   Assessment and plan: Counselor will continue to meet with patient to address treatment plan goals. Patient will continue to follow recommendations of providers and implement skills learned in session.  Follow Up Instructions:   I discussed the assessment and treatment plan with the patient. The patient was provided an opportunity to ask questions and all were answered. The patient agreed with the plan and demonstrated an understanding of the instructions.   The patient was advised to call back or seek an in-person evaluation if the symptoms worsen or if the condition fails to improve as anticipated.  I provided 60 minutes of non-face-to-face time during this encounter.   Antwuan Eckley S, LCAS

## 2020-09-19 NOTE — Progress Notes (Deleted)
   S:    Patient arrives ***.    Presents to the clinic for hypertension evaluation, counseling, and management.  Patient was referred and last seen by Primary Care Provider on ***.  Patient was referred on ***.  Patient was last seen by Primary Care Provider on ***.   Medication adherence *** .  Current BP Medications include:  ***  Antihypertensives tried in the past include: ***  Dietary habits include: *** Exercise habits include:*** Family / Social history: ***  ASCVD risk factors include:***   O:  Physical Exam  ROS  Home BP readings: ***  Last 3 Office BP readings: BP Readings from Last 3 Encounters:  08/27/20 (!) 168/92  07/16/20 132/82  07/02/20 126/77    BMET    Component Value Date/Time   NA 143 12/26/2019 0949   K 3.7 12/26/2019 0949   CL 103 12/26/2019 0949   CO2 26 12/26/2019 0949   GLUCOSE 84 12/26/2019 0949   GLUCOSE 86 02/18/2016 0948   BUN 16 12/26/2019 0949   CREATININE 1.01 (H) 12/26/2019 0949   CREATININE 0.89 02/18/2016 0948   CALCIUM 10.1 12/26/2019 0949   GFRNONAA 60 12/26/2019 0949   GFRNONAA 73 08/17/2014 1013   GFRAA 69 12/26/2019 0949   GFRAA 84 08/17/2014 1013    Renal function: CrCl cannot be calculated (Patient's most recent lab result is older than the maximum 21 days allowed.).  Clinical ASCVD: {YES/NO:21197} The 10-year ASCVD risk score Denman George DC Jr., et al., 2013) is: 26.1%   Values used to calculate the score:     Age: 65 years     Sex: Female     Is Non-Hispanic African American: Yes     Diabetic: Yes     Tobacco smoker: No     Systolic Blood Pressure: 168 mmHg     Is BP treated: Yes     HDL Cholesterol: 48 mg/dL     Total Cholesterol: 134 mg/dL  PHQ-9 Score: ***   A/P: Hypertension diagnosed *** currently *** on current medications. BP Goal = < *** mmHg. Medication adherence ***.  -{Meds adjust:18428} ***.  -F/u labs ordered - *** -Counseled on lifestyle modifications for blood pressure control including  reduced dietary sodium, increased exercise, adequate sleep.  Results reviewed and written information provided.   Total time in face-to-face counseling *** minutes.   F/U Clinic Visit in ***.  Patient seen with ***

## 2020-09-20 ENCOUNTER — Ambulatory Visit: Payer: Medicaid Other | Attending: Internal Medicine | Admitting: Pharmacist

## 2020-09-20 ENCOUNTER — Other Ambulatory Visit: Payer: Self-pay | Admitting: Internal Medicine

## 2020-09-20 ENCOUNTER — Other Ambulatory Visit: Payer: Self-pay

## 2020-09-20 VITALS — BP 130/84 | HR 79

## 2020-09-20 DIAGNOSIS — I10 Essential (primary) hypertension: Secondary | ICD-10-CM

## 2020-09-20 MED ORDER — AMLODIPINE BESYLATE 10 MG PO TABS
ORAL_TABLET | Freq: Every day | ORAL | 0 refills | Status: DC
  Filled 2020-09-20: qty 90, 90d supply, fill #0

## 2020-09-20 MED FILL — Hydrochlorothiazide Tab 25 MG: ORAL | 90 days supply | Qty: 90 | Fill #1 | Status: AC
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
## 2020-09-24 ENCOUNTER — Other Ambulatory Visit: Payer: Self-pay

## 2020-09-24 ENCOUNTER — Encounter (HOSPITAL_COMMUNITY): Payer: Self-pay | Admitting: Licensed Clinical Social Worker

## 2020-09-24 ENCOUNTER — Ambulatory Visit (INDEPENDENT_AMBULATORY_CARE_PROVIDER_SITE_OTHER): Payer: Medicaid Other | Admitting: Licensed Clinical Social Worker

## 2020-09-24 ENCOUNTER — Ambulatory Visit (HOSPITAL_COMMUNITY): Payer: Medicaid Other | Admitting: Licensed Clinical Social Worker

## 2020-09-24 DIAGNOSIS — F331 Major depressive disorder, recurrent, moderate: Secondary | ICD-10-CM

## 2020-09-24 NOTE — Progress Notes (Signed)
Virtual Visit via Telephone Note  I connected with [name] on 09/24/2020 at 10:00 am by phone enabled telehealth application and verified that I am speaking with the correct person using two identifiers. Patient does not have a computer or Internet.   I discussed the limitations of evaluation and management by telemedicine and the availability of in person appointments. The patient expressed understanding and agreed to proceed.  LOCATION:  Patient: Home Provider: Home Office    History of Present Illness: Pt was referred for OP therapy by her neurologist Dr. [place] Lions for depression.   Observations/Objective: Patient presented for today's session on time and was alert, oriented x5, with no evidence or self-report of SI/HI or A/V H.  Patient reported ongoing compliance with medication and denied any use of alcohol or illicit substances. Clinician inquired about patient's current emotional ratings, as well as any significant changes in thoughts, feelings or behavior since previous session.  Patient reported scores of 3/10 for depression, 3/10 for anxiety, 2/10 for anger/irritability.  Patient reports "my ratings are lower because my biggest stressor: daughter and grandchildren have moved to [place] and I have continued to block their phones." I'm still concerned about my grandchildren and my daughter is not making good decisions, but I don't have any control over my daughter." Cln explored the benefits of boundaries, even with family. Cln role played boundaries with pt. Pt continues with her plan to move to [facility] to be near her sister and other supportive relatives,  with plans to go down for a few days to get things set up, hud housing, food stamps, banking, social security. Pt will come back to [place] to pack up her things and move. Cln and pt explored possibilities she will experience with change.  "I'm so excited with the possibilities!" Cln reviewed with pt: relaxing skills and grounding skills  including mindful breathing, progressive muscle relaxation and positive visualizations.  Cln encouraged pt to continue with her self-care.   Assessment and plan: Counselor will continue to meet with patient to address treatment plan goals. Patient will continue to follow recommendations of providers and implement skills learned in session.  Follow Up Instructions:   I discussed the assessment and treatment plan with the patient. The patient was provided an opportunity to ask questions and all were answered. The patient agreed with the plan and demonstrated an understanding of the instructions.   The patient was advised to call back or seek an in-person evaluation if the symptoms worsen or if the condition fails to improve as anticipated.  I provided 45 minutes of non-face-to-face time during this encounter.   Leonarda Leis S, LCAS

## 2020-09-30 ENCOUNTER — Encounter (HOSPITAL_COMMUNITY): Payer: Self-pay | Admitting: Licensed Clinical Social Worker

## 2020-09-30 ENCOUNTER — Ambulatory Visit (INDEPENDENT_AMBULATORY_CARE_PROVIDER_SITE_OTHER): Payer: Medicaid Other | Admitting: Licensed Clinical Social Worker

## 2020-09-30 ENCOUNTER — Other Ambulatory Visit: Payer: Self-pay

## 2020-09-30 ENCOUNTER — Ambulatory Visit (HOSPITAL_COMMUNITY): Payer: Medicaid Other | Admitting: Licensed Clinical Social Worker

## 2020-09-30 DIAGNOSIS — F331 Major depressive disorder, recurrent, moderate: Secondary | ICD-10-CM

## 2020-09-30 NOTE — Progress Notes (Signed)
Virtual Visit via Telephone Note  I connected with [name] on 09/30/2020 at 10:00 am by phone enabled telehealth application and verified that I am speaking with the correct person using two identifiers. Patient does not have a computer or Internet.   I discussed the limitations of evaluation and management by telemedicine and the availability of in person appointments. The patient expressed understanding and agreed to proceed.  LOCATION:  Patient: Home Provider: Home Office    History of Present Illness: Pt was referred for OP therapy by her neurologist Dr. [place] Lions for depression.   Observations/Objective: Patient presented for today's session on time and was alert, oriented x5, with no evidence or self-report of SI/HI or A/V H.  Patient reported ongoing compliance with medication and denied any use of alcohol or illicit substances. Clinician inquired about patient's current emotional ratings, as well as any significant changes in thoughts, feelings or behavior since previous session.  Patient reported scores of 3/10 for depression, 3/10 for anxiety, 2/10 for anger/irritability.  Patient reports "my ratings are lower because my biggest stressor: daughter and grandchildren have moved to [place] and I have continued to block their phones." Cln validated pt's feelings about using her boundaries.  Pt continues with her plan to move to [facility] to be near her sister and other supportive relatives, with plans to go down for a few days to get things set up, hud housing, food stamps, banking, social security. Pt will come back to [place] to pack up her things and move. Pt reports she is enjoying her stress-free life. Clinician utilized CBT to process thoughts, feelings, and behaviors. Cln reviewed with pt: relaxing skills and grounding skills including mindful breathing, progressive muscle relaxation and positive visualizations.  Cln encouraged pt to continue with her self-care (walking early  morning.)  Assessment and plan: Counselor will continue to meet with patient to address treatment plan goals. Patient will continue to follow recommendations of providers and implement skills learned in session.  Follow Up Instructions:   I discussed the assessment and treatment plan with the patient. The patient was provided an opportunity to ask questions and all were answered. The patient agreed with the plan and demonstrated an understanding of the instructions.   The patient was advised to call back or seek an in-person evaluation if the symptoms worsen or if the condition fails to improve as anticipated.  I provided 45 minutes of non-face-to-face time during this encounter.   Sonal Dorwart S, LCAS

## 2020-10-01 ENCOUNTER — Encounter: Payer: Self-pay | Admitting: Podiatry

## 2020-10-01 ENCOUNTER — Other Ambulatory Visit: Payer: Self-pay

## 2020-10-01 ENCOUNTER — Ambulatory Visit: Payer: Medicaid Other | Admitting: Podiatry

## 2020-10-01 ENCOUNTER — Other Ambulatory Visit: Payer: Self-pay | Admitting: Internal Medicine

## 2020-10-01 DIAGNOSIS — M79675 Pain in left toe(s): Secondary | ICD-10-CM | POA: Diagnosis not present

## 2020-10-01 DIAGNOSIS — M79674 Pain in right toe(s): Secondary | ICD-10-CM

## 2020-10-01 DIAGNOSIS — B351 Tinea unguium: Secondary | ICD-10-CM | POA: Diagnosis not present

## 2020-10-01 DIAGNOSIS — E119 Type 2 diabetes mellitus without complications: Secondary | ICD-10-CM

## 2020-10-01 NOTE — Telephone Encounter (Signed)
Requested medication (s) are due for refill today:yes  Requested medication (s) are on the active medication list: yes  Last refill: 12/26/19  #12  1 refill  Future visit scheduled yes  12/27/20  Notes to clinic:not delegated  Requested Prescriptions  Pending Prescriptions Disp Refills   Vitamin D, Ergocalciferol, (DRISDOL) 1.25 MG (50000 UNIT) CAPS capsule 12 capsule 1    Sig: TAKE 1 CAPSULE (50,000 UNITS TOTAL) BY MOUTH EVERY 7 (SEVEN) DAYS.     Endocrinology:  Vitamins - Vitamin D Supplementation Failed - 10/01/2020 10:13 AM      Failed - 50,000 IU strengths are not delegated      Failed - Phosphate in normal range and within 360 days    Phosphorus  Date Value Ref Range Status  11/29/2006 4.5  Final          Passed - Ca in normal range and within 360 days    Calcium  Date Value Ref Range Status  12/26/2019 10.1 8.7 - 10.3 mg/dL Final          Passed - Vitamin D in normal range and within 360 days    Vit D, 25-Hydroxy  Date Value Ref Range Status  12/26/2019 33.0 30.0 - 100.0 ng/mL Final    Comment:    Vitamin D deficiency has been defined by the Institute of Medicine and an Endocrine Society practice guideline as a level of serum 25-OH vitamin D less than 20 ng/mL (1,2). The Endocrine Society went on to further define vitamin D insufficiency as a level between 21 and 29 ng/mL (2). 1. IOM (Institute of Medicine). 2010. Dietary reference    intakes for calcium and D. [place]: The    [employer]. 2. Holick MF, [place], Bischoff-Ferrari HA, et al.    Evaluation, treatment, and prevention of vitamin D    deficiency: an Endocrine Society clinical practice    guideline. JCEM. 2011 Jul; 96(7):1911-30.           Passed - Valid encounter within last 12 months    Recent Outpatient Visits           1 week ago Essential hypertension   [facility] Lois Huxley, Cornelius Moras, RPH-CPP   1 month ago Essential hypertension    [facility] Marcine Matar, MD   2 months ago Need for shingles vaccine   [facility] Lois Huxley, Cornelius Moras, RPH-CPP   3 months ago Essential hypertension   [facility] Drucilla Chalet, RPH-CPP   4 months ago Essential hypertension   [facility] Drucilla Chalet, RPH-CPP       Future Appointments             In 3 weeks Drucilla Chalet, RPH-CPP [facility]   In 2 months Laural Benes, Binnie Rail, MD [facility]

## 2020-10-02 ENCOUNTER — Other Ambulatory Visit: Payer: Self-pay

## 2020-10-03 ENCOUNTER — Other Ambulatory Visit: Payer: Self-pay

## 2020-10-06 NOTE — Progress Notes (Signed)
Subjective: 65 year old female presents the office with concerns of thick, discolored toenails that she cannot trim her self and they are starting to cause discomfort.  Also she gets calluses to her feet.  Also she has neuropathy which has been stable.  Denies any systemic complaints such as fevers, chills, nausea, vomiting. No acute changes since last appointment, and no other complaints at this time.   Objective: AAO x3, NAD DP/PT pulses palpable bilaterally, CRT less than 3 seconds Nails are hypertrophic, dystrophic, brittle, discolored, elongated 10. No surrounding redness or drainage. Tenderness nails 1-5 bilaterally. Hyperkeratotic lesion submetatarsal bilaterally without any underlying ulceration drainage or signs of infection.  No open lesions or pre-ulcerative lesions are identified today. No pain with calf compression, swelling, warmth, erythema  Assessment: Type 2 diabetes with neuropathy, symptomatic onychomycosis/hyperkeratotic lesions  Plan: -All treatment options discussed with the patient including all alternatives, risks, complications.  -Sharply debrided nails x10 without any complications or bleeding -Hyperkeratotic lesion sharply debrided x2 without any complications or bleeding -Discussed daily foot inspection -Patient encouraged to call the office with any questions, concerns, change in symptoms.   Vivi Barrack DPM

## 2020-10-07 ENCOUNTER — Ambulatory Visit (HOSPITAL_COMMUNITY): Payer: Medicaid Other | Admitting: Licensed Clinical Social Worker

## 2020-10-07 ENCOUNTER — Other Ambulatory Visit: Payer: Self-pay

## 2020-10-07 ENCOUNTER — Encounter (HOSPITAL_COMMUNITY): Payer: Self-pay | Admitting: Licensed Clinical Social Worker

## 2020-10-07 ENCOUNTER — Ambulatory Visit (INDEPENDENT_AMBULATORY_CARE_PROVIDER_SITE_OTHER): Payer: Medicaid Other | Admitting: Licensed Clinical Social Worker

## 2020-10-07 DIAGNOSIS — F331 Major depressive disorder, recurrent, moderate: Secondary | ICD-10-CM

## 2020-10-07 NOTE — Progress Notes (Signed)
Virtual Visit via Telephone Note  I connected with [name] on 10/07/2020 at 10:00 am by phone enabled telehealth application and verified that I am speaking with the correct person using two identifiers. Patient does not have a computer or Internet.   I discussed the limitations of evaluation and management by telemedicine and the availability of in person appointments. The patient expressed understanding and agreed to proceed.  LOCATION:  Patient: Home Provider: Home Office    History of Present Illness: Pt was referred for OP therapy by her neurologist Dr. [place] Lions for depression.   Observations/Objective: Patient presented for today's session on time and was alert, oriented x5, with no evidence or self-report of SI/HI or A/V H.  Patient reported ongoing compliance with medication and denied any use of alcohol or illicit substances. Clinician inquired about patient's current emotional ratings, as well as any significant changes in thoughts, feelings or behavior since previous session.  Patient reported scores of 2/10 for depression, 2/10 for anxiety, 1/10 for anger/irritability.  Patient reports "my emotional ratings are lower because my biggest stressor: daughter and grandchildren have moved to [place] and I have continued to block their phones." Cln validated pt's feelings about using her boundaries.  Pt continues with her plan to move to [facility] to be near her sister and other supportive relatives. Pt and her sister called her daughter for update on the grandchildren, however pt still has them blocked on her phone. Pt reports on how being stress free has kept her emotions low and her bp maintained. Clinician utilized MI OARS to reflect and summarize thoughts and feelings about this new normal life she is experiencing. Cln reviewed with pt: relaxing skills and grounding skills including mindful breathing, progressive muscle relaxation and positive visualizations.  Cln encouraged pt to continue  with her self-care (walking early morning.)  Assessment and plan: Counselor will continue to meet with patient to address treatment plan goals. Patient will continue to follow recommendations of providers and implement skills learned in session.  Follow Up Instructions:   I discussed the assessment and treatment plan with the patient. The patient was provided an opportunity to ask questions and all were answered. The patient agreed with the plan and demonstrated an understanding of the instructions.   The patient was advised to call back or seek an in-person evaluation if the symptoms worsen or if the condition fails to improve as anticipated.  I provided 45 minutes of non-face-to-face time during this encounter.   Jotham Ahn S, LCAS

## 2020-10-08 ENCOUNTER — Ambulatory Visit: Payer: Medicaid Other | Admitting: Registered"

## 2020-10-14 ENCOUNTER — Ambulatory Visit (HOSPITAL_COMMUNITY): Payer: Medicaid Other | Admitting: Licensed Clinical Social Worker

## 2020-10-16 ENCOUNTER — Other Ambulatory Visit: Payer: Self-pay

## 2020-10-17 ENCOUNTER — Other Ambulatory Visit: Payer: Self-pay

## 2020-10-17 ENCOUNTER — Telehealth (HOSPITAL_BASED_OUTPATIENT_CLINIC_OR_DEPARTMENT_OTHER): Payer: Medicaid Other | Admitting: Psychiatry

## 2020-10-17 DIAGNOSIS — F331 Major depressive disorder, recurrent, moderate: Secondary | ICD-10-CM

## 2020-10-17 DIAGNOSIS — F5101 Primary insomnia: Secondary | ICD-10-CM

## 2020-10-17 MED ORDER — MIRTAZAPINE 30 MG PO TABS
30.0000 mg | ORAL_TABLET | Freq: Every day | ORAL | 0 refills | Status: DC
  Filled 2020-10-17 – 2021-01-07 (×2): qty 90, 90d supply, fill #0

## 2020-10-17 NOTE — Progress Notes (Signed)
Virtual Visit via Telephone Note  I connected with [name] on 10/17/20 at  9:30 AM EDT by telephone and verified that I am speaking with the correct person using two identifiers.  Location: Patient: home Provider: office   I discussed the limitations, risks, security and privacy concerns of performing an evaluation and management service by telephone and the availability of in person appointments. I also discussed with the patient that there may be a patient responsible charge related to this service. The patient expressed understanding and agreed to proceed.   History of Present Illness: "I have been hanging in there". She had a bad dizzy spells and blacked out this past Sunday. She had to sit down for about 15 minutes before she could get up and go home. It was due to taking her meds late. She felt better after taking her meds and eating. [name] is trying to stay positive and stay away from negative energy and toxic people. [name]'s family works for [employer] and she often gets annoyed by the reported news being wrong. Her depression is mostly situational and is manageable. She journals where she shares her feelings and writes positive affirmations. It is helpful. Lately she has been staying up to watch movies on the tv. She is usually able to sleep well most nights. She denies SI/HI.    Observations/Objective:  General Appearance: unable to assess  Eye Contact:  unable to assess  Speech:  Clear and Coherent and Slow  Volume:  Normal  Mood:  Euthymic  Affect:  Restricted  Thought Process:  Coherent and Descriptions of Associations: Circumstantial  Orientation:  Full (Time, Place, and Person)  Thought Content:  Logical  Suicidal Thoughts:  No  Homicidal Thoughts:  No  Memory:  Immediate;   Good  Judgement:  Good  Insight:  Good  Psychomotor Activity: unable to assess  Concentration:  Concentration: Good  Recall:  Good  Fund of Knowledge:  Good  Language:  Good   Akathisia:  unable to assess  Handed:  unable to assess  AIMS (if indicated):     Assets:  Communication Skills Desire for Improvement Financial Resources/Insurance Housing Social Support Talents/Skills Transportation Vocational/Educational  ADL's:  unable to assess  Cognition:  WNL  Sleep:        Assessment and Plan: Depression screen [facility] 2/9 10/17/2020 07/18/2020 04/25/2020 03/04/2020 12/26/2019  Decreased Interest 0 0 0 1 0  Down, Depressed, Hopeless 1 0 0 1 0  PHQ - 2 Score 1 0 0 2 0  Altered sleeping - - 0 0 0  Tired, decreased energy - - 0 0 0  Change in appetite - - 0 0 0  Feeling bad or failure about yourself  - - 0 0 0  Trouble concentrating - - 0 0 0  Moving slowly or fidgety/restless - - 0 0 0  Suicidal thoughts - - 0 0 0  PHQ-9 Score - - 0 2 0  Difficult doing work/chores - - Not difficult at all Not difficult at all -  Some recent data might be hidden    Flowsheet Row Video Visit [facility] 10/17/2020 in [facility] Video Visit from 07/18/2020 in [facility] Video Visit from 04[facility]25/2020 in [facility]  C-SSRS RISK CATEGORY No Risk No Risk No Risk      - she is reporting good benefit in therapy   1. Major depressive disorder, recurrent episode, moderate (HCC) - mirtazapine (REMERON) 30 MG  tablet; Take 1 tablet (30 mg total) by mouth at bedtime.  Dispense: 90 tablet; Refill: 0  2. Primary insomnia - mirtazapine (REMERON) 30 MG tablet; Take 1 tablet (30 mg total) by mouth at bedtime.  Dispense: 90 tablet; Refill: 0   Follow Up Instructions: In 2-3 months or sooner if needed   I discussed the assessment and treatment plan with the patient. The patient was provided an opportunity to ask questions and all were answered. The patient agreed with the plan and demonstrated an understanding of the instructions.   The patient was advised to call back or seek  an in-person evaluation if the symptoms worsen or if the condition fails to improve as anticipated.  I provided 14 minutes of non-face-to-face time during this encounter.   Oletta Darter, MD

## 2020-10-21 ENCOUNTER — Ambulatory Visit (HOSPITAL_COMMUNITY): Payer: Medicaid Other | Admitting: Licensed Clinical Social Worker

## 2020-10-21 ENCOUNTER — Encounter (HOSPITAL_COMMUNITY): Payer: Self-pay | Admitting: Licensed Clinical Social Worker

## 2020-10-21 ENCOUNTER — Other Ambulatory Visit: Payer: Self-pay

## 2020-10-21 ENCOUNTER — Ambulatory Visit (INDEPENDENT_AMBULATORY_CARE_PROVIDER_SITE_OTHER): Payer: Medicaid Other | Admitting: Licensed Clinical Social Worker

## 2020-10-21 DIAGNOSIS — F331 Major depressive disorder, recurrent, moderate: Secondary | ICD-10-CM | POA: Diagnosis not present
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
## 2020-10-22 ENCOUNTER — Other Ambulatory Visit: Payer: Self-pay | Admitting: Pharmacist

## 2020-10-22 ENCOUNTER — Other Ambulatory Visit: Payer: Self-pay

## 2020-10-22 ENCOUNTER — Ambulatory Visit: Payer: Medicaid Other | Attending: Internal Medicine | Admitting: Pharmacist

## 2020-10-22 ENCOUNTER — Encounter: Payer: Self-pay | Admitting: Pharmacist

## 2020-10-22 VITALS — BP 119/66

## 2020-10-22 DIAGNOSIS — Z23 Encounter for immunization: Secondary | ICD-10-CM

## 2020-10-22 DIAGNOSIS — I10 Essential (primary) hypertension: Secondary | ICD-10-CM

## 2020-10-22 MED ORDER — AMLODIPINE BESYLATE 10 MG PO TABS
ORAL_TABLET | Freq: Every day | ORAL | 0 refills | Status: DC
  Filled 2020-10-22: qty 90, fill #0
  Filled 2021-01-07: qty 90, 90d supply, fill #0

## 2020-10-22 NOTE — Progress Notes (Signed)
    S:    PCP: Dr. Laural Benes  Patient arrives in good spirits. Presents to the clinic for hypertension evaluation, counseling, and management. Patient was referred and last seen by Primary Care Provider on 08/27/2020. Last seen by clinical pharmacist on 09/20/2020. BP at that visit was good - we made no changes.  Today, patient reports great adherence to prescribed medications. Denies dizziness, headaches or chest pains. She reports taking her medicine today prior to her visit at 0707.    Current BP Medications include:  Amlodipine 10 mg daily, HCTZ 25 mg daily, hydralazine 50 mg BID  Dietary habits include: improving with the help of her dietician she is now seeing monthly. She has eliminated "white" things including preservatives and sodium. Does treat herself to [employer] every ~2 weeks..  Exercise habits include: she has resumed her walking schedule of 30-45 mins/day Sunday-Friday. Saturday is her rest day  Family / Social history:  - FHx: asthma, HTN, heart disease, stroke  - Tobacco: never smoker  - Alcohol: denies use   O:  Vitals:   10/22/20 0903  BP: 119/66    Home BP readings: none, unsure how to use BP machine. Did not bring to appointment today.  Last 3 Office BP readings: BP Readings from Last 3 Encounters:  10/22/20 119/66  09/20/20 130/84  08/27/20 (!) 168/92   BMET    Component Value Date/Time   NA 143 12/26/2019 0949   K 3.7 12/26/2019 0949   CL 103 12/26/2019 0949   CO2 26 12/26/2019 0949   GLUCOSE 84 12/26/2019 0949   GLUCOSE 86 02/18/2016 0948   BUN 16 12/26/2019 0949   CREATININE 1.01 (H) 12/26/2019 0949   CREATININE 0.89 02/18/2016 0948   CALCIUM 10.1 12/26/2019 0949   GFRNONAA 60 12/26/2019 0949   GFRNONAA 73 08/17/2014 1013   GFRAA 69 12/26/2019 0949   GFRAA 84 08/17/2014 1013   Renal function: CrCl cannot be calculated (Patient's most recent lab result is older than the maximum 21 days allowed.).  Clinical ASCVD: No  The 10-year ASCVD risk  score (Arnett DK, et al., 2019) is: 11.4%   Values used to calculate the score:     Age: 79 years     Sex: Female     Is Non-Hispanic African American: Yes     Diabetic: Yes     Tobacco smoker: No     Systolic Blood Pressure: 119 mmHg     Is BP treated: Yes     HDL Cholesterol: 48 mg/dL     Total Cholesterol: 134 mg/dL  A/P: Hypertension longstanding currently at goal. BP Goal = < 130/80 mmHg. Medication adherence reported. Recommend to continue current regimen.  -Continued current regimen with amlodipine, hctz and hydralazine  -Counseled on lifestyle modifications for blood pressure control including reduced dietary sodium, increased exercise, adequate sleep.  Results reviewed and written information provided.   Total time in face-to-face counseling 30 minutes.   F/U PCP visit on 12/27/2020.   Butch Penny, PharmD, Patsy Baltimore, CPP Clinical Pharmacist [facility] [phone]

## 2020-10-22 NOTE — Addendum Note (Signed)
Addended by: Lois Huxley, Jeannett Senior L on: 10/22/2020 09:15 AM   Modules accepted: Orders

## 2020-10-28 ENCOUNTER — Ambulatory Visit (INDEPENDENT_AMBULATORY_CARE_PROVIDER_SITE_OTHER): Payer: Medicaid Other | Admitting: Licensed Clinical Social Worker

## 2020-10-28 ENCOUNTER — Encounter (HOSPITAL_COMMUNITY): Payer: Self-pay | Admitting: Licensed Clinical Social Worker

## 2020-10-28 ENCOUNTER — Other Ambulatory Visit: Payer: Self-pay

## 2020-10-28 DIAGNOSIS — F331 Major depressive disorder, recurrent, moderate: Secondary | ICD-10-CM | POA: Diagnosis not present

## 2020-10-28 NOTE — Progress Notes (Signed)
Virtual Visit via Telephone Note  I connected with [name] on 10/28/2020 at 10:00 am by phone enabled telehealth application and verified that I am speaking with the correct person using two identifiers. Patient does not have a computer or Internet.   I discussed the limitations of evaluation and management by telemedicine and the availability of in person appointments. The patient expressed understanding and agreed to proceed.  LOCATION:  Patient: Home Provider: Home Office    History of Present Illness: Pt was referred for OP therapy by her neurologist Dr. Omak Lions for depression.   Observations/Objective: Patient presented for today's session on time and was alert, oriented x5, with no evidence or self-report of SI/HI or A/V H.  Patient reported ongoing compliance with medication and denied any use of alcohol or illicit substances. Clinician inquired about patient's current emotional ratings, as well as any significant changes in thoughts, feelings or behavior since previous session.  Patient reported scores of 3/10 for depression, 3/10 for anxiety, 3/10 for anger/irritability.  Cln reviewed emotional ratings and coping skills. Pt continues with her plan to move to [facility] to be near her sister and other supportive relatives and will go 10/18 for 2 weeks. Pt will return home to pack and her sister will help pack and drive pt to [place]. Clinician used MI  to provide support and confidence in her decisions. Cln encouraged pt to continue with her self-care (walking early morning.)  Assessment and plan: Counselor will continue to meet with patient to address treatment plan goals. Patient will continue to follow recommendations of providers and implement skills learned in session.  Follow Up Instructions:   I discussed the assessment and treatment plan with the patient. The patient was provided an opportunity to ask questions and all were answered. The patient agreed with the plan and  demonstrated an understanding of the instructions.   The patient was advised to call back or seek an in-person evaluation if the symptoms worsen or if the condition fails to improve as anticipated.  I provided 45 minutes of non-face-to-face time during this encounter.   Rylee Nuzum S, LCAS

## 2020-11-05 ENCOUNTER — Ambulatory Visit: Payer: Medicaid Other | Admitting: Registered"

## 2020-11-05 ENCOUNTER — Ambulatory Visit (HOSPITAL_COMMUNITY): Payer: Medicaid Other | Admitting: Licensed Clinical Social Worker

## 2020-11-18 ENCOUNTER — Ambulatory Visit (INDEPENDENT_AMBULATORY_CARE_PROVIDER_SITE_OTHER): Payer: Medicaid Other | Admitting: Licensed Clinical Social Worker

## 2020-11-18 ENCOUNTER — Other Ambulatory Visit: Payer: Self-pay

## 2020-11-18 ENCOUNTER — Encounter (HOSPITAL_COMMUNITY): Payer: Self-pay | Admitting: Licensed Clinical Social Worker

## 2020-11-18 DIAGNOSIS — F331 Major depressive disorder, recurrent, moderate: Secondary | ICD-10-CM

## 2020-11-18 NOTE — Progress Notes (Signed)
Virtual Visit via Telephone Note  I connected with [name] on 11/18/2020 at 10:00 am by phone enabled telehealth application and verified that I am speaking with the correct person using two identifiers. Patient does not have a computer or Internet.   I discussed the limitations of evaluation and management by telemedicine and the availability of in person appointments. The patient expressed understanding and agreed to proceed.  LOCATION:  Patient: Home Provider: Home Office    History of Present Illness: Pt was referred for OP therapy by her neurologist Dr. Gerster Lions for depression.   Observations/Objective: Patient presented for today's session on time and was alert, oriented x5, with no evidence or self-report of SI/HI or A/V H.  Patient reported ongoing compliance with medication and denied any use of alcohol or illicit substances. Clinician inquired about patient's current emotional ratings, as well as any significant changes in thoughts, feelings or behavior since previous session.  Patient reported scores of 6/10 for depression, 3/10 for anxiety, 2/10 for anger/irritability.  Cln reviewed emotional ratings and coping skills. Pt continues with her plan to move to [facility] to be near her sister and other supportive relatives, putting it off to after the first of the year. Pt has been in contact with her daughter in [place] to check on grandkids. The grandchildren are having some disciplinary issues at school and have been referred for therapy, but her daughter doesn't want them in therapy. Cln and pt explored her feelings about her grandchildren needing mental health services, which has exacerbated her depressive symptoms.  Cln encouraged pt to continue with her self-care (walking early morning.)  Assessment and plan: Counselor will continue to meet with patient to address treatment plan goals. Patient will continue to follow recommendations of providers and implement skills learned in  session.  Follow Up Instructions:   I discussed the assessment and treatment plan with the patient. The patient was provided an opportunity to ask questions and all were answered. The patient agreed with the plan and demonstrated an understanding of the instructions.   The patient was advised to call back or seek an in-person evaluation if the symptoms worsen or if the condition fails to improve as anticipated.  I provided 45 minutes of non-face-to-face time during this encounter.   Cellie Dardis S, LCAS

## 2020-11-26 ENCOUNTER — Other Ambulatory Visit: Payer: Self-pay

## 2020-11-26 ENCOUNTER — Ambulatory Visit (INDEPENDENT_AMBULATORY_CARE_PROVIDER_SITE_OTHER): Payer: Medicaid Other | Admitting: Licensed Clinical Social Worker

## 2020-11-26 ENCOUNTER — Encounter (HOSPITAL_COMMUNITY): Payer: Self-pay | Admitting: Licensed Clinical Social Worker

## 2020-11-26 DIAGNOSIS — F331 Major depressive disorder, recurrent, moderate: Secondary | ICD-10-CM

## 2020-11-26 NOTE — Progress Notes (Signed)
Virtual Visit via Telephone Note  I connected with [name] on 11/26/2020 at 9:00 am by phone enabled telehealth application and verified that I am speaking with the correct person using two identifiers. Patient does not have a computer or Internet.   I discussed the limitations of evaluation and management by telemedicine and the availability of in person appointments. The patient expressed understanding and agreed to proceed.  LOCATION:  Patient: Home Provider: Home Office    History of Present Illness: Pt was referred for OP therapy by her neurologist Dr. Elmer Lions for depression.   Observations/Objective: Patient presented for today's session on time and was alert, oriented x5, with no evidence or self-report of SI/HI or A/V H.  Patient reported ongoing compliance with medication and denied any use of alcohol or illicit substances. Clinician inquired about patient's current emotional ratings, as well as any significant changes in thoughts, feelings or behavior since previous session.  Patient reported emotional ratings of 3/10 for depression, 2/10 for anxiety, 1/10 for anger/irritability.  Cln and pt reviewed emotional ratings and coping skills. Pt continues with her plan to move to [facility] to be near her sister and other supportive relatives, putting it off to after the first of the year. Pt continues to reports that her emotional ratings have decreased Due to her biggest stressor: daughter and grandchildren no longer living in [place]. "They no longer can cause me stress." Cln and pt reviewed the pros/cons of moving to [place]. The pros outweigh the cons and should assist with patient's depressive symptoms. Cln encouraged pt to continue with her self-care (walking early morning.)  Assessment and plan: Counselor will continue to meet with patient to address treatment plan goals. Patient will continue to follow recommendations of providers and implement skills learned in session.  Follow Up  Instructions:   I discussed the assessment and treatment plan with the patient. The patient was provided an opportunity to ask questions and all were answered. The patient agreed with the plan and demonstrated an understanding of the instructions.   The patient was advised to call back or seek an in-person evaluation if the symptoms worsen or if the condition fails to improve as anticipated.  I provided 60 minutes of non-face-to-face time during this encounter.   Jatniel Verastegui S, LCAS

## 2020-12-02 ENCOUNTER — Other Ambulatory Visit: Payer: Self-pay

## 2020-12-02 ENCOUNTER — Encounter (HOSPITAL_COMMUNITY): Payer: Self-pay | Admitting: Licensed Clinical Social Worker

## 2020-12-02 ENCOUNTER — Ambulatory Visit (INDEPENDENT_AMBULATORY_CARE_PROVIDER_SITE_OTHER): Payer: Medicaid Other | Admitting: Licensed Clinical Social Worker

## 2020-12-02 DIAGNOSIS — F331 Major depressive disorder, recurrent, moderate: Secondary | ICD-10-CM

## 2020-12-02 NOTE — Progress Notes (Signed)
Virtual Visit via Telephone Note  I connected with [name] on 12/02/2020 at 9:00 am by phone enabled telehealth application and verified that I am speaking withthe correct person using two identifiers. Patient does not have a computer or Internet.   I discussed the limitations of evaluation and management by telemedicine and the availability of in person appointments. The patient expressed understanding and agreed to proceed.  LOCATION:  Patient: Home Provider: Home Office    History of Present Illness: Pt was referred for OP therapy by her neurologist Dr. Pharr Lions for depression.   Observations/Objective: Patient presented for today's session on time and was alert, oriented x5, with no evidence or self-report of SI/HI or A/V H.  Patient reported ongoing compliance with medication and denied any use of alcohol or illicit substances. Clinician inquired about patient's current emotional ratings, as well as any significant changes in thoughts, feelings or behavior since previous session.  Patient reported emotional ratings of 1/10 for depression, 1/10 for anxiety, 1/10 for anger/irritability.  Cln and pt reviewed emotional ratings and coping skills.  Pt continues to report that her emotional ratings have decreased due to her biggest stressor: daughter and grandchildren no longer living in [place]. "They no longer can cause me stress." Cln encouraged pt to continue with her self-care (walking early morning.)  Assessment and plan: Counselor will continue to meet with patient to address treatment plan goals. Patient will continue to follow recommendations of providers and implement skills learned in session.  Follow Up Instructions:   I discussed the assessment and treatment plan with the patient. The patient was provided an opportunity to ask questions and all were answered. The patient agreed with the plan and demonstrated an understanding of the instructions.   The patient was advised to call  back or seek an in-person evaluation if the symptoms worsen or if the condition fails to improve as anticipated.  I provided 60 minutes of non-face-to-face time during this encounter.   Josselyn Harkins S, LCAS

## 2020-12-03 ENCOUNTER — Ambulatory Visit: Payer: Medicaid Other | Admitting: Registered"

## 2020-12-09 ENCOUNTER — Ambulatory Visit (INDEPENDENT_AMBULATORY_CARE_PROVIDER_SITE_OTHER): Payer: Medicaid Other | Admitting: Licensed Clinical Social Worker

## 2020-12-09 ENCOUNTER — Encounter (HOSPITAL_COMMUNITY): Payer: Self-pay | Admitting: Licensed Clinical Social Worker

## 2020-12-09 ENCOUNTER — Other Ambulatory Visit: Payer: Self-pay

## 2020-12-09 DIAGNOSIS — F331 Major depressive disorder, recurrent, moderate: Secondary | ICD-10-CM

## 2020-12-09 NOTE — Progress Notes (Signed)
Virtual Visit via Telephone Note  I connected with [name] on 12/09/2020 at 9:00 am by phone enabled telehealth application and verified that I am speaking withthe correct person using two identifiers. Patient does not have a computer or Internet.   I discussed the limitations of evaluation and management by telemedicine and the availability of in person appointments. The patient expressed understanding and agreed to proceed.  LOCATION:  Patient: Home Provider: Home Office    History of Present Illness: Pt was referred for OP therapy by her neurologist Dr. Harman Lions for depression.   Observations/Objective: Patient presented for today's session on time and was alert, oriented x5, with no evidence or self-report of SI/HI or A/V H.  Patient reported ongoing compliance with medication and denied any use of alcohol or illicit substances. Clinician inquired about patient's current emotional ratings, as well as any significant changes in thoughts, feelings or behavior since previous session.  Patient reported emotional ratings of 1/10 for depression, 1/10 for anxiety, 1/10 for anger/irritability.  Cln and pt reviewed emotional ratings and coping skills. Cln and pt reviewed Youtube meditations and pt practiced in session. Pt shared her Thanksgiving plans, which should be stress free.  Pt continues to report that her emotional ratings have decreased due to her biggest stressor: daughter and grandchildren no longer living in [place]. "They no longer can cause me stress." Cln encouraged pt to continue with her self-care (walking early morning.)  Assessment and plan: Counselor will continue to meet with patient to address treatment plan goals. Patient will continue to follow recommendations of providers and implement skills learned in session.  Follow Up Instructions:   I discussed the assessment and treatment plan with the patient. The patient was provided an opportunity to ask questions and all were  answered. The patient agreed with the plan and demonstrated an understanding of the instructions.   The patient was advised to call back or seek an in-person evaluation if the symptoms worsen or if the condition fails to improve as anticipated.  I provided 60 minutes of non-face-to-face time during this encounter.   Jeronimo Hellberg S, LCAS

## 2020-12-16 ENCOUNTER — Encounter (HOSPITAL_COMMUNITY): Payer: Self-pay | Admitting: Licensed Clinical Social Worker

## 2020-12-16 ENCOUNTER — Other Ambulatory Visit: Payer: Self-pay

## 2020-12-16 ENCOUNTER — Ambulatory Visit (INDEPENDENT_AMBULATORY_CARE_PROVIDER_SITE_OTHER): Payer: Medicaid Other | Admitting: Licensed Clinical Social Worker

## 2020-12-16 DIAGNOSIS — F331 Major depressive disorder, recurrent, moderate: Secondary | ICD-10-CM

## 2020-12-16 NOTE — Progress Notes (Signed)
Virtual Visit via Telephone Note  I connected with [name] on 12/16/2020 at 10:00 am by phone enabled telehealth application and verified that I am speaking withthe correct person using two identifiers. Patient does not have a computer or Internet.   I discussed the limitations of evaluation and management by telemedicine and the availability of in person appointments. The patient expressed understanding and agreed to proceed.  LOCATION:  Patient: Home Provider: Home Office    History of Present Illness: Pt was referred for OP therapy by her neurologist Dr. [place] Lions for depression.   Observations/Objective: Patient presented for today's session on time and was alert, oriented x5, with no evidence or self-report of SI/HI or A/V H.  Patient reported ongoing compliance with medication and denied any use of alcohol or illicit substances. Clinician inquired about patient's current emotional ratings, as well as any significant changes in thoughts, feelings or behavior since previous session.  Patient reported emotional ratings of 1/10 for depression, 1/10 for anxiety, 1/10 for anger/irritability.  Cln and pt reviewed emotional ratings and coping skills. Pt reports her daughter and grandchildren came to town and she saw them very briefly. "I didn't see them long enough to increase my emotional ratings."  Pt shared about her Thanksgiving plans, which were stress free.  Cln encouraged pt to continue with her self-care (walking early morning.)  Assessment and plan: Counselor will continue to meet with patient to address treatment plan goals. Patient will continue to follow recommendations of providers and implement skills learned in session.  Follow Up Instructions:   I discussed the assessment and treatment plan with the patient. The patient was provided an opportunity to ask questions and all were answered. The patient agreed with the plan and demonstrated an understanding of the instructions.    The patient was advised to call back or seek an in-person evaluation if the symptoms worsen or if the condition fails to improve as anticipated.  I provided 60 minutes of non-face-to-face time during this encounter.   Ovetta Bazzano S, LCAS

## 2020-12-17 ENCOUNTER — Ambulatory Visit: Payer: Medicaid Other | Admitting: Registered"

## 2020-12-23 ENCOUNTER — Ambulatory Visit (INDEPENDENT_AMBULATORY_CARE_PROVIDER_SITE_OTHER): Payer: Medicaid Other | Admitting: Licensed Clinical Social Worker

## 2020-12-23 ENCOUNTER — Other Ambulatory Visit: Payer: Self-pay

## 2020-12-23 ENCOUNTER — Encounter (HOSPITAL_COMMUNITY): Payer: Self-pay | Admitting: Licensed Clinical Social Worker

## 2020-12-23 DIAGNOSIS — F331 Major depressive disorder, recurrent, moderate: Secondary | ICD-10-CM

## 2020-12-23 NOTE — Progress Notes (Signed)
Virtual Visit via Telephone Note  I connected with [name] on 12/23/2020 at 10:00 am by phone enabled telehealth application and verified that I am speaking withthe correct person using two identifiers. Patient does not have a computer or Internet.   I discussed the limitations of evaluation and management by telemedicine and the availability of in person appointments. The patient expressed understanding and agreed to proceed.  LOCATION:  Patient: Home Provider: Home Office    History of Present Illness: Pt was referred for OP therapy by her neurologist Dr. [place] Lions for depression.   Observations/Objective: Patient presented for today's session on time and was alert, oriented x5, with no evidence or self-report of SI/HI or A/V H.  Patient reported ongoing compliance with medication and denied any use of alcohol or illicit substances. Clinician inquired about patient's current emotional ratings, as well as any significant changes in thoughts, feelings or behavior since previous session.  Patient reported emotional ratings of 1/10 for depression, 1/10 for anxiety, 1/10 for anger/irritability.  Cln and pt reviewed emotional ratings and coping skills. Pt reports "I had a dizzy spell Friday which lasted 29 minutes." Pt has been asked by her neurologist to write down each time she has a dizzy spell, seizure, or passing out. Cln and pt explored potential triggers behind the dizzy spell: "being on a crowded bus, coming home and cooking a meal, and being on my feet too long." Pt reports she wrote it down as well as triggers for spell. Cln and pt explored alternatives to riding the bus around town to pay her bills in person. Cln reviewed tx plan with pt who verbalized acceptance of the plan. Cln encouraged pt to continue with her self-care (walking early morning.)  Assessment and plan: Counselor will continue to meet with patient to address treatment plan goals. Patient will continue to follow  recommendations of providers and implement skills learned in session.  Follow Up Instructions:   I discussed the assessment and treatment plan with the patient. The patient was provided an opportunity to ask questions and all were answered. The patient agreed with the plan and demonstrated an understanding of the instructions.   The patient was advised to call back or seek an in-person evaluation if the symptoms worsen or if the condition fails to improve as anticipated.  I provided 45 minutes of non-face-to-face time during this encounter.   Maneh Sieben S, LCAS

## 2020-12-27 ENCOUNTER — Ambulatory Visit: Payer: Medicaid Other | Admitting: Internal Medicine

## 2020-12-30 ENCOUNTER — Encounter (HOSPITAL_COMMUNITY): Payer: Self-pay | Admitting: Licensed Clinical Social Worker

## 2020-12-30 ENCOUNTER — Other Ambulatory Visit: Payer: Self-pay

## 2020-12-30 ENCOUNTER — Ambulatory Visit (INDEPENDENT_AMBULATORY_CARE_PROVIDER_SITE_OTHER): Payer: Medicaid Other | Admitting: Licensed Clinical Social Worker

## 2020-12-30 DIAGNOSIS — F331 Major depressive disorder, recurrent, moderate: Secondary | ICD-10-CM

## 2020-12-30 NOTE — Progress Notes (Signed)
Virtual Visit via Telephone Note  I connected with [name] on 12/30/2020 at 10:00 am by phone enabled telehealth application and verified that I am speaking withthe correct person using two identifiers. Patient does not have a computer or Internet.   I discussed the limitations of evaluation and management by telemedicine and the availability of in person appointments. The patient expressed understanding and agreed to proceed.  LOCATION:  Patient: Home Provider: Home Office    History of Present Illness: Pt was referred for OP therapy by her neurologist Dr. [place] Lions for depression.   Observations/Objective: Patient presented for today's session on time and was alert, oriented x5, with no evidence or self-report of SI/HI or A/V H.  Patient reported ongoing compliance with medication and denied any use of alcohol or illicit substances. Clinician inquired about patient's current emotional ratings, as well as any significant changes in thoughts, feelings or behavior since previous session.  Patient reported emotional ratings of 5/10 for depression, 5/10 for anxiety, 5/10 for anger/irritability.  Cln and pt reviewed emotional ratings and coping skills. Pt reports "I had a dizzy spell, which was moving towards a seizure, within the last week because I had a fight with my sister." Cln and pt explored the argument because pt has an excellent relationship with her sister normally. Pt began to reveal some new issues she's dealing with: hoarding and over-eating, cancelling her last 4 appointments with her nutritionist. Cln and pt explores her revelations, where pt shared explanations. Cln suggested pt contact her sister to let her know what's going on and Cln will reach out to Dr. Adolphus Birchwood, psychiatrist, to let her know this new information. Cln and pt will review next clinical steps at next session next week.    Assessment and plan: Counselor will continue to meet with patient to address treatment  plan goals. Patient will continue to follow recommendations of providers and implement skills learned in session.  Follow Up Instructions:   I discussed the assessment and treatment plan with the patient. The patient was provided an opportunity to ask questions and all were answered. The patient agreed with the plan and demonstrated an understanding of the instructions.   The patient was advised to call back or seek an in-person evaluation if the symptoms worsen or if the condition fails to improve as anticipated.  I provided 45 minutes of non-face-to-face time during this encounter.   Lanya Bucks S, LCAS

## 2020-12-31 ENCOUNTER — Other Ambulatory Visit: Payer: Self-pay

## 2020-12-31 ENCOUNTER — Ambulatory Visit: Payer: Medicaid Other | Admitting: Podiatry

## 2020-12-31 DIAGNOSIS — M79674 Pain in right toe(s): Secondary | ICD-10-CM

## 2020-12-31 DIAGNOSIS — M79675 Pain in left toe(s): Secondary | ICD-10-CM

## 2020-12-31 DIAGNOSIS — B351 Tinea unguium: Secondary | ICD-10-CM

## 2020-12-31 DIAGNOSIS — E119 Type 2 diabetes mellitus without complications: Secondary | ICD-10-CM

## 2021-01-03 NOTE — Progress Notes (Signed)
Subjective: 65 year old female presents the office with concerns of thick, discolored toenails that she cannot trim herself and they are causing discomfort.  The neuropathy which has been stable.  Denies any ulcerations.  She has no new concerns today.    Marcine Matar, MD  Objective: AAO x3, NAD DP/PT pulses palpable bilaterally, CRT less than 3 seconds Nails are hypertrophic, dystrophic, brittle, discolored, elongated 10. No surrounding redness or drainage. Tenderness nails 1-5 bilaterally. Hyperkeratotic lesion submetatarsal bilaterally without any underlying ulceration drainage or signs of infection.  No open lesions or pre-ulcerative lesions are identified today. No pain with calf compression, swelling, warmth, erythema  Assessment: Type 2 diabetes with neuropathy, symptomatic onychomycosis/hyperkeratotic lesions  Plan: -All treatment options discussed with the patient including all alternatives, risks, complications.  -Sharply debrided nails x10 without any complications or bleeding -Hyperkeratotic lesion sharply debrided x2 without any complications or bleeding. Hyperkeratotic tissue was minimal today.  -Discussed daily foot inspection -Patient encouraged to call the office with any questions, concerns, change in symptoms.   Vivi Barrack DPM

## 2021-01-06 ENCOUNTER — Encounter (HOSPITAL_COMMUNITY): Payer: Self-pay | Admitting: Licensed Clinical Social Worker

## 2021-01-06 ENCOUNTER — Ambulatory Visit (INDEPENDENT_AMBULATORY_CARE_PROVIDER_SITE_OTHER): Payer: Medicaid Other | Admitting: Licensed Clinical Social Worker

## 2021-01-06 DIAGNOSIS — F331 Major depressive disorder, recurrent, moderate: Secondary | ICD-10-CM | POA: Diagnosis not present

## 2021-01-06 NOTE — Progress Notes (Signed)
Virtual Visit via Telephone Note  I connected with [name] on 12/30/2020 at 10:00 am by phone enabled telehealth application and verified that I am speaking withthe correct person using two identifiers. Patient does not have a computer or Internet.   I discussed the limitations of evaluation and management by telemedicine and the availability of in person appointments. The patient expressed understanding and agreed to proceed.  LOCATION:  Patient: Home Provider: Home Office    History of Present Illness: Pt was referred for OP therapy by her neurologist Dr. [place] Lions for depression.   Observations/Objective: Patient presented for todays session on time and was alert, oriented x5, with no evidence or self-report of SI/HI or A/V H.  Patient reported ongoing compliance with medication and denied any use of alcohol or illicit substances. Clinician inquired about patients current emotional ratings, as well as any significant changes in thoughts, feelings or behavior since previous session.  Patient reported emotional ratings of 5/10 for depression, 3/10 for anxiety, 4/10 for anger/irritability.  Cln and pt reviewed emotional ratings and coping skills. Pt reports  she has had no seizures or dizzy spells since last session. Pt reports she did not call her sister because "I'm trying to remain calm." Cln provided education on impulse control. Cln utilized CBT to help pt expose the relationship between thoughts and behaviors. Cln emailed Dr. Adolphus Birchwood, psychiatrist, to let her know this new information.    Assessment and plan: Counselor will continue to meet with patient to address treatment plan goals. Patient will continue to follow recommendations of providers and implement skills learned in session.  Follow Up Instructions:   I discussed the assessment and treatment plan with the patient. The patient was provided an opportunity to ask questions and all were answered. The patient agreed with the  plan and demonstrated an understanding of the instructions.   The patient was advised to call back or seek an in-person evaluation if the symptoms worsen or if the condition fails to improve as anticipated.  I provided 60 minutes of non-face-to-face time during this encounter.   Margarito Dehaas S, LCAS

## 2021-01-07 ENCOUNTER — Other Ambulatory Visit: Payer: Self-pay

## 2021-01-07 ENCOUNTER — Other Ambulatory Visit: Payer: Self-pay | Admitting: Internal Medicine

## 2021-01-07 MED ORDER — HYDROCHLOROTHIAZIDE 25 MG PO TABS
ORAL_TABLET | Freq: Every day | ORAL | 1 refills | Status: DC
  Filled 2021-01-07: qty 30, 30d supply, fill #0

## 2021-01-08 ENCOUNTER — Other Ambulatory Visit: Payer: Self-pay

## 2021-01-08 ENCOUNTER — Other Ambulatory Visit: Payer: Self-pay | Admitting: Pharmacist

## 2021-01-08 MED ORDER — HYDROCHLOROTHIAZIDE 25 MG PO TABS
ORAL_TABLET | Freq: Every day | ORAL | 0 refills | Status: DC
  Filled 2021-01-08: qty 90, 90d supply, fill #0

## 2021-01-09 ENCOUNTER — Other Ambulatory Visit: Payer: Self-pay

## 2021-01-09 ENCOUNTER — Telehealth (HOSPITAL_BASED_OUTPATIENT_CLINIC_OR_DEPARTMENT_OTHER): Payer: Medicaid Other | Admitting: Psychiatry

## 2021-01-09 DIAGNOSIS — F5101 Primary insomnia: Secondary | ICD-10-CM

## 2021-01-09 DIAGNOSIS — F331 Major depressive disorder, recurrent, moderate: Secondary | ICD-10-CM | POA: Diagnosis not present

## 2021-01-09 MED ORDER — MIRTAZAPINE 30 MG PO TABS
30.0000 mg | ORAL_TABLET | Freq: Every day | ORAL | 0 refills | Status: DC
  Filled 2021-01-09 – 2021-04-08 (×2): qty 90, 90d supply, fill #0

## 2021-01-09 NOTE — Progress Notes (Signed)
Virtual Visit via Telephone Note  I connected with [name] on 01/09/21 at 10:00 AM EST by telephone and verified that I am speaking with the correct person using two identifiers. She does not have Internet or a smart phone to do a video chat.   Location: Patient: home Provider: office   I discussed the limitations, risks, security and privacy concerns of performing an evaluation and management service by telephone and the availability of in person appointments. I also discussed with the patient that there may be a patient responsible charge related to this service. The patient expressed understanding and agreed to proceed.   History of Present Illness: "I am hanging in there". She is noticing some hoarding like behaviors per her therapist. [name] has bought 8 tvs that were on sale over the course of a few years.  2 times a month she plays bingo and gave some of the away. She also likes umbrella's and has bought and given away several. She denies buying any other things. [name] has given away a lot of extra things in her apartment. She has not been going our shopping lately and that has helped her reduce her spending. She doesn't like crowds and that also helps to keep her away. [name] is ready for the holidays to be over so that things can go back to normal. Her anxiety comes and goes. It helps to pray and do positive affirmations. Her sleep and energy are good. Due to it being so cold she hasn't been able to go for her walks as usual. So she has been doing exercise videos instead. She denies depression, crying spells and hopelessness. [name] denies SI/HI and passive thoughts of death.    Observations/Objective:  General Appearance: unable to assess  Eye Contact:  unable to assess  Speech:  Clear and Coherent and Slow  Volume:  Normal  Mood:  Euthymic  Affect:  Restricted  Thought Process:  slow, concrete Coherent and Descriptions of Associations: Circumstantial  Orientation:  Full (Time,  Place, and Person)  Thought Content:  Logical  Suicidal Thoughts:  No  Homicidal Thoughts:  No  Memory:  Immediate;   Good  Judgement:  Good  Insight:  Good  Psychomotor Activity: unable to assess  Concentration:  Concentration: Fair  Recall:  Good  Fund of Knowledge:  Good  Language:  Good  Akathisia:  unable to assess  Handed:  unable to assess  AIMS (if indicated):     Assets:  Communication Skills Desire for Improvement Financial Resources/Insurance Housing Resilience Social Support Talents/Skills Transportation Vocational/Educational  ADL's:  unable to assess  Cognition:  WNL  Sleep:        Assessment and Plan: Depression screen [facility] 2/9 01/09/2021 10/17/2020 07/18/2020 04/25/2020 03/04/2020  Decreased Interest 0 0 0 0 1  Down, Depressed, Hopeless 0 1 0 0 1  PHQ - 2 Score 0 1 0 0 2  Altered sleeping - - - 0 0  Tired, decreased energy - - - 0 0  Change in appetite - - - 0 0  Feeling bad or failure about yourself  - - - 0 0  Trouble concentrating - - - 0 0  Moving slowly or fidgety/restless - - - 0 0  Suicidal thoughts - - - 0 0  PHQ-9 Score - - - 0 2  Difficult doing work/chores - - - Not difficult at all Not difficult at all  Some recent data might be hidden   Flowsheet Row Video Visit from 01/09/2021  in [facility] Video Visit from 10[facility]17/2020 in [facility] Video Visit from 07/18/2020 in [facility]  C-SSRS RISK CATEGORY No Risk No Risk No Risk        - reporting benefit from therapy  Based off her report it does not seem to be consistent with hoarding behavior.  Her anxiety is manageable and coping techniques are effective.   1. Major depressive disorder, recurrent episode, moderate (HCC) - mirtazapine (REMERON) 30 MG tablet; Take 1 tablet (30 mg total) by mouth at bedtime.  Dispense: 90 tablet; Refill: 0  2. Primary insomnia -  mirtazapine (REMERON) 30 MG tablet; Take 1 tablet (30 mg total) by mouth at bedtime.  Dispense: 90 tablet; Refill: 0    Follow Up Instructions: In 2-3 months or sooner if needed   I discussed the assessment and treatment plan with the patient. The patient was provided an opportunity to ask questions and all were answered. The patient agreed with the plan and demonstrated an understanding of the instructions.   The patient was advised to call back or seek an in-person evaluation if the symptoms worsen or if the condition fails to improve as anticipated.  I provided 27 minutes of non-face-to-face time during this encounter.   Oletta Darter, MD

## 2021-01-16 ENCOUNTER — Other Ambulatory Visit: Payer: Self-pay

## 2021-01-16 ENCOUNTER — Ambulatory Visit (INDEPENDENT_AMBULATORY_CARE_PROVIDER_SITE_OTHER): Payer: Medicaid Other | Admitting: Licensed Clinical Social Worker

## 2021-01-16 ENCOUNTER — Encounter (HOSPITAL_COMMUNITY): Payer: Self-pay | Admitting: Licensed Clinical Social Worker

## 2021-01-16 DIAGNOSIS — F331 Major depressive disorder, recurrent, moderate: Secondary | ICD-10-CM

## 2021-01-16 NOTE — Progress Notes (Signed)
Virtual Visit via Telephone Note  I connected with [name] on 01/16/2021 at 10:30 am by phone enabled telehealth application and verified that I am speaking withthe correct person using two identifiers. Patient does not have a computer or Internet.   I discussed the limitations of evaluation and management by telemedicine and the availability of in person appointments. The patient expressed understanding and agreed to proceed.  LOCATION:  Patient: Home Provider: Home Office    History of Present Illness: Pt was referred for OP therapy by her neurologist Dr. [place] Lions for depression.   Observations/Objective: Patient presented for todays session on time and was alert, oriented x5, with no evidence or self-report of SI/HI or A/V H.  Patient reported ongoing compliance with medication and denied any use of alcohol or illicit substances. Clinician inquired about patients current emotional ratings, as well as any significant changes in thoughts, feelings or behavior since previous session.  Patient reported emotional ratings of 5/10 for depression, 3/10 for anxiety, 2/10 for anger/irritability.  Cln and pt reviewed emotional ratings and coping skills. Pt reports  she has had no seizures or dizzy spells since last session. Pt reports on her holiday plans, which were calm, stress-free and low key. Clinician utilized MI OARS to reflect and summarize thoughts and feelings. "I received a nice card from my sister, so I called her to apologize,for our disagreement, but she hasn't called me back." Clinician explored updates in the family, as well as concerns pt is experiencing.     Assessment and plan: Counselor will continue to meet with patient to address treatment plan goals. Patient will continue to follow recommendations of providers and implement skills learned in session.  Follow Up Instructions:   I discussed the assessment and treatment plan with the patient. The patient was provided an  opportunity to ask questions and all were answered. The patient agreed with the plan and demonstrated an understanding of the instructions.   The patient was advised to call back or seek an in-person evaluation if the symptoms worsen or if the condition fails to improve as anticipated.  I provided 30 minutes of non-face-to-face time during this encounter.   Maxwel Meadowcroft S, LCAS

## 2021-01-21 ENCOUNTER — Ambulatory Visit (INDEPENDENT_AMBULATORY_CARE_PROVIDER_SITE_OTHER): Payer: Medicaid Other | Admitting: Licensed Clinical Social Worker

## 2021-01-21 ENCOUNTER — Other Ambulatory Visit: Payer: Self-pay

## 2021-01-21 ENCOUNTER — Encounter (HOSPITAL_COMMUNITY): Payer: Self-pay | Admitting: Licensed Clinical Social Worker

## 2021-01-21 DIAGNOSIS — F331 Major depressive disorder, recurrent, moderate: Secondary | ICD-10-CM | POA: Diagnosis not present

## 2021-01-21 NOTE — Progress Notes (Signed)
Virtual Visit via Telephone Note  I connected with [name] on 01/21/2021 at 10:00 am by phone enabled telehealth application and verified that I am speaking withthe correct person using two identifiers. Patient does not have a computer or Internet.   I discussed the limitations of evaluation and management by telemedicine and the availability of in person appointments. The patient expressed understanding and agreed to proceed.  LOCATION:  Patient: Home Provider: Home Office    History of Present Illness: Pt was referred for OP therapy by her neurologist Dr. [place] Lions for depression.   Observations/Objective: Patient presented for todays session on time and was alert, oriented x5, with no evidence or self-report of SI/HI or A/V H.  Patient reported ongoing compliance with medication and denied any use of alcohol or illicit substances. Clinician inquired about patients current emotional ratings, as well as any significant changes in thoughts, feelings or behavior since previous session.  Patient reported emotional ratings of 5/10 for depression, 3/10 for anxiety, 2/10 for anger/irritability.  Cln and pt reviewed emotional ratings and coping skills. Pt reports  she has had no seizures or dizzy spells since last session. Pt reports on her holiday plans, which were calm, stress-free and low key. Clinician utilized MI OARS to reflect and summarize thoughts and feelings. "I finally talked to my sister, I called her to apologize for our disagreement. I also talked to my daughter and grandchildren and everyone is fine."  Cln used CBT to assist patient with her stress.        Assessment and plan: Counselor will continue to meet with patient to address treatment plan goals. Patient will continue to follow recommendations of providers and implement skills learned in session.  Follow Up Instructions:   I discussed the assessment and treatment plan with the patient. The patient was provided an  opportunity to ask questions and all were answered. The patient agreed with the plan and demonstrated an understanding of the instructions.   The patient was advised to call back or seek an in-person evaluation if the symptoms worsen or if the condition fails to improve as anticipated.  I provided 45 minutes of non-face-to-face time during this encounter.   Leif Loflin S, LCAS

## 2021-01-27 ENCOUNTER — Encounter (HOSPITAL_COMMUNITY): Payer: Self-pay | Admitting: Licensed Clinical Social Worker

## 2021-01-27 ENCOUNTER — Other Ambulatory Visit: Payer: Self-pay

## 2021-01-27 ENCOUNTER — Ambulatory Visit (INDEPENDENT_AMBULATORY_CARE_PROVIDER_SITE_OTHER): Payer: Medicaid Other | Admitting: Licensed Clinical Social Worker

## 2021-01-27 DIAGNOSIS — F331 Major depressive disorder, recurrent, moderate: Secondary | ICD-10-CM | POA: Diagnosis not present

## 2021-01-27 NOTE — Progress Notes (Signed)
Virtual Visit via Telephone Note  I connected with [name] on 01/27/2021 at 10:00 am by phone enabled telehealth application and verified that I am speaking withthe correct person using two identifiers. Patient does not have a computer or Internet.   I discussed the limitations of evaluation and management by telemedicine and the availability of in person appointments. The patient expressed understanding and agreed to proceed.  LOCATION:  Patient: Home Provider: Home Office    History of Present Illness: Pt was referred for OP therapy by her neurologist Dr. [place] Lions for depression.   Observations/Objective: Patient presented for todays session on time and was alert, oriented x5, with no evidence or self-report of SI/HI or A/V H.  Patient reported ongoing compliance with medication and denied any use of alcohol or illicit substances. Clinician inquired about patients current emotional ratings, as well as any significant changes in thoughts, feelings or behavior since previous session.  Patient reported emotional ratings of 5/10 for depression, 3/10 for anxiety, 2/10 for anger/irritability.  Cln and pt reviewed emotional ratings and coping skills. Pt reports she had a 13-minute seizure since last session.  Cln and pt explored trigger for her seizure: talking to daughter on the phone who is negative and causes stress. Cln provided education on her how negativity causes patient stress which can induce a seizure, and the importance of positivity can impact moods. Cln encouraged pt to limit her calls to her daughter at this time. Pt was in agreement.   PLAN: CCA       Assessment and plan: Counselor will continue to meet with patient to address treatment plan goals. Patient will continue to follow recommendations of providers and implement skills learned in session.  Follow Up Instructions:   I discussed the assessment and treatment plan with the patient. The patient was provided an  opportunity to ask questions and all were answered. The patient agreed with the plan and demonstrated an understanding of the instructions.   The patient was advised to call back or seek an in-person evaluation if the symptoms worsen or if the condition fails to improve as anticipated.  I provided 45 minutes of non-face-to-face time during this encounter.   Jovon Winterhalter S, LCAS

## 2021-01-29 ENCOUNTER — Telehealth: Payer: Self-pay

## 2021-01-29 ENCOUNTER — Other Ambulatory Visit: Payer: Self-pay

## 2021-01-29 ENCOUNTER — Ambulatory Visit: Payer: Medicaid Other | Attending: Internal Medicine | Admitting: Physician Assistant

## 2021-01-29 DIAGNOSIS — R569 Unspecified convulsions: Secondary | ICD-10-CM

## 2021-01-29 DIAGNOSIS — I10 Essential (primary) hypertension: Secondary | ICD-10-CM | POA: Diagnosis not present

## 2021-01-29 DIAGNOSIS — R7303 Prediabetes: Secondary | ICD-10-CM

## 2021-01-29 MED ORDER — AMLODIPINE BESYLATE 10 MG PO TABS
ORAL_TABLET | Freq: Every day | ORAL | 0 refills | Status: DC
  Filled 2021-01-29: qty 90, fill #0
  Filled 2021-04-08: qty 90, 90d supply, fill #0

## 2021-01-29 MED ORDER — HYDRALAZINE HCL 50 MG PO TABS
50.0000 mg | ORAL_TABLET | Freq: Two times a day (BID) | ORAL | 6 refills | Status: DC
  Filled 2021-01-29 – 2021-04-08 (×2): qty 60, 30d supply, fill #0

## 2021-01-29 MED ORDER — HYDROCHLOROTHIAZIDE 25 MG PO TABS
ORAL_TABLET | Freq: Every day | ORAL | 0 refills | Status: DC
Start: 2021-01-29 — End: 2021-06-10
  Filled 2021-01-29: qty 90, fill #0
  Filled 2021-04-08: qty 90, 90d supply, fill #0

## 2021-01-29 MED ORDER — METFORMIN HCL 500 MG PO TABS
ORAL_TABLET | Freq: Every day | ORAL | 2 refills | Status: DC
  Filled 2021-01-29: qty 60, fill #0
  Filled 2021-04-08: qty 60, 60d supply, fill #0

## 2021-01-29 NOTE — Progress Notes (Signed)
Virtual Visit via Telephone Note  I connected with [name] on 01/29/21 at  8:30 AM EST by telephone and verified that I am speaking with the correct person using two identifiers.  Location: Patient: office Provider: [facility] office   I discussed the limitations, risks, security and privacy concerns of performing an evaluation and management service by telephone and the availability of in person appointments. I also discussed with the patient that there may be a patient responsible charge related to this service. The patient expressed understanding and agreed to proceed.   History of Present Illness: I am seeing [name] today bc she needs RF of some of her medications.  She is also due for bloodwork bc she has not had any in about 1 year.   No complaints.  No CP/SOB/dizziness   Observations/Objective: NAD.  A&Ox3  Assessment and Plan: 1. Essential hypertension Readings out of the office ~120/80.  At last record in Epic 10/22/2020=119/66 - hydrochlorothiazide (HYDRODIURIL) 25 MG tablet; TAKE 1 TABLET (25 MG TOTAL) BY MOUTH DAILY.  Dispense: 90 tablet; Refill: 0 - amLODipine (NORVASC) 10 MG tablet; TAKE 1 TABLET (10 MG TOTAL) BY MOUTH DAILY.  Dispense: 90 tablet; Refill: 0 - hydrALAZINE (APRESOLINE) 50 MG tablet; Take 1 tablet (50 mg total) by mouth 2 (two) times daily.  Dispense: 60 tablet; Refill: 6  2. Prediabetes I have had a lengthy discussion and provided education about insulin resistance and the intake of too much sugar/refined carbohydrates.  I have advised the patient to work at a goal of eliminating sugary drinks, candy, desserts, sweets, refined sugars, processed foods, and white carbohydrates.  The patient expresses understanding.   - metFORMIN (GLUCOPHAGE) 500 MG tablet; TAKE 1 TABLET (500 MG TOTAL) BY MOUTH DAILY WITH BREAKFAST.  Dispense: 60 tablet; Refill: 2 - Hemoglobin A1c - Comprehensive metabolic panel - Lipid panel  3. Seizures (HCC) On keppra and stable  w/o seizure activity   Follow Up Instructions: 3 months with PCP   I discussed the assessment and treatment plan with the patient. The patient was provided an opportunity to ask questions and all were answered. The patient agreed with the plan and demonstrated an understanding of the instructions.   The patient was advised to call back or seek an in-person evaluation if the symptoms worsen or if the condition fails to improve as anticipated.  I provided 17 minutes of non-face-to-face time during this encounter.   Georgian Co, PA-C  Patient ID: [name], female   DOB: 12-28-1955, 66 y.o.   MRN: [id number]

## 2021-01-29 NOTE — Telephone Encounter (Signed)
-----   Message from Anders Simmonds, [place] sent at 01/29/2021  8:38 AM EST ----- Needs PCP appt in 3 months please

## 2021-01-29 NOTE — Telephone Encounter (Signed)
Pt has been informed of follow up appointment.

## 2021-01-30 LAB — LIPID PANEL
Chol/HDL Ratio: 2.9 ratio (ref 0.0–4.4)
Cholesterol, Total: 144 mg/dL (ref 100–199)
HDL: 49 mg/dL (ref >39)
LDL Chol Calc (NIH): 68 mg/dL (ref 0–99)
Triglycerides: 155 mg/dL — ABNORMAL HIGH (ref 0–149)
VLDL Cholesterol Cal: 27 mg/dL (ref 5–40)

## 2021-01-30 LAB — COMPREHENSIVE METABOLIC PANEL
ALT: 15 IU/L (ref 0–32)
AST: 19 IU/L (ref 0–40)
Albumin/Globulin Ratio: 1.4 (ref 1.2–2.2)
Albumin: 4.9 g/dL — ABNORMAL HIGH (ref 3.8–4.8)
Alkaline Phosphatase: 110 IU/L (ref 44–121)
BUN/Creatinine Ratio: 22 (ref 12–28)
BUN: 25 mg/dL (ref 8–27)
Bilirubin Total: 0.3 mg/dL (ref 0.0–1.2)
CO2: 25 mmol/L (ref 20–29)
Calcium: 10.7 mg/dL — ABNORMAL HIGH (ref 8.7–10.3)
Chloride: 100 mmol/L (ref 96–106)
Creatinine, Ser: 1.14 mg/dL — ABNORMAL HIGH (ref 0.57–1.00)
Globulin, Total: 3.4 g/dL (ref 1.5–4.5)
Glucose: 86 mg/dL (ref 70–99)
Potassium: 4 mmol/L (ref 3.5–5.2)
Sodium: 144 mmol/L (ref 134–144)
Total Protein: 8.3 g/dL (ref 6.0–8.5)
eGFR: 54 mL/min/{1.73_m2} — ABNORMAL LOW (ref >59)

## 2021-01-30 LAB — HEMOGLOBIN A1C
Est. average glucose Bld gHb Est-mCnc: 126 mg/dL
Hgb A1c MFr Bld: 6 % — ABNORMAL HIGH (ref 4.8–5.6)

## 2021-02-03 ENCOUNTER — Encounter (HOSPITAL_COMMUNITY): Payer: Self-pay | Admitting: Licensed Clinical Social Worker

## 2021-02-03 ENCOUNTER — Other Ambulatory Visit: Payer: Self-pay

## 2021-02-03 ENCOUNTER — Ambulatory Visit (INDEPENDENT_AMBULATORY_CARE_PROVIDER_SITE_OTHER): Payer: Medicaid Other | Admitting: Licensed Clinical Social Worker

## 2021-02-03 DIAGNOSIS — F331 Major depressive disorder, recurrent, moderate: Secondary | ICD-10-CM | POA: Diagnosis not present

## 2021-02-03 NOTE — Progress Notes (Signed)
Virtual Visit via Telephone Note  I connected with [name] on 02/03/2021 at 10:00 am by phone enabled telehealth application and verified that I am speaking withthe correct person using two identifiers. Patient does not have a computer or Internet.   I discussed the limitations of evaluation and management by telemedicine and the availability of in person appointments. The patient expressed understanding and agreed to proceed.  LOCATION:  Patient: Home Provider: Home Office    History of Present Illness: Pt was referred for OP therapy by her neurologist Dr. [place] Lions for depression.   Observations/Objective: Patient presented for todays session on time and was alert, oriented x5, with no evidence or self-report of SI/HI or A/V H.  Patient reported ongoing compliance with medication and denied any use of alcohol or illicit substances. Clinician inquired about patients current emotional ratings, as well as any significant changes in thoughts, feelings or behavior since previous session.  Patient reported emotional ratings of 4/10 for depression, 3/10 for anxiety, 2/10 for anger/irritability.  Cln and pt reviewed emotional ratings and coping skills. Pt reports no seizures experienced since last session. Again, Cln and pt explored triggers for seizure: talking to daughter on the phone who is negative and causes stress. Cln utilized CBT for seizure prevention, including relaxation techniques and visual imagery.  PLAN: CCA       Assessment and plan: Counselor will continue to meet with patient to address treatment plan goals. Patient will continue to follow recommendations of providers and implement skills learned in session.  Follow Up Instructions:   I discussed the assessment and treatment plan with the patient. The patient was provided an opportunity to ask questions and all were answered. The patient agreed with the plan and demonstrated an understanding of the instructions.   The  patient was advised to call back or seek an in-person evaluation if the symptoms worsen or if the condition fails to improve as anticipated.  I provided 45 minutes of non-face-to-face time during this encounter.   Areen Trautner S, LCAS

## 2021-02-06 ENCOUNTER — Encounter: Payer: Self-pay | Admitting: *Deleted

## 2021-02-10 ENCOUNTER — Other Ambulatory Visit: Payer: Self-pay

## 2021-02-10 ENCOUNTER — Encounter (HOSPITAL_COMMUNITY): Payer: Self-pay | Admitting: Licensed Clinical Social Worker

## 2021-02-10 ENCOUNTER — Ambulatory Visit (INDEPENDENT_AMBULATORY_CARE_PROVIDER_SITE_OTHER): Payer: Medicaid Other | Admitting: Licensed Clinical Social Worker

## 2021-02-10 DIAGNOSIS — F331 Major depressive disorder, recurrent, moderate: Secondary | ICD-10-CM | POA: Diagnosis not present

## 2021-02-10 NOTE — Progress Notes (Signed)
Virtual Visit via Telephone Note  I connected with [name] on 02/10/2021 at 10:00 am by phone enabled telehealth application and verified that I am speaking withthe correct person using two identifiers. Patient does not have a computer or Internet.   I discussed the limitations of evaluation and management by telemedicine and the availability of in person appointments. The patient expressed understanding and agreed to proceed.  LOCATION:  Patient: Home Provider: Home Office    History of Present Illness: Pt was referred for OP therapy by her neurologist Dr. [place] Lions for depression.   Observations/Objective: Patient presented for todays session on time and was alert, oriented x5, with no evidence or self-report of SI/HI or A/V H.  Patient reported ongoing compliance with medication and denied any use of alcohol or illicit substances. Clinician inquired about patients current emotional ratings, as well as any significant changes in thoughts, feelings or behavior since previous session.  Patient reported emotional ratings of 5/10 for depression, 4/10 for anxiety, 2/10 for anger/irritability.  Cln and pt reviewed emotional ratings and coping skills. Pt reports no seizures experienced since last session. Pt reports a new stressor: Father of her child is in the hospital, Parkinsons and Syncope. Clinician engaged pt in discussion on identified positives and utilized MI, OARS to assess how these have impacted client's mood. Clinician allowed space for further processing of emotions and provided supportive statements throughout session.       PLAN: CCA       Assessment and plan: Counselor will continue to meet with patient to address treatment plan goals. Patient will continue to follow recommendations of providers and implement skills learned in session.  Follow Up Instructions:   I discussed the assessment and treatment plan with the patient. The patient was provided an opportunity to  ask questions and all were answered. The patient agreed with the plan and demonstrated an understanding of the instructions.   The patient was advised to call back or seek an in-person evaluation if the symptoms worsen or if the condition fails to improve as anticipated.  I provided 45 minutes of non-face-to-face time during this encounter.   Tyauna Lacaze S, LCAS

## 2021-02-12 ENCOUNTER — Telehealth: Payer: Self-pay | Admitting: Internal Medicine

## 2021-02-12 NOTE — Telephone Encounter (Signed)
Returned pt call and provide lab results pt is aware and doesn't have any questions or concerns

## 2021-02-12 NOTE — Telephone Encounter (Signed)
Copied from CRM [id number]. Topic: General - Other >> Feb 11, 2021 12:57 PM Randol Kern wrote: Reason for CRM: Pt returned call for lab results, please advise if PEC may disclose.

## 2021-02-17 ENCOUNTER — Other Ambulatory Visit: Payer: Self-pay

## 2021-02-17 ENCOUNTER — Encounter (HOSPITAL_COMMUNITY): Payer: Self-pay | Admitting: Licensed Clinical Social Worker

## 2021-02-17 ENCOUNTER — Ambulatory Visit (INDEPENDENT_AMBULATORY_CARE_PROVIDER_SITE_OTHER): Payer: Medicaid Other | Admitting: Licensed Clinical Social Worker

## 2021-02-17 DIAGNOSIS — F331 Major depressive disorder, recurrent, moderate: Secondary | ICD-10-CM

## 2021-02-17 NOTE — Addendum Note (Signed)
Addended by: Vernona Rieger on: 02/17/2021 11:01 AM   Modules accepted: Level of Service

## 2021-02-17 NOTE — Progress Notes (Signed)
Comprehensive Clinical Assessment (CCA) Note  Virtual Visit via Phone Note   I connected with [name] on 02/17/21 at 10:00-11:00 AM EST by a phone enabled telemedicine application and verified that I am speaking with the correct person using two identifiers.   Location: Patient: Home Provider: Home Office   I discussed the limitations of evaluation and management by telemedicine and the availability of in person appointments. The patient expressed understanding and agreed to proceed.       02/17/2021 [name] [id number]  Chief Complaint:  Chief Complaint  Patient presents with   Depression   Visit Diagnosis: Depression    CCA Screening, Triage and Referral (STR)  Patient Reported Information How did you hear about [place]? No data recorded Referral name: No data recorded Referral phone number: No data recorded  Whom do you see for routine medical problems? No data recorded Practice/Facility Name: No data recorded Practice/Facility Phone Number: No data recorded Name of Contact: No data recorded Contact Number: No data recorded Contact Fax Number: No data recorded Prescriber Name: No data recorded Prescriber Address (if known): No data recorded  What Is the Reason for Your Visit/Call Today? No data recorded How Long Has This Been Causing You Problems? No data recorded What Do You Feel Would Help You the Most Today? No data recorded  Have You Recently Been in Any Inpatient Treatment (Hospital/Detox/Crisis Center/28-Day Program)? No data recorded Name/Location of Program/Hospital:No data recorded How Long Were You There? No data recorded When Were You Discharged? No data recorded  Have You Ever Received Services From [facility] Before? No data recorded Who Do You See at [facility]? No data recorded  Have You Recently Had Any Thoughts About Hurting Yourself? No data recorded Are You Planning to Commit Suicide/Harm Yourself At This time? No data  recorded  Have you Recently Had Thoughts About Hurting Someone Karolee Ohs? No data recorded Explanation: No data recorded  Have You Used Any Alcohol or Drugs in the Past 24 Hours? No data recorded How Long Ago Did You Use Drugs or Alcohol? No data recorded What Did You Use and How Much? No data recorded  Do You Currently Have a Therapist/Psychiatrist? No data recorded Name of Therapist/Psychiatrist: No data recorded  Have You Been Recently Discharged From Any Office Practice or Programs? No data recorded Explanation of Discharge From Practice/Program: No data recorded    CCA Screening Triage Referral Assessment Type of Contact: No data recorded Is this Initial or Reassessment? No data recorded Date Telepsych consult ordered in [facility]:  No data recorded Time Telepsych consult ordered in [facility]:  No data recorded  Patient Reported Information Reviewed? No data recorded Patient Left Without Being Seen? No data recorded Reason for Not Completing Assessment: No data recorded  Collateral Involvement: No data recorded  Does Patient Have a Court Appointed Legal Guardian? No data recorded Name and Contact of Legal Guardian: No data recorded If Minor and Not Living with Parent(s), Who has Custody? No data recorded Is CPS involved or ever been involved? No data recorded Is APS involved or ever been involved? No data recorded  Patient Determined To Be At Risk for Harm To Self or Others Based on Review of Patient Reported Information or Presenting Complaint? No data recorded Method: No data recorded Availability of Means: No data recorded Intent: No data recorded Notification Required: No data recorded Additional Information for Danger to Others Potential: No data recorded Additional Comments for Danger to Others Potential: No data recorded Are There Guns  or Other Weapons in Your Home? No data recorded Types of Guns/Weapons: No data recorded Are These Weapons Safely Secured?                             No data recorded Who Could Verify You Are Able To Have These Secured: No data recorded Do You Have any Outstanding Charges, Pending Court Dates, Parole/Probation? No data recorded Contacted To Inform of Risk of Harm To Self or Others: No data recorded  Location of Assessment: No data recorded  Does Patient Present under Involuntary Commitment? No data recorded IVC Papers Initial File Date: No data recorded  [place]  Patient Currently Receiving the Following Services: No data recorded  Determination of Need: No data recorded  Options For Referral: No data recorded    CCA Biopsychosocial Intake/Chief Complaint:  Patient continues to have stressors in her life and experiences anxiety and depression  Current Symptoms/Problems: anxious and depressed   Patient Reported Schizophrenia/Schizoaffective Diagnosis in Past: No   Strengths: motivated, family support  Preferences: prefers to not have these symptoms  Abilities: ability to work through her symptoms   Type of Services Patient Feels are Needed: OP services, medication management   Initial Clinical Notes/Concerns: No data recorded  Mental Health Symptoms Depression:   Change in energy/activity; Fatigue; Irritability   Duration of Depressive symptoms:  Greater than two weeks   Mania:   N/A   Anxiety:    Fatigue; Irritability; Worrying   Psychosis:   None   Duration of Psychotic symptoms: No data recorded  Trauma:   Avoids reminders of event; Guilt/shame   Obsessions:   N/A   Compulsions:   N/A   Inattention:   N/A   Hyperactivity/Impulsivity:   N/A   Oppositional/Defiant Behaviors:   N/A   Emotional Irregularity:   N/A   Other Mood/Personality Symptoms:  No data recorded   Mental Status Exam Appearance and self-care  Stature:   Average   Weight:   Average weight   Clothing:   Casual   Grooming:   Normal   Cosmetic use:   None   Posture/gait:    Normal   Motor activity:   Not Remarkable   Sensorium  Attention:   Normal   Concentration:   Anxiety interferes   Orientation:   X5   Recall/memory:   Normal   Affect and Mood  Affect:   Depressed   Mood:   Depressed   Relating  Eye contact:   Normal   Facial expression:   Responsive   Attitude toward examiner:   Cooperative   Thought and Language  Speech flow:  Normal   Thought content:   Appropriate to Mood and Circumstances   Preoccupation:   Ruminations   Hallucinations:   None   Organization:  No data recorded  Affiliated Computer ServicesExecutive Functions  Fund of Knowledge:   Impoverished by (Comment)   Intelligence:   Below average   Abstraction:   Normal   Judgement:   Normal   Reality Testing:   Adequate   Insight:   Fair   Decision Making:   Normal   Social Functioning  Social Maturity:   Responsible   Social Judgement:   Naive   Stress  Stressors:   Family conflict; Financial   Coping Ability:   Deficient supports   Skill Deficits:   Intellect/education   Supports:   Family     Religion:  Religion/Spirituality Are You A Religious Person?: Yes What is Your Religious Affiliation?: Non-Denominational  Leisure/Recreation: Leisure / Recreation Do You Have Hobbies?: No  Exercise/Diet: Exercise/Diet Do You Exercise?: Yes What Type of Exercise Do You Do?: Run/Walk How Many Times a Week Do You Exercise?: 1-3 times a week Have You Gained or Lost A Significant Amount of Weight in the Past Six Months?: No Do You Follow a Special Diet?: No Do You Have Any Trouble Sleeping?: No   CCA Employment/Education Employment/Work Situation: Employment / Work Systems developerituation Employment Situation: On disability Why is Patient on Disability: seizures and mental illness How Long has Patient Been on Disability: 6 years Patient's Job has Been Impacted by Current Illness: No What is the Longest Time Patient has Held a Job?: pt has never held a  job due to her seizures Has Patient ever Been in the [employer]ancorp[employer]?: No  Education: Education Is Patient Currently Attending School?: No Last Grade Completed: 12 Did Garment/textile technologistYou Graduate From [employer] School?: Yes Did Theme park managerYou Attend College?: No Did You Attend Graduate School?: No Did You Have An Individualized Education Program (IIEP): Yes Did You Have Any Difficulty At School?: Yes Were Any Medications Ever Prescribed For These Difficulties?: No Patient's Education Has Been Impacted by Current Illness: No   CCA Family/Childhood History Family and Relationship History: Family history Marital status: Divorced What types of issues is patient dealing with in the relationship?: none Are you sexually active?: No Does patient have children?: Yes How many children?: 1 How is patient's relationship with their children?: patient's daughter causes her stress  Childhood History:  Childhood History By whom was/is the patient raised?: Both parents Additional childhood history information: both parents, [place] stay with grandmother until age 66 Description of patient's relationship with caregiver when they were a child: good relationship with both parents Patient's description of current relationship with people who raised him/her: both parents are deceased How were you disciplined when you got in trouble as a child/adolescent?: whipping Does patient have siblings?: Yes Number of Siblings: 3 Description of patient's current relationship with siblings: excellent relationship with sister who lives in [place], brother addict Did patient suffer any verbal/emotional/physical/sexual abuse as a child?: No Did patient suffer from severe childhood neglect?: No Has patient ever been sexually abused/assaulted/raped as an adolescent or adult?: No Was the patient ever a victim of a crime or a disaster?: No Witnessed domestic violence?: No Has patient been affected by domestic  violence as an adult?: No  Child/Adolescent Assessment:     CCA Substance Use Alcohol/Drug Use: Alcohol / Drug Use History of alcohol / drug use?: No history of alcohol / drug abuse                         ASAM's:  Six Dimensions of Multidimensional Assessment  Dimension 1:  Acute Intoxication and/or Withdrawal Potential:      Dimension 2:  Biomedical Conditions and Complications:      Dimension 3:  Emotional, Behavioral, or Cognitive Conditions and Complications:     Dimension 4:  Readiness to Change:     Dimension 5:  Relapse, Continued use, or Continued Problem Potential:     Dimension 6:  Recovery/Living Environment:     ASAM Severity Score:    ASAM Recommended Level of Treatment:     Substance use Disorder (SUD)    Recommendations for Services/Supports/Treatments: Recommendations for Services/Supports/Treatments Recommendations For Services/Supports/Treatments: Individual Therapy, Medication Management  DSM5 Diagnoses: Patient Active Problem List   Diagnosis Date Noted   Influenza vaccine refused 12/26/2019   Difficulty articulating words 03/07/2019   Adjustment disorder with anxious mood 08/18/2018   Obesity (BMI 30-39.9) 09/17/2016   Internal hemorrhoid 09/16/2016   Adjustment disorder with mixed anxiety and depressed mood 12/04/2015   Primary insomnia 12/04/2015   Prediabetes 10/09/2013   Facial droop 10/09/2013   Seizures (HCC) 08/24/2013   Obesity, unspecified 10/17/2007   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

## 2021-02-24 ENCOUNTER — Ambulatory Visit (INDEPENDENT_AMBULATORY_CARE_PROVIDER_SITE_OTHER): Payer: Medicaid Other | Admitting: Licensed Clinical Social Worker

## 2021-02-24 ENCOUNTER — Encounter (HOSPITAL_COMMUNITY): Payer: Self-pay | Admitting: Licensed Clinical Social Worker

## 2021-02-24 ENCOUNTER — Other Ambulatory Visit: Payer: Self-pay

## 2021-02-24 DIAGNOSIS — F331 Major depressive disorder, recurrent, moderate: Secondary | ICD-10-CM | POA: Diagnosis not present

## 2021-02-24 NOTE — Progress Notes (Signed)
Virtual Visit via Telephone Note  I connected with [name] on 02/24/2021 at 10:00 am by phone enabled telehealth application and verified that I am speaking withthe correct person using two identifiers. Patient does not have a computer or Internet.   I discussed the limitations of evaluation and management by telemedicine and the availability of in person appointments. The patient expressed understanding and agreed to proceed.  LOCATION:  Patient: Home Provider: Home Office    History of Present Illness: Pt was referred for OP therapy by her neurologist Dr. Steilacoom Lions for depression.   Observations/Objective: Patient presented for todays session on time and was alert, oriented x5, with no evidence or self-report of SI/HI or A/V H.  Patient reported ongoing compliance with medication and denied any use of alcohol or illicit substances. Clinician inquired about patients current emotional ratings, as well as any significant changes in thoughts, feelings or behavior since previous session.  Patient reported emotional ratings of 5/10 for depression, 4/10 for anxiety, 2/10 for anger/irritability.  Cln and pt reviewed emotional ratings and coping skills. Pt reports no seizures experienced since last session. Pt reports on her current stressors.  Pt provided an update on family, moving to [place], daughter/grandchildren, depressive symptoms due to weather. Clinician utilized MI OARS to reflect and summarize thoughts and feelings.    Assessment and plan: Counselor will continue to meet with patient to address treatment plan goals. Patient will continue to follow recommendations of providers and implement skills learned in session.  Follow Up Instructions:   I discussed the assessment and treatment plan with the patient. The patient was provided an opportunity to ask questions and all were answered. The patient agreed with the plan and demonstrated an understanding of the instructions.   The  patient was advised to call back or seek an in-person evaluation if the symptoms worsen or if the condition fails to improve as anticipated.  I provided 45 minutes of non-face-to-face time during this encounter.   [name], LCAS

## 2021-02-25 ENCOUNTER — Other Ambulatory Visit: Payer: Self-pay | Admitting: Internal Medicine

## 2021-02-25 DIAGNOSIS — Z1231 Encounter for screening mammogram for malignant neoplasm of breast: Secondary | ICD-10-CM

## 2021-03-03 ENCOUNTER — Other Ambulatory Visit: Payer: Self-pay

## 2021-03-03 ENCOUNTER — Ambulatory Visit (INDEPENDENT_AMBULATORY_CARE_PROVIDER_SITE_OTHER): Payer: Medicaid Other | Admitting: Licensed Clinical Social Worker

## 2021-03-03 ENCOUNTER — Encounter (HOSPITAL_COMMUNITY): Payer: Self-pay | Admitting: Licensed Clinical Social Worker

## 2021-03-03 DIAGNOSIS — F331 Major depressive disorder, recurrent, moderate: Secondary | ICD-10-CM

## 2021-03-03 NOTE — Progress Notes (Signed)
Virtual Visit via Telephone Note  I connected with [name] on 03/03/2021 at 10:00 am by phone enabled telehealth application and verified that I am speaking withthe correct person using two identifiers. Patient does not have a computer or Internet.   I discussed the limitations of evaluation and management by telemedicine and the availability of in person appointments. The patient expressed understanding and agreed to proceed.  LOCATION:  Patient: Home Provider: Home Office    History of Present Illness: Pt was referred for OP therapy by her neurologist Dr. [place] [name] for depression.   Observations/Objective: Patient presented for todays session on time and was alert, oriented x5, with no evidence or self-report of SI/HI or A/V H.  Patient reported ongoing compliance with medication and denied any use of alcohol or illicit substances. Clinician inquired about patients current emotional ratings, as well as any significant changes in thoughts, feelings or behavior since previous session.  Patient reported emotional ratings of 4/10 for depression, 4/10 for anxiety, 1/10 for anger/irritability.  Cln and pt reviewed emotional ratings and coping skills. Pt reports no seizures experienced since last session. Pt reports on her current stressors.  Pt provided an update on family, moving to [place], daughter/grandchildren, depressive symptoms due to weather. Pt reports the government is at her place today to do inspection looking for drugs. "This has me stressed." Cln and pt role played deep breathing exercises and visualizations.     Assessment and plan: Counselor will continue to meet with patient to address treatment plan goals. Patient will continue to follow recommendations of providers and implement skills learned in session.  Follow Up Instructions:   I discussed the assessment and treatment plan with the patient. The patient was provided an opportunity to ask questions and all were answered.  The patient agreed with the plan and demonstrated an understanding of the instructions.   The patient was advised to call back or seek an in-person evaluation if the symptoms worsen or if the condition fails to improve as anticipated.  I provided 45 minutes of non-face-to-face time during this encounter.   [name], LCAS

## 2021-03-04 ENCOUNTER — Ambulatory Visit: Payer: Medicaid Other | Admitting: Podiatry

## 2021-03-04 ENCOUNTER — Other Ambulatory Visit: Payer: Self-pay

## 2021-03-04 DIAGNOSIS — B351 Tinea unguium: Secondary | ICD-10-CM | POA: Diagnosis not present

## 2021-03-04 DIAGNOSIS — E119 Type 2 diabetes mellitus without complications: Secondary | ICD-10-CM

## 2021-03-04 DIAGNOSIS — M79674 Pain in right toe(s): Secondary | ICD-10-CM | POA: Diagnosis not present

## 2021-03-04 DIAGNOSIS — M79675 Pain in left toe(s): Secondary | ICD-10-CM | POA: Diagnosis not present

## 2021-03-08 NOTE — Progress Notes (Signed)
Subjective: 66 year old female presents the office with concerns of thick, discolored toenails that she cannot trim herself and they are causing discomfort.  She has calluses present to her feet that are causing discomfort as well.  She has no new concerns today.    Marcine Matar, MD  Objective: AAO x3, NAD DP/PT pulses palpable bilaterally, CRT less than 3 seconds Nails are hypertrophic, dystrophic, brittle, discolored, elongated 10. No surrounding redness or drainage. Tenderness nails 1-5 bilaterally. Hyperkeratotic lesion present right submetatarsal 1 as well as bilateral medial rear foot and medial hallux bilaterally.  There is no underlying ulceration drainage or any signs of infection. There is a annular hyper pigmented lesion present on the right third toe consistent with a mole.  She states this has been there for many years and has not changed.  There is no ulceration noted. No open lesions or pre-ulcerative lesions are identified today. No pain with calf compression, swelling, warmth, erythema  Assessment: Type 2 diabetes with neuropathy, symptomatic onychomycosis/hyperkeratotic lesions  Plan: -All treatment options discussed with the patient including all alternatives, risks, complications.  -Sharply debrided nails x10 without any complications or bleeding -Hyperkeratotic lesion sharply debrided x5 without any complications or bleeding as a courtesy as it was minimal.  -Hyperpigmented lesion on the right third toe which has been ongoing for many years without change.  Continue to monitor. -Discussed daily foot inspection -Patient encouraged to call the office with any questions, concerns, change in symptoms.   Vivi Barrack DPM

## 2021-03-10 ENCOUNTER — Encounter (HOSPITAL_COMMUNITY): Payer: Self-pay | Admitting: Licensed Clinical Social Worker

## 2021-03-10 ENCOUNTER — Other Ambulatory Visit: Payer: Self-pay

## 2021-03-10 ENCOUNTER — Ambulatory Visit (INDEPENDENT_AMBULATORY_CARE_PROVIDER_SITE_OTHER): Payer: Medicaid Other | Admitting: Licensed Clinical Social Worker

## 2021-03-10 DIAGNOSIS — F331 Major depressive disorder, recurrent, moderate: Secondary | ICD-10-CM

## 2021-03-10 NOTE — Progress Notes (Signed)
Virtual Visit via Telephone Note  I connected with [name] on 03/10/2021 at 10:00 am by phone enabled telehealth application and verified that I am speaking withthe correct person using two identifiers. Patient does not have a computer or Internet.   I discussed the limitations of evaluation and management by telemedicine and the availability of in person appointments. The patient expressed understanding and agreed to proceed.  LOCATION:  Patient: Home Provider: Home Office    History of Present Illness: Pt was referred for OP therapy by her neurologist Dr. [place] [name] for depression.   Observations/Objective: Patient presented for todays session on time and was alert, oriented x5, with no evidence or self-report of SI/HI or A/V H.  Patient reported ongoing compliance with medication and denied any use of alcohol or illicit substances. Clinician inquired about patients current emotional ratings, as well as any significant changes in thoughts, feelings or behavior since previous session.  Patient reported emotional ratings of 2/10 for depression, 4/10 for anxiety, 1/10 for anger/irritability.  Cln and pt reviewed emotional ratings and coping skills. Pt reports no seizures experienced since last session. Pt reports on her current stressors.  Pt provided an update on family, moving to [facility], daughter/grandchildren, depressive symptoms have improved due to more sunshine. Pt reports the government inspection looking for drugs last week was very stressful. "They had drug dogs, Federal police and [employer], and [employer]. I used my breathing exercises, which workedBiomedical scientist and pt discussed her exercise regimen (walking) which she stopped.  Cln encouraged pt to re-start her walking for fresh air, vitamin D, socialization, exercise.     Collaboration of Care: Other: No collaboration of care during this counseling session.  Patient/Guardian was advised Release of Information must be obtained prior to any record  release in order to collaborate their care with an outside provider. Patient/Guardian was advised if they have not already done so to contact the registration department to sign all necessary forms in order for [place] to release information regarding their care.   Consent: Patient/Guardian gives verbal consent for treatment and assignment of benefits for services provided during this visit. Patient/Guardian expressed understanding and agreed to proceed.      Assessment and plan: Counselor will continue to meet with patient to address treatment plan goals. Patient will continue to follow recommendations of providers and implement skills learned in session.  Follow Up Instructions:   I discussed the assessment and treatment plan with the patient. The patient was provided an opportunity to ask questions and all were answered. The patient agreed with the plan and demonstrated an understanding of the instructions.   The patient was advised to call back or seek an in-person evaluation if the symptoms worsen or if the condition fails to improve as anticipated.  I provided 45 minutes of non-face-to-face time during this encounter.   [name], LCAS

## 2021-03-17 ENCOUNTER — Encounter (HOSPITAL_COMMUNITY): Payer: Self-pay | Admitting: Licensed Clinical Social Worker

## 2021-03-17 ENCOUNTER — Ambulatory Visit (INDEPENDENT_AMBULATORY_CARE_PROVIDER_SITE_OTHER): Payer: Medicaid Other | Admitting: Licensed Clinical Social Worker

## 2021-03-17 ENCOUNTER — Other Ambulatory Visit: Payer: Self-pay

## 2021-03-17 DIAGNOSIS — F331 Major depressive disorder, recurrent, moderate: Secondary | ICD-10-CM | POA: Diagnosis not present

## 2021-03-17 NOTE — Progress Notes (Signed)
Virtual Visit via Telephone Note  I connected with [name] on 03/17/2021 at 10:00 am by phone enabled telehealth application and verified that I am speaking withthe correct person using two identifiers. Patient does not have a computer or Internet.   I discussed the limitations of evaluation and management by telemedicine and the availability of in person appointments. The patient expressed understanding and agreed to proceed.  LOCATION:  Patient: Home Provider: Home Office  Treatment Goal Addressed: Develop the ability to recognize, accept, and cope with feelings of depression AEB self report.  Progress Towards Goal: Progressing   History of Present Illness: Pt was referred for OP therapy by her neurologist Dr. [place] Lions for depression.   Observations/Objective: Patient presented for todays session on time and was alert, oriented x5, with no evidence or self-report of SI/HI or A/V H.  Patient reported ongoing compliance with medication and denied any use of alcohol or illicit substances. Clinician inquired about patients current emotional ratings, as well as any significant changes in thoughts, feelings or behavior since previous session.  Patient reported emotional ratings of 4/10 for depression, 4/10 for anxiety, 1/10 for anger/irritability.  Cln and pt reviewed emotional ratings and coping skills. Pt reports no seizures experienced since last session. Pt reports on her current stressors.  Pt provided an update on family, moving to [facility], [employer]. "I've decided not to move to [facility]." Pt reports tearfully. Cl and pt explore reasons for not moving. Cln asked open ended questions.  Pt's sister came here to meet with pt about her move and pt decided she would remain in [place]. Clinician utilized [place] OARS to reflect and summarize thoughts and feelings. Cln and pt discussed her exercise regimen (walking) which she stopped.  Cln encouraged pt to re-start her walking for  fresh air, vitamin D, socialization, exercise.     Collaboration of Care: Other: No collaboration of care during this counseling session.  Patient/Guardian was advised Release of Information must be obtained prior to any record release in order to collaborate their care with an outside provider. Patient/Guardian was advised if they have not already done so to contact the registration department to sign all necessary forms in order for [place] to release information regarding their care.   Consent: Patient/Guardian gives verbal consent for treatment and assignment of benefits for services provided during this visit. Patient/Guardian expressed understanding and agreed to proceed.      Assessment and plan: Counselor will continue to meet with patient to address treatment plan goals. Patient will continue to follow recommendations of providers and implement skills learned in session.  Follow Up Instructions:   I discussed the assessment and treatment plan with the patient. The patient was provided an opportunity to ask questions and all were answered. The patient agreed with the plan and demonstrated an understanding of the instructions.   The patient was advised to call back or seek an in-person evaluation if the symptoms worsen or if the condition fails to improve as anticipated.  I provided 45 minutes of non-face-to-face time during this encounter.   Creedon Danielski S, LCAS

## 2021-03-24 ENCOUNTER — Ambulatory Visit (INDEPENDENT_AMBULATORY_CARE_PROVIDER_SITE_OTHER): Payer: Medicaid Other | Admitting: Licensed Clinical Social Worker

## 2021-03-24 ENCOUNTER — Other Ambulatory Visit: Payer: Self-pay

## 2021-03-24 DIAGNOSIS — F331 Major depressive disorder, recurrent, moderate: Secondary | ICD-10-CM

## 2021-03-25 ENCOUNTER — Ambulatory Visit
Admission: RE | Admit: 2021-03-25 | Discharge: 2021-03-25 | Disposition: A | Discharge status: Home / Self Care | Payer: Medicaid Other | Source: Ambulatory Visit | Attending: Internal Medicine | Admitting: Internal Medicine

## 2021-03-25 DIAGNOSIS — Z1231 Encounter for screening mammogram for malignant neoplasm of breast: Secondary | ICD-10-CM | POA: Diagnosis not present

## 2021-03-26 ENCOUNTER — Encounter (HOSPITAL_COMMUNITY): Payer: Self-pay | Admitting: Licensed Clinical Social Worker

## 2021-03-26 NOTE — Progress Notes (Signed)
Virtual Visit via Telephone Note ? ?I connected with [name] on 03/24/2021 at 10:00 am by phone enabled telehealth application and verified that I am speaking withthe correct person using two identifiers. Patient does not have a computer or Internet. ?  ?I discussed the limitations of evaluation and management by telemedicine and the availability of in person appointments. The patient expressed understanding and agreed to proceed. ? ?LOCATION:  ?Patient: Home ?Provider: Home Office ? ?Treatment Goal Addressed: Develop the ability to recognize, accept, and cope with feelings of depression AEB self report. ? ?Progress Towards Goal: Progressing ? ? ?History of Present Illness: Pt was referred for OP therapy by her neurologist Dr. [place] Lions for depression. ?  ?Observations/Objective: Patient presented for today?s session on time and was alert, oriented x5, with no evidence or self-report of SI/HI or A/V H.  Patient reported ongoing compliance with medication and denied any use of alcohol or illicit substances. Clinician inquired about patient?s current emotional ratings, as well as any significant changes in thoughts, feelings or behavior since previous session.  Patient reported emotional ratings of 4/10 for depression, 4/10 for anxiety, 1/10 for anger/irritability.  Cln and pt reviewed emotional ratings and coping skills. Pt reports no seizures experienced since last session. Pt reports on her current stressors.  Pt provided an update on family, daughter/grandchildren. Pt reports, "My daughter and her children are struggling financially in [place]. They are my biggest stressors. I have tried to stay away from their drama."  ?Clinician utilized [place] OARS to reflect and summarize thoughts and feelings. ?Cln and pt discussed her exercise regimen (walking) which she she has re-started!  Cln congratulated pt on restarting her walking daily. ? ? ? ?Collaboration of Care: Other: No collaboration of care during this  counseling session. ? ?Patient/Guardian was advised Release of Information must be obtained prior to any record release in order to collaborate their care with an outside provider. Patient/Guardian was advised if they have not already done so to contact the registration department to sign all necessary forms in order for [place] to release information regarding their care.  ? ?Consent: Patient/Guardian gives verbal consent for treatment and assignment of benefits for services provided during this visit. Patient/Guardian expressed understanding and agreed to proceed.   ? ? ? ?Assessment and plan: Counselor will continue to meet with patient to address treatment plan goals. Patient will continue to follow recommendations of providers and implement skills learned in session. ? ?Follow Up Instructions:   ?I discussed the assessment and treatment plan with the patient. The patient was provided an opportunity to ask questions and all were answered. The patient agreed with the plan and demonstrated an understanding of the instructions. ?  ?The patient was advised to call back or seek an in-person evaluation if the symptoms worsen or if the condition fails to improve as anticipated. ? ?I provided 45 minutes of non-face-to-face time during this encounter. ? ? ?Tynasia Mccaul S, LCAS ? ? ?

## 2021-03-31 ENCOUNTER — Ambulatory Visit (HOSPITAL_COMMUNITY): Payer: Medicaid Other | Admitting: Licensed Clinical Social Worker

## 2021-03-31 ENCOUNTER — Other Ambulatory Visit: Payer: Self-pay

## 2021-03-31 ENCOUNTER — Ambulatory Visit (INDEPENDENT_AMBULATORY_CARE_PROVIDER_SITE_OTHER): Payer: Medicaid Other | Admitting: Licensed Clinical Social Worker

## 2021-03-31 DIAGNOSIS — F331 Major depressive disorder, recurrent, moderate: Secondary | ICD-10-CM

## 2021-04-02 ENCOUNTER — Encounter (HOSPITAL_COMMUNITY): Payer: Self-pay | Admitting: Licensed Clinical Social Worker

## 2021-04-02 NOTE — Progress Notes (Signed)
Virtual Visit via Telephone Note ? ?I connected with [name] on 03/24/2021 at 10:00 am by phone enabled telehealth application and verified that I am speaking withthe correct person using two identifiers. Patient does not have a computer or Internet. ?  ?I discussed the limitations of evaluation and management by telemedicine and the availability of in person appointments. The patient expressed understanding and agreed to proceed. ? ?LOCATION:  ?Patient: Home ?Provider: Home Office ? ?Treatment Goal Addressed: Develop the ability to recognize, accept, and cope with feelings of depression AEB self report. ? ?Progress Towards Goal: Progressing ? ? ?History of Present Illness: Pt was referred for OP therapy by her neurologist Dr.  Lions for depression. ?  ?Observations/Objective: Patient presented for today?s session on time and was alert, oriented x5, with no evidence or self-report of SI/HI or A/V H.  Patient reported ongoing compliance with medication and denied any use of alcohol or illicit substances. Clinician inquired about patient?s current emotional ratings, as well as any significant changes in thoughts, feelings or behavior since previous session.  Patient reported emotional ratings of 3/10 for depression, 4/10 for anxiety, 3/10 for anger/irritability.  Cln and pt reviewed emotional ratings and coping skills. Pt reports no seizures experienced since last session. Pt reports on her current stressors.  Pt provided an update on family, daughter/grandchildren. Pt reports, "I'm glad that I didn't go to [place] to help my daughter and her children during spring break. They continue to be my biggest stressors. I have tried to stay away from their drama." Cln asked open-ended questions about her decision to even go to [place]. Cln provided education on using mindfulness-based stress response to assist patient in lowering her stress response. Patient was encouraged to use mindfulness skills as a coping skill. Cln  encouraged pt continue her exercise regimen (walking) which she she continues. ? ? ? ?Collaboration of Care: Other: No collaboration of care during this counseling session. ? ?Patient/Guardian was advised Release of Information must be obtained prior to any record release in order to collaborate their care with an outside provider. Patient/Guardian was advised if they have not already done so to contact the registration department to sign all necessary forms in order for [place] to release information regarding their care.  ? ?Consent: Patient/Guardian gives verbal consent for treatment and assignment of benefits for services provided during this visit. Patient/Guardian expressed understanding and agreed to proceed.   ? ? ? ?Assessment and plan: Counselor will continue to meet with patient to address treatment plan goals. Patient will continue to follow recommendations of providers and implement skills learned in session. ? ?Follow Up Instructions:   ?I discussed the assessment and treatment plan with the patient. The patient was provided an opportunity to ask questions and all were answered. The patient agreed with the plan and demonstrated an understanding of the instructions. ?  ?The patient was advised to call back or seek an in-person evaluation if the symptoms worsen or if the condition fails to improve as anticipated. ? ?I provided 45 minutes of non-face-to-face time during this encounter. ? ? ?Willian Donson S, LCAS ? ? ?

## 2021-04-03 ENCOUNTER — Telehealth (HOSPITAL_COMMUNITY): Payer: Medicaid Other | Admitting: Psychiatry

## 2021-04-07 ENCOUNTER — Other Ambulatory Visit: Payer: Self-pay

## 2021-04-07 ENCOUNTER — Ambulatory Visit (INDEPENDENT_AMBULATORY_CARE_PROVIDER_SITE_OTHER): Payer: Medicaid Other | Admitting: Licensed Clinical Social Worker

## 2021-04-07 DIAGNOSIS — F331 Major depressive disorder, recurrent, moderate: Secondary | ICD-10-CM | POA: Diagnosis not present

## 2021-04-08 ENCOUNTER — Other Ambulatory Visit: Payer: Self-pay

## 2021-04-08 ENCOUNTER — Encounter (HOSPITAL_COMMUNITY): Payer: Self-pay | Admitting: Licensed Clinical Social Worker

## 2021-04-08 NOTE — Progress Notes (Signed)
Virtual Visit via Telephone Note ? ?I connected with [name] on 03/24/2021 at 10:00 am by phone enabled telehealth application and verified that I am speaking withthe correct person using two identifiers. Patient does not have a computer or Internet. ?  ?I discussed the limitations of evaluation and management by telemedicine and the availability of in person appointments. The patient expressed understanding and agreed to proceed. ? ?LOCATION:  ?Patient: Home ?Provider: Home Office ? ?Treatment Goal Addressed: Develop the ability to recognize, accept, and cope with feelings of depression AEB self report. ? ?Progress Towards Goal: Progressing ? ? ?History of Present Illness: Pt was referred for OP therapy by her neurologist Dr. [place] Crumb for depression. ?  ?Observations/Objective: Patient presented for today?s session on time and was alert, oriented x5, with no evidence or self-report of SI/HI or A/V H.  Patient reported ongoing compliance with medication and denied any use of alcohol or illicit substances. Clinician inquired about patient?s current emotional ratings, as well as any significant changes in thoughts, feelings or behavior since previous session.  Patient reported emotional ratings of 3/10 for depression, 4/10 for anxiety, 3/10 for anger/irritability.  Cln and pt reviewed emotional ratings and coping skills. Pt reports no seizures experienced since last session. Pt reports on her current stressors.  Pt provided an update on family, daughter/grandchildren. Pt reports, "My daughter and grandchildren drove back to [place] from [place] for spring break. We didn't talk long I didn't want to get mixed up in her drama, which will increase my emotional ratings and maybe move into a seizure." Cln encouraged  pt to use mindfulness skills as a coping skill. Cln encouraged pt continue her exercise regimen (walking) which she she continues. ? ? ? ?Collaboration of Care: Other: No collaboration of care during  this counseling session. ? ?Patient/Guardian was advised Release of Information must be obtained prior to any record release in order to collaborate their care with an outside provider. Patient/Guardian was advised if they have not already done so to contact the registration department to sign all necessary forms in order for [place] to release information regarding their care.  ? ?Consent: Patient/Guardian gives verbal consent for treatment and assignment of benefits for services provided during this visit. Patient/Guardian expressed understanding and agreed to proceed.   ? ? ? ?Assessment and plan: Counselor will continue to meet with patient to address treatment plan goals. Patient will continue to follow recommendations of providers and implement skills learned in session. ? ?Follow Up Instructions:   ?I discussed the assessment and treatment plan with the patient. The patient was provided an opportunity to ask questions and all were answered. The patient agreed with the plan and demonstrated an understanding of the instructions. ?  ?The patient was advised to call back or seek an in-person evaluation if the symptoms worsen or if the condition fails to improve as anticipated. ? ?I provided 45 minutes of non-face-to-face time during this encounter. ? ? ?Sharvil Hoey S, LCAS ? ? ?

## 2021-04-10 ENCOUNTER — Other Ambulatory Visit: Payer: Self-pay

## 2021-04-10 ENCOUNTER — Telehealth (HOSPITAL_BASED_OUTPATIENT_CLINIC_OR_DEPARTMENT_OTHER): Payer: Medicaid Other | Admitting: Psychiatry

## 2021-04-10 DIAGNOSIS — F5101 Primary insomnia: Secondary | ICD-10-CM | POA: Diagnosis not present

## 2021-04-10 DIAGNOSIS — F331 Major depressive disorder, recurrent, moderate: Secondary | ICD-10-CM

## 2021-04-10 MED ORDER — MIRTAZAPINE 30 MG PO TABS
30.0000 mg | ORAL_TABLET | Freq: Every day | ORAL | 0 refills | Status: DC
  Filled 2021-04-10: qty 90, 90d supply, fill #0

## 2021-04-10 NOTE — Progress Notes (Signed)
Virtual Visit via Telephone Note ? ?I connected with [name] on 04/10/21 at 11:15 AM EDT by telephone and verified that I am speaking with the correct person using two identifiers. She is out walking and does not have a smart phone.  ? ?Location: ?Patient: walking outside ?Provider: office ?  ?I discussed the limitations, risks, security and privacy concerns of performing an evaluation and management service by telephone and the availability of in person appointments. I also discussed with the patient that there may be a patient responsible charge related to this service. The patient expressed understanding and agreed to proceed. ? ? ?History of Present Illness: ?"I'm hanging in there". Due to muscle spasms [name] has not been sleeping well. It takes her hours to fall asleep most nights. Falling asleep has been a problem since she was a child. [name] has been trying to relax as much as she can.  She has been having seizures. [name] is no longer allowed to drive due to the seizures. She had a really bad one at the bank at the end of Feb. She has not had any more since then. She is working with her neurologist regarding the seizures. [name] is making an active effort to stay away from toxic people. She does have some depression on random days but it is manageable. She denies SI/HI.  ?  ?Observations/Objective: ? ?General Appearance: unable to assess  ?Eye Contact:  unable to assess  ?Speech:  Clear and Coherent and Normal Rate  ?Volume:  Normal  ?Mood:  Euthymic  ?Affect:  Full Range  ?Thought Process:  Coherent and Descriptions of Associations: Circumstantial, concrete  ?Orientation:  Full (Time, Place, and Person)  ?Thought Content:  Rumination  ?Suicidal Thoughts:  No  ?Homicidal Thoughts:  No  ?Memory:  Immediate;   Good  ?Judgement:  Good  ?Insight:  Good  ?Psychomotor Activity: unable to assess  ?Concentration:  Concentration: Fair  ?Recall:  Fair  ?Fund of Knowledge:  Fair  ?Language:  Good  ?Akathisia:   unable to assess  ?Handed:  unable to assess  ?AIMS (if indicated):     ?Assets:  Communication Skills ?Desire for Improvement ?Financial Resources/Insurance ?Housing ?Resilience ?Social Support ?Talents/Skills ?Transportation ?Vocational/Educational  ?ADL's:  unable to assess  ?Cognition:  WNL  ?Sleep:     ? ? ? ?Assessment and Plan: ? ?  04/10/2021  ? 11:32 AM 01/09/2021  ? 10:23 AM 10/17/2020  ?  9:56 AM 07/18/2020  ? 10:10 AM 04/25/2020  ? 10:51 AM  ?Depression screen PHQ 2/9  ?Decreased Interest 0 0 0 0 0  ?Down, Depressed, Hopeless 1 0 1 0 0  ?PHQ - 2 Score 1 0 1 0 0  ?Altered sleeping     0  ?Tired, decreased energy     0  ?Change in appetite     0  ?Feeling bad or failure about yourself      0  ?Trouble concentrating     0  ?Moving slowly or fidgety/restless     0  ?Suicidal thoughts     0  ?PHQ-9 Score     0  ?Difficult doing work/chores     Not difficult at all  ? ? ?Flowsheet Row Video Visit from 04/10/2021 in [facility] Video Visit from 01/09/2021 in [facility] Video Visit from 10/17/2020 in [facility]  ?C-SSRS RISK CATEGORY No Risk No Risk No Risk  ? ?  ? ?1. Major  depressive disorder, recurrent episode, moderate ([place]) ?- mirtazapine (REMERON) 30 MG tablet; Take 1 tablet (30 mg total) by mouth at bedtime.  Dispense: 90 tablet; Refill: 0 ? ?2. Primary insomnia ?- mirtazapine (REMERON) 30 MG tablet; Take 1 tablet (30 mg total) by mouth at bedtime.  Dispense: 90 tablet; Refill: 0 ? ? ? ?Follow Up Instructions: ?In 2-3 months or sooner if needed ?  ?I discussed the assessment and treatment plan with the patient. The patient was provided an opportunity to ask questions and all were answered. The patient agreed with the plan and demonstrated an understanding of the instructions. ?  ?The patient was advised to call back or seek an in-person evaluation if the symptoms worsen or if the condition  fails to improve as anticipated. ? ?I provided 19 minutes of non-face-to-face time during this encounter. ? ? ?Charlcie Cradle, MD ? ?

## 2021-04-14 ENCOUNTER — Ambulatory Visit (INDEPENDENT_AMBULATORY_CARE_PROVIDER_SITE_OTHER): Payer: Medicaid Other | Admitting: Licensed Clinical Social Worker

## 2021-04-14 ENCOUNTER — Other Ambulatory Visit: Payer: Self-pay

## 2021-04-14 ENCOUNTER — Encounter (HOSPITAL_COMMUNITY): Payer: Self-pay | Admitting: Licensed Clinical Social Worker

## 2021-04-14 DIAGNOSIS — F331 Major depressive disorder, recurrent, moderate: Secondary | ICD-10-CM

## 2021-04-14 NOTE — Progress Notes (Signed)
Virtual Visit via Telephone Note ? ?I connected with [name] on 04/14/2021 at 10:00 am by phone enabled telehealth application and verified that I am speaking withthe correct person using two identifiers. Patient does not have a computer or Internet. ?  ?I discussed the limitations of evaluation and management by telemedicine and the availability of in person appointments. The patient expressed understanding and agreed to proceed. ? ?LOCATION:  ?Patient: Home ?Provider: Home Office ? ?Treatment Goal Addressed: Develop the ability to recognize, accept, and cope with feelings of depression AEB self report. ? ?Progress Towards Goal: Progressing ? ? ?History of Present Illness: Pt was referred for OP therapy by her neurologist Dr. [place] Lions for depression. ?  ?Observations/Objective: Patient presented for today?s session on time and was alert, oriented x5, with no evidence or self-report of SI/HI or A/V H.  Patient reported ongoing compliance with medication and denied any use of alcohol or illicit substances. Clinician inquired about patient?s current emotional ratings, as well as any significant changes in thoughts, feelings or behavior since previous session.  Patient reported emotional ratings of 3/10 for depression, 4/10 for anxiety, 3/10 for anger/irritability.  Cln and pt reviewed emotional ratings and coping skills. Pt reports no seizures experienced since last session., but I had one 30 days ago at the bank. Cln and pt reviewed her triggers for the seizure. Pt provided an update on family, daughter/grandchildren. Pt reports, "I spoke with my oldest granddaughter and her grandfather. I did not talk to my daughter. I'm trying to keep my stress low." Clinician utilized CBT to process thoughts and feelings. Cln encouraged pt continue her exercise regimen (walking) which  she continues. ? ? ? ?Collaboration of Care: Other: No collaboration of care during this counseling session. ? ?Patient/Guardian was advised  Release of Information must be obtained prior to any record release in order to collaborate their care with an outside provider. Patient/Guardian was advised if they have not already done so to contact the registration department to sign all necessary forms in order for [place] to release information regarding their care.  ? ?Consent: Patient/Guardian gives verbal consent for treatment and assignment of benefits for services provided during this visit. Patient/Guardian expressed understanding and agreed to proceed.   ? ? ? ?Assessment and plan: Counselor will continue to meet with patient to address treatment plan goals. Patient will continue to follow recommendations of providers and implement skills learned in session. ? ?Follow Up Instructions:   ?I discussed the assessment and treatment plan with the patient. The patient was provided an opportunity to ask questions and all were answered. The patient agreed with the plan and demonstrated an understanding of the instructions. ?  ?The patient was advised to call back or seek an in-person evaluation if the symptoms worsen or if the condition fails to improve as anticipated. ? ?I provided 45 minutes of non-face-to-face time during this encounter. ? ? ?Hermila Millis S, LCAS ? ? ?

## 2021-04-21 ENCOUNTER — Ambulatory Visit (INDEPENDENT_AMBULATORY_CARE_PROVIDER_SITE_OTHER): Payer: Medicaid Other | Admitting: Licensed Clinical Social Worker

## 2021-04-21 ENCOUNTER — Encounter (HOSPITAL_COMMUNITY): Payer: Self-pay | Admitting: Licensed Clinical Social Worker

## 2021-04-21 DIAGNOSIS — F331 Major depressive disorder, recurrent, moderate: Secondary | ICD-10-CM

## 2021-04-21 NOTE — Progress Notes (Signed)
Virtual Visit via Telephone Note ? ?I connected with [name] on 04/21/2021 at 10:00 am by phone enabled telehealth application and verified that I am speaking withthe correct person using two identifiers. Patient does not have a computer or Internet. ?  ?I discussed the limitations of evaluation and management by telemedicine and the availability of in person appointments. The patient expressed understanding and agreed to proceed. ? ?LOCATION:  ?Patient: Home ?Provider: Home Office ? ?Treatment Goal Addressed: Develop the ability to recognize, accept, and cope with feelings of depression AEB self report. ? ?Progress Towards Goal: Progressing ? ? ?History of Present Illness: Pt was referred for OP therapy by her neurologist Dr. [place] Lions for depression. ?  ?Observations/Objective: Patient presented for today?s session on time and was alert, oriented x5, with no evidence or self-report of SI/HI or A/V H.  Patient reported ongoing compliance with medication and denied any use of alcohol or illicit substances. Clinician inquired about patient?s current emotional ratings, as well as any significant changes in thoughts, feelings or behavior since previous session.  Patient reported emotional ratings of 3/10 for depression, 4/10 for anxiety, 2/10 for anger/irritability.  Cln and pt reviewed emotional ratings and coping skills. Pt reports no seizures experienced since last session. Pt provided an update on family, daughter/grandchildren. Pt reports, "I spoke with my sister who had spoken with my daughter. I did not talk to my daughter. I'm trying to keep my stress low."  Clinician utilized CBT to address her thought processes support and confidence in her decisions. Cln encouraged pt continue her exercise regimen (walking) which  she continues. ? ? ? ?Collaboration of Care: Other: No collaboration of care during this counseling session. ? ?Patient/Guardian was advised Release of Information must be obtained prior to any  record release in order to collaborate their care with an outside provider. Patient/Guardian was advised if they have not already done so to contact the registration department to sign all necessary forms in order for [place] to release information regarding their care.  ? ?Consent: Patient/Guardian gives verbal consent for treatment and assignment of benefits for services provided during this visit. Patient/Guardian expressed understanding and agreed to proceed.   ? ? ? ?Assessment and plan: Counselor will continue to meet with patient to address treatment plan goals. Patient will continue to follow recommendations of providers and implement skills learned in session. ? ?Follow Up Instructions:   ?I discussed the assessment and treatment plan with the patient. The patient was provided an opportunity to ask questions and all were answered. The patient agreed with the plan and demonstrated an understanding of the instructions. ?  ?The patient was advised to call back or seek an in-person evaluation if the symptoms worsen or if the condition fails to improve as anticipated. ? ?I provided 45 minutes of non-face-to-face time during this encounter. ? ? ?Madelina Sanda S, LCAS ? ? ?

## 2021-04-28 ENCOUNTER — Ambulatory Visit (INDEPENDENT_AMBULATORY_CARE_PROVIDER_SITE_OTHER): Payer: Medicaid Other | Admitting: Licensed Clinical Social Worker

## 2021-04-28 ENCOUNTER — Encounter (HOSPITAL_COMMUNITY): Payer: Self-pay | Admitting: Licensed Clinical Social Worker

## 2021-04-28 DIAGNOSIS — F331 Major depressive disorder, recurrent, moderate: Secondary | ICD-10-CM

## 2021-04-28 NOTE — Progress Notes (Signed)
Virtual Visit via Telephone Note ? ?I connected with [name] on 04/21/2021 at 10:15 am by phone enabled telehealth application and verified that I am speaking withthe correct person using two identifiers. Patient does not have a computer or Internet. ?  ?I discussed the limitations of evaluation and management by telemedicine and the availability of in person appointments. The patient expressed understanding and agreed to proceed. ? ?LOCATION:  ?Patient: Home ?Provider: Home Office ? ?Treatment Goal Addressed: Develop the ability to recognize, accept, and cope with feelings of depression AEB self report. ? ?Progress Towards Goal: Progressing ? ? ?History of Present Illness: Pt was referred for OP therapy by her neurologist Dr. Charlotte Crumb for depression. ?  ?Observations/Objective: Patient presented for today?s session on time and was alert, oriented x5, with no evidence or self-report of SI/HI or A/V H.  Patient reported ongoing compliance with medication and denied any use of alcohol or illicit substances. Clinician inquired about patient?s current emotional ratings, as well as any significant changes in thoughts, feelings or behavior since previous session.  Patient reported emotional ratings of 3/10 for depression, 4/10 for anxiety, 1/10 for anger/irritability.  Cln and pt reviewed emotional ratings and coping skills. Pt reports no seizures experienced since last session. Pt provided an update on family, daughter/grandchildren, health concerns, seizures. Pt reports, "I spoke with my sister and father of my daughter, who are neither stressful, over the Easter holiday. I didn't talk to my daughter or grandchildren, therefore my stress and anxiety was more stable. "I've been practicing my coping skills: breathing and visualization daily. Cln encouraged pt continue her daily exercise regimen (walking), which she continues. Clinician utilized MI OARS to reflect and summarize thoughts and  feelings. ? ? ? ?Collaboration of Care: Other: No collaboration of care during this counseling session. ? ?Patient/Guardian was advised Release of Information must be obtained prior to any record release in order to collaborate their care with an outside provider. Patient/Guardian was advised if they have not already done so to contact the registration department to sign all necessary forms in order for [place] to release information regarding their care.  ? ?Consent: Patient/Guardian gives verbal consent for treatment and assignment of benefits for services provided during this visit. Patient/Guardian expressed understanding and agreed to proceed.   ? ? ? ?Assessment and plan: Counselor will continue to meet with patient to address treatment plan goals. Patient will continue to follow recommendations of providers and implement skills learned in session. ? ?Follow Up Instructions:   ?I discussed the assessment and treatment plan with the patient. The patient was provided an opportunity to ask questions and all were answered. The patient agreed with the plan and demonstrated an understanding of the instructions. ?  ?The patient was advised to call back or seek an in-person evaluation if the symptoms worsen or if the condition fails to improve as anticipated. ? ?I provided 45 minutes of non-face-to-face time during this encounter. ? ? ?Hank Walling S, LCAS ? ? ?

## 2021-04-29 ENCOUNTER — Encounter: Payer: Self-pay | Admitting: Internal Medicine

## 2021-04-29 ENCOUNTER — Ambulatory Visit (HOSPITAL_BASED_OUTPATIENT_CLINIC_OR_DEPARTMENT_OTHER): Payer: Medicaid Other | Admitting: Internal Medicine

## 2021-04-29 VITALS — BP 158/91 | HR 63 | Resp 16 | Wt 226.8 lb

## 2021-04-29 DIAGNOSIS — Z5321 Procedure and treatment not carried out due to patient leaving prior to being seen by health care provider: Secondary | ICD-10-CM

## 2021-04-29 NOTE — Progress Notes (Signed)
Patient left before being seen.  She rescheduled. ?

## 2021-05-05 ENCOUNTER — Encounter (HOSPITAL_COMMUNITY): Payer: Self-pay | Admitting: Licensed Clinical Social Worker

## 2021-05-05 ENCOUNTER — Ambulatory Visit (INDEPENDENT_AMBULATORY_CARE_PROVIDER_SITE_OTHER): Payer: Medicaid Other | Admitting: Licensed Clinical Social Worker

## 2021-05-05 DIAGNOSIS — F331 Major depressive disorder, recurrent, moderate: Secondary | ICD-10-CM

## 2021-05-05 NOTE — Progress Notes (Signed)
Virtual Visit via Telephone Note ? ?I connected with [name] on 05/05/2021 at 10:00 am by phone enabled telehealth application and verified that I am speaking withthe correct person using two identifiers. Patient does not have a computer or Internet. ?  ?I discussed the limitations of evaluation and management by telemedicine and the availability of in person appointments. The patient expressed understanding and agreed to proceed. ? ?LOCATION:  ?Patient: Home ?Provider: Home [facility] ? ?Treatment Goal Addressed: Develop the ability to recognize, accept, and cope with feelings of depression AEB self report. ? ?Progress Towards Goal: Progressing ? ? ?History of Present Illness: Pt was referred for OP therapy by her neurologist Dr. Charlotte Crumb for depression. ?  ?Observations/Objective: Patient presented for today?[name] session on time and was alert, oriented x5, with no evidence or self-report of SI/HI or A/V H.  Patient reported ongoing compliance with medication and denied any use of alcohol or illicit substances. Clinician inquired about patient?[name] current emotional ratings, as well as any significant changes in thoughts, feelings or behavior since previous session.  Patient reported emotional ratings of 3/10 for depression, 4/10 for anxiety, 1/10 for anger/irritability.  Cln and pt reviewed emotional ratings and coping skills. Pt reports no seizures experienced since last session. Pt provided an update on family, daughter/grandchildren, health concerns, seizures. Pt reports, "I didn't talk to any of my family over the last week, therefore my stress and anxiety was more stable." Cln asked open ended questions. "I've been practicing my coping skills: breathing and visualization daily." Pt practices "soothing with 5 senses" during session. Cln encouraged pt continue her daily exercise regimen (walking), which she continues.  ? ? ?Collaboration of Care: Other: No collaboration of care during this counseling  session. ? ?Patient/Guardian was advised Release of Information must be obtained prior to any record release in order to collaborate their care with an outside provider. Patient/Guardian was advised if they have not already done so to contact the registration department to sign all necessary forms in order for [place] to release information regarding their care.  ? ?Consent: Patient/Guardian gives verbal consent for treatment and assignment of benefits for services provided during this visit. Patient/Guardian expressed understanding and agreed to proceed.   ? ? ? ?Assessment and plan: Counselor will continue to meet with patient to address treatment plan goals. Patient will continue to follow recommendations of providers and implement skills learned in session. ? ?Follow Up Instructions:   ?I discussed the assessment and treatment plan with the patient. The patient was provided an opportunity to ask questions and all were answered. The patient agreed with the plan and demonstrated an understanding of the instructions. ?  ?The patient was advised to call back or seek an in-person evaluation if the symptoms worsen or if the condition fails to improve as anticipated. ? ?I provided 30 minutes of non-face-to-face time during this encounter. ? ? ?[name], LCAS ? ? ?

## 2021-05-06 ENCOUNTER — Ambulatory Visit: Payer: Medicaid Other | Admitting: Podiatry

## 2021-05-06 DIAGNOSIS — M79674 Pain in right toe(s): Secondary | ICD-10-CM | POA: Diagnosis not present

## 2021-05-06 DIAGNOSIS — M79675 Pain in left toe(s): Secondary | ICD-10-CM

## 2021-05-06 DIAGNOSIS — E119 Type 2 diabetes mellitus without complications: Secondary | ICD-10-CM | POA: Diagnosis not present

## 2021-05-06 DIAGNOSIS — B351 Tinea unguium: Secondary | ICD-10-CM | POA: Diagnosis not present

## 2021-05-11 NOTE — Progress Notes (Signed)
Subjective: ?66 year old female presents the office with concerns of thick, discolored toenails that she cannot trim herself and they are causing discomfort as well as for calluses.  She has no new concerns today.   ? ?Ladell Pier, MD ? ?Objective: ?AAO x3, NAD ?DP/PT pulses palpable bilaterally, CRT less than 3 seconds ?Nails are hypertrophic, dystrophic, brittle, discolored, elongated ?10. No surrounding redness or drainage. Tenderness nails 1-5 bilaterally. ?Hyperkeratotic lesion present right submetatarsal 1 as well as bilateral medial rear foot and medial hallux bilaterally.  There is no underlying ulceration drainage or any signs of infection. ?There is a annular hyper pigmented lesion present on the right third toe consistent with a mole.  Unchanged. ?No open lesions or pre-ulcerative lesions are identified today. ?No pain with calf compression, swelling, warmth, erythema ? ?Assessment: ?Type 2 diabetes with neuropathy, symptomatic onychomycosis/hyperkeratotic lesions ? ?Plan: ?-All treatment options discussed with the patient including all alternatives, risks, complications.  ?-Sharply debrided nails x10 without any complications or bleeding ?-Hyperkeratotic lesion sharply debrided x5 without any complications or bleeding as a courtesy as it was minimal.  ?-Hyperpigmented lesion on the right third toe which has been ongoing for many years without change.  Continue to monitor. ?-Discussed daily foot inspection ?-Patient encouraged to call the office with any questions, concerns, change in symptoms.  ? ?Trula Slade DPM ? ? ?

## 2021-05-12 ENCOUNTER — Ambulatory Visit (INDEPENDENT_AMBULATORY_CARE_PROVIDER_SITE_OTHER): Payer: Medicaid Other | Admitting: Licensed Clinical Social Worker

## 2021-05-12 ENCOUNTER — Encounter (HOSPITAL_COMMUNITY): Payer: Self-pay | Admitting: Licensed Clinical Social Worker

## 2021-05-12 DIAGNOSIS — F331 Major depressive disorder, recurrent, moderate: Secondary | ICD-10-CM

## 2021-05-12 NOTE — Progress Notes (Signed)
Virtual Visit via Telephone Note ? ?I connected with [name] on 05/12/2021 at 10:00 am by phone enabled telehealth application and verified that I am speaking withthe correct person using two identifiers. Patient does not have a computer or Internet. ?  ?I discussed the limitations of evaluation and management by telemedicine and the availability of in person appointments. The patient expressed understanding and agreed to proceed. ? ?LOCATION:  ?Patient: Home ?Provider: Home [facility] ? ?Treatment Goal Addressed: Develop the ability to recognize, accept, and cope with feelings of depression AEB self report. ? ?Progress Towards Goal: Progressing ? ? ?History of Present Illness: Pt was referred for OP therapy by her neurologist Dr. Charlotte Crumb for depression. ?  ?Observations/Objective: Patient presented for today?s session on time and was alert, oriented x5, with no evidence or self-report of SI/HI or A/V H.  Patient reported ongoing compliance with medication and denied any use of alcohol or illicit substances. Clinician inquired about patient?s current emotional ratings, as well as any significant changes in thoughts, feelings or behavior since previous session.  Patient reported emotional ratings of 3/10 for depression, 3/10 for anxiety, 1/10 for anger/irritability.  Cln and pt reviewed emotional ratings and coping skills. Pt reports no seizures experienced since last session. Pt provided an update on family, daughter/grandchildren, health concerns, seizures. Pt reports, "I talked to my family over the weekend, and my stress and anxiety remained stable. I was able to use my coping skills during the call." Cln asked open ended questions. Cln and pt explored a new coping skill: 7 breathing technique during session. Cln encouraged pt continue her daily exercise regimen (walking), which she continues.  ? ? ?Collaboration of Care: Other: No collaboration of care during this counseling session. ? ?Patient/Guardian was  advised Release of Information must be obtained prior to any record release in order to collaborate their care with an outside provider. Patient/Guardian was advised if they have not already done so to contact the registration department to sign all necessary forms in order for [place] to release information regarding their care.  ? ?Consent: Patient/Guardian gives verbal consent for treatment and assignment of benefits for services provided during this visit. Patient/Guardian expressed understanding and agreed to proceed.   ? ? ? ?Assessment and plan: Counselor will continue to meet with patient to address treatment plan goals. Patient will continue to follow recommendations of providers and implement skills learned in session. ? ?Follow Up Instructions:   ?I discussed the assessment and treatment plan with the patient. The patient was provided an opportunity to ask questions and all were answered. The patient agreed with the plan and demonstrated an understanding of the instructions. ?  ?The patient was advised to call back or seek an in-person evaluation if the symptoms worsen or if the condition fails to improve as anticipated. ? ?I provided 45 minutes of non-face-to-face time during this encounter. ? ? ?Cing Gallup S, LCAS ? ? ?

## 2021-05-19 ENCOUNTER — Ambulatory Visit (INDEPENDENT_AMBULATORY_CARE_PROVIDER_SITE_OTHER): Payer: Medicaid Other | Admitting: Licensed Clinical Social Worker

## 2021-05-19 ENCOUNTER — Encounter (HOSPITAL_COMMUNITY): Payer: Self-pay | Admitting: Licensed Clinical Social Worker

## 2021-05-19 DIAGNOSIS — F331 Major depressive disorder, recurrent, moderate: Secondary | ICD-10-CM

## 2021-05-19 NOTE — Progress Notes (Signed)
Virtual Visit via Telephone Note ? ?I connected with [name] on 05/19/2021 at 10:00 am by phone enabled telehealth application and verified that I am speaking withthe correct person using two identifiers. Patient does not have a computer or Internet. ?  ?I discussed the limitations of evaluation and management by telemedicine and the availability of in person appointments. The patient expressed understanding and agreed to proceed. ? ?LOCATION:  ?Patient: Home ?Provider: Home Office ? ?Treatment Goal Addressed: Develop the ability to recognize, accept, and cope with feelings of depression AEB self report. ? ?Progress Towards Goal: Progressing ? ? ?History of Present Illness: Pt was referred for OP therapy by her neurologist Dr. Sonoita Lions for depression. ?  ?Observations/Objective: Patient presented for today?s session on time and was alert, oriented x5, with no evidence or self-report of SI/HI or A/V H.  Patient reported ongoing compliance with medication and denied any use of alcohol or illicit substances. Clinician inquired about patient?s current emotional ratings, as well as any significant changes in thoughts, feelings or behavior since previous session, nor panic attacks.  Patient reported emotional ratings of 4/10 for depression, 5/10 for anxiety, 1/10 for anger/irritability.  Cln and pt reviewed emotional ratings and coping skills. Pt reports no seizures experienced since last session. Pt provided an update on family, daughter/grandchildren, health concerns, seizures. Pt reports, "I have to get a debit card to pay my rent through [employer]. This is overwhelming to me and made me feel more depressed." Cln asked open-ended questions. Cln provided education on implementing a debit card. Cln suggested pt talk to management in her building to assist her. Cln and pt also discussed the necessity of getting a SMART phone so that we can continue with therapy. Cln provided education on SMART phone capability. Cln  suggested pt talk to her sister for assistance as well. Cln encouraged pt continue her daily exercise regimen (walking), which she continues.  ? ? ?Collaboration of Care: Other: Asking for help with debit card and SMART phone. ? ?Patient/Guardian was advised Release of Information must be obtained prior to any record release in order to collaborate their care with an outside provider. Patient/Guardian was advised if they have not already done so to contact the registration department to sign all necessary forms in order for [place] to release information regarding their care.  ? ?Consent: Patient/Guardian gives verbal consent for treatment and assignment of benefits for services provided during this visit. Patient/Guardian expressed understanding and agreed to proceed.   ? ? ? ?Assessment and plan: Counselor will continue to meet with patient to address treatment plan goals. Patient will continue to follow recommendations of providers and implement skills learned in session. ? ?Follow Up Instructions:   ?I discussed the assessment and treatment plan with the patient. The patient was provided an opportunity to ask questions and all were answered. The patient agreed with the plan and demonstrated an understanding of the instructions. ?  ?The patient was advised to call back or seek an in-person evaluation if the symptoms worsen or if the condition fails to improve as anticipated. ? ?I provided 45 minutes of non-face-to-face time during this encounter. ? ? ?Sontee Desena S, LCAS ? ? ?

## 2021-05-26 ENCOUNTER — Ambulatory Visit (INDEPENDENT_AMBULATORY_CARE_PROVIDER_SITE_OTHER): Payer: Medicaid Other | Admitting: Licensed Clinical Social Worker

## 2021-05-26 ENCOUNTER — Encounter (HOSPITAL_COMMUNITY): Payer: Self-pay | Admitting: Licensed Clinical Social Worker

## 2021-05-26 DIAGNOSIS — F331 Major depressive disorder, recurrent, moderate: Secondary | ICD-10-CM

## 2021-05-26 NOTE — Progress Notes (Signed)
Virtual Visit via Telephone Note ? ?I connected with [name] on 05/26/2021 at 10:00 am by phone enabled telehealth application and verified that I am speaking withthe correct person using two identifiers. Patient does not have a computer or Internet. ?  ?I discussed the limitations of evaluation and management by telemedicine and the availability of in person appointments. The patient expressed understanding and agreed to proceed. ? ?LOCATION:  ?Patient: Home ?Provider: Home Office ? ?Treatment Goal Addressed: Develop the ability to recognize, accept, and cope with feelings of depression AEB self report. ? ?Progress Towards Goal: Progressing ? ? ?History of Present Illness: Pt was referred for OP therapy by her neurologist Dr. Charlotte Crumb for depression. ?  ?Observations/Objective: Patient presented for today?s session on time and was alert, oriented x5, with no evidence or self-report of SI/HI or A/V H.  Patient reported ongoing compliance with medication and denied any use of alcohol or illicit substances. Clinician inquired about patient?s current emotional ratings, as well as any significant changes in thoughts, feelings or behavior since previous session, nor panic attacks.  Patient reported emotional ratings of 4/10 for depression, 5/10 for anxiety, 1/10 for anger/irritability.  Cln and pt reviewed emotional ratings and coping skills. Pt reports no seizures experienced since last session. Pt provided an update on family, daughter/grandchildren, health concerns, seizures. Pt reports, "I went to [facility] and talked to them about getting a SMART phone so we can continue therapy, but it's too expensive. I can't afford it due to my limited income." Cln asked open-ended questions. Pt reports, "I have not talked to my daughter or grandchildren in 2 weeks. I miss them but I can't get involved in their toxicity." Clinician utilized MI OARS to reflect and summarize thoughts and feelings. Cln encouraged pt continue her  daily exercise regimen (walking), which she continues.  ? ? ?Collaboration of Care: Other: Asking for help with debit card and SMART phone. ? ?Patient/Guardian was advised Release of Information must be obtained prior to any record release in order to collaborate their care with an outside provider. Patient/Guardian was advised if they have not already done so to contact the registration department to sign all necessary forms in order for [place] to release information regarding their care.  ? ?Consent: Patient/Guardian gives verbal consent for treatment and assignment of benefits for services provided during this visit. Patient/Guardian expressed understanding and agreed to proceed.   ? ? ? ?Assessment and plan: Counselor will continue to meet with patient to address treatment plan goals. Patient will continue to follow recommendations of providers and implement skills learned in session. ? ?Follow Up Instructions:   ?I discussed the assessment and treatment plan with the patient. The patient was provided an opportunity to ask questions and all were answered. The patient agreed with the plan and demonstrated an understanding of the instructions. ?  ?The patient was advised to call back or seek an in-person evaluation if the symptoms worsen or if the condition fails to improve as anticipated. ? ?I provided 45 minutes of non-face-to-face time during this encounter. ? ? ?Christi Wirick S, LCAS ? ? ?

## 2021-06-02 ENCOUNTER — Encounter (HOSPITAL_COMMUNITY): Payer: Self-pay | Admitting: Licensed Clinical Social Worker

## 2021-06-02 ENCOUNTER — Ambulatory Visit (INDEPENDENT_AMBULATORY_CARE_PROVIDER_SITE_OTHER): Payer: Medicaid Other | Admitting: Licensed Clinical Social Worker

## 2021-06-02 DIAGNOSIS — F331 Major depressive disorder, recurrent, moderate: Secondary | ICD-10-CM | POA: Diagnosis not present

## 2021-06-02 NOTE — Progress Notes (Signed)
Virtual Visit via Telephone Note ? ?I connected with [name] on 06/02/2021 at 10:00 am by phone enabled telehealth application and verified that I am speaking withthe correct person using two identifiers. Patient does not have a computer or Internet. ?  ?I discussed the limitations of evaluation and management by telemedicine and the availability of in person appointments. The patient expressed understanding and agreed to proceed. ? ?LOCATION:  ?Patient: Home ?Provider: Home Office ? ?Treatment Goal Addressed: Develop the ability to recognize, accept, and cope with feelings of depression AEB self report. ? ?Progress Towards Goal: Progressing ? ? ?History of Present Illness: Pt was referred for OP therapy by her neurologist Dr. [place] Lions for depression. ?  ?Observations/Objective: Patient presented for today?s session on time and was alert, oriented x5, with no evidence or self-report of SI/HI or A/V H.  Patient reported ongoing compliance with medication and denied any use of alcohol or illicit substances. Clinician inquired about patient?s current emotional ratings, as well as any significant changes in thoughts, feelings or behavior since previous session, nor panic attacks.  Patient reported emotional ratings of 4/10 for depression, 5/10 for anxiety, 1/10 for anger/irritability.  Cln and pt reviewed emotional ratings and coping skills. Pt reports no seizures experienced since last session. Pt provided an update on family, daughter/grandchildren, health concerns, seizures. Pt reports, "I had an intervention this weekend. My neighbor asked about why I wear a coat and head scarf all the time. I hide behind my coat because of my weight. I wear my scarf to hide my short hair." Clinician utilized CBT to process thoughts, feelings, and behaviors. "I went for my walk this morning and felt so free!" Pt reports on her Mother's Day. "I talked to my daughter, her father, my sister, and my 3 grandchildren. It was a good  conversation and no drama."  Cln asked open-ended questions. Cln encouraged pt continue her daily exercise regimen (walking), which she continues.  ? ? ?Collaboration of Care: Other: None needed at this session. ? ?Patient/Guardian was advised Release of Information must be obtained prior to any record release in order to collaborate their care with an outside provider. Patient/Guardian was advised if they have not already done so to contact the registration department to sign all necessary forms in order for [place] to release information regarding their care.  ? ?Consent: Patient/Guardian gives verbal consent for treatment and assignment of benefits for services provided during this visit. Patient/Guardian expressed understanding and agreed to proceed.   ? ? ? ?Assessment and plan: Counselor will continue to meet with patient to address treatment plan goals. Patient will continue to follow recommendations of providers and implement skills learned in session. ? ?Follow Up Instructions:   ?I discussed the assessment and treatment plan with the patient. The patient was provided an opportunity to ask questions and all were answered. The patient agreed with the plan and demonstrated an understanding of the instructions. ?  ?The patient was advised to call back or seek an in-person evaluation if the symptoms worsen or if the condition fails to improve as anticipated. ? ?I provided 45 minutes of non-face-to-face time during this encounter. ? ? ?Niralya Ohanian S, LCAS ? ? ?

## 2021-06-09 ENCOUNTER — Encounter (HOSPITAL_COMMUNITY): Payer: Self-pay | Admitting: Licensed Clinical Social Worker

## 2021-06-09 ENCOUNTER — Ambulatory Visit (INDEPENDENT_AMBULATORY_CARE_PROVIDER_SITE_OTHER): Payer: Medicaid Other | Admitting: Licensed Clinical Social Worker

## 2021-06-09 DIAGNOSIS — F331 Major depressive disorder, recurrent, moderate: Secondary | ICD-10-CM

## 2021-06-09 NOTE — Progress Notes (Signed)
Virtual Visit via Telephone Note  I connected with [name] on 06/09/2021 at 10:00 am by phone enabled telehealth application and verified that I am speaking withthe correct person using two identifiers. Patient does not have a computer or Internet.   I discussed the limitations of evaluation and management by telemedicine and the availability of in person appointments. The patient expressed understanding and agreed to proceed.  LOCATION:  Patient: Home Provider: Home Office  Treatment Goal Addressed: Develop the ability to recognize, accept, and cope with feelings of depression AEB self report.  Progress Towards Goal: Progressing   History of Present Illness: Pt was referred for OP therapy by her neurologist Dr. Hodgeman Lions for depression.   Observations/Objective: Patient presented for today's session on time and was alert, oriented x5, with no evidence or self-report of SI/HI or A/V H.  Patient reported ongoing compliance with medication and denied any use of alcohol or illicit substances. Clinician inquired about patient's current emotional ratings, as well as any significant changes in thoughts, feelings or behavior since previous session, nor panic attacks.  Patient reported emotional ratings of 4/10 for depression, 5/10 for anxiety, 2/10 for anger/irritability.  Cln and pt reviewed emotional ratings and coping skills. Pt reports no seizures experienced since last session. Pt provided an update on family, daughter/grandchildren, health concerns, seizures. Pt reports, "I talked to my sister and got some disturbing information about my daughter which caused me an increase in my anxiety and stress. This is why I don't want drama in my life. I have to protect myself." Cln asked open-ended questions. Cln reminded patient the importance of using boundaries, providing her education on boundaries. Cln encouraged pt continue her daily exercise regimen (walking), which she continues.     Collaboration of Care: Other: None needed at this session.  Patient/Guardian was advised Release of Information must be obtained prior to any record release in order to collaborate their care with an outside provider. Patient/Guardian was advised if they have not already done so to contact the registration department to sign all necessary forms in order for [place] to release information regarding their care.   Consent: Patient/Guardian gives verbal consent for treatment and assignment of benefits for services provided during this visit. Patient/Guardian expressed understanding and agreed to proceed.      Assessment and plan: Counselor will continue to meet with patient to address treatment plan goals. Patient will continue to follow recommendations of providers and implement skills learned in session.  Follow Up Instructions:   I discussed the assessment and treatment plan with the patient. The patient was provided an opportunity to ask questions and all were answered. The patient agreed with the plan and demonstrated an understanding of the instructions.   The patient was advised to call back or seek an in-person evaluation if the symptoms worsen or if the condition fails to improve as anticipated.  I provided 45 minutes of non-face-to-face time during this encounter.   Ameen Mostafa S, LCAS

## 2021-06-09 NOTE — Progress Notes (Signed)
   Established Patient Office Visit  Subjective   Patient ID: [name], female    DOB: [birth year]  Age: 66 y.o. MRN: [id number]  No chief complaint on file.   HTN DM Sz Pcp johnson   {History (Optional):23778}  ROS    Objective:     There were no vitals taken for this visit. {Vitals History (Optional):23777}  Physical Exam   No results found for any visits on 06/10/21.  {Labs (Optional):23779}  The 10-year ASCVD risk score (Arnett DK, et al., 2019) is: 23.9%    Assessment & Plan:   Problem List Items Addressed This Visit   None   No follow-ups on file.    Shan Levans, MD

## 2021-06-10 ENCOUNTER — Ambulatory Visit: Payer: Medicaid Other | Attending: Critical Care Medicine | Admitting: Critical Care Medicine

## 2021-06-10 ENCOUNTER — Encounter: Payer: Self-pay | Admitting: Critical Care Medicine

## 2021-06-10 ENCOUNTER — Other Ambulatory Visit: Payer: Self-pay

## 2021-06-10 ENCOUNTER — Telehealth: Payer: Self-pay

## 2021-06-10 VITALS — BP 209/112 | HR 74 | Wt 213.6 lb

## 2021-06-10 DIAGNOSIS — L700 Acne vulgaris: Secondary | ICD-10-CM | POA: Diagnosis not present

## 2021-06-10 DIAGNOSIS — E669 Obesity, unspecified: Secondary | ICD-10-CM

## 2021-06-10 DIAGNOSIS — G40909 Epilepsy, unspecified, not intractable, without status epilepticus: Secondary | ICD-10-CM

## 2021-06-10 DIAGNOSIS — F4323 Adjustment disorder with mixed anxiety and depressed mood: Secondary | ICD-10-CM

## 2021-06-10 DIAGNOSIS — R7303 Prediabetes: Secondary | ICD-10-CM

## 2021-06-10 DIAGNOSIS — Z6838 Body mass index (BMI) 38.0-38.9, adult: Secondary | ICD-10-CM | POA: Diagnosis not present

## 2021-06-10 DIAGNOSIS — F331 Major depressive disorder, recurrent, moderate: Secondary | ICD-10-CM | POA: Diagnosis not present

## 2021-06-10 DIAGNOSIS — R569 Unspecified convulsions: Secondary | ICD-10-CM | POA: Diagnosis not present

## 2021-06-10 DIAGNOSIS — I10 Essential (primary) hypertension: Secondary | ICD-10-CM

## 2021-06-10 MED ORDER — BLOOD PRESSURE MONITOR DEVI: 0 refills | Status: DC

## 2021-06-10 MED ORDER — VITAMIN D (ERGOCALCIFEROL) 1.25 MG (50000 UNIT) PO CAPS
ORAL_CAPSULE | ORAL | 3 refills | Status: DC
  Filled 2021-06-10: qty 12, fill #0
  Filled 2021-06-11: qty 12, 84d supply, fill #0
  Filled 2021-09-17: qty 12, 84d supply, fill #1

## 2021-06-10 MED ORDER — LEVETIRACETAM ER 500 MG PO TB24
ORAL_TABLET | ORAL | 3 refills | Status: DC
  Filled 2021-06-10: qty 270, fill #0
  Filled 2021-06-20: qty 270, 90d supply, fill #0
  Filled 2021-09-17: qty 270, 90d supply, fill #1

## 2021-06-10 MED ORDER — HYDRALAZINE HCL 50 MG PO TABS
50.0000 mg | ORAL_TABLET | Freq: Three times a day (TID) | ORAL | 6 refills | Status: DC
  Filled 2021-06-10: qty 90, 30d supply, fill #0
  Filled 2021-08-04: qty 90, 30d supply, fill #1
  Filled 2021-09-17: qty 90, 30d supply, fill #2

## 2021-06-10 MED ORDER — LORATADINE 10 MG PO TABS
ORAL_TABLET | Freq: Every day | ORAL | 2 refills | Status: DC
  Filled 2021-06-10: qty 30, 30d supply, fill #0
  Filled 2021-09-17: qty 30, 30d supply, fill #1
  Filled 2021-10-20: qty 30, 30d supply, fill #2

## 2021-06-10 MED ORDER — CLINDAMYCIN PHOS-BENZOYL PEROX 1-5 % EX GEL
Freq: Two times a day (BID) | CUTANEOUS | 0 refills | Status: DC
  Filled 2021-06-10: qty 25, 10d supply, fill #0

## 2021-06-10 MED ORDER — AMLODIPINE BESYLATE 10 MG PO TABS
ORAL_TABLET | Freq: Every day | ORAL | 0 refills | Status: DC
  Filled 2021-06-10: qty 90, fill #0
  Filled 2021-06-27: qty 90, 90d supply, fill #0

## 2021-06-10 MED ORDER — TRETINOIN 0.05 % EX CREA
TOPICAL_CREAM | Freq: Every day | CUTANEOUS | 0 refills | Status: DC
  Filled 2021-06-10: qty 45, 30d supply, fill #0

## 2021-06-10 MED ORDER — METFORMIN HCL 500 MG PO TABS
ORAL_TABLET | Freq: Every day | ORAL | 2 refills | Status: DC
  Filled 2021-06-10: qty 90, 90d supply, fill #0
  Filled 2021-09-17: qty 90, 90d supply, fill #1

## 2021-06-10 MED ORDER — CARVEDILOL 12.5 MG PO TABS
12.5000 mg | ORAL_TABLET | Freq: Two times a day (BID) | ORAL | 3 refills | Status: DC
  Filled 2021-06-10: qty 60, 30d supply, fill #0
  Filled 2021-08-04: qty 60, 30d supply, fill #1
  Filled 2021-09-17: qty 60, 30d supply, fill #2
  Filled 2021-10-20: qty 60, 30d supply, fill #3

## 2021-06-10 MED ORDER — SPIRONOLACTONE 25 MG PO TABS
25.0000 mg | ORAL_TABLET | Freq: Every day | ORAL | 2 refills | Status: DC
  Filled 2021-06-10: qty 60, 60d supply, fill #0
  Filled 2021-08-04: qty 60, 60d supply, fill #1
  Filled 2021-09-17 – 2021-10-20 (×2): qty 60, 60d supply, fill #2

## 2021-06-10 NOTE — Patient Instructions (Signed)
Refills on all medications sent to our pharmacy  Stop hydrochlorothiazide and begin Aldactone 1 pill daily for blood pressure  Increase hydralazine to 3 times daily  Stay on amlodipine 1 daily  Start carvedilol 12.5 mg twice daily  Begin BenzaClin gel to the face twice daily do not put it in your eyes for acne  Labs today include metabolic panel  Return to see [facility] for your blood pressure in 3 weeks  Return to Dr. Joya Gaskins 2 months  Follow healthy diet as outlined in the lifestyle medicine handout issued today  Try to walk 20 minutes 3-4 times a week

## 2021-06-10 NOTE — Assessment & Plan Note (Signed)
No further seizures refill Keppra

## 2021-06-10 NOTE — Assessment & Plan Note (Signed)
Patient given lifestyle management handout encouraged exercise up to 30 minutes 5 times a week follow-up plant-based diet The following Lifestyle Medicine recommendations according to American College of Lifestyle Medicine [facility]) were discussed and offered to patient who agrees to start the journey:  A. Whole Foods, Plant-based plate comprising of fruits and vegetables, plant-based proteins, whole-grain carbohydrates was discussed in detail with the patient.   A list for source of those nutrients were also provided to the patient.  Patient will use only water or unsweetened tea for hydration. B.  The need to stay away from risky substances including alcohol, smoking; obtaining 7 to 9 hours of restorative sleep, at least 150 minutes of moderate intensity exercise weekly, the importance of healthy social connections,  and stress reduction techniques were discussed. C.  A full color page of  Calorie density of various food groups per pound showing examples of each food groups was provided to the patient.

## 2021-06-10 NOTE — Assessment & Plan Note (Signed)
A1c 6.0 continue metformin

## 2021-06-10 NOTE — Telephone Encounter (Signed)
Benzaclin and generic are non-preferred on patient's ins.  If appropriate can you change to Retin-A, Differin, Duac or Epiduo?

## 2021-06-10 NOTE — Assessment & Plan Note (Signed)
Improved under mental health care no changes

## 2021-06-10 NOTE — Assessment & Plan Note (Signed)
Improved under mental health care

## 2021-06-10 NOTE — Addendum Note (Signed)
Addended by: Asencion Noble E on: 06/10/2021 01:12 PM   Modules accepted: Orders

## 2021-06-10 NOTE — Assessment & Plan Note (Signed)
Acne vulgaris over the face will begin Retin-A daily

## 2021-06-10 NOTE — Assessment & Plan Note (Signed)
Currently off all medications and not well controlled  Plan is to discontinue hydrochlorothiazide and begin Aldactone 25 mg daily Begin carvedilol 12 and half milligram twice daily Increase hydralazine 50 mg 3 times daily Continue amlodipine 10 mg daily  We will check metabolic panel and get patient back with clinical pharmacy 3 weeks

## 2021-06-11 ENCOUNTER — Other Ambulatory Visit: Payer: Self-pay

## 2021-06-11 ENCOUNTER — Telehealth: Payer: Self-pay

## 2021-06-11 LAB — BMP8+EGFR
BUN/Creatinine Ratio: 6 — ABNORMAL LOW (ref 12–28)
BUN: 5 mg/dL — ABNORMAL LOW (ref 8–27)
CO2: 26 mmol/L (ref 20–29)
Calcium: 9.7 mg/dL (ref 8.7–10.3)
Chloride: 103 mmol/L (ref 96–106)
Creatinine, Ser: 0.87 mg/dL (ref 0.57–1.00)
Glucose: 96 mg/dL (ref 70–99)
Potassium: 4.9 mmol/L (ref 3.5–5.2)
Sodium: 141 mmol/L (ref 134–144)
eGFR: 75 mL/min/{1.73_m2} (ref >59)

## 2021-06-11 NOTE — Telephone Encounter (Signed)
Pt was called and is aware of results, DOB was confirmed.  ?

## 2021-06-11 NOTE — Telephone Encounter (Signed)
-----   Message from [name], MD sent at 06/11/2021  5:42 AM EDT ----- Let pt know kidney is normal

## 2021-06-17 ENCOUNTER — Telehealth: Payer: Self-pay | Admitting: Pharmacist

## 2021-06-17 NOTE — Telephone Encounter (Signed)
Patient attempted to be outreached by Layna Fox, PharmD Candidate on 06/13/21 to discuss hypertension.   Left voicemail for patient to return our call at her convenience.    Catie T. Maikel Neisler, PharmD, BCACP [facility] [phone]  

## 2021-06-18 ENCOUNTER — Encounter (HOSPITAL_COMMUNITY): Payer: Self-pay | Admitting: Licensed Clinical Social Worker

## 2021-06-18 ENCOUNTER — Other Ambulatory Visit: Payer: Self-pay | Admitting: Pharmacist

## 2021-06-18 ENCOUNTER — Ambulatory Visit (INDEPENDENT_AMBULATORY_CARE_PROVIDER_SITE_OTHER): Payer: Medicaid Other | Admitting: Licensed Clinical Social Worker

## 2021-06-18 DIAGNOSIS — I10 Essential (primary) hypertension: Secondary | ICD-10-CM

## 2021-06-18 DIAGNOSIS — F331 Major depressive disorder, recurrent, moderate: Secondary | ICD-10-CM | POA: Diagnosis not present

## 2021-06-18 NOTE — Progress Notes (Signed)
Virtual Visit via Telephone Note  I connected with [name] on 06/18/2021 at 9:00 am by phone enabled telehealth application and verified that I am speaking withthe correct person using two identifiers. Patient does not have a computer or Internet.   I discussed the limitations of evaluation and management by telemedicine and the availability of in person appointments. The patient expressed understanding and agreed to proceed.  LOCATION:  Patient: Home Provider: Home Office  Treatment Goal Addressed: Develop the ability to recognize, accept, and cope with feelings of depression AEB self report.  Progress Towards Goal: Progressing   History of Present Illness: Pt was referred for OP therapy by her neurologist Dr. Charlotte Crumb for depression.   Observations/Objective: Patient presented for today's session on time and was alert, oriented x5, with no evidence or self-report of SI/HI or A/V H.  Patient reported ongoing compliance with medication and denied any use of alcohol or illicit substances. Clinician inquired about patient's current emotional ratings, as well as any significant changes in thoughts, feelings or behavior since previous session, nor panic attacks.  Patient reported emotional ratings of 4/10 for depression, 4/10 for anxiety, 2/10 for anger/irritability.  Cln and pt reviewed emotional ratings and coping skills. Pt reports no seizures experienced since last session. Cln used CBT to process update on family, daughter/grandchildren, health concerns, seizures. Pt reports, "I talked to my sister who gave pt an update on daughter and grandchildren. I have concerns but I don't want drama in my life and have to protect myself." Cln asked open-ended questions. Cln reminded patient the importance of using boundaries with her family. Cln encouraged pt continue her daily exercise regimen (walking), which she continues.    Collaboration of Care: Other: None needed at this  session.  Patient/Guardian was advised Release of Information must be obtained prior to any record release in order to collaborate their care with an outside provider. Patient/Guardian was advised if they have not already done so to contact the registration department to sign all necessary forms in order for [place] to release information regarding their care.   Consent: Patient/Guardian gives verbal consent for treatment and assignment of benefits for services provided during this visit. Patient/Guardian expressed understanding and agreed to proceed.      Assessment and plan: Counselor will continue to meet with patient to address treatment plan goals. Patient will continue to follow recommendations of providers and implement skills learned in session.  Follow Up Instructions:   I discussed the assessment and treatment plan with the patient. The patient was provided an opportunity to ask questions and all were answered. The patient agreed with the plan and demonstrated an understanding of the instructions.   The patient was advised to call back or seek an in-person evaluation if the symptoms worsen or if the condition fails to improve as anticipated.  I provided 45 minutes of non-face-to-face time during this encounter.   [name] S, LCAS

## 2021-06-18 NOTE — Chronic Care Management (AMB) (Signed)
Patient appearing on report for [employer] - Hypertension Control report due to last documented ambulatory blood pressure of 209/112 on 06/10/21 . Next appointment with PCP is 08/19/21   Outreached patient to discuss hypertension control and medication management.   Current antihypertensives: amlodipine 10 mg daily, carvedilol 12.5 mg twice daily, hydralazine 50 mg three times daily,   Patient has an automated upper arm home BP machine. Needs to get batteries  Extensive dietary/lifestyle discussion with Dr. Joya Gaskins last week  Patient reports hypotensive signs and symptoms including dizziness, lightheadedness.  Patient denies hypertensive symptoms including headache, chest pain, shortness of breath.  Patient denies side effects related to any of her medications   Assessment/Plan: - Currently uncontrolled - - Reviewed goal blood pressure <130/80 - Reviewed appropriate administration of medication regimen - Reviewed appropriate home BP monitoring technique (avoid caffeine, smoking, and exercise for 30 minutes before checking, rest for at least 5 minutes before taking BP, sit with feet flat on the floor and back against a hard surface, uncross legs, and rest arm on flat surface) - Reviewed to check blood pressure daily, document, and provide at next provider visit  Provided the following list to guide filling a four times daily pill box:  Morning: - Carvedilol 12.5 mg  - Hydralazine 50 mg  - Spironolactone 25 mg  - Keppra 500 mg  - Metformin 500 mg   Midday: - Hydralazine 50 mg  - Keppra 500 mg   Evening: - Hydralazine 50 mg  - Amlodipine 10 mg  - Carvedilol 12.5 mg  - Keppra 500 mg   Bedtime: - Mirtazepine   Will call next week for follow up.   Catie Hedwig Morton, PharmD, [facility] [phone]

## 2021-06-19 ENCOUNTER — Encounter (HOSPITAL_COMMUNITY): Payer: Self-pay | Admitting: Psychiatry

## 2021-06-19 ENCOUNTER — Ambulatory Visit (HOSPITAL_BASED_OUTPATIENT_CLINIC_OR_DEPARTMENT_OTHER): Payer: Medicaid Other | Admitting: Psychiatry

## 2021-06-19 ENCOUNTER — Other Ambulatory Visit: Payer: Self-pay

## 2021-06-19 DIAGNOSIS — F331 Major depressive disorder, recurrent, moderate: Secondary | ICD-10-CM

## 2021-06-19 DIAGNOSIS — F5101 Primary insomnia: Secondary | ICD-10-CM | POA: Diagnosis not present

## 2021-06-19 MED ORDER — MIRTAZAPINE 30 MG PO TABS
30.0000 mg | ORAL_TABLET | Freq: Every day | ORAL | 0 refills | Status: DC
  Filled 2021-06-19 – 2021-06-20 (×2): qty 90, 90d supply, fill #0

## 2021-06-19 NOTE — Progress Notes (Signed)
BH MD/PA/NP OP Progress Note  06/19/2021 11:58 AM [name]  MRN:  [id number]  Chief Complaint:  Chief Complaint  Patient presents with   Follow-up   HPI: [name] is doing ok. She has been struggling to control her hypertension. She has ongoing seizures and the last time was May 15th. [name] is following up with her PCP in July. She has been reluctant to seek help when her BP is high for fear of being a burden to others. Her sister has been telling her that she is not. Most nights she sleeps well and is getting about 5-6 hrs/night. [name] is napping most days. Last week she was feeling down due the rainy weather. As soon as the sun came out her mood improved. When she sees something she doesn't like on tv she turns it off. Her benefits are being decreased again and she is living on a strict budget. [name] denies isolation and anhedonia. She denies SI/HI.   Visit Diagnosis:    ICD-10-CM   1. Major depressive disorder, recurrent episode, moderate (HCC)  F33.1 mirtazapine (REMERON) 30 MG tablet    2. Primary insomnia  F51.01 mirtazapine (REMERON) 30 MG tablet      Past Psychiatric History: no updates or changes per patient  Past Medical History:  Past Medical History:  Diagnosis Date   Anxiety    Carpal tunnel syndrome    Diabetes mellitus without complication (HCC)    Diabetes mellitus, type II (HCC)    DYSTONIA 01/04/2007   Qualifier: Diagnosis of  By: Humberto Seals NP, Darl Pikes     Facial droop 10/09/2013   Hemorrhoid    Hypertension    Major depressive disorder, recurrent episode (HCC) 03/18/2006   Qualifier: Diagnosis of  By: Bradly Bienenstock     Obesity, unspecified 10/17/2007   Qualifier: Diagnosis of  By: Gerilyn Pilgrim PHD, Jeannie     Prediabetes 10/09/2013   Rectal bleeding 07/03/2015   Seizures (HCC)    Seizures (HCC) 08/24/2013    Past Surgical History:  Procedure Laterality Date   ABDOMINAL HYSTERECTOMY     COLONOSCOPY N/A 07/15/2015   Dr. Darrick Penna: small-mouth diverticula, small  non-bleeding internal hemorrhoids, redundant colon    HERNIA REPAIR     MYOMECTOMY      Family Psychiatric History: none per patient  Family History:  Family History  Problem Relation Age of Onset   Asthma Mother    Hypertension Father    Heart disease Father    Stroke Father    Cancer Sister        uterus cancer    Diabetes Maternal Uncle    Breast cancer Cousin    Colon cancer Neg Hx     Social History:  Social History   Socioeconomic History   Marital status: Divorced    Spouse name: Not on file   Number of children: 1   Years of education: Not on file   Highest education level: Not on file  Occupational History   Not on file  Tobacco Use   Smoking status: Never   Smokeless tobacco: Never  Vaping Use   Vaping Use: Never used  Substance and Sexual Activity   Alcohol use: No    Alcohol/week: 0.0 standard drinks   Drug use: No   Sexual activity: Never  Other Topics Concern   Not on file  Social History Narrative   Right handed    Lives alone    Lives in appartment   Social Determinants of Health   Financial  Resource Strain: Not on file  Food Insecurity: Food Insecurity Present   Worried About Programme researcher, broadcasting/film/video in the Last Year: Sometimes true   Barista in the Last Year: Never true  Transportation Needs: Not on file  Physical Activity: Not on file  Stress: Not on file  Social Connections: Not on file    Allergies:  Allergies  Allergen Reactions   Codeine Anaphylaxis   Shellfish Allergy Swelling and Rash    Throat Sweling   Apple Cider Vinegar     syncope   Cinnamon Hives   Cozaar [Losartan]     Makes her feel like she is having a heart attack   Lisinopril     cough   Opium     Interferes with Keppra   Banana Rash    Cannot eat any while taking KEPPRA   Catfish [Fish Allergy] Rash   Orange Fruit [Citrus] Rash    Cannot have while taking KEPPRA   Tomato Itching and Rash    Metabolic Disorder Labs: Lab Results  Component  Value Date   HGBA1C 6.0 (H) 01/29/2021   No results found for: PROLACTIN Lab Results  Component Value Date   CHOL 144 01/29/2021   TRIG 155 (H) 01/29/2021   HDL 49 01/29/2021   CHOLHDL 2.9 01/29/2021   VLDL 25 02/18/2016   LDLCALC 68 01/29/2021   LDLCALC 63 12/26/2019   Lab Results  Component Value Date   TSH 1.660 12/26/2019   TSH 2.510 05/02/2019    Therapeutic Level Labs: No results found for: LITHIUM No results found for: VALPROATE No components found for:  CBMZ  Current Medications: Current Outpatient Medications  Medication Sig Dispense Refill   amLODipine (NORVASC) 10 MG tablet TAKE 1 TABLET (10 MG TOTAL) BY MOUTH DAILY. 90 tablet 0   Blood Pressure Monitor DEVI Use as directed to check home blood pressure 2-3 times a week 1 each 0   carvedilol (COREG) 12.5 MG tablet Take 1 tablet (12.5 mg total) by mouth 2 (two) times daily with a meal. 60 tablet 3   hydrALAZINE (APRESOLINE) 50 MG tablet Take 1 tablet (50 mg total) by mouth 3 (three) times daily. 90 tablet 6   levETIRAcetam (KEPPRA XR) 500 MG 24 hr tablet Take 1 tablet by mouth three times a day 270 tablet 3   loratadine (CLARITIN) 10 MG tablet TAKE 1 TABLET (10 MG TOTAL) BY MOUTH DAILY. 60 tablet 2   metFORMIN (GLUCOPHAGE) 500 MG tablet TAKE 1 TABLET (500 MG TOTAL) BY MOUTH DAILY WITH BREAKFAST. 90 tablet 2   spironolactone (ALDACTONE) 25 MG tablet Take 1 tablet (25 mg total) by mouth daily. 60 tablet 2   tretinoin (RETIN-A) 0.05 % cream Apply topically at bedtime. 45 g 0   Vitamin D, Ergocalciferol, (DRISDOL) 1.25 MG (50000 UNIT) CAPS capsule Take 1 capsule by mouth by mouth once a week. 12 capsule 3   calcium carbonate (OS-CAL) 1250 (500 Ca) MG chewable tablet Chew 1 tablet by mouth daily. (Patient not taking: Reported on 06/19/2021)     mirtazapine (REMERON) 30 MG tablet Take 1 tablet (30 mg total) by mouth at bedtime. 90 tablet 0   No current facility-administered medications for this visit.      Musculoskeletal: Strength & Muscle Tone: within normal limits Gait & Station: normal Patient leans: Front  Psychiatric Specialty Exam: Review of Systems  Blood pressure (!) 191/109, pulse 92, temperature 98.1 F (36.7 C), height 5\' 1"  (1.549 m), weight 214  lb 6.4 oz (97.3 kg), SpO2 97 %.Body mass index is 40.51 kg/m.  General Appearance: Casual and Neat  Eye Contact:  Good  Speech:  Clear and Coherent and Slow  Volume:  Normal  Mood:  Euthymic  Affect:   mildly elevated per her usual  Thought Process:  Coherent and Descriptions of Associations: Circumstantial, concrete  Orientation:  Full (Time, Place, and Person)  Thought Content: Rumination   Suicidal Thoughts:  No  Homicidal Thoughts:  No  Memory:  Immediate;   Good  Judgement:  Fair  Insight:  Good  Psychomotor Activity:  Normal  Concentration:  Concentration: Good  Recall:  Good  Fund of Knowledge: Good  Language: Good  Akathisia:  No  Handed:  Right  AIMS (if indicated): not done  Assets:  Communication Skills Desire for Improvement Financial Resources/Insurance Housing Resilience Social Support Talents/Skills Transportation Vocational/Educational  ADL's:  Intact  Cognition: WNL  Sleep:  Fair   Screenings: GAD-7    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 [place] Office Visit from 12/26/2019 in [place] Office Visit from 03/07/2019 in [place] Office Visit from 12/26/2018 in [place] Office Visit from 08/02/2018 in [place]  Total GAD-7 Score 9 0 0 0 7      PHQ2-9    [place] Office Visit from 06/19/2021 in [facility] Office Visit from 04/29/2021 in [place] Video Visit from 04/10/2021 in [facility] Video Visit from  01/09/2021 in [place] [facility] Video Visit from 10/17/2020 in [facility]  PHQ-2 Total Score 1 0 1 0 1      [place] Office Visit from 06/19/2021 in [facility] Video Visit from 04/10/2021 in [facility] Video Visit from 01/09/2021 in [place] No Risk No Risk No Risk        Assessment and Plan:   Continue Remeron for depression as she reports good benefit.  Reviewed labs done 01/29/21 and 06/10/21- HbA1c elevated, triglycerides elevated. She is working with her PCP for management.   Collaboration of Care: Collaboration of Care: Other none today  Patient/Guardian was advised Release of Information must be obtained prior to any record release in order to collaborate their care with an outside provider. Patient/Guardian was advised if they have not already done so to contact the registration department to sign all necessary forms in order for [place] to release information regarding their care.   Consent: Patient/Guardian gives verbal consent for treatment and assignment of benefits for services provided during this visit. Patient/Guardian expressed understanding and agreed to proceed.     Medication management with supportive therapy. Risks and benefits, side effects and alternative treatment options discussed with patient. Pt was given an opportunity to ask questions about medication, illness, and treatment. All current psychiatric medications have been reviewed and discussed with the patient and adjusted as clinically appropriate. The patient has been provided an accurate and updated list of the medications being now prescribed. Pt verbalized understanding and verbal consent obtained for treatment.   F/up in 2-3 months or sooner if needed  The duration of this appointment  visit was 20 minutes of face-to-face time with the patient.  Greater than 50% of this time was spent in counseling, explanation of  diagnosis, planning of further management, and coordination of care     Charlcie Cradle, MD 06/19/2021, 11:58 AM

## 2021-06-20 ENCOUNTER — Other Ambulatory Visit: Payer: Self-pay

## 2021-06-23 ENCOUNTER — Encounter (HOSPITAL_COMMUNITY): Payer: Self-pay | Admitting: Licensed Clinical Social Worker

## 2021-06-23 ENCOUNTER — Ambulatory Visit (INDEPENDENT_AMBULATORY_CARE_PROVIDER_SITE_OTHER): Payer: Medicaid Other | Admitting: Licensed Clinical Social Worker

## 2021-06-23 DIAGNOSIS — F331 Major depressive disorder, recurrent, moderate: Secondary | ICD-10-CM

## 2021-06-23 NOTE — Progress Notes (Signed)
Virtual Visit via Telephone Note  I connected with [name] on 06/23/2021 at 9:00 am by phone enabled telehealth application and verified that I am speaking withthe correct person using two identifiers. Patient does not have a computer or Internet.   I discussed the limitations of evaluation and management by telemedicine and the availability of in person appointments. The patient expressed understanding and agreed to proceed.  LOCATION:  Patient: Home Provider: Home Office  Treatment Goal Addressed: Develop the ability to recognize, accept, and cope with feelings of depression AEB self report.  Progress Towards Goal: Progressing   History of Present Illness: Pt was referred for OP therapy by her neurologist Dr. Alma Lions for depression.   Observations/Objective: Patient presented for today's session on time and was alert, oriented x5, with no evidence or self-report of SI/HI or A/V H.  Patient reported ongoing compliance with medication and denied any use of alcohol or illicit substances. Clinician inquired about patient's current emotional ratings, as well as any significant changes in thoughts, feelings or behavior since previous session, nor panic attacks.  Patient reported emotional ratings of 4/10 for depression, 4/10 for anxiety, 2/10 for anger/irritability.  Cln and pt reviewed emotional ratings and coping skills. Pt reports 2 seizures experienced within the last month. Cln and pt explored triggers for the seizures (running to catch the bus and walking too fast during my morning walk.)  Clinician utilized CBT to process concerns and challenges. Clinician processed thoughts, feelings, and behaviors. Clinician discussed coping skills and options for supporting herself through these challenging times,  providing update on family, daughter/grandchildren, health concerns, stress and psychiatrist appointment. Cln reminded patient the importance of using boundaries with her family. Cln  encouraged pt continue her daily exercise regimen (walking), which she continues.    Collaboration of Care: Other: None needed at this session.  Patient/Guardian was advised Release of Information must be obtained prior to any record release in order to collaborate their care with an outside provider. Patient/Guardian was advised if they have not already done so to contact the registration department to sign all necessary forms in order for [place] to release information regarding their care.   Consent: Patient/Guardian gives verbal consent for treatment and assignment of benefits for services provided during this visit. Patient/Guardian expressed understanding and agreed to proceed.      Assessment and plan: Counselor will continue to meet with patient to address treatment plan goals. Patient will continue to follow recommendations of providers and implement skills learned in session.  Follow Up Instructions:   I discussed the assessment and treatment plan with the patient. The patient was provided an opportunity to ask questions and all were answered. The patient agreed with the plan and demonstrated an understanding of the instructions.   The patient was advised to call back or seek an in-person evaluation if the symptoms worsen or if the condition fails to improve as anticipated.  I provided 45 minutes of non-face-to-face time during this encounter.   Jadan Rouillard S, LCAS

## 2021-06-27 ENCOUNTER — Other Ambulatory Visit: Payer: Self-pay | Admitting: Pharmacist

## 2021-06-27 ENCOUNTER — Other Ambulatory Visit: Payer: Self-pay

## 2021-06-27 DIAGNOSIS — I10 Essential (primary) hypertension: Secondary | ICD-10-CM

## 2021-06-27 MED ORDER — BLOOD PRESSURE CUFF MISC: 0 refills | Status: DC

## 2021-06-27 MED ORDER — AMLODIPINE BESYLATE 10 MG PO TABS: ORAL_TABLET | Freq: Every day | ORAL | 0 refills | Status: DC

## 2021-06-27 NOTE — Chronic Care Management (AMB) (Signed)
Chief Complaint  Patient presents with   Hypertension   High Risk Managed Medicaid    [name] is a 66 y.o. year old female.   They were referred to the pharmacist by a quality report for assistance in managing hypertension.   I called her for follow up and she was on the way to the pharmacy to have her blood pressure checked.  She presented to the [employer] [facility] at [facility] today for a blood pressure check and visit. She was seen by Jerry Caras, PharmD Candidate.   Patient is participating in a Managed Medicaid Plan:  Yes  Subjective:  Care Team: Primary Care Provider: Marcine Matar, MD ; Next Scheduled Visit: 07/15/21 with pharmacist, 08/19/21 with Dr. Delford Field Neurologist: Karel Jarvis; Next Scheduled Visit: 07/01/21 Therapist: Thea Silversmith; Next Scheduled Visit: 07/07/21  Medication Access/Adherence  Current Pharmacy:  [facility] at [facility] [address] [place] [address] Phone: [phone] Fax: [phone]  [facility] [place], [place] - [address] [place] [address] Phone: [phone] Fax: [phone]  [facility] [address], [place] - [address] [place] [address] Phone: [phone] Fax: [phone]  [facility] - [address] ([place]), [place] - [address] [place] [address] Phone: [phone] Fax: [phone]   Patient reports affordability concerns with their medications: No  Patient reports access/transportation concerns to their pharmacy: No  Patient reports adherence concerns with their medications:  Yes  lost amlodipine for ~ 2 weeks but just found it, so will start taking.    Hypertension:  Current medications: amlodipine 10 mg daily- has not been taking, carvedilol 12.5 mg twice daily, hydralazine 50  mg three times daily   Patient has an automated wrist cuff that reads higher than the clinic cuff. Instructed to consider getting upper arm cuff  Health Maintenance  There are no preventive care reminders to display for this patient.   Objective: Lab Results  Component Value Date   HGBA1C 6.0 (H) 01/29/2021    Lab Results  Component Value Date   CREATININE 0.87 06/10/2021   BUN 5 (L) 06/10/2021   NA 141 06/10/2021   K 4.9 06/10/2021   CL 103 06/10/2021   CO2 26 06/10/2021    Lab Results  Component Value Date   CHOL 144 01/29/2021   HDL 49 01/29/2021   LDLCALC 68 01/29/2021   TRIG 155 (H) 01/29/2021   CHOLHDL 2.9 01/29/2021    Medications Reviewed Today     Reviewed by Vernona Rieger, LCAS (Counselor) on 06/23/21 at 1035  Med List Status: <None>   Medication Order Taking? Sig Documenting Provider Last Dose Status Informant  amLODipine (NORVASC) 10 MG tablet [id number] No TAKE 1 TABLET (10 MG TOTAL) BY MOUTH DAILY. [name], MD Taking Active            Med Note Ladona Ridgel, Betha Loa Jun 19, 2021 10:23 AM) Leanora Ivanoff in the evening  Blood Pressure Monitor DEVI [id number] No Use as directed to check home blood pressure 2-3 times a week [name], MD Taking Active   calcium carbonate (OS-CAL) 1250 (500 Ca) MG chewable tablet [id number] No Chew 1 tablet by mouth daily.  Patient not taking: Reported on 06/19/2021   [provider] Not Taking Active   carvedilol (COREG)  12.5 MG tablet [id number] No Take 1 tablet (12.5 mg total) by mouth 2 (two) times daily with a meal. [name], MD Taking Active            Med Note Ladona Ridgel, Betha Loa Jun 19, 2021 10:24 AM) Takes morning and evening  hydrALAZINE (APRESOLINE) 50 MG tablet [id number] No Take 1 tablet (50 mg total) by mouth 3 (three) times daily. [name], MD Taking Active            Med Note Ladona Ridgel, Betha Loa Jun 19, 2021 10:23 AM) Takes morning, midday, and evening  levETIRAcetam  (KEPPRA XR) 500 MG 24 hr tablet [id number] No Take 1 tablet by mouth three times a day [name], MD Taking Active            Med Note Ladona Ridgel, Betha Loa Jun 19, 2021 10:26 AM) Takes morning, midday, and evening   loratadine (CLARITIN) 10 MG tablet [id number] No TAKE 1 TABLET (10 MG TOTAL) BY MOUTH DAILY. [name], MD Taking Active   metFORMIN (GLUCOPHAGE) 500 MG tablet [id number] No TAKE 1 TABLET (500 MG TOTAL) BY MOUTH DAILY WITH BREAKFAST. [name], MD Taking Active   mirtazapine (REMERON) 30 MG tablet [id number]  Take 1 tablet (30 mg total) by mouth at bedtime. Oletta Darter, MD  Active   spironolactone (ALDACTONE) 25 MG tablet [id number] No Take 1 tablet (25 mg total) by mouth daily. [name], MD Taking Active            Med Note Ladona Ridgel, Betha Loa Jun 19, 2021 10:22 AM) Leanora Ivanoff 1 in the morning  tretinoin (RETIN-A) 0.05 % cream [id number] No Apply topically at bedtime. [name], MD Taking Active   Vitamin D, Ergocalciferol, (DRISDOL) 1.25 MG (50000 UNIT) CAPS capsule [id number] No Take 1 capsule by mouth by mouth once a week. [name], MD Taking Active               Assessment/Plan:  Care Plan : Medication Management  Updates made by Alden Hipp, RPH-CPP since 06/27/2021 12:00 AM     Problem: Hypertension      Long-Range Goal: BP <130/80   Note:   Current Barriers:  Unable to achieve control of blood pressure   Patient Needs: Therapy optimization  Patient Activities: Patient will:  - take medications as prescribed as evidenced by patient report and record review check blood pressure daily, document, and provide at future appointments      Hypertension: - Currently uncontrolled but improving.  - Reviewed appropriate blood pressure monitoring technique and reviewed goal blood pressure. Recommended to check home blood pressure and heart rate daily.  - Discussed switching to OMRON 3 wrist cuff. Discussed  dietary, exercise, and stress reduction habits. Pt does not have a phone with internet (flip-phone) and has to use computer lab [facility] the [facility] center if wants to use internet.    Follow Up Plan: phone call in 2 weeks  Catie TClearance Coots, PharmD, [facility] [phone]

## 2021-06-30 ENCOUNTER — Ambulatory Visit (INDEPENDENT_AMBULATORY_CARE_PROVIDER_SITE_OTHER): Payer: Medicaid Other | Admitting: Licensed Clinical Social Worker

## 2021-06-30 ENCOUNTER — Encounter (HOSPITAL_COMMUNITY): Payer: Self-pay | Admitting: Licensed Clinical Social Worker

## 2021-06-30 DIAGNOSIS — F331 Major depressive disorder, recurrent, moderate: Secondary | ICD-10-CM | POA: Diagnosis not present

## 2021-06-30 NOTE — Progress Notes (Signed)
Virtual Visit via Telephone Note  I connected with [name] on 06/30/2021 at 9:00 am by phone enabled telehealth application and verified that I am speaking withthe correct person using two identifiers. Patient does not have a computer or Internet.   I discussed the limitations of evaluation and management by telemedicine and the availability of in person appointments. The patient expressed understanding and agreed to proceed.  LOCATION:  Patient: Home Provider: Home Office  Treatment Goal Addressed: Develop the ability to recognize, accept, and cope with feelings of depression AEB self report.  Progress Towards Goal: Progressing   History of Present Illness: Pt was referred for OP therapy by her neurologist Dr. [place] Lions for depression.   Observations/Objective: Patient presented for today's session on time and was alert, oriented x5, with no evidence or self-report of SI/HI or A/V H.  Patient reported ongoing compliance with medication and denied any use of alcohol or illicit substances. Clinician inquired about patient's current emotional ratings, as well as any significant changes in thoughts, feelings or behavior since previous session, nor panic attacks.  Patient reported emotional ratings of 7/10 for depression, 6/10 for anxiety, 3/10 for anger/irritability.  Cln and pt reviewed emotional ratings and coping skills. Pt reports no seizures since last session. Cln and pt explored reasons for continued seizures (being around a lot of people). Clinician utilized CBT to process concerns and challenges. Clinician processed thoughts, feelings, and behaviors. Clinician discussed coping skills and options for supporting herself through these challenging times,  providing update on family, daughter/grandchildren, health concerns, and stress. Cln encouraged pt continue her daily exercise regimen (walking), which she continues.    Collaboration of Care: Other: None needed at this  session.  Patient/Guardian was advised Release of Information must be obtained prior to any record release in order to collaborate their care with an outside provider. Patient/Guardian was advised if they have not already done so to contact the registration department to sign all necessary forms in order for [place] to release information regarding their care.   Consent: Patient/Guardian gives verbal consent for treatment and assignment of benefits for services provided during this visit. Patient/Guardian expressed understanding and agreed to proceed.      Assessment and plan: Counselor will continue to meet with patient to address treatment plan goals. Patient will continue to follow recommendations of providers and implement skills learned in session.  Follow Up Instructions:   I discussed the assessment and treatment plan with the patient. The patient was provided an opportunity to ask questions and all were answered. The patient agreed with the plan and demonstrated an understanding of the instructions.   The patient was advised to call back or seek an in-person evaluation if the symptoms worsen or if the condition fails to improve as anticipated.  I provided 45 minutes of non-face-to-face time during this encounter.   Daleen Steinhaus S, LCAS

## 2021-07-01 ENCOUNTER — Ambulatory Visit: Payer: Medicaid Other | Admitting: Neurology

## 2021-07-01 ENCOUNTER — Encounter: Payer: Self-pay | Admitting: Neurology

## 2021-07-01 VITALS — BP 142/84 | HR 78 | Ht 60.0 in | Wt 214.0 lb

## 2021-07-01 DIAGNOSIS — R569 Unspecified convulsions: Secondary | ICD-10-CM

## 2021-07-01 NOTE — Progress Notes (Signed)
NEUROLOGY FOLLOW UP OFFICE NOTE  [name] ZI:[id number] 1955/03/16  HISTORY OF PRESENT ILLNESS: I had the pleasure of seeing [name] in follow-up in the neurology clinic on 07/01/2021.  The patient was last seen a year ago. She has a diagnosis of seizures with recurrent episodes of loss of consciousness with report of right-sided symptoms initially, then she started having symptoms preceded by pain and discomfort in the her extremities. 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 She was started on Keppra at [facility] last 2015 for seizures. Her 48-hour EEG in 2018 was normal.  Over the past year, she reports 2 mild "seizure" in May. On 5/13, she was cleaning the bathroom and thinks the bleach smell got to her. She felt dizzy for 2 minutes, she just sat still, no loss of consciousness, shaking, or fall. On 5/13, she was walking too fast and felt dizzy for 3 minutes, she had to stop and relax. No confusion. She denies any further shaking since her daughter moved to [place]. She had noticed that when her daughter was stressing her out, her right hand would shake. 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 She continues to see Psychiatry and speak to her therapist every week. Sleep is good with mirtazapine. She denies any headaches. She ambulates with a cane for balance and denies any falls.    History on Initial Assessment 11/2014: This is a pleasant 66 yo RH woman with a history of hypertension and a diagnosis of seizures. She reports that neurological symptoms started in 2005 when she would have involuntary movements of the right leg. She states she had lost her grandmother and this had an effect on her, and had symptoms since then. She had seen neurologist Dr. Boyd Kerbs at that time and was diagnosed  with focal limb dystomia, told that this may be caused by a chemical imbalance. She was treated with Botox but did not tolerate it. She reports seeing Dr. Trula Ore from 2006 to 2009, but also at the same time she started having blackouts. She reports that she had a blackout in her doctor's office one time, but instead of being brought to [facility] ER, she was brought to [facility]. She has had blackouts so bad that neighbors would tell her she needed to go to the ER. She usually starts feeling pain in both legs, followed by right-sided shaking, numbness on the left side of her face, throat gets tight, then she falls to the floor. She lives by herself and has woken up on the floor. She reports that she can see herself shaking and would go into a fetal position to "ride it out."   In August 2015, she was brought to the ER due to recent increase in frequency of spells. She reported bilateral numbness and tingling as well as a feeling of discomfort in both upper and lower extremities. She has been told she has convulsions involving her legs. She reported urinary incontinence and tongue soreness. Her BP was very high at that time. 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 No definite gliosis of heterotopic gray matter. Her routine EEG was normal. She was  discharged home on Keppra 1000mg  BID which she has been tolerating without side effects. She reports doing well with no similar symptoms until the October 2016. She brings a calendar of her events, She had one last 10/30/14. She reports she had been stressed because her daughter told her that day that she is moving to [place] to what her mother thinks is an unhealthy relationship. She had another episode on 10/30 while walking. On 11/25/14 she had 2 episodes, one  lasting 12 minutes, another one lasting an hour. She reports being in the shower, she grabbed the handle and sat in the tub rocking in a fetal position, which she found helps. She believes stress is causing the recent seizures and talking to a therapist does help her. She states she does not know how to relax. 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 There is no history of febrile convulsions, CNS infections such as meningitis/encephalitis, significant traumatic brain injury, neurosurgical procedures, or family history of seizures.  Diagnostic Data: She had a 48-hour EEG which was normal. She reported numbness in her right hand and arm, sharp stomach pain, and dizziness. There were no epileptiform discharges seen during these events. The day she reported the dizziness, she did not push the button, but there was note a a transient significant increase in heart rate from 78 bpm to 168 bpm for around 2 minutes, she was not on video at that time. She had a myocardial perfusion scan last 11/2016 which showed an EF of 66%, small defect of moderate severity in the mid anterior and apical anterior location consistent with ischemia, but given normal systolic function, could not rule out breast attenuation artifact. She was started on a beta blocker with improvement in tachycardia, 30-day holter with normal sinus rhythm, no arrhythmia. She has been evaluated by Cardiology.   PAST MEDICAL HISTORY: Past Medical History:  Diagnosis Date   Anxiety    Carpal tunnel syndrome    Diabetes mellitus without complication ([place])    Diabetes mellitus, type II ([place])    DYSTONIA 01/04/2007   Qualifier: Diagnosis of  By: Zebedee Iba NP, Manuela Schwartz     Facial droop 10/09/2013   Hemorrhoid    Hypertension    Major depressive disorder, recurrent episode ([place]) 03/18/2006   Qualifier: Diagnosis of  By: Beryle Lathe     Obesity, unspecified 10/17/2007   Qualifier: Diagnosis of  By: Jenne Campus PHD,  Jeannie     Prediabetes 10/09/2013   Rectal bleeding 07/03/2015   Seizures ([place])    Seizures ([place]) 08/24/2013    MEDICATIONS: Current Outpatient Medications on File Prior to Visit  Medication Sig Dispense Refill   amLODipine (NORVASC) 10 MG tablet TAKE 1 TABLET (10 MG TOTAL) BY MOUTH DAILY. 90 tablet 0   Blood Pressure Monitor DEVI Use as directed to check home blood pressure 2-3 times a week 1 each 0   Blood Pressure Monitoring (BLOOD PRESSURE CUFF) MISC Use to check blood pressure once daily. 1 each 0   calcium carbonate (OS-CAL) 1250 (500 Ca) MG chewable tablet Chew 1 tablet by mouth daily. (Patient not taking: Reported on 06/19/2021)     carvedilol (COREG) 12.5 MG tablet Take 1 tablet (12.5 mg total) by mouth 2 (two) times daily with a meal. 60 tablet 3   hydrALAZINE (APRESOLINE) 50 MG tablet Take 1 tablet (50 mg total) by mouth 3 (three) times daily. 90 tablet 6   levETIRAcetam (KEPPRA XR) 500 MG 24 hr  tablet Take 1 tablet by mouth three times a day 270 tablet 3   loratadine (CLARITIN) 10 MG tablet TAKE 1 TABLET (10 MG TOTAL) BY MOUTH DAILY. 60 tablet 2   metFORMIN (GLUCOPHAGE) 500 MG tablet TAKE 1 TABLET (500 MG TOTAL) BY MOUTH DAILY WITH BREAKFAST. 90 tablet 2   mirtazapine (REMERON) 30 MG tablet Take 1 tablet (30 mg total) by mouth at bedtime. 90 tablet 0   spironolactone (ALDACTONE) 25 MG tablet Take 1 tablet (25 mg total) by mouth daily. 60 tablet 2   tretinoin (RETIN-A) 0.05 % cream Apply topically at bedtime. 45 g 0   Vitamin D, Ergocalciferol, (DRISDOL) 1.25 MG (50000 UNIT) CAPS capsule Take 1 capsule by mouth by mouth once a week. 12 capsule 3   No current facility-administered medications on file prior to visit.    ALLERGIES: Allergies  Allergen Reactions   Codeine Anaphylaxis   Shellfish Allergy Swelling and Rash    Throat Sweling   Apple Cider Vinegar     syncope   Cinnamon Hives   Cozaar [Losartan]     Makes her feel like she is having a heart attack   Lisinopril      cough   Opium     Interferes with Keppra   Banana Rash    Cannot eat any while taking KEPPRA   Catfish [Fish Allergy] Rash   Orange Fruit [Citrus] Rash    Cannot have while taking KEPPRA   Tomato Itching and Rash    FAMILY HISTORY: Family History  Problem Relation Age of Onset   Asthma Mother    Hypertension Father    Heart disease Father    Stroke Father    Cancer Sister        uterus cancer    Diabetes Maternal Uncle    Breast cancer Cousin    Colon cancer Neg Hx     SOCIAL HISTORY: Social History   Socioeconomic History   Marital status: Divorced    Spouse name: Not on file   Number of children: 1   Years of education: Not on file   Highest education level: Not on file  Occupational History   Not on file  Tobacco Use   Smoking status: Never   Smokeless tobacco: Never  Vaping Use   Vaping Use: Never used  Substance and Sexual Activity   Alcohol use: No    Alcohol/week: 0.0 standard drinks of alcohol   Drug use: No   Sexual activity: Never  Other Topics Concern   Not on file  Social History Narrative   Right handed    Lives alone    Lives in apartment  on 6th floor    No caffeine   Social Determinants of Health   Financial Resource Strain: Low Risk  (06/27/2021)   Overall Financial Resource Strain (CARDIA)    Difficulty of Paying Living Expenses: Not hard at all  Food Insecurity: Food Insecurity Present (08/13/2020)   Hunger Vital Sign    Worried About Running Out of Food in the Last Year: Sometimes true    Ran Out of Food in the Last Year: Never true  Transportation Needs: Not on file  Physical Activity: Not on file  Stress: Not on file  Social Connections: Not on file  Intimate Partner Violence: Not on file     PHYSICAL EXAM: Vitals:   07/01/21 0935  BP: (!) 142/84  Pulse: 78  SpO2: 96%   General: No acute distress Head:  Normocephalic/atraumatic Skin/Extremities:  No rash, no edema Neurological Exam: alert and awake. No aphasia or  dysarthria. Fund of knowledge is appropriate.  Attention and concentration are normal.   Cranial nerves: Pupils equal, round. Extraocular movements intact with no nystagmus. Visual fields full.  No facial asymmetry.  Motor: Bulk and tone normal, muscle strength 5/5 throughout with no pronator drift.   Finger to nose testing intact.  Gait slow and cautious with cane, no ataxia   IMPRESSION: This is a pleasant 66 yo RH woman with a history of hypertension and recurrent episodes of loss of consciousness with report of right-sided symptoms initially, then symptoms changed, preceded by pain and discomfort in her extremities (LE>UE), or provoked by stress with her daughter. 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 She denies any bigger episodes since her daughter moved to [place] (less stress). She reports 2 dizzy spells in May, no loss of awareness. Continue Levetiracetam ER 500mg  TID, refills have been sent by her PCP. Continue follow-up with Behavioral health. She is aware of [place] driving laws to stop driving after a seizure until 6 months seizure-free, she does not drive. Follow-up in 1 year, call for any changes.    Thank you for allowing me to participate in her care.  Please do not hesitate to call for any questions or concerns.    Ellouise Newer, M.D.   CC: Dr. Joya Gaskins

## 2021-07-01 NOTE — Patient Instructions (Signed)
Always good to see you. Keppra refills have been sent by Dr. Delford Field. Follow-up with me in a year, call for any changes.   Seizure Precautions: 1. If medication has been prescribed for you to prevent seizures, take it exactly as directed.  Do not stop taking the medicine without talking to your doctor first, even if you have not had a seizure in a long time.   2. Avoid activities in which a seizure would cause danger to yourself or to others.  Don't operate dangerous machinery, swim alone, or climb in high or dangerous places, such as on ladders, roofs, or girders.  Do not drive unless your doctor says you may.  3. If you have any warning that you may have a seizure, lay down in a safe place where you can't hurt yourself.    4.  No driving for 6 months from last seizure, as per [facility].   Please refer to the following link on the Epilepsy Foundation of America's website for more information: http://www.epilepsyfoundation.org/answerplace/Social/driving/drivingu.cfm   5.  Maintain good sleep hygiene. Avoid alcohol.  6.  Contact your doctor if you have any problems that may be related to the medicine you are taking.  7.  Call 911 and bring the patient back to the ED if:        A.  The seizure lasts longer than 5 minutes.       B.  The patient doesn't awaken shortly after the seizure  C.  The patient has new problems such as difficulty seeing, speaking or moving  D.  The patient was injured during the seizure  E.  The patient has a temperature over 102 F (39C)  F.  The patient vomited and now is having trouble breathing

## 2021-07-07 ENCOUNTER — Encounter (HOSPITAL_COMMUNITY): Payer: Self-pay | Admitting: Licensed Clinical Social Worker

## 2021-07-07 ENCOUNTER — Ambulatory Visit (INDEPENDENT_AMBULATORY_CARE_PROVIDER_SITE_OTHER): Payer: Medicaid Other | Admitting: Licensed Clinical Social Worker

## 2021-07-07 DIAGNOSIS — F331 Major depressive disorder, recurrent, moderate: Secondary | ICD-10-CM

## 2021-07-07 NOTE — Progress Notes (Signed)
Virtual Visit via Telephone Note  I connected with [name] on 07/07/2021 at 10:00 am by phone enabled telehealth application and verified that I am speaking withthe correct person using two identifiers. Patient does not have a computer or Internet.   I discussed the limitations of evaluation and management by telemedicine and the availability of in person appointments. The patient expressed understanding and agreed to proceed.  LOCATION:  Patient: Home Provider: Home Office  Treatment Goal Addressed: Develop the ability to recognize, accept, and cope with feelings of depression AEB self report.  Progress Towards Goal: Progressing   History of Present Illness: Pt was referred for OP therapy by her neurologist Dr. Omro Lions for depression.   Observations/Objective: Patient presented for today's session on time and was alert, oriented x5, with no evidence or self-report of SI/HI or A/V H.  Patient reported ongoing compliance with medication and denied any use of alcohol or illicit substances. Clinician inquired about patient's current emotional ratings, as well as any significant changes in thoughts, feelings or behavior since previous session, nor panic attacks.  Patient reported emotional ratings of 5/10 for depression, 6/10 for anxiety, 2/10 for anger/irritability.  Cln and pt reviewed emotional ratings and coping skills. Pt reports no seizures since last session.  Clinician utilized CBT to process concerns and challenges. Clinician processed thoughts, feelings, and behaviors. Clinician discussed coping skills and options for supporting herself through these challenging times,  providing update on family, daughter/grandchildren, health concerns, and stress. Pt reports she received information about her daughter/grandchildren who live in [place] which concerned her, started to worry. "Then I remembered I can't fix or control them, so I let it go." Cln graduated pt on her decision. Cln encouraged  pt continue her daily exercise regimen (walking), which she continues.    Collaboration of Care: Other: None needed at this session.  Patient/Guardian was advised Release of Information must be obtained prior to any record release in order to collaborate their care with an outside provider. Patient/Guardian was advised if they have not already done so to contact the registration department to sign all necessary forms in order for [place] to release information regarding their care.   Consent: Patient/Guardian gives verbal consent for treatment and assignment of benefits for services provided during this visit. Patient/Guardian expressed understanding and agreed to proceed.      Assessment and plan: Counselor will continue to meet with patient to address treatment plan goals. Patient will continue to follow recommendations of providers and implement skills learned in session.  Follow Up Instructions:   I discussed the assessment and treatment plan with the patient. The patient was provided an opportunity to ask questions and all were answered. The patient agreed with the plan and demonstrated an understanding of the instructions.   The patient was advised to call back or seek an in-person evaluation if the symptoms worsen or if the condition fails to improve as anticipated.  I provided 60 minutes of non-face-to-face time during this encounter.   Caylor Tallarico S, LCAS

## 2021-07-08 ENCOUNTER — Ambulatory Visit: Payer: Medicaid Other | Admitting: Podiatry

## 2021-07-08 DIAGNOSIS — B351 Tinea unguium: Secondary | ICD-10-CM

## 2021-07-08 DIAGNOSIS — M79675 Pain in left toe(s): Secondary | ICD-10-CM | POA: Diagnosis not present

## 2021-07-08 DIAGNOSIS — E119 Type 2 diabetes mellitus without complications: Secondary | ICD-10-CM | POA: Diagnosis not present

## 2021-07-08 DIAGNOSIS — M79674 Pain in right toe(s): Secondary | ICD-10-CM | POA: Diagnosis not present

## 2021-07-11 ENCOUNTER — Other Ambulatory Visit: Payer: Self-pay | Admitting: Pharmacist

## 2021-07-11 VITALS — BP 136/102 | HR 78

## 2021-07-11 DIAGNOSIS — I10 Essential (primary) hypertension: Secondary | ICD-10-CM

## 2021-07-14 ENCOUNTER — Ambulatory Visit (HOSPITAL_COMMUNITY): Payer: Medicaid Other | Admitting: Licensed Clinical Social Worker

## 2021-07-14 NOTE — Progress Notes (Signed)
Subjective: 66 year old female presents the office with concerns of thick, discolored toenails that she cannot trim herself and they are causing discomfort as well as for calluses.  She has no new concerns today.    Marcine Matar, MD  Objective: AAO x3, NAD DP/PT pulses palpable bilaterally, CRT less than 3 seconds Nails are hypertrophic, dystrophic, brittle, discolored, elongated 10. No surrounding redness or drainage. Tenderness nails 1-5 bilaterally. Hyperkeratotic lesion present right submetatarsal 1 as well as bilateral medial rear foot and medial hallux bilaterally.  There is no underlying ulceration drainage or any signs of infection. There is a annular hyper pigmented lesion present on the right third toe consistent with a mole.  Unchanged. No pain with calf compression, swelling, warmth, erythema  Assessment: Type 2 diabetes with neuropathy, symptomatic onychomycosis/hyperkeratotic lesions  Plan: -All treatment options discussed with the patient including all alternatives, risks, complications.  -Sharply debrided nails x10 without any complications or bleeding -Hyperkeratotic lesion sharply debrided x5 without any complications or bleeding as a courtesy as it was minimal. Continue moisturizer and offloading  -Discussed daily foot inspection -Patient encouraged to call the office with any questions, concerns, change in symptoms.   Vivi Barrack DPM

## 2021-07-15 ENCOUNTER — Ambulatory Visit: Payer: Medicaid Other | Admitting: Pharmacist

## 2021-07-21 ENCOUNTER — Ambulatory Visit (HOSPITAL_COMMUNITY): Payer: Medicaid Other | Admitting: Licensed Clinical Social Worker

## 2021-08-03 IMAGING — MG MM DIGITAL SCREENING BILAT W/ TOMO AND CAD
8 series · 8 of 24 positions shown · non-contrast
Comparison: Previous exam(s).

ACR Breast Density Category a: The breast tissue is almost entirely
fatty.

CLINICAL DATA: Screening.

EXAM:
DIGITAL SCREENING BILATERAL MAMMOGRAM WITH TOMOSYNTHESIS AND CAD
TECHNIQUE: Bilateral screening digital craniocaudal and mediolateral oblique
mammograms were obtained. Bilateral screening digital breast
tomosynthesis was performed. The images were evaluated with
computer-aided detection.

[R CC synth-2D]
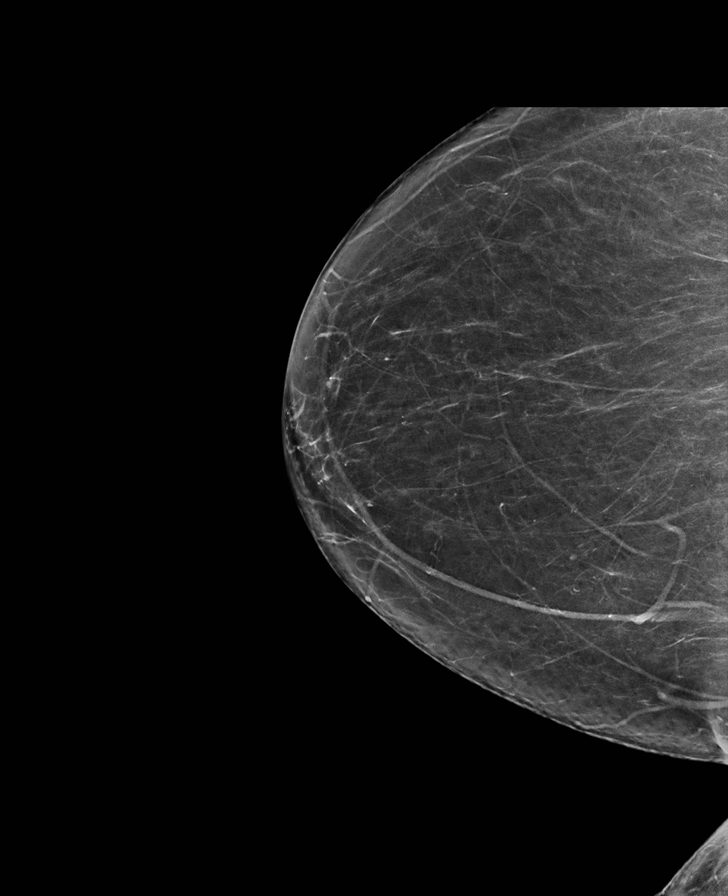

[L MLO synth-2D]
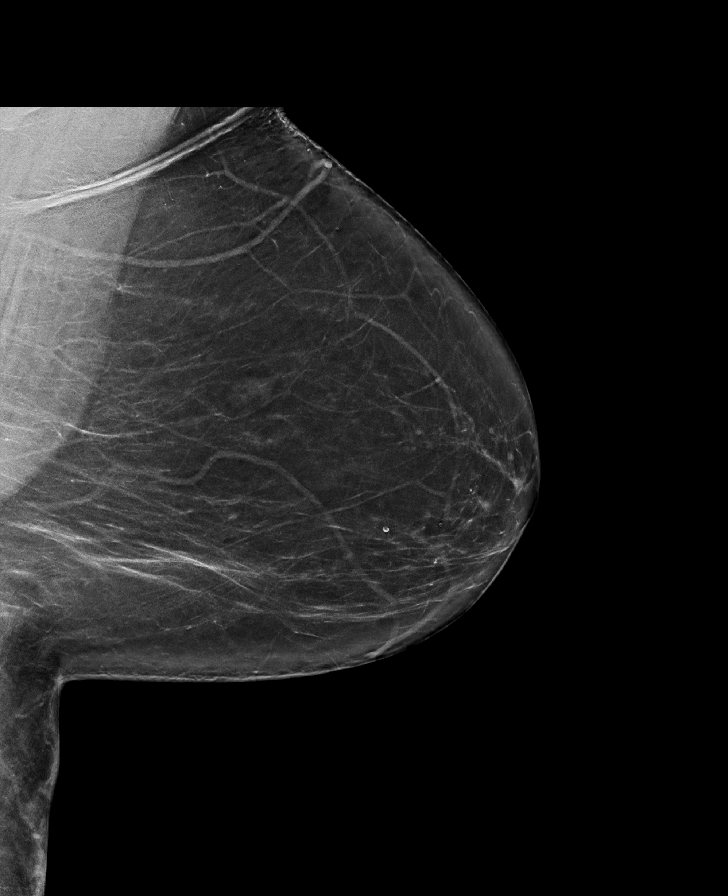

[R MLO synth-2D]
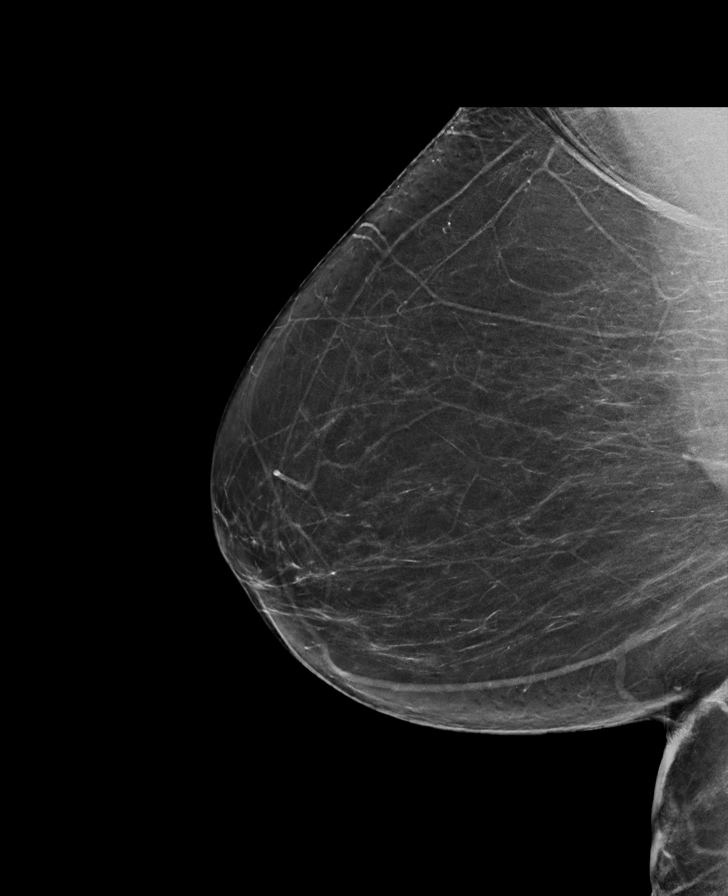

[L CC synth-2D]
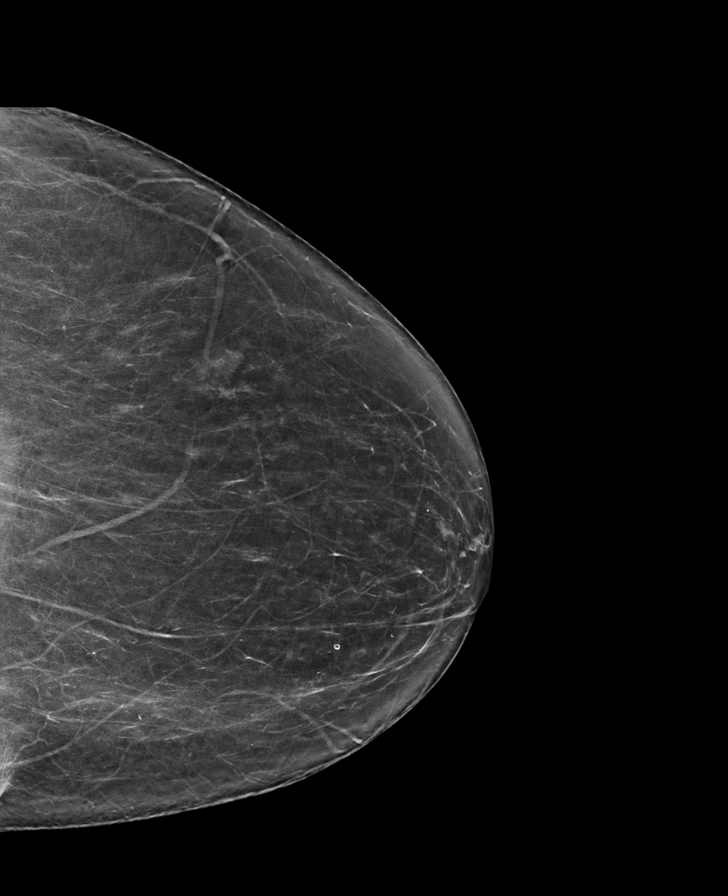

[L CC tomo · tomo slice 36/71.0]
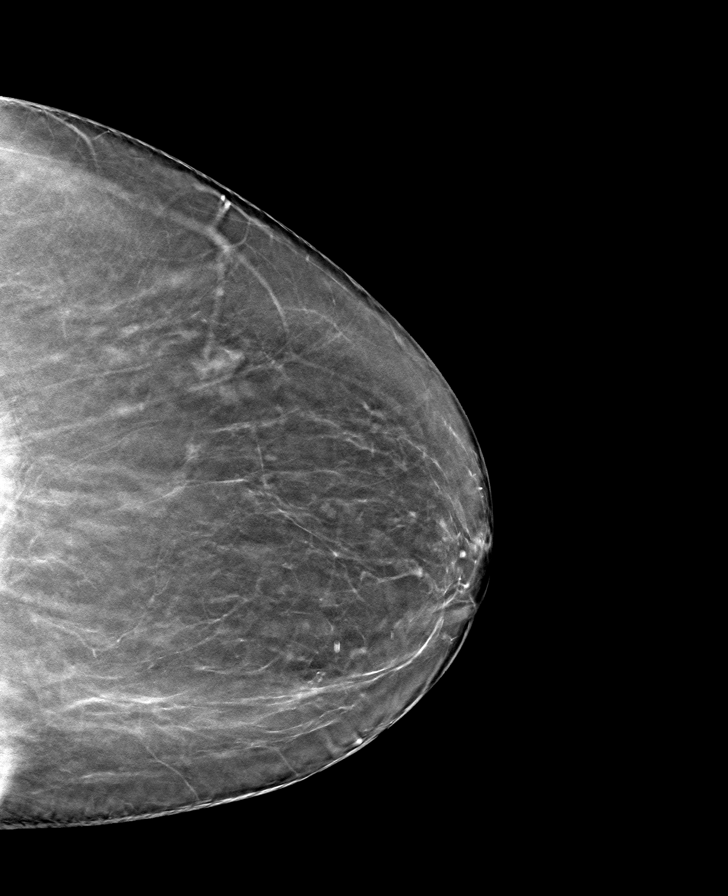

[L MLO tomo · tomo slice 42/83.0]
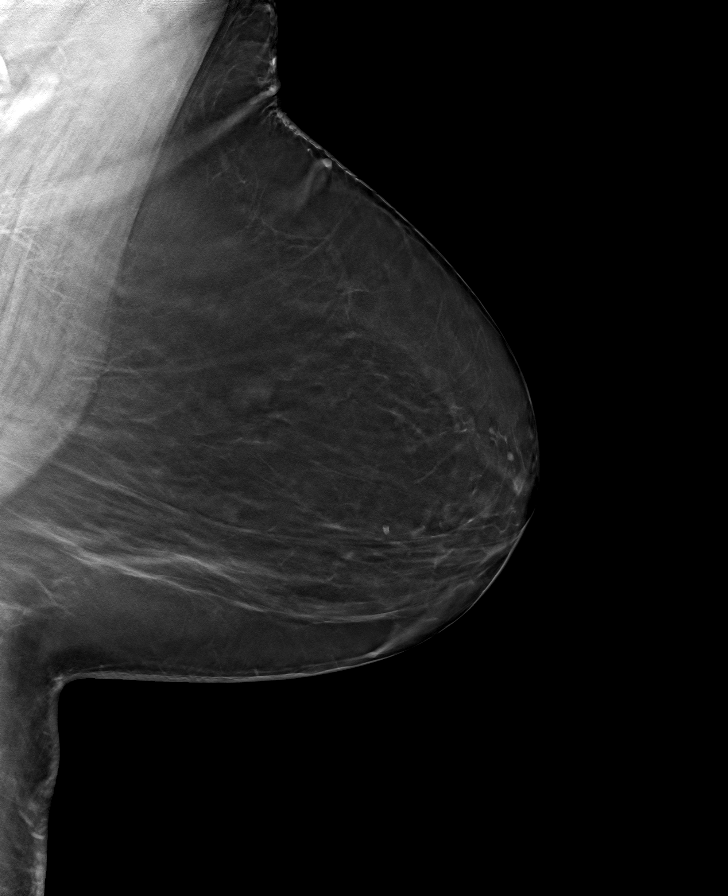

[R MLO tomo · tomo slice 37/73.0]
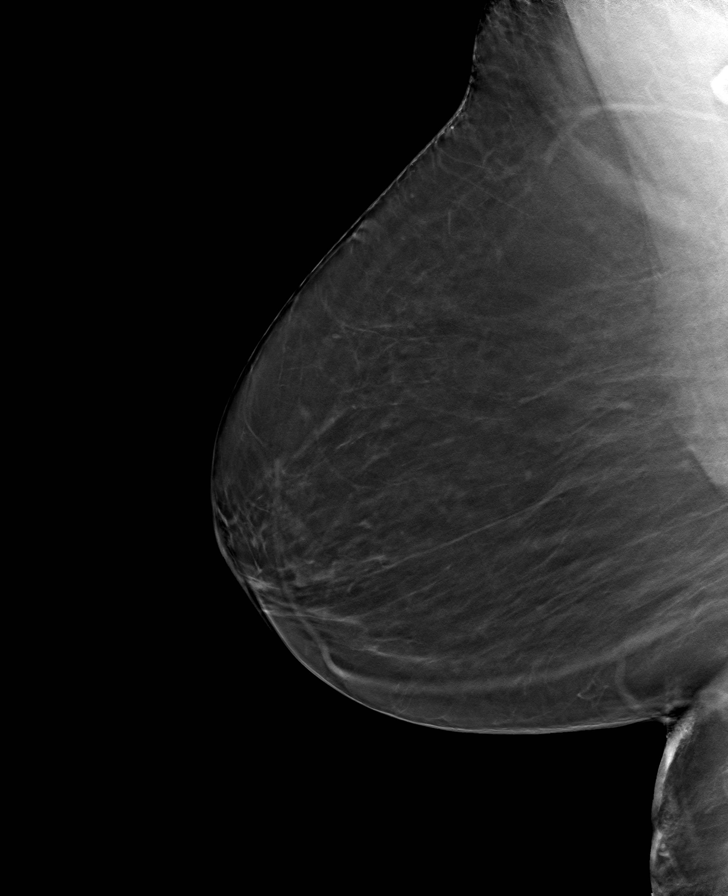

[R CC tomo · tomo slice 36/71.0]
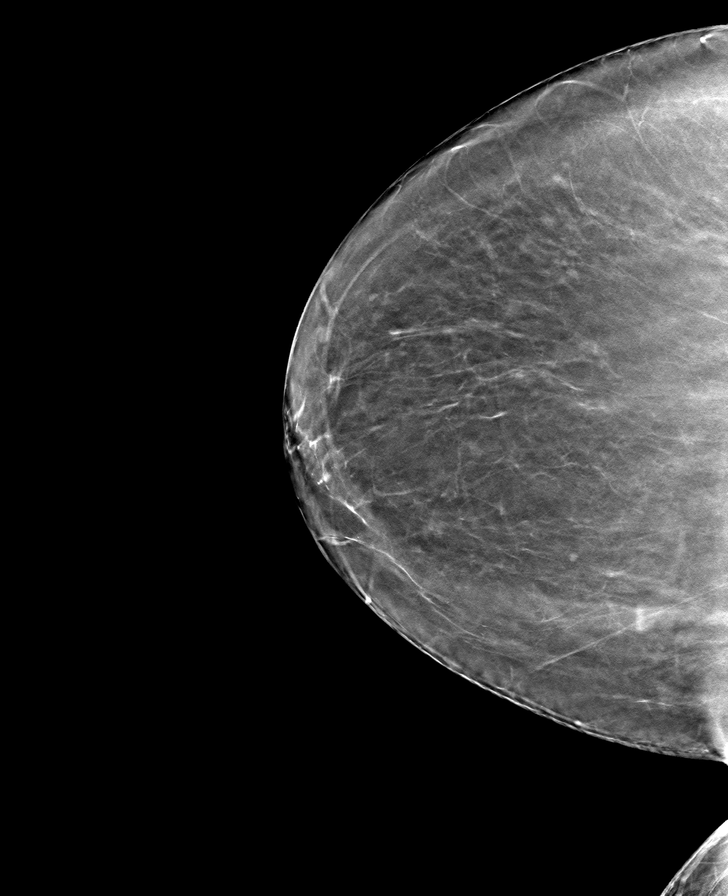

[8 of 24 positions shown; findings below may reference images not displayed]

FINDINGS: There are no findings suspicious for malignancy.
IMPRESSION: No mammographic evidence of malignancy. A result letter of this
screening mammogram will be mailed directly to the patient.

RECOMMENDATION:
Screening mammogram in one year. (Code:0E-3-N98)

BI-RADS CATEGORY  1: Negative.

## 2021-08-04 ENCOUNTER — Other Ambulatory Visit: Payer: Self-pay

## 2021-08-14 ENCOUNTER — Telehealth: Payer: Self-pay | Admitting: Licensed Clinical Social Worker

## 2021-08-14 NOTE — Patient Outreach (Signed)
  Medicaid Managed Care   Unsuccessful Attempt Note   08/14/2021 Name: [name] MRN: [id number] DOB: 1955/03/13  Referred by: [name], MD Reason for referral : Transitions Of Care   An unsuccessful telephone outreach was attempted today. The patient was referred to the case management team for assistance with care management and care coordination.    Follow Up Plan: A HIPAA compliant phone message was left for the patient providing contact information and requesting a return call.   Dickie La, BSW, MSW, [employer] Managed Medicaid LCSW [facility]  [facility] [email or web address] Phone: [phone]

## 2021-08-19 ENCOUNTER — Ambulatory Visit: Payer: Medicaid Other | Admitting: Critical Care Medicine

## 2021-08-21 ENCOUNTER — Ambulatory Visit (HOSPITAL_BASED_OUTPATIENT_CLINIC_OR_DEPARTMENT_OTHER): Payer: Medicaid Other | Admitting: Psychiatry

## 2021-08-21 ENCOUNTER — Other Ambulatory Visit: Payer: Self-pay

## 2021-08-21 ENCOUNTER — Ambulatory Visit: Payer: Medicaid Other | Admitting: Pharmacist

## 2021-08-21 DIAGNOSIS — F331 Major depressive disorder, recurrent, moderate: Secondary | ICD-10-CM | POA: Diagnosis not present

## 2021-08-21 DIAGNOSIS — F5101 Primary insomnia: Secondary | ICD-10-CM

## 2021-08-21 MED ORDER — MIRTAZAPINE 30 MG PO TABS
30.0000 mg | ORAL_TABLET | Freq: Every day | ORAL | 1 refills | Status: DC
  Filled 2021-08-21: qty 90, 90d supply, fill #0
  Filled 2021-09-17: qty 30, 30d supply, fill #0
  Filled 2021-12-18: qty 30, 30d supply, fill #1

## 2021-08-21 NOTE — Progress Notes (Signed)
BH MD/PA/NP OP Progress Note  08/21/2021 9:24 AM Meshia Rau  MRN:  [id number]  Chief Complaint:  Chief Complaint  Patient presents with   Follow-up   HPI: Ranita's daughter has moved back to [facility] from [place] and is staying with her best friend. Leetta is worried about her daughter. Julliette admits to this causing her depression. Kierre has been down since her daughter arrived. She had a mild seizure this past Sunday. Josselyne denies anhedonia. She is still socializing and going for walks. Her sleep is poor due to the stress. As a result her energy is poor. Her appetite is good. She is still praying and trying to stay positive. She denies  SI/HI. Jalani took a break from therapy in July but plans to restart next week.     Visit Diagnosis:    ICD-10-CM   1. Major depressive disorder, recurrent episode, moderate (HCC)  F33.1 mirtazapine (REMERON) 30 MG tablet    2. Primary insomnia  F51.01 mirtazapine (REMERON) 30 MG tablet      Past Psychiatric History: no updates  Past Medical History:  Past Medical History:  Diagnosis Date   Anxiety    Carpal tunnel syndrome    Diabetes mellitus without complication (HCC)    Diabetes mellitus, type II (HCC)    DYSTONIA 01/04/2007   Qualifier: Diagnosis of  By: Humberto Seals NP, Darl Pikes     Facial droop 10/09/2013   Hemorrhoid    Hypertension    Major depressive disorder, recurrent episode (HCC) 03/18/2006   Qualifier: Diagnosis of  By: Bradly Bienenstock     Obesity, unspecified 10/17/2007   Qualifier: Diagnosis of  By: Gerilyn Pilgrim PHD, Jeannie     Prediabetes 10/09/2013   Rectal bleeding 07/03/2015   Seizures (HCC)    Seizures (HCC) 08/24/2013    Past Surgical History:  Procedure Laterality Date   ABDOMINAL HYSTERECTOMY     COLONOSCOPY N/A 07/15/2015   Dr. Darrick Penna: small-mouth diverticula, small non-bleeding internal hemorrhoids, redundant colon    HERNIA REPAIR     MYOMECTOMY      Family Psychiatric  and Medical History:  Family History  Problem Relation Age  of Onset   Asthma Mother    Hypertension Father    Heart disease Father    Stroke Father    Cancer Sister        uterus cancer    Diabetes Maternal Uncle    Breast cancer Cousin    Colon cancer Neg Hx     Social History:  Social History   Socioeconomic History   Marital status: Divorced    Spouse name: Not on file   Number of children: 1   Years of education: Not on file   Highest education level: Not on file  Occupational History   Not on file  Tobacco Use   Smoking status: Never   Smokeless tobacco: Never  Vaping Use   Vaping Use: Never used  Substance and Sexual Activity   Alcohol use: No    Alcohol/week: 0.0 standard drinks of alcohol   Drug use: No   Sexual activity: Never  Other Topics Concern   Not on file  Social History Narrative   Right handed    Lives alone    Lives in apartment  on 6th floor    No caffeine   Social Determinants of Health   Financial Resource Strain: Low Risk  (06/27/2021)   Overall Financial Resource Strain (CARDIA)    Difficulty of Paying Living Expenses: Not hard at all  Food Insecurity: Food Insecurity Present (08/13/2020)   Hunger Vital Sign    Worried About Running Out of Food in the Last Year: Sometimes true    Ran Out of Food in the Last Year: Never true  Transportation Needs: Not on file  Physical Activity: Not on file  Stress: Not on file  Social Connections: Not on file    Allergies:  Allergies  Allergen Reactions   Codeine Anaphylaxis   Shellfish Allergy Swelling and Rash    Throat Sweling   Apple Cider Vinegar     syncope   Cinnamon Hives   Cozaar [Losartan]     Makes her feel like she is having a heart attack   Lisinopril     cough   Opium     Interferes with Keppra   Banana Rash    Cannot eat any while taking KEPPRA   Catfish [Fish Allergy] Rash   Orange Fruit [Citrus] Rash    Cannot have while taking KEPPRA   Tomato Itching and Rash    Metabolic Disorder Labs: Lab Results  Component Value Date    HGBA1C 6.0 (H) 01/29/2021   No results found for: "PROLACTIN" Lab Results  Component Value Date   CHOL 144 01/29/2021   TRIG 155 (H) 01/29/2021   HDL 49 01/29/2021   CHOLHDL 2.9 01/29/2021   VLDL 25 02/18/2016   LDLCALC 68 01/29/2021   LDLCALC 63 12/26/2019   Lab Results  Component Value Date   TSH 1.660 12/26/2019   TSH 2.510 05/02/2019    Therapeutic Level Labs: No results found for: "LITHIUM" No results found for: "VALPROATE" No results found for: "CBMZ"  Current Medications: Current Outpatient Medications  Medication Sig Dispense Refill   amLODipine (NORVASC) 10 MG tablet TAKE 1 TABLET (10 MG TOTAL) BY MOUTH DAILY. 90 tablet 0   Blood Pressure Monitor DEVI Use as directed to check home blood pressure 2-3 times a week 1 each 0   Blood Pressure Monitoring (BLOOD PRESSURE CUFF) MISC Use to check blood pressure once daily. 1 each 0   calcium carbonate (OS-CAL) 1250 (500 Ca) MG chewable tablet Chew 1 tablet by mouth daily.     carvedilol (COREG) 12.5 MG tablet Take 1 tablet (12.5 mg total) by mouth 2 (two) times daily with a meal. 60 tablet 3   hydrALAZINE (APRESOLINE) 50 MG tablet Take 1 tablet (50 mg total) by mouth 3 (three) times daily. 90 tablet 6   levETIRAcetam (KEPPRA XR) 500 MG 24 hr tablet Take 1 tablet by mouth three times a day 270 tablet 3   loratadine (CLARITIN) 10 MG tablet TAKE 1 TABLET (10 MG TOTAL) BY MOUTH DAILY. 60 tablet 2   metFORMIN (GLUCOPHAGE) 500 MG tablet TAKE 1 TABLET (500 MG TOTAL) BY MOUTH DAILY WITH BREAKFAST. 90 tablet 2   spironolactone (ALDACTONE) 25 MG tablet Take 1 tablet (25 mg total) by mouth daily. 60 tablet 2   tretinoin (RETIN-A) 0.05 % cream Apply topically at bedtime. 45 g 0   Vitamin D, Ergocalciferol, (DRISDOL) 1.25 MG (50000 UNIT) CAPS capsule Take 1 capsule by mouth by mouth once a week. 12 capsule 3   mirtazapine (REMERON) 30 MG tablet Take 1 tablet (30 mg total) by mouth at bedtime. 90 tablet 1   No current facility-administered  medications for this visit.     Musculoskeletal: Strength & Muscle Tone: within normal limits Gait & Station: normal Patient leans:  straight  Psychiatric Specialty Exam: Review of Systems  There were no  vitals taken for this visit.There is no height or weight on file to calculate BMI.  General Appearance: Casual and Neat  Eye Contact:  Good  Speech:  Clear and Coherent and Slow  Volume:  Normal  Mood:  Anxious and Depressed  Affect:  Appropriate and Full Range  Thought Process:  Coherent and Descriptions of Associations: Circumstantial  Orientation:  Full (Time, Place, and Person)  Thought Content: Rumination   Suicidal Thoughts:  No  Homicidal Thoughts:  No  Memory:  Immediate;   Good  Judgement:  Good  Insight:  Good  Psychomotor Activity:  Normal  Concentration:  Concentration: Good  Recall:  Good  Fund of Knowledge: Good  Language: Good  Akathisia:  No  Handed:  Right  AIMS (if indicated): not done  Assets:  Communication Skills Desire for Improvement Financial Resources/Insurance Housing Resilience Social Support Talents/Skills Transportation Vocational/Educational  ADL's:  Intact  Cognition: WNL  Sleep:  Good   Screenings: GAD-7    Advertising copywriter from 03/20/2020 in [facility] Office Visit from 12/26/2019 in [facility] Office Visit from 03/07/2019 in [facility] Office Visit from 12/26/2018 in [facility] Office Visit from 08/02/2018 in [facility]  Total GAD-7 Score 9 0 0 0 7      PHQ2-9    Flowsheet Row [facility] Office Visit from 06[facility]19/2021 in [facility] Office Visit from 04/29/2021 in [facility] Video Visit from [facility]/10[facility]2021 [facility] Video Visit from 01/09/2021 in [facility]  PHQ-2 Total Score 3 1 0 1 0  PHQ-9 Total Score 10 -- -- -- --      Flowsheet Row Office Visit from 08/21/2021 in [facility] Office Visit from 06[facility]19/2021 in [facility] Video Visit from 04/10/2021 in [facility]  C-SSRS RISK CATEGORY No Risk No Risk No Risk        Assessment and Plan:     Medication management with supportive therapy. Risks and benefits, side effects and alternative treatment options discussed with patient. Pt was given an opportunity to ask questions about medication, illness, and treatment. All current psychiatric medications have been reviewed and discussed with the patient and adjusted as clinically appropriate. The patient has been provided an accurate and updated list of the medications being now prescribed. Pt verbalized understanding and verbal consent obtained for treatment.   Status of current problems: situational depression due to daughter  Meds: continue Remeron 30mg  po qHS. She does not want the dose changed.    Labs: none  Therapy: brief supportive therapy provided. Discussed psychosocial stressors in detail.      F/up in 3-4 months or sooner if needed  The duration of this appointment visit was 20 minutes of face-to-face time with the patient.  Greater than 50% of this time was spent in counseling, explanation of  diagnosis, planning of further management, and coordination of care    Collaboration of Care: Collaboration of Care: Other none  Patient/Guardian was advised Release of Information must be obtained prior to any record release in order to collaborate their care with an outside provider. Patient/Guardian was advised if they have not already done so to contact the registration department to sign all necessary forms in  order  for [place] to release information regarding their care.   Consent: Patient/Guardian gives verbal consent for treatment and assignment of benefits for services provided during this visit. Patient/Guardian expressed understanding and agreed to proceed.    Oletta Darter, MD 08/21/2021, 9:24 AM

## 2021-08-25 ENCOUNTER — Encounter (HOSPITAL_COMMUNITY): Payer: Self-pay | Admitting: Licensed Clinical Social Worker

## 2021-08-25 ENCOUNTER — Ambulatory Visit (INDEPENDENT_AMBULATORY_CARE_PROVIDER_SITE_OTHER): Payer: Medicaid Other | Admitting: Licensed Clinical Social Worker

## 2021-08-25 DIAGNOSIS — F331 Major depressive disorder, recurrent, moderate: Secondary | ICD-10-CM

## 2021-08-25 NOTE — Progress Notes (Signed)
Virtual Visit via Telephone Note  I connected with [name] on 08/25/2021 at 10:00 am by phone enabled telehealth application and verified that I am speaking withthe correct person using two identifiers. Patient does not have a computer or Internet.   I discussed the limitations of evaluation and management by telemedicine and the availability of in person appointments. The patient expressed understanding and agreed to proceed.  LOCATION:  Patient: Home Provider: Home Office  Treatment Goal Addressed: Develop the ability to recognize, accept, and cope with feelings of depression AEB self report.  Progress Towards Goal: Progressing   History of Present Illness: Pt was referred for OP therapy by her neurologist Dr. Terra Alta Lions for depression.   Observations/Objective: Patient presented for today's session on time and was alert, oriented x5, with no evidence or self-report of SI/HI or A/V H.  Patient reported ongoing compliance with medication and denied any use of alcohol or illicit substances. Clinician inquired about patient's current emotional ratings, as well as any significant changes in thoughts, feelings or behavior since previous session.  Patient reported emotional ratings of 5/10 for depression, 6/10 for anxiety, 2/10 for anger/irritability.  Cln and pt reviewed emotional ratings and coping skills. Pt has been out of therapy for 30 days, "I was just so stressed out and needed a break. My daughter and her 3 children came to [facility]. They just showed up at my apt." Cln asked open-ended questions. "I had 1 dizzy spell a week ago."Clinician utilized CBT to process concerns and challenges. Clinician processed thoughts, feelings, and behaviors. Clinician discussed coping skills and options for supporting herself through these challenging times. Pt  provided update on health concerns, and stress. "Since my daughter and her children came and stayed for a while my depressive symptoms increased, but since  they've been gone my depressive symptoms have decreased." Cln provided education on the cycle of depression.     Collaboration of Care: Other: Continue to see Dr. Michae Kava and Dr.  Lions.  Patient/Guardian was advised Release of Information must be obtained prior to any record release in order to collaborate their care with an outside provider. Patient/Guardian was advised if they have not already done so to contact the registration department to sign all necessary forms in order for [place] to release information regarding their care.   Consent: Patient/Guardian gives verbal consent for treatment and assignment of benefits for services provided during this visit. Patient/Guardian expressed understanding and agreed to proceed.      Assessment and plan: Counselor will continue to meet with patient to address treatment plan goals. Patient will continue to follow recommendations of providers and implement skills learned in session.  Follow Up Instructions:   I discussed the assessment and treatment plan with the patient. The patient was provided an opportunity to ask questions and all were answered. The patient agreed with the plan and demonstrated an understanding of the instructions.   The patient was advised to call back or seek an in-person evaluation if the symptoms worsen or if the condition fails to improve as anticipated.  I provided 60 minutes of non-face-to-face time during this encounter.   Arsema Tusing S, LCAS

## 2021-08-26 ENCOUNTER — Telehealth: Payer: Self-pay | Admitting: Pharmacist

## 2021-08-26 ENCOUNTER — Ambulatory Visit: Payer: Self-pay

## 2021-08-26 NOTE — Telephone Encounter (Signed)
Contacted patient for scheduled hypertension follow up call. Left voicemail for patient to return my call at her convenience.   Catie Eppie Gibson, PharmD, [facility] [phone]

## 2021-09-01 ENCOUNTER — Ambulatory Visit (HOSPITAL_COMMUNITY): Payer: Medicaid Other | Admitting: Licensed Clinical Social Worker

## 2021-09-03 ENCOUNTER — Other Ambulatory Visit: Payer: Self-pay | Admitting: Pharmacist

## 2021-09-03 NOTE — Chronic Care Management (AMB) (Signed)
Chief Complaint  Patient presents with   Hypertension    [name] is a 66 y.o. year old female who presented for a telephone visit.   They were referred to the pharmacist by a quality report for assistance in managing hypertension.   Patient is participating in a Managed Medicaid Plan:  Yes  Subjective:  Care Team: Primary Care Provider: [name], MD ; Next Scheduled Visit: 10/21/21, but embedded pharmacist on 09/24/21 Neurologist: Karel Jarvis; Next Scheduled Visit: 07/07/22 Psychiatrist: Michae Kava; Next Scheduled Visit: 12/25/21  Medication Access/Adherence  Current Pharmacy:  [facility] [address], [address] [place] [address] Phone: [phone] Fax: [phone]   Patient reports affordability concerns with their medications: No  Patient reports access/transportation concerns to their pharmacy: No  Patient reports adherence concerns with their medications:  No     Hypertension:  Current medications: spironolactone 25 mg daily, amlodipine 10 mg daily, carvedilol 12.5 mg twice daily, hydralazine 50 mg three times daily Medications previously tried:   Patient has a validated, automated, upper arm home BP cuff but admits she has not been checking her blood pressure regularly.    Patient denies hypotensive s/sx including dizziness, lightheadedness.  Patient denies hypertensive symptoms including headache, chest pain, shortness of breath  Reports a stressful occurrence at the dentist yesterday, but feeling better today. Continues to organize her medications well.    Health Maintenance  Health Maintenance Due  Topic Date Due   INFLUENZA VACCINE  08/19/2021     Objective: Lab Results  Component Value Date   HGBA1C 6.0 (H) 01/29/2021    Lab Results  Component Value Date   CREATININE 0.87 06/10/2021   BUN 5 (L) 06/10/2021   NA 141 06/10/2021   K 4.9 06/10/2021   CL 103 06/10/2021   CO2 26  06/10/2021    Lab Results  Component Value Date   CHOL 144 01/29/2021   HDL 49 01/29/2021   LDLCALC 68 01/29/2021   TRIG 155 (H) 01/29/2021   CHOLHDL 2.9 01/29/2021    Medications Reviewed Today     Reviewed by Alden Hipp, RPH-CPP (Pharmacist) on 09/03/21 at [phone]  Med List Status: <None>   Medication Order Taking? Sig Documenting Provider Last Dose Status Informant  amLODipine (NORVASC) 10 MG tablet [id number] Yes TAKE 1 TABLET (10 MG TOTAL) BY MOUTH DAILY. Marcine Matar, MD Taking Active   Blood Pressure Monitor DEVI [id number] Yes Use as directed to check home blood pressure 2-3 times a week [name], MD Taking Active   Blood Pressure Monitoring (BLOOD PRESSURE CUFF) MISC [id number] Yes Use to check blood pressure once daily. Marcine Matar, MD Taking Active   calcium carbonate (OS-CAL) 1250 (500 Ca) MG chewable tablet [id number]  Chew 1 tablet by mouth daily. [provider]  Active   carvedilol (COREG) 12.5 MG tablet [id number] Yes Take 1 tablet (12.5 mg total) by mouth 2 (two) times daily with a meal. [name], MD Taking Active            Med Note Clearance Coots, Meela Wareing T   Fri Jul 11, 2021  9:39 AM)    hydrALAZINE (APRESOLINE) 50 MG tablet [id number] Yes Take 1 tablet (50 mg total) by mouth 3 (three) times daily. [name], MD Taking Active            Med Note Clearance Coots, Janace Litten   Fri Jul 11, 2021  9:39 AM)  levETIRAcetam (KEPPRA XR) 500 MG 24 hr tablet [id number] Yes Take 1 tablet by mouth three times a day [name], MD Taking Active            Med Note Clearance Coots, Janace Litten   Fri Jul 11, 2021  9:39 AM)    loratadine (CLARITIN) 10 MG tablet [id number]  TAKE 1 TABLET (10 MG TOTAL) BY MOUTH DAILY. [name], MD  Active   metFORMIN (GLUCOPHAGE) 500 MG tablet [id number] Yes TAKE 1 TABLET (500 MG TOTAL) BY MOUTH DAILY WITH BREAKFAST. [name], MD Taking Active   mirtazapine (REMERON) 30 MG tablet [id number] Yes Take  1 tablet (30 mg total) by mouth at bedtime. Oletta Darter, MD Taking Active   spironolactone (ALDACTONE) 25 MG tablet [id number] Yes Take 1 tablet (25 mg total) by mouth daily. [name], MD Taking Active            Med Note Clearance Coots, Tziporah Knoke T   Fri Jul 11, 2021  9:39 AM)    tretinoin (RETIN-A) 0.05 % cream [id number]  Apply topically at bedtime. [name], MD  Active   Vitamin D, Ergocalciferol, (DRISDOL) 1.25 MG (50000 UNIT) CAPS capsule [id number] No Take 1 capsule by mouth by mouth once a week.  Patient not taking: Reported on 09/03/2021   [name], MD Not Taking Active               Assessment/Plan:   Hypertension: - Currently uncontrolled but improved - Reviewed appropriate blood pressure monitoring technique and reviewed goal blood pressure. Recommended to check home blood pressure and heart rate daily - Recommend to continue current regimen but restart checking blood pressures     Follow Up Plan: phone call in 2 weeks  Catie Eppie Gibson, PharmD, [facility] [phone]

## 2021-09-08 ENCOUNTER — Ambulatory Visit (INDEPENDENT_AMBULATORY_CARE_PROVIDER_SITE_OTHER): Payer: Medicaid Other | Admitting: Licensed Clinical Social Worker

## 2021-09-08 ENCOUNTER — Encounter (HOSPITAL_COMMUNITY): Payer: Self-pay | Admitting: Licensed Clinical Social Worker

## 2021-09-08 DIAGNOSIS — F331 Major depressive disorder, recurrent, moderate: Secondary | ICD-10-CM | POA: Diagnosis not present

## 2021-09-08 NOTE — Progress Notes (Signed)
Virtual Visit via Telephone Note  I connected with [name] on 09/08/2021 at 10:00 am by phone enabled telehealth application and verified that I am speaking withthe correct person using two identifiers. Patient does not have a computer or Internet.   I discussed the limitations of evaluation and management by telemedicine and the availability of in person appointments. The patient expressed understanding and agreed to proceed.  LOCATION:  Patient: Home Provider: Home Office  Treatment Goal Addressed: Develop the ability to recognize, accept, and cope with feelings of depression AEB self report.  Progress Towards Goal: Progressing   History of Present Illness: Pt was referred for OP therapy by her neurologist Dr. Jewett Lions for depression.   Observations/Objective: Patient presented for today's session on time and was alert, oriented x5, with no evidence or self-report of SI/HI or A/V H.  Patient reported ongoing compliance with medication and denied any use of alcohol or illicit substances. Clinician inquired about patient's current emotional ratings, as well as any significant changes in thoughts, feelings or behavior since previous session.  Patient reported emotional ratings of 5/10 for depression, 6/10 for anxiety, 2/10 for anger/irritability.  Cln and pt reviewed emotional ratings and coping skills.  "I continue to be stressed out but I'm having limited conversations with my daughter and her family."I had 0 dizzy spells within the last week. "Clinician utilized CBT to process concerns and challenges. Clinician processed thoughts, feelings, and behaviors. Clinician discussed coping skills and options for supporting herself through these challenging times. Pt  provided update on health concerns, and stress. Pt began a discussion about her desire to move to [facility] with her sister and brother-in-law. Cln and pt explored goals and strategies for patient to complete her  goal.    Collaboration of Care: Other: Continue to see Dr. Michae Kava and Dr. Ulm Lions.  Patient/Guardian was advised Release of Information must be obtained prior to any record release in order to collaborate their care with an outside provider. Patient/Guardian was advised if they have not already done so to contact the registration department to sign all necessary forms in order for [place] to release information regarding their care.   Consent: Patient/Guardian gives verbal consent for treatment and assignment of benefits for services provided during this visit. Patient/Guardian expressed understanding and agreed to proceed.      Assessment and plan: Counselor will continue to meet with patient to address treatment plan goals. Patient will continue to follow recommendations of providers and implement skills learned in session.  Follow Up Instructions:   I discussed the assessment and treatment plan with the patient. The patient was provided an opportunity to ask questions and all were answered. The patient agreed with the plan and demonstrated an understanding of the instructions.   The patient was advised to call back or seek an in-person evaluation if the symptoms worsen or if the condition fails to improve as anticipated.  I provided 45 minutes of non-face-to-face time during this encounter.   Trypp Heckmann S, LCAS

## 2021-09-15 ENCOUNTER — Ambulatory Visit (INDEPENDENT_AMBULATORY_CARE_PROVIDER_SITE_OTHER): Payer: Medicaid Other | Admitting: Licensed Clinical Social Worker

## 2021-09-15 DIAGNOSIS — F331 Major depressive disorder, recurrent, moderate: Secondary | ICD-10-CM | POA: Diagnosis not present

## 2021-09-16 ENCOUNTER — Ambulatory Visit (INDEPENDENT_AMBULATORY_CARE_PROVIDER_SITE_OTHER): Payer: Medicaid Other | Admitting: Podiatry

## 2021-09-16 DIAGNOSIS — M79675 Pain in left toe(s): Secondary | ICD-10-CM | POA: Diagnosis not present

## 2021-09-16 DIAGNOSIS — B351 Tinea unguium: Secondary | ICD-10-CM

## 2021-09-16 DIAGNOSIS — M79674 Pain in right toe(s): Secondary | ICD-10-CM

## 2021-09-16 DIAGNOSIS — E119 Type 2 diabetes mellitus without complications: Secondary | ICD-10-CM

## 2021-09-17 ENCOUNTER — Other Ambulatory Visit: Payer: Self-pay

## 2021-09-17 ENCOUNTER — Ambulatory Visit: Payer: Medicaid Other

## 2021-09-17 ENCOUNTER — Other Ambulatory Visit: Payer: Medicaid Other | Admitting: Pharmacist

## 2021-09-17 NOTE — Progress Notes (Signed)
Subjective: 66 year old female presents the office with concerns of thick, discolored toenails that she cannot trim herself and they are causing discomfort as well as for calluses.  She has no new concerns today.    Marcine Matar, MD  Objective: AAO x3, NAD DP/PT pulses palpable bilaterally, CRT less than 3 seconds Nails are hypertrophic, dystrophic, brittle, discolored, elongated 10. No surrounding redness or drainage. Tenderness nails 1-5 bilaterally. Minimal hyperkeratotic lesion present right submetatarsal 1 as well as bilateral medial rear foot and medial hallux bilaterally.  There is no underlying ulceration drainage or any signs of infection. There is a annular hyper pigmented lesion present on the right third toe consistent with a mole.  Unchanged. No pain with calf compression, swelling, warmth, erythema  Assessment: Type 2 diabetes with neuropathy, symptomatic onychomycosis/hyperkeratotic lesions  Plan: -All treatment options discussed with the patient including all alternatives, risks, complications.  -Sharply debrided nails x10 without any complications or bleeding -Lightly debrided the hyperkeratotic lesion sharply debrided without any complications or bleeding as a courtesy as it was minimal in no significant callus today.  Continue moisturizer and offloading  -Discussed daily foot inspection -Patient encouraged to call the office with any questions, concerns, change in symptoms.   Vivi Barrack DPM

## 2021-09-18 ENCOUNTER — Other Ambulatory Visit: Payer: Self-pay

## 2021-09-23 ENCOUNTER — Ambulatory Visit (INDEPENDENT_AMBULATORY_CARE_PROVIDER_SITE_OTHER): Payer: Medicaid Other | Admitting: Licensed Clinical Social Worker

## 2021-09-23 DIAGNOSIS — F331 Major depressive disorder, recurrent, moderate: Secondary | ICD-10-CM | POA: Diagnosis not present

## 2021-09-24 ENCOUNTER — Other Ambulatory Visit: Payer: Medicaid Other | Admitting: Pharmacist

## 2021-09-24 ENCOUNTER — Other Ambulatory Visit: Payer: Self-pay

## 2021-09-24 ENCOUNTER — Other Ambulatory Visit: Payer: Self-pay | Admitting: Pharmacist

## 2021-09-24 DIAGNOSIS — I10 Essential (primary) hypertension: Secondary | ICD-10-CM

## 2021-09-24 MED ORDER — AMLODIPINE BESYLATE 10 MG PO TABS
ORAL_TABLET | Freq: Every day | ORAL | 0 refills | Status: DC
  Filled 2021-09-24: qty 90, 90d supply, fill #0

## 2021-09-24 NOTE — Progress Notes (Signed)
S:    PCP: Dr.Wright  [name] is a 66 y.o. female who presents for hypertension evaluation, education, and management. PMH is significant for HTN, psoriasis, depression, anxiety, prediabetes, insomnia, obesity, and seizures.   Patient has been seen by CPP Georgiana Shore Ausdall for hypertensive management in the past with the last visit on 10/22/20. Patient had been at goal with BP management at that time and the previous CPP visit and was told to follow back up with PCP. Most recently, patient was referred and last seen by Primary Care Provider, Dr. Delford Field, on 5/23. Patient reported she had been off of her medications for a while at that visit. BP at that visit was 209/112. HCTZ was stopped during this visit. Carvedilol and spironolactone were added.   On 9/6, patient spoke with CPP Nilda Simmer and endorsed lifestyle changes. She stated she had not been taking her BP at home as she was out of batteries for the cuff. Today, patient arrives in good spirits and is ambulating with a cane. Endorses dizzy spells frequently, although likely attributable to her epilepsy diagnosis. Denies headache, blurred vision, swelling. She reported that she was very stressed with family life events during her last visit with Dr. Delford Field in May, attributing her high BP reading to this.   Family/Social history:  - FHx: asthma, HTN, heart disease, stroke  - Tobacco: never smoker  - Alcohol: denies use   Medication adherence appears optimal. Patient has taken her morning BP medications today (hydralazine, spironolactone, and carvedilol)  Current antihypertensives include: amlodipine 10mg  once daily at bedtime, carvedilol 12.5mg  BID, hydralazine 50mg  TID, spironolactone 25mg  once daily   Antihypertensives tried in the past include: lisinopril (cough), losartan (feels that she is having a heart attack)  Reported home BP readings: not currently taking her BP at home. She plans to go get batteries today for her  cuff.  O:  Vitals:   09/25/21 1101  BP: 138/83  Pulse: 69    Last 3 Office BP readings: BP Readings from Last 3 Encounters:  07/11/21 (!) 136/102  07/01/21 (!) 142/84  06/27/21 (!) 156/96    BMET    Component Value Date/Time   NA 141 06/10/2021 0914   K 4.9 06/10/2021 0914   CL 103 06/10/2021 0914   CO2 26 06/10/2021 0914   GLUCOSE 96 06/10/2021 0914   GLUCOSE 86 02/18/2016 0948   BUN 5 (L) 06/10/2021 0914   CREATININE 0.87 06/10/2021 0914   CREATININE 0.89 02/18/2016 0948   CALCIUM 9.7 06/10/2021 0914   GFRNONAA 60 12/26/2019 0949   GFRNONAA 73 08/17/2014 1013   GFRAA 69 12/26/2019 0949   GFRAA 84 08/17/2014 1013    Clinical ASCVD: No  The 10-year ASCVD risk score (Arnett DK, et al., 2019) is: 17.5%   Values used to calculate the score:     Age: 22 years     Sex: Female     Is Non-Hispanic African American: Yes     Diabetic: Yes     Tobacco smoker: No     Systolic Blood Pressure: 136 mmHg     Is BP treated: Yes     HDL Cholesterol: 49 mg/dL     Total Cholesterol: 144 mg/dL  Patient is participating in a Managed Medicaid Plan:  Yes    A/P: Hypertension longstanding currently close to goal on current medications. BP goal < 130/80 mmHg. Medication adherence appears optimal. Given her frequency of dizzy spells, BP close to goal, and that she does  not have a working BP cuff at this time, no changes were made to her home regimen.  -Continued amlodipine 10mg  once daily at bedtime -Continued carvedilol 12.5mg  BID -Continued hydralazine 50mg  TID -Continued spironolactone 25mg  once daily.  -Encouraged her to take her BP at home, especially when she experiences dizziness and bring a log of the readings to her next visit.  -F/u labs ordered - Hemglobin A1c and CMP -Counseled on lifestyle modifications for blood pressure control including reduced dietary sodium, increased exercise, adequate sleep.   Results reviewed and written information provided.    Written  patient instructions provided. Patient verbalized understanding of treatment plan.  Total time in face to face counseling 30 minutes.    Follow-up:  PCP clinic visit in October.   , PharmD PGY-1 [facility] Pharm[facility] Resident

## 2021-09-24 NOTE — Progress Notes (Signed)
Chief Complaint  Patient presents with   Hypertension    [name] is a 66 y.o. year old female who presented for a telephone visit.   They were referred to the pharmacist by a quality report for assistance in managing hypertension.   Patient is participating in a Managed Medicaid Plan:  Yes  Subjective:  Care Team: Primary Care Provider: [name], MD ; Next Scheduled Visit: 10/21/21; embedded pharmacist tomorrow  Medication Access/Adherence  Current Pharmacy:  [facility] [address], [address] [place] [address] Phone: [phone] Fax: [phone]   Patient reports affordability concerns with their medications: No  Patient reports access/transportation concerns to their pharmacy: No  Patient reports adherence concerns with their medications:  No     Hypertension:  Current medications: amlodipine 10 mg daily, carvedilol 12.5 mg twice daily, hydralazine 50 mg three times daily, spironolactone 25 mg daily  Reports that she needs a refill for amlodipine sent to [facility] pharmacy  Confirms adherence to her medications lately  Patient has a validated, automated, upper arm home BP cuff, though she reports that she needs to get new batteries. She plans to get batteries when she is out tomorrow.   Patient denies hypotensive s/sx including dizziness, lightheadedness.  Patient denies hypertensive symptoms including headache, chest pain, shortness of breath  Current physical activity: reports she has been walking most days lately   Health Maintenance  Health Maintenance Due  Topic Date Due   INFLUENZA VACCINE  08/19/2021     Objective: Lab Results  Component Value Date   HGBA1C 6.0 (H) 01/29/2021    Lab Results  Component Value Date   CREATININE 0.87 06/10/2021   BUN 5 (L) 06/10/2021   NA 141 06/10/2021   K 4.9 06/10/2021   CL 103 06/10/2021   CO2 26 06/10/2021    Lab Results   Component Value Date   CHOL 144 01/29/2021   HDL 49 01/29/2021   LDLCALC 68 01/29/2021   TRIG 155 (H) 01/29/2021   CHOLHDL 2.9 01/29/2021    Medications Reviewed Today     Reviewed by Alden Hipp, RPH-CPP (Pharmacist) on 09/24/21 at 1017  Med List Status: <None>   Medication Order Taking? Sig Documenting Provider Last Dose Status Informant  amLODipine (NORVASC) 10 MG tablet [id number] Yes TAKE 1 TABLET (10 MG TOTAL) BY MOUTH DAILY. Marcine Matar, MD Taking Active   Blood Pressure Monitor DEVI [id number]  Use as directed to check home blood pressure 2-3 times a week [name], MD  Active   Blood Pressure Monitoring (BLOOD PRESSURE CUFF) MISC [id number]  Use to check blood pressure once daily. Marcine Matar, MD  Active   calcium carbonate (OS-CAL) 1250 (500 Ca) MG chewable tablet [id number]  Chew 1 tablet by mouth daily. [provider]  Active   carvedilol (COREG) 12.5 MG tablet [id number] Yes Take 1 tablet (12.5 mg total) by mouth 2 (two) times daily with a meal. [name], MD Taking Active            Med Note Clearance Coots, Hanae Waiters T   Fri Jul 11, 2021  9:39 AM)    hydrALAZINE (APRESOLINE) 50 MG tablet [id number] Yes Take 1 tablet (50 mg total) by mouth 3 (three) times daily. [name], MD Taking Active            Med Note Clearance Coots, Janace Litten   Fri Jul 11, 2021  9:39 AM)  levETIRAcetam (KEPPRA XR) 500 MG 24 hr tablet [id number] Yes Take 1 tablet by mouth three times a day [name], MD Taking Active            Med Note Clearance Coots, Janace Litten   Fri Jul 11, 2021  9:39 AM)    loratadine (CLARITIN) 10 MG tablet [id number] Yes TAKE 1 TABLET (10 MG TOTAL) BY MOUTH DAILY. [name], MD Taking Active   metFORMIN (GLUCOPHAGE) 500 MG tablet [id number] Yes TAKE 1 TABLET (500 MG TOTAL) BY MOUTH DAILY WITH BREAKFAST. [name], MD Taking Active   mirtazapine (REMERON) 30 MG tablet [id number] Yes Take 1 tablet (30 mg total) by mouth at  bedtime. Oletta Darter, MD Taking Active   spironolactone (ALDACTONE) 25 MG tablet [id number] Yes Take 1 tablet (25 mg total) by mouth daily. [name], MD Taking Active            Med Note Clearance Coots, Ryli Standlee T   Fri Jul 11, 2021  9:39 AM)    tretinoin (RETIN-A) 0.05 % cream [id number]  Apply topically at bedtime. [name], MD  Active   Vitamin D, Ergocalciferol, (DRISDOL) 1.25 MG (50000 UNIT) CAPS capsule [id number]  Take 1 capsule by mouth by mouth once a week.  Patient not taking: Reported on 09/03/2021   [name], MD  Active             Assessment/Plan:   Hypertension: - Currently uncontrolled but improving - Reviewed medication adherence. Will collaborate with embedded pharmacist to send amlodipine script to [facility]. - Recommend to continue current regimen at this time - Praised for focus on lifestyle modification    Follow Up Plan: will follow BP tomorrow  Catie Eppie Gibson, PharmD, [facility] [phone]

## 2021-09-25 ENCOUNTER — Ambulatory Visit: Payer: Medicaid Other | Attending: Critical Care Medicine | Admitting: Pharmacist

## 2021-09-25 VITALS — BP 138/83 | HR 69 | Wt 204.8 lb

## 2021-09-25 DIAGNOSIS — R7303 Prediabetes: Secondary | ICD-10-CM | POA: Diagnosis not present

## 2021-09-25 DIAGNOSIS — Z6838 Body mass index (BMI) 38.0-38.9, adult: Secondary | ICD-10-CM | POA: Diagnosis not present

## 2021-09-25 DIAGNOSIS — I10 Essential (primary) hypertension: Secondary | ICD-10-CM | POA: Diagnosis not present

## 2021-09-26 LAB — CMP14+EGFR
ALT: 14 IU/L (ref 0–32)
AST: 18 IU/L (ref 0–40)
Albumin/Globulin Ratio: 1.7 (ref 1.2–2.2)
Albumin: 4.5 g/dL (ref 3.9–4.9)
Alkaline Phosphatase: 99 IU/L (ref 44–121)
BUN/Creatinine Ratio: 17 (ref 12–28)
BUN: 15 mg/dL (ref 8–27)
Bilirubin Total: 0.2 mg/dL (ref 0.0–1.2)
CO2: 22 mmol/L (ref 20–29)
Calcium: 9.8 mg/dL (ref 8.7–10.3)
Chloride: 106 mmol/L (ref 96–106)
Creatinine, Ser: 0.88 mg/dL (ref 0.57–1.00)
Globulin, Total: 2.7 g/dL (ref 1.5–4.5)
Glucose: 109 mg/dL — ABNORMAL HIGH (ref 70–99)
Potassium: 4.4 mmol/L (ref 3.5–5.2)
Sodium: 143 mmol/L (ref 134–144)
Total Protein: 7.2 g/dL (ref 6.0–8.5)
eGFR: 73 mL/min/{1.73_m2} (ref >59)

## 2021-09-26 LAB — HEMOGLOBIN A1C
Est. average glucose Bld gHb Est-mCnc: 126 mg/dL
Hgb A1c MFr Bld: 6 % — ABNORMAL HIGH (ref 4.8–5.6)

## 2021-09-26 NOTE — Progress Notes (Signed)
Luke pls call pt with lab results I agree with your note from yesterday and no changes in bp meds   Kidney liver look good   A1C is good

## 2021-09-28 ENCOUNTER — Encounter (HOSPITAL_COMMUNITY): Payer: Self-pay | Admitting: Licensed Clinical Social Worker

## 2021-09-29 ENCOUNTER — Ambulatory Visit (INDEPENDENT_AMBULATORY_CARE_PROVIDER_SITE_OTHER): Payer: Medicaid Other | Admitting: Licensed Clinical Social Worker

## 2021-09-29 ENCOUNTER — Encounter (HOSPITAL_COMMUNITY): Payer: Self-pay | Admitting: Licensed Clinical Social Worker

## 2021-09-29 DIAGNOSIS — F331 Major depressive disorder, recurrent, moderate: Secondary | ICD-10-CM

## 2021-09-29 NOTE — Progress Notes (Signed)
Virtual Visit via Telephone Note  I connected with [name] on 09/29/2021 at 10:00 am by phone enabled telehealth application and verified that I am speaking withthe correct person using two identifiers. Patient does not have a computer or Internet.   I discussed the limitations of evaluation and management by telemedicine and the availability of in person appointments. The patient expressed understanding and agreed to proceed.  LOCATION:  Patient: Home Provider: Home Office  Treatment Goal Addressed: Develop the ability to recognize, accept, and cope with feelings of depression AEB self report.  Progress Towards Goal: Progressing   History of Present Illness: Pt was referred for OP therapy by her neurologist Dr. Britton Lions for depression.   Observations/Objective: Patient presented for today's session on time and was alert, oriented x5, with no evidence or self-report of SI/HI or A/V H.  Patient reported ongoing compliance with medication and denied any use of alcohol or illicit substances. Clinician inquired about patient's current emotional ratings, as well as any significant changes in thoughts, feelings or behavior since previous session.  Patient reported emotional ratings of 5/10 for depression, 6/10 for anxiety, 2/10 for anger/irritability.  Cln and pt reviewed emotional ratings and coping skills.  "I continue to be stressed with a new stressor: one of my brothers has a crack cocaine problem. He's been in rehab, but got kicked out for missing curfew 2x. He's also on probation so he may go to jail." Cln asked open-ended questions about how this is affecting patient. "I'm just as stressed but it's a new stressor." Cln provided education on the cycle of depression. "I had no spells within the last week." Clinician utilized CBT to process concerns and challenges. Clinician processed thoughts, feelings, and behaviors. Clinician discussed coping skills and options for supporting herself  through these challenging times.  Pt continued discussion about her desire to move to [facility] with her sister and brother-in-law. Again, Cln and pt explored goals and strategies for patient to complete her goal.    Collaboration of Care: Other: Continue to see Dr. Michae Kava and Dr. Ridgely Lions.  Patient/Guardian was advised Release of Information must be obtained prior to any record release in order to collaborate their care with an outside provider. Patient/Guardian was advised if they have not already done so to contact the registration department to sign all necessary forms in order for [place] to release information regarding their care.   Consent: Patient/Guardian gives verbal consent for treatment and assignment of benefits for services provided during this visit. Patient/Guardian expressed understanding and agreed to proceed.      Assessment and plan: Counselor will continue to meet with patient to address treatment plan goals. Patient will continue to follow recommendations of providers and implement skills learned in session.  Follow Up Instructions:   I discussed the assessment and treatment plan with the patient. The patient was provided an opportunity to ask questions and all were answered. The patient agreed with the plan and demonstrated an understanding of the instructions.   The patient was advised to call back or seek an in-person evaluation if the symptoms worsen or if the condition fails to improve as anticipated.  I provided 45 minutes of non-face-to-face time during this encounter.   [name] S, LCAS

## 2021-10-06 ENCOUNTER — Ambulatory Visit (INDEPENDENT_AMBULATORY_CARE_PROVIDER_SITE_OTHER): Payer: Medicaid Other | Admitting: Licensed Clinical Social Worker

## 2021-10-06 ENCOUNTER — Encounter (HOSPITAL_COMMUNITY): Payer: Self-pay | Admitting: Licensed Clinical Social Worker

## 2021-10-06 DIAGNOSIS — F331 Major depressive disorder, recurrent, moderate: Secondary | ICD-10-CM

## 2021-10-06 NOTE — Progress Notes (Signed)
Virtual Visit via Telephone Note  I connected with [name] on 10/06/2021 at 10:00 am by phone enabled telehealth application and verified that I am speaking withthe correct person using two identifiers. Patient does not have a computer or Internet.   I discussed the limitations of evaluation and management by telemedicine and the availability of in person appointments. The patient expressed understanding and agreed to proceed.  LOCATION:  Patient: Home Provider: Home Office  Treatment Goal Addressed: Develop the ability to recognize, accept, and cope with feelings of depression AEB self report.  Progress Towards Goal: Progressing   History of Present Illness: Pt was referred for OP therapy by her neurologist Dr. Charlotte Crumb for depression.   Observations/Objective: Patient presented for today's session on time and was alert, oriented x5, with no evidence or self-report of SI/HI or A/V H.  Patient reported ongoing compliance with medication and denied any use of alcohol or illicit substances. Clinician inquired about patient's current emotional ratings, as well as any significant changes in thoughts, feelings or behavior since previous session.  Patient reported emotional ratings of 5/10 for depression, 6/10 for anxiety, 2/10 for anger/irritability.  Cln and pt reviewed emotional ratings and coping skills.  "I'm just as stressed . Cln provided education on the cycle of anxiety. "I had no spells within the last week." Clinician utilized CBT to process concerns and challenges. Clinician processed thoughts, feelings, and behaviors. Clinician discussed coping skills and options for supporting herself through these challenging times.      Collaboration of Care: Other: Continue to see Dr. Doyne Keel and Dr. Charlotte Crumb.  Patient/Guardian was advised Release of Information must be obtained prior to any record release in order to collaborate their care with an outside provider. Patient/Guardian was advised  if they have not already done so to contact the registration department to sign all necessary forms in order for [place] to release information regarding their care.   Consent: Patient/Guardian gives verbal consent for treatment and assignment of benefits for services provided during this visit. Patient/Guardian expressed understanding and agreed to proceed.      Assessment and plan: Counselor will continue to meet with patient to address treatment plan goals. Patient will continue to follow recommendations of providers and implement skills learned in session.  Follow Up Instructions:   I discussed the assessment and treatment plan with the patient. The patient was provided an opportunity to ask questions and all were answered. The patient agreed with the plan and demonstrated an understanding of the instructions.   The patient was advised to call back or seek an in-person evaluation if the symptoms worsen or if the condition fails to improve as anticipated.  I provided 45 minutes of non-face-to-face time during this encounter.   [name] S, LCAS

## 2021-10-11 ENCOUNTER — Encounter (HOSPITAL_COMMUNITY): Payer: Self-pay | Admitting: Licensed Clinical Social Worker

## 2021-10-11 NOTE — Progress Notes (Signed)
Virtual Visit via Telephone Note  I connected with [name] on 09/08/2021 at 10:00 am by phone enabled telehealth application and verified that I am speaking withthe correct person using two identifiers. Patient does not have a computer or Internet.   I discussed the limitations of evaluation and management by telemedicine and the availability of in person appointments. The patient expressed understanding and agreed to proceed.  LOCATION:  Patient: Home Provider: Home Office  Treatment Goal Addressed: Develop the ability to recognize, accept, and cope with feelings of depression AEB self report.  Progress Towards Goal: Progressing   History of Present Illness: Pt was referred for OP therapy by her neurologist Dr. Charlotte Crumb for depression.   Observations/Objective: Patient presented for today's session on time and was alert, oriented x5, with no evidence or self-report of SI/HI or A/V H.  Patient reported ongoing compliance with medication and denied any use of alcohol or illicit substances. Clinician inquired about patient's current emotional ratings, as well as any significant changes in thoughts, feelings or behavior since previous session.  Patient reported emotional ratings of 5/10 for depression, 6/10 for anxiety, 2/10 for anger/irritability.  Cln and pt reviewed emotional ratings and coping skills.  "I continue to be stressed out but I'm having limited conversations with my daughter and her family."I had 0 dizzy spells within the last week. "Clinician utilized CBT to process concerns and challenges. Clinician processed thoughts, feelings, and behaviors. Clinician discussed coping skills and options for supporting herself through these challenging times. Pt  provided update on health concerns, and stress. Pt continued a discussion about her desire to move to [facility] with her sister and brother-in-law. Cln and pt explored goals and strategies for patient to complete her goal ( [employer] housing,  Fish farm manager, Engineer, materials)    Collaboration of Care: Other: Continue to see Dr. Doyne Keel and Dr. Charlotte Crumb.  Patient/Guardian was advised Release of Information must be obtained prior to any record release in order to collaborate their care with an outside provider. Patient/Guardian was advised if they have not already done so to contact the registration department to sign all necessary forms in order for [place] to release information regarding their care.   Consent: Patient/Guardian gives verbal consent for treatment and assignment of benefits for services provided during this visit. Patient/Guardian expressed understanding and agreed to proceed.      Assessment and plan: Counselor will continue to meet with patient to address treatment plan goals. Patient will continue to follow recommendations of providers and implement skills learned in session.  Follow Up Instructions:   I discussed the assessment and treatment plan with the patient. The patient was provided an opportunity to ask questions and all were answered. The patient agreed with the plan and demonstrated an understanding of the instructions.   The patient was advised to call back or seek an in-person evaluation if the symptoms worsen or if the condition fails to improve as anticipated.  I provided 45 minutes of non-face-to-face time during this encounter.   Tyia Binford S, LCAS

## 2021-10-12 ENCOUNTER — Encounter (HOSPITAL_COMMUNITY): Payer: Self-pay | Admitting: Licensed Clinical Social Worker

## 2021-10-12 NOTE — Progress Notes (Signed)
Virtual Visit via Telephone Note  I connected with [name] on 09/15/2021 at 10:00 am by phone enabled telehealth application and verified that I am speaking withthe correct person using two identifiers. Patient does not have a computer or Internet.   I discussed the limitations of evaluation and management by telemedicine and the availability of in person appointments. The patient expressed understanding and agreed to proceed.  LOCATION:  Patient: Home Provider: Home Office  Treatment Goal Addressed: Develop the ability to recognize, accept, and cope with feelings of depression AEB self report.  Progress Towards Goal: Progressing   History of Present Illness: Pt was referred for OP therapy by her neurologist Dr. Charlotte Crumb for depression.   Observations/Objective: Patient presented for today's session on time and was alert, oriented x5, with no evidence or self-report of SI/HI or A/V H.  Patient reported ongoing compliance with medication and denied any use of alcohol or illicit substances. Clinician inquired about patient's current emotional ratings, as well as any significant changes in thoughts, feelings or behavior since previous session.  Patient reported emotional ratings of 5/10 for depression, 6/10 for anxiety, 2/10 for anger/irritability.  Cln and pt reviewed emotional ratings and coping skills.  "I continue to be stressed out, but I imit conversations with my daughter and her family."I had 0 dizzy spells within the last week.  Cln provided education on the cycle of depression."Clinician utilized CBT to process concerns and challenges. Clinician processed thoughts, feelings, and behaviors. Clinician discussed coping skills and options for supporting herself through there continued stressors. Pt  provided update on health concerns, and stress.    Collaboration of Care: Other: Continue to see Dr. Doyne Keel and Dr. Charlotte Crumb.  Patient/Guardian was advised Release of Information must be  obtained prior to any record release in order to collaborate their care with an outside provider. Patient/Guardian was advised if they have not already done so to contact the registration department to sign all necessary forms in order for [place] to release information regarding their care.   Consent: Patient/Guardian gives verbal consent for treatment and assignment of benefits for services provided during this visit. Patient/Guardian expressed understanding and agreed to proceed.      Assessment and plan: Counselor will continue to meet with patient to address treatment plan goals. Patient will continue to follow recommendations of providers and implement skills learned in session.  Follow Up Instructions:   I discussed the assessment and treatment plan with the patient. The patient was provided an opportunity to ask questions and all were answered. The patient agreed with the plan and demonstrated an understanding of the instructions.   The patient was advised to call back or seek an in-person evaluation if the symptoms worsen or if the condition fails to improve as anticipated.  I provided 45 minutes of non-face-to-face time during this encounter.   [name] S, LCAS

## 2021-10-13 ENCOUNTER — Encounter (HOSPITAL_COMMUNITY): Payer: Self-pay | Admitting: Licensed Clinical Social Worker

## 2021-10-13 ENCOUNTER — Ambulatory Visit (INDEPENDENT_AMBULATORY_CARE_PROVIDER_SITE_OTHER): Payer: Medicaid Other | Admitting: Licensed Clinical Social Worker

## 2021-10-13 DIAGNOSIS — F331 Major depressive disorder, recurrent, moderate: Secondary | ICD-10-CM | POA: Diagnosis not present

## 2021-10-13 NOTE — Progress Notes (Signed)
Virtual Visit via Telephone Note  I connected with [name] on 10/13/2021 at 10:00 am by phone enabled telehealth application and verified that I am speaking withthe correct person using two identifiers. Patient does not have a computer or Internet.   I discussed the limitations of evaluation and management by telemedicine and the availability of in person appointments. The patient expressed understanding and agreed to proceed.  LOCATION:  Patient: Home Provider: Home Office  Treatment Goal Addressed: Develop the ability to recognize, accept, and cope with feelings of depression AEB self report.  Progress Towards Goal: Progressing   History of Present Illness: Pt was referred for OP therapy by her neurologist Dr. Charlotte Crumb for depression.   Observations/Objective: Patient presented for today's session on time and was alert, oriented x5, with no evidence or self-report of SI/HI or A/V H.  Patient reported ongoing compliance with medication and denied any use of alcohol or illicit substances. Clinician inquired about patient's current emotional ratings, as well as any significant changes in thoughts, feelings or behavior since previous session.  Patient reported emotional ratings of 5/10 for depression, 4/10 for anxiety, 2/10 for anger/irritability.  Cln and pt reviewed emotional ratings and coping skills.  "My daughter and her family are not bringing their drama to me. With them living in [place] I'm not involved in their everyday life." Cln provided education on the cycle of depression. "I had no spells within the last week." Clinician utilized CBT to process concerns and challenges. Clinician processed thoughts, feelings, and behaviors. Clinician discussed coping skills.  Pt talked about her brother who is addicted to crack cocaine and how it affects her and her family. Cln asked open ended questions.    Collaboration of Care: Other: Continue to see Dr. Doyne Keel and Dr.  Charlotte Crumb.  Patient/Guardian was advised Release of Information must be obtained prior to any record release in order to collaborate their care with an outside provider. Patient/Guardian was advised if they have not already done so to contact the registration department to sign all necessary forms in order for [place] to release information regarding their care.   Consent: Patient/Guardian gives verbal consent for treatment and assignment of benefits for services provided during this visit. Patient/Guardian expressed understanding and agreed to proceed.      Assessment and plan: Counselor will continue to meet with patient to address treatment plan goals. Patient will continue to follow recommendations of providers and implement skills learned in session.  Follow Up Instructions:   I discussed the assessment and treatment plan with the patient. The patient was provided an opportunity to ask questions and all were answered. The patient agreed with the plan and demonstrated an understanding of the instructions.   The patient was advised to call back or seek an in-person evaluation if the symptoms worsen or if the condition fails to improve as anticipated.  I provided 45 minutes of non-face-to-face time during this encounter.   Jameca Chumley S, LCAS

## 2021-10-19 NOTE — Progress Notes (Signed)
Established Patient Office Visit  Subjective   Patient ID: [name], female    DOB: 11/26/1955  Age: 66 y.o. MRN: [id number]  No chief complaint on file.     5/23 This is a 66 year old female last seen in January by PA Mcclung previously by Dr. Wynetta Emery for diabetes and hypertension.  She has more of a prediabetic condition but comes today with a blood pressure initially of 209/112 on recheck was 190/110.  She was in and out of her blood pressure medications for about a week.  She does not have a blood pressure meter at this time she lost the one she had.  She is supposed to be on hydralazine 50 mg twice a day amlodipine 10 mg a day and hydrochlorothiazide 25 mg a day.  She had normal readings last fall but these have increased since that she had lost her medication and lost her blood pressure meter.  Patient has carried a diagnosis of diabetes along a prediabetic range.  Last A1c 4 months ago was 6.0  Patient had an appointment in April with Dr. Wynetta Emery mid left without being seen when the office was busy for the patient to wait  This patient is also on [place] for seizures  The patient states her mental health is good she has a counselor and mental health provider  Patient has been seizure-free  Patient does note a rash on the face.  Patient does have elevated cholesterol.  Primarily has triglycerides  10/3    Patient Active Problem List   Diagnosis Date Noted  . Influenza vaccine refused 12/26/2019  . Adjustment disorder with anxious mood 08/18/2018  . Obesity (BMI 30-39.9) 09/17/2016  . Internal hemorrhoid 09/16/2016  . Adjustment disorder with mixed anxiety and depressed mood 12/04/2015  . Primary insomnia 12/04/2015  . Prediabetes 10/09/2013  . Seizures ([place]) 08/24/2013  . Class 2 severe obesity due to excess calories with serious comorbidity and body mass index (BMI) of 38.0 to 38.9 in adult [facility])   . 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 PES PLANUS, RIGHT 07/20/2007   . Acne 07/09/2006  . Major depressive disorder, recurrent episode, moderate ([place]) 03/18/2006  . Essential hypertension 03/18/2006  . PSORIASIS 03/18/2006   Past Medical History:  Diagnosis Date  . Anxiety   . Carpal tunnel syndrome   . Diabetes mellitus without complication ([place])   . Diabetes mellitus, type II ([place])   . DYSTONIA 01/04/2007   Qualifier: Diagnosis of  By: Zebedee Iba NP, Manuela Schwartz    . Facial droop 10/09/2013  . Hemorrhoid   . Hypertension   . Major depressive disorder, recurrent episode ([place]) 03/18/2006   Qualifier: Diagnosis of  By: Beryle Lathe    . Obesity, unspecified 10/17/2007   Qualifier: Diagnosis of  By: Jenne Campus PHD, Jeannie    . Prediabetes 10/09/2013  . Rectal bleeding 07/03/2015  . Seizures ([place])   . Seizures ([place]) 08/24/2013   Past Surgical History:  Procedure Laterality Date  . ABDOMINAL HYSTERECTOMY    . COLONOSCOPY N/A 07/15/2015   Dr. Oneida Alar: small-mouth diverticula, small non-bleeding internal hemorrhoids, redundant colon   . HERNIA REPAIR    . MYOMECTOMY     Social History   Tobacco Use  . Smoking status: Never  . Smokeless tobacco: Never  Vaping Use  . Vaping Use: Never used  Substance Use Topics  . Alcohol use: No    Alcohol/week: 0.0 standard drinks of alcohol  . Drug use: No   Social History   Socioeconomic History  .  Marital status: Divorced    Spouse name: Not on file  . Number of children: 1  . Years of education: Not on file  . Highest education level: Not on file  Occupational History  . Not on file  Tobacco Use  . Smoking status: Never  . Smokeless tobacco: Never  Vaping Use  . Vaping Use: Never used  Substance and Sexual Activity  . Alcohol use: No    Alcohol/week: 0.0 standard drinks of alcohol  . Drug use: No  . Sexual activity: Never  Other Topics Concern  . Not on file  Social History Narrative   Right handed    Lives alone    Lives in apartment  on 6th floor    No caffeine   Social Determinants of Health    Financial Resource Strain: Low Risk  (06/27/2021)   Overall Financial Resource Strain (CARDIA)   . Difficulty of Paying Living Expenses: Not hard at all  Food Insecurity: Food Insecurity Present (08/13/2020)   Hunger Vital Sign   . Worried About Charity fundraiser in the Last Year: Sometimes true   . Ran Out of Food in the Last Year: Never true  Transportation Needs: Not on file  Physical Activity: Not on file  Stress: Not on file  Social Connections: Not on file  Intimate Partner Violence: Not on file   Family Status  Relation Name Status  . Mother  Deceased  . Father  Deceased  . Sister  (Not Specified)  . Mat Uncle  (Not Specified)  . Cousin  (Not Specified)  . MGM  Deceased  . MGF  Deceased  . PGM  Deceased  . PGF  Deceased  . Neg Hx  (Not Specified)   Family History  Problem Relation Age of Onset  . Asthma Mother   . Hypertension Father   . Heart disease Father   . Stroke Father   . Cancer Sister        uterus cancer   . Diabetes Maternal Uncle   . Breast cancer Cousin   . Colon cancer Neg Hx    Allergies  Allergen Reactions  . Codeine Anaphylaxis  . Shellfish Allergy Swelling and Rash    Throat Sweling  . Apple Cider Vinegar     syncope  . Cinnamon Hives  . Cozaar [Losartan]     Makes her feel like she is having a heart attack  . Lisinopril     cough  . Opium     Interferes with Keppra  . Banana Rash    Cannot eat any while taking KEPPRA  . Catfish [Fish Allergy] Rash  . Orange Fruit [Citrus] Rash    Cannot have while taking KEPPRA  . Tomato Itching and Rash      Review of Systems  Constitutional:  Negative for chills, diaphoresis, fever, malaise/fatigue and weight loss.  HENT:  Positive for congestion. Negative for hearing loss, nosebleeds, sore throat and tinnitus.   Eyes:  Negative for blurred vision, photophobia and redness.  Respiratory:  Negative for cough, hemoptysis, sputum production, shortness of breath, wheezing and stridor.    Cardiovascular:  Negative for chest pain, palpitations, orthopnea, claudication, leg swelling and PND.  Gastrointestinal:  Negative for abdominal pain, blood in stool, constipation, diarrhea, heartburn, nausea and vomiting.  Genitourinary:  Negative for dysuria, flank pain, frequency, hematuria and urgency.  Musculoskeletal:  Negative for back pain, falls, joint pain, myalgias and neck pain.  Skin:  Negative for itching and rash.  Neurological:  Negative for dizziness, tingling, tremors, sensory change, speech change, focal weakness, seizures, loss of consciousness, weakness and headaches.  Endo/Heme/Allergies:  Negative for environmental allergies and polydipsia. Does not bruise/bleed easily.  Psychiatric/Behavioral:  Negative for depression, memory loss, substance abuse and suicidal ideas. The patient is not nervous/anxious and does not have insomnia.       Objective:     There were no vitals taken for this visit. BP Readings from Last 3 Encounters:  09/25/21 138/83  07/11/21 (!) 136/102  07/01/21 (!) 142/84   Wt Readings from Last 3 Encounters:  09/25/21 204 lb 12.8 oz (92.9 kg)  07/01/21 214 lb (97.1 kg)  06/10/21 213 lb 9.6 oz (96.9 kg)      Physical Exam Vitals reviewed.  Constitutional:      Appearance: Normal appearance. She is well-developed. She is obese. She is not diaphoretic.  HENT:     Head: Normocephalic and atraumatic.     Nose: No nasal deformity, septal deviation, mucosal edema or rhinorrhea.     Right Sinus: No maxillary sinus tenderness or frontal sinus tenderness.     Left Sinus: No maxillary sinus tenderness or frontal sinus tenderness.     Mouth/Throat:     Pharynx: No oropharyngeal exudate.  Eyes:     General: No scleral icterus.    Conjunctiva/sclera: Conjunctivae normal.     Pupils: Pupils are equal, round, and reactive to light.  Neck:     Thyroid: No thyromegaly.     Vascular: No carotid bruit or JVD.     Trachea: Trachea normal. No tracheal  tenderness or tracheal deviation.  Cardiovascular:     Rate and Rhythm: Normal rate and regular rhythm.     Chest Wall: PMI is not displaced.     Pulses: Normal pulses. No decreased pulses.     Heart sounds: Normal heart sounds, S1 normal and S2 normal. Heart sounds not distant. No murmur heard.    No systolic murmur is present.     No diastolic murmur is present.     No friction rub. No gallop. No S3 or S4 sounds.  Pulmonary:     Effort: No tachypnea, accessory muscle usage or respiratory distress.     Breath sounds: No stridor. No decreased breath sounds, wheezing, rhonchi or rales.  Chest:     Chest wall: No tenderness.  Abdominal:     General: Bowel sounds are normal. There is no distension.     Palpations: Abdomen is soft. Abdomen is not rigid.     Tenderness: There is no abdominal tenderness. There is no guarding or rebound.  Musculoskeletal:        General: Normal range of motion.     Cervical back: Normal range of motion and neck supple. No edema, erythema or rigidity. No muscular tenderness. Normal range of motion.  Lymphadenopathy:     Head:     Right side of head: No submental or submandibular adenopathy.     Left side of head: No submental or submandibular adenopathy.     Cervical: No cervical adenopathy.  Skin:    General: Skin is warm and dry.     Coloration: Skin is not pale.     Findings: Rash present.     Nails: There is no clubbing.  Neurological:     Mental Status: She is alert and oriented to person, place, and time.     Sensory: No sensory deficit.  Psychiatric:        Speech:  Speech normal.        Behavior: Behavior normal.     No results found for any visits on 10/21/21.  Last CBC Lab Results  Component Value Date   WBC 7.7 12/26/2019   HGB 12.5 12/26/2019   HCT 36.7 12/26/2019   MCV 80 12/26/2019   MCH 27.2 12/26/2019   RDW 14.0 12/26/2019   PLT 341 60/63/0160   Last metabolic panel Lab Results  Component Value Date   GLUCOSE 109 (H)  09/25/2021   NA 143 09/25/2021   K 4.4 09/25/2021   CL 106 09/25/2021   CO2 22 09/25/2021   BUN 15 09/25/2021   CREATININE 0.88 09/25/2021   EGFR 73 09/25/2021   CALCIUM 9.8 09/25/2021   PHOS 4.5 11/29/2006   PROT 7.2 09/25/2021   ALBUMIN 4.5 09/25/2021   LABGLOB 2.7 09/25/2021   AGRATIO 1.7 09/25/2021   BILITOT 0.2 09/25/2021   ALKPHOS 99 09/25/2021   AST 18 09/25/2021   ALT 14 09/25/2021   ANIONGAP 13 10/05/2013   Last lipids Lab Results  Component Value Date   CHOL 144 01/29/2021   HDL 49 01/29/2021   LDLCALC 68 01/29/2021   TRIG 155 (H) 01/29/2021   CHOLHDL 2.9 01/29/2021   Last hemoglobin A1c Lab Results  Component Value Date   HGBA1C 6.0 (H) 09/25/2021   Last thyroid functions Lab Results  Component Value Date   TSH 1.660 12/26/2019   Last vitamin D Lab Results  Component Value Date   VD25OH 33.0 12/26/2019      The 10-year ASCVD risk score (Arnett DK, et al., 2019) is: 18.1%    Assessment & Plan:   Problem List Items Addressed This Visit   None 38 minutes spent performing history and physical educating patient complex decision making treatment of hypertension  No follow-ups on file.    Asencion Noble, MD

## 2021-10-20 ENCOUNTER — Ambulatory Visit (INDEPENDENT_AMBULATORY_CARE_PROVIDER_SITE_OTHER): Payer: Medicaid Other | Admitting: Licensed Clinical Social Worker

## 2021-10-20 ENCOUNTER — Encounter (HOSPITAL_COMMUNITY): Payer: Self-pay | Admitting: Licensed Clinical Social Worker

## 2021-10-20 ENCOUNTER — Other Ambulatory Visit: Payer: Self-pay

## 2021-10-20 DIAGNOSIS — F331 Major depressive disorder, recurrent, moderate: Secondary | ICD-10-CM

## 2021-10-20 NOTE — Progress Notes (Signed)
Virtual Visit via Telephone Note  I connected with [name] on 10/13/2021 at 10:00 am by phone enabled telehealth application and verified that I am speaking withthe correct person using two identifiers. Patient does not have a computer or Internet.   I discussed the limitations of evaluation and management by telemedicine and the availability of in person appointments. The patient expressed understanding and agreed to proceed.  LOCATION:  Patient: Home Provider: Home Office  Treatment Goal Addressed: Develop the ability to recognize, accept, and cope with feelings of depression AEB self report.  Progress Towards Goal: Progressing   History of Present Illness: Pt was referred for OP therapy by her neurologist Dr. Charlotte Crumb for depression.   Observations/Objective: Patient presented for today's session on time and was alert, oriented x5, with no evidence or self-report of SI/HI or A/V H.  Patient reported ongoing compliance with medication and denied any use of alcohol or illicit substances. Clinician inquired about patient's current emotional ratings, as well as any significant changes in thoughts, feelings or behavior since previous session.  Patient reported emotional ratings of 5/10 for depression, 4/10 for anxiety, 2/10 for anger/irritability.  Cln and pt reviewed emotional ratings and coping skills.  "My daughter and her family are not bringing their drama to me. With them living in [place] I'm not involved in their everyday life." Cln provided education on the cycle of depression. "I had no spells within the last week." Clinician utilized CBT to process concerns and challenges. Clinician processed thoughts, feelings, and behaviors. Clinician discussed coping skills.  Pt talked about her brother who is addicted to crack cocaine and how it affects her and her family. Cln asked open ended questions.    Collaboration of Care: Other: Continue to see Dr. Doyne Keel and Dr.  Charlotte Crumb.  Patient/Guardian was advised Release of Information must be obtained prior to any record release in order to collaborate their care with an outside provider. Patient/Guardian was advised if they have not already done so to contact the registration department to sign all necessary forms in order for [place] to release information regarding their care.   Consent: Patient/Guardian gives verbal consent for treatment and assignment of benefits for services provided during this visit. Patient/Guardian expressed understanding and agreed to proceed.      Assessment and plan: Counselor will be on FMLA for 4-6 weeks. This is the last session with patient until I return. Cln and pt discussed having a temporary therapist while clin is out. "I choose to not have an interim therapist while she is on FMLA. Cln gave pt information for emergency situations. [place], [place], ED.   Follow Up Instructions:   I discussed the assessment and treatment plan with the patient. The patient was provided an opportunity to ask questions and all were answered. The patient agreed with the plan and demonstrated an understanding of the instructions.   The patient was advised to call back or seek an in-person evaluation if the symptoms worsen or if the condition fails to improve as anticipated.  I provided 45 minutes of non-face-to-face time during this encounter.   Alyviah Crandle S, LCAS

## 2021-10-21 ENCOUNTER — Other Ambulatory Visit: Payer: Self-pay

## 2021-10-21 ENCOUNTER — Encounter: Payer: Self-pay | Admitting: Critical Care Medicine

## 2021-10-21 ENCOUNTER — Ambulatory Visit: Payer: Medicaid Other | Attending: Critical Care Medicine | Admitting: Critical Care Medicine

## 2021-10-21 VITALS — BP 135/71 | HR 71 | Temp 98.7°F | Ht 62.0 in | Wt 201.2 lb

## 2021-10-21 DIAGNOSIS — E669 Obesity, unspecified: Secondary | ICD-10-CM

## 2021-10-21 DIAGNOSIS — R7303 Prediabetes: Secondary | ICD-10-CM

## 2021-10-21 DIAGNOSIS — F331 Major depressive disorder, recurrent, moderate: Secondary | ICD-10-CM | POA: Diagnosis not present

## 2021-10-21 DIAGNOSIS — L700 Acne vulgaris: Secondary | ICD-10-CM | POA: Diagnosis not present

## 2021-10-21 DIAGNOSIS — I10 Essential (primary) hypertension: Secondary | ICD-10-CM | POA: Diagnosis not present

## 2021-10-21 DIAGNOSIS — R569 Unspecified convulsions: Secondary | ICD-10-CM | POA: Diagnosis not present

## 2021-10-21 MED ORDER — AMLODIPINE BESYLATE 10 MG PO TABS
ORAL_TABLET | Freq: Every day | ORAL | 3 refills | Status: DC
  Filled 2021-10-21: qty 90, fill #0
  Filled 2021-12-18: qty 90, 90d supply, fill #0

## 2021-10-21 MED ORDER — SPIRONOLACTONE 25 MG PO TABS
25.0000 mg | ORAL_TABLET | Freq: Every day | ORAL | 3 refills | Status: DC
  Filled 2021-10-21 – 2021-12-18 (×2): qty 60, 60d supply, fill #0

## 2021-10-21 MED ORDER — METFORMIN HCL 500 MG PO TABS
ORAL_TABLET | Freq: Every day | ORAL | 2 refills | Status: DC
  Filled 2021-10-21: qty 90, fill #0
  Filled 2021-12-18: qty 90, 90d supply, fill #0

## 2021-10-21 MED ORDER — LEVETIRACETAM ER 500 MG PO TB24
ORAL_TABLET | ORAL | 3 refills | Status: DC
Start: 2021-10-21 — End: 2022-02-24
  Filled 2021-10-21: qty 270, fill #0
  Filled 2021-12-18: qty 270, 90d supply, fill #0

## 2021-10-21 MED ORDER — CARVEDILOL 12.5 MG PO TABS
12.5000 mg | ORAL_TABLET | Freq: Two times a day (BID) | ORAL | 3 refills | Status: DC
Start: 2021-10-21 — End: 2022-02-24
  Filled 2021-10-21 – 2021-12-18 (×2): qty 120, 60d supply, fill #0

## 2021-10-21 MED ORDER — VITAMIN D (ERGOCALCIFEROL) 1.25 MG (50000 UNIT) PO CAPS
ORAL_CAPSULE | ORAL | 3 refills | Status: DC
Start: 2021-10-21 — End: 2022-02-24
  Filled 2021-10-21: qty 12, fill #0
  Filled 2021-12-18: qty 12, 84d supply, fill #0

## 2021-10-21 MED ORDER — LORATADINE 10 MG PO TABS
ORAL_TABLET | Freq: Every day | ORAL | 2 refills | Status: DC
  Filled 2021-10-21: qty 60, fill #0
  Filled 2021-12-18: qty 30, 30d supply, fill #0
  Filled 2022-08-18: qty 30, 30d supply, fill #1

## 2021-10-21 MED ORDER — TRETINOIN 0.05 % EX CREA
TOPICAL_CREAM | Freq: Every day | CUTANEOUS | 2 refills | Status: DC
  Filled 2021-10-21: qty 45, 30d supply, fill #0

## 2021-10-21 MED ORDER — HYDRALAZINE HCL 50 MG PO TABS
50.0000 mg | ORAL_TABLET | Freq: Three times a day (TID) | ORAL | 6 refills | Status: DC
  Filled 2021-10-21: qty 90, 30d supply, fill #0
  Filled 2021-11-04 – 2021-11-05 (×2): qty 90, 30d supply, fill #1
  Filled 2021-12-18: qty 90, 30d supply, fill #2

## 2021-10-21 NOTE — Assessment & Plan Note (Signed)
Continue with Retin-A

## 2021-10-21 NOTE — Assessment & Plan Note (Signed)
As per mental health 

## 2021-10-21 NOTE — Assessment & Plan Note (Signed)
Blood pressure well controlled no changes made ?

## 2021-10-21 NOTE — Assessment & Plan Note (Signed)
Weight is down continue current diet

## 2021-10-21 NOTE — Assessment & Plan Note (Signed)
Continue current dose of Keppra

## 2021-10-21 NOTE — Patient Instructions (Signed)
No change in medications refills available downstairs pharmacy  Labs today urine for microalbumin we will call you results  Excellent improvement in blood pressure  Return to Dr. Joya Gaskins 4 months

## 2021-10-21 NOTE — Assessment & Plan Note (Signed)
Check urine for microalbumin and continue metformin

## 2021-10-22 ENCOUNTER — Other Ambulatory Visit: Payer: Self-pay

## 2021-10-22 LAB — MICROALBUMIN / CREATININE URINE RATIO
Creatinine, Urine: 173.9 mg/dL
Microalb/Creat Ratio: 27 mg/g creat (ref 0–29)
Microalbumin, Urine: 47 ug/mL

## 2021-10-22 NOTE — Progress Notes (Signed)
Let patient know urine for micro albumin normal

## 2021-10-23 ENCOUNTER — Telehealth: Payer: Self-pay

## 2021-10-23 NOTE — Telephone Encounter (Signed)
-----   Message from [name], MD sent at 10/22/2021  7:37 PM EDT ----- Let patient know urine for micro albumin normal

## 2021-10-23 NOTE — Telephone Encounter (Signed)
Pt was called and is aware of results, DOB was confirmed.  ?

## 2021-11-04 ENCOUNTER — Ambulatory Visit: Payer: Medicaid Other | Attending: Critical Care Medicine | Admitting: Pharmacist

## 2021-11-04 ENCOUNTER — Encounter: Payer: Self-pay | Admitting: Pharmacist

## 2021-11-04 ENCOUNTER — Other Ambulatory Visit: Payer: Self-pay

## 2021-11-04 VITALS — BP 148/82 | HR 59

## 2021-11-04 DIAGNOSIS — I10 Essential (primary) hypertension: Secondary | ICD-10-CM

## 2021-11-04 NOTE — Progress Notes (Signed)
S:    PCP: Dr.Wright  [name] is a 66 y.o. female who presents for hypertension evaluation, education, and management. PMH is significant for HTN, psoriasis, depression, anxiety, prediabetes, insomnia, obesity, and seizures. She was last seen and referred by Dr. Joya Gaskins on 10/21/2021. BP at that appointment looked good. No changes were made to her antihypertensive regimen.  Today, pt reports doing well. Denies any current dizziness, HA, blurred vision, or chest pains. She does tell me that the pharmacy "messed up my medications" last time she picked-up hydralazine. She tells me there were 6 tablets (out of 90 total) in her hydralazine bottle that did not look like the other tablets. When she took these, she broke out on her face, her heart rate increased,  and she felt short of breath when she took the different pills. Since then, she has not taken any hydralazine for fear of a reaction.   Family/Social history:  - FHx: asthma, HTN, heart disease, stroke  - Tobacco: never smoker  - Alcohol: denies use   Medication adherence appears suboptimal. Patient has taken her morning BP medications today with the exception of hydralazine.   Current antihypertensives include: amlodipine 10mg  once daily at bedtime, carvedilol 12.5mg  BID, hydralazine 50mg  TID (has not taken), spironolactone 25mg  once daily in the mornings  Antihypertensives tried in the past include: lisinopril (cough), losartan (feels that she is having a heart attack)  Reported home BP readings: not currently taking her BP at home. She plans to go get batteries today for her cuff.  O:  Vitals:   11/04/21 1106  BP: (!) 148/82  Pulse: (!) 59   Last 3 Office BP readings: BP Readings from Last 3 Encounters:  11/04/21 (!) 148/82  10/21/21 135/71  09/25/21 138/83    BMET    Component Value Date/Time   NA 143 09/25/2021 1123   K 4.4 09/25/2021 1123   CL 106 09/25/2021 1123   CO2 22 09/25/2021 1123   GLUCOSE 109 (H)  09/25/2021 1123   GLUCOSE 86 02/18/2016 0948   BUN 15 09/25/2021 1123   CREATININE 0.88 09/25/2021 1123   CREATININE 0.89 02/18/2016 0948   CALCIUM 9.8 09/25/2021 1123   GFRNONAA 60 12/26/2019 0949   GFRNONAA 73 08/17/2014 1013   GFRAA 69 12/26/2019 0949   GFRAA 84 08/17/2014 1013    Clinical ASCVD: No  The 10-year ASCVD risk score (Arnett DK, et al., 2019) is: 21.2%   Values used to calculate the score:     Age: 45 years     Sex: Female     Is Non-Hispanic African American: Yes     Diabetic: Yes     Tobacco smoker: No     Systolic Blood Pressure: 725 mmHg     Is BP treated: Yes     HDL Cholesterol: 49 mg/dL     Total Cholesterol: 144 mg/dL  Patient is participating in a Managed Medicaid Plan:  Yes    A/P: Hypertension longstanding currently above goal on current medications. BP goal < 130/80 mmHg. Medication adherence appears suboptimal. I have coordinated with our pharmacy to ensure that her hydralazine is filled correctly.  -Continued amlodipine 10mg  once daily at bedtime -Continued carvedilol 12.5mg  BID -Continued hydralazine 50mg  TID -Continued spironolactone 25mg  once daily.  -Encouraged her to take her BP at home, especially when she experiences dizziness and bring a log of the readings to her next visit.  -Counseled on lifestyle modifications for blood pressure control including reduced dietary sodium, increased  exercise, adequate sleep.   Results reviewed and written information provided.    Written patient instructions provided. Patient verbalized understanding of treatment plan.  Total time in face to face counseling 30 minutes.    Follow-up:  PCP clinic visit in Feb.   Butch Penny, PharmD, Patsy [place], CPP Clinical Pharmacist [facility] [phone]

## 2021-11-05 ENCOUNTER — Other Ambulatory Visit: Payer: Self-pay

## 2021-11-24 ENCOUNTER — Other Ambulatory Visit: Payer: Self-pay

## 2021-11-27 ENCOUNTER — Other Ambulatory Visit: Payer: Self-pay

## 2021-11-28 ENCOUNTER — Other Ambulatory Visit: Payer: Self-pay

## 2021-12-18 ENCOUNTER — Ambulatory Visit: Payer: Medicaid Other | Admitting: Podiatry

## 2021-12-18 ENCOUNTER — Other Ambulatory Visit: Payer: Self-pay

## 2021-12-18 DIAGNOSIS — M79675 Pain in left toe(s): Secondary | ICD-10-CM | POA: Diagnosis not present

## 2021-12-18 DIAGNOSIS — E119 Type 2 diabetes mellitus without complications: Secondary | ICD-10-CM | POA: Diagnosis not present

## 2021-12-18 DIAGNOSIS — B351 Tinea unguium: Secondary | ICD-10-CM

## 2021-12-18 DIAGNOSIS — M79674 Pain in right toe(s): Secondary | ICD-10-CM | POA: Diagnosis not present

## 2021-12-21 NOTE — Progress Notes (Signed)
Subjective: 66 year old female presents the office with concerns of thick, discolored toenails that she cannot trim herself and they are causing discomfort as well as for calluses.  She has no new concerns today.  Overall she has no other concerns.  No open lesions.  Marcine Matar, MD  Objective: AAO x3, NAD DP/PT pulses palpable bilaterally, CRT less than 3 seconds Nails are hypertrophic, dystrophic, brittle, discolored, elongated 10. No surrounding redness or drainage. Tenderness nails 1-5 bilaterally. There is mild hyperkeratotic lesion present right submetatarsal 1 as well as bilateral medial posterior foot and medial hallux bilaterally.  There is no underlying ulceration drainage or any signs of infection. There is a annular hyper pigmented lesion present on the right third toe consistent with a mole.  Still unchanged. No pain with calf compression, swelling, warmth, erythema  Assessment: Type 2 diabetes with neuropathy, symptomatic onychomycosis/hyperkeratotic lesions  Plan: -All treatment options discussed with the patient including all alternatives, risks, complications.  -Sharply debrided nails x10 without any complications or bleeding -Calluses were not significant today.  Recommend moisturizer and offloading.  As a courtesy I did debride some of them without any complications or bleeding otherwise not much to debride. -Discussed daily foot inspection -Patient encouraged to call the office with any questions, concerns, change in symptoms.   Vivi Barrack DPM

## 2021-12-25 ENCOUNTER — Ambulatory Visit (HOSPITAL_COMMUNITY): Payer: Medicaid Other | Admitting: Psychiatry

## 2022-01-06 ENCOUNTER — Ambulatory Visit (INDEPENDENT_AMBULATORY_CARE_PROVIDER_SITE_OTHER): Payer: Medicaid Other | Admitting: Licensed Clinical Social Worker

## 2022-01-06 DIAGNOSIS — F331 Major depressive disorder, recurrent, moderate: Secondary | ICD-10-CM | POA: Diagnosis not present

## 2022-01-21 ENCOUNTER — Encounter (HOSPITAL_COMMUNITY): Payer: Self-pay | Admitting: Licensed Clinical Social Worker

## 2022-01-21 NOTE — Progress Notes (Signed)
Virtual Visit via Telephone Note  I connected with [name] on 12/192023 at 1:00 am by phone enabled telehealth application and verified that I am speaking withthe correct person using two identifiers. Patient does not have a computer or Internet.   I discussed the limitations of evaluation and management by telemedicine and the availability of in person appointments. The patient expressed understanding and agreed to proceed.  LOCATION:  Patient: Home Provider: Home Office  Treatment Goal Addressed: Develop the ability to recognize, accept, and cope with feelings of depression AEB self report.  Progress Towards Goal: Progressing   History of Present Illness: Pt was referred for OP therapy by her neurologist Dr. Charlotte Crumb for depression.   Observations/Objective: Patient presented for today's session on time and was alert, oriented x5, with no evidence or self-report of SI/HI or A/V H.  Patient reported ongoing compliance with medication and denied any use of alcohol or illicit substances. Clinician inquired about patient's current emotional ratings, as well as any significant changes in thoughts, feelings or behaviors. Patient reported emotional ratings of 5/10 for depression, 4/10 for anxiety, 2/10 for anger/irritability.  Today is the first day back for therapist who has been on FMLA. Pt provided an update on mental health and physical health while therapist was out. Pt chose not to have an interim therapist but was given resources to use in case of crisis.         Collaboration of Care: Other: Continue to see Dr. Doyne Keel and Dr. Charlotte Crumb.  Patient/Guardian was advised Release of Information must be obtained prior to any record release in order to collaborate their care with an outside provider. Patient/Guardian was advised if they have not already done so to contact the registration department to sign all necessary forms in order for [place] to release information regarding their care.    Consent: Patient/Guardian gives verbal consent for treatment and assignment of benefits for services provided during this visit. Patient/Guardian expressed understanding and agreed to proceed.      Assessment and plan: Counselor will be on FMLA for 4-6 weeks. This is the last session with patient until I return. Cln and pt discussed having a temporary therapist while clin is out. "I choose to not have an interim therapist while she is on FMLA. Cln gave pt information for emergency situations. [place], [place], ED.   Follow Up Instructions:   I discussed the assessment and treatment plan with the patient. The patient was provided an opportunity to ask questions and all were answered. The patient agreed with the plan and demonstrated an understanding of the instructions.   The patient was advised to call back or seek an in-person evaluation if the symptoms worsen or if the condition fails to improve as anticipated.  I provided 45 minutes of non-face-to-face time during this encounter.   Yolandra Habig S, LCAS

## 2022-01-26 ENCOUNTER — Ambulatory Visit (INDEPENDENT_AMBULATORY_CARE_PROVIDER_SITE_OTHER): Payer: Medicaid Other | Admitting: Licensed Clinical Social Worker

## 2022-01-26 DIAGNOSIS — F331 Major depressive disorder, recurrent, moderate: Secondary | ICD-10-CM

## 2022-02-09 ENCOUNTER — Ambulatory Visit (INDEPENDENT_AMBULATORY_CARE_PROVIDER_SITE_OTHER): Payer: Medicaid Other | Admitting: Licensed Clinical Social Worker

## 2022-02-09 DIAGNOSIS — F331 Major depressive disorder, recurrent, moderate: Secondary | ICD-10-CM | POA: Diagnosis not present

## 2022-02-13 ENCOUNTER — Other Ambulatory Visit: Payer: Self-pay

## 2022-02-21 ENCOUNTER — Encounter (HOSPITAL_COMMUNITY): Payer: Self-pay | Admitting: Licensed Clinical Social Worker

## 2022-02-21 NOTE — Progress Notes (Signed)
Virtual Visit via Telephone Note  I connected with [name] on 1/82023 at 1:00 am by phone enabled telehealth application and verified that I am speaking withthe correct person using two identifiers. Patient does not have a computer or Internet.   I discussed the limitations of evaluation and management by telemedicine and the availability of in person appointments. The patient expressed understanding and agreed to proceed.  LOCATION:  Patient: Home Provider: Home Office  Treatment Goal Addressed: Develop the ability to recognize, accept, and cope with feelings of depression AEB self report.  Progress Towards Goal: Progressing   History of Present Illness: Pt was referred for OP therapy by her neurologist Dr. Charlotte Crumb for depression.   Observations/Objective: Patient presented for today's session on time and was alert, oriented x5, with no evidence or self-report of SI/HI or A/V H.  Patient reported ongoing compliance with medication and denied any use of alcohol or illicit substances. Clinician inquired about patient's current emotional ratings, as well as any significant changes in thoughts, feelings or behaviors. Patient reports on the holidays with family. Patient described her holidays and coping skills used to deal with feelings surrounding the holidays. Clinician congratulated patient on coping skills used and tolerating the holidays with family.     Collaboration of Care: Other: Continue to see Dr. Doyne Keel and Dr. Charlotte Crumb.  Patient/Guardian was advised Release of Information must be obtained prior to any record release in order to collaborate their care with an outside provider. Patient/Guardian was advised if they have not already done so to contact the registration department to sign all necessary forms in order for [place] to release information regarding their care.   Consent: Patient/Guardian gives verbal consent for treatment and assignment of benefits for services provided  during this visit. Patient/Guardian expressed understanding and agreed to proceed.      Assessment and plan: Counselor will continue to meet with patient to address treatment plan goals. Patient will continue to follow recommendations of providers and implement skills learned in session.   Follow Up Instructions:   I discussed the assessment and treatment plan with the patient. The patient was provided an opportunity to ask questions and all were answered. The patient agreed with the plan and demonstrated an understanding of the instructions.   The patient was advised to call back or seek an in-person evaluation if the symptoms worsen or if the condition fails to improve as anticipated.  I provided 45 minutes of non-face-to-face time during this encounter.   Akansha Wyche S, LCAS

## 2022-02-21 NOTE — Progress Notes (Unsigned)
Established Patient Office Visit  Subjective   Patient ID: [name], female    DOB: 01-13-1956  Age: 67 y.o. MRN: [id number]  No chief complaint on file.     5/23 This is a 67 year old female last seen in January by PA Mcclung previously by Dr. Wynetta Emery for diabetes and hypertension.  She has more of a prediabetic condition but comes today with a blood pressure initially of 209/112 on recheck was 190/110.  She was in and out of her blood pressure medications for about a week.  She does not have a blood pressure meter at this time she lost the one she had.  She is supposed to be on hydralazine 50 mg twice a day amlodipine 10 mg a day and hydrochlorothiazide 25 mg a day.  She had normal readings last fall but these have increased since that she had lost her medication and lost her blood pressure meter.  Patient has carried a diagnosis of diabetes along a prediabetic range.  Last A1c 4 months ago was 6.0  Patient had an appointment in April with Dr. Wynetta Emery mid left without being seen when the office was busy for the patient to wait  This patient is also on [place] for seizures  The patient states her mental health is good she has a counselor and mental health provider  Patient has been seizure-free  Patient does note a rash on the face.  Patient does have elevated cholesterol.  Primarily has triglycerides  10/3  Patient states since last visit she has done very well on arrival blood pressure is much improved 135/71.  She is looking for a new blood pressure meter and would like to get the [employer] she was given different locations to try to find 1.  She states her acne is improved.  She does have some foot pain and is followed by podiatry.  There are no other complaints.   Below is documentation from clinical pharmacy earlier last month Seen by [facility] CPP 09/25/2021 Hypertension longstanding currently close to goal on current medications. BP goal < 130/80 mmHg. Medication adherence appears  optimal. Given her frequency of dizzy spells, BP close to goal, and that she does not have a working BP cuff at this time, no changes were made to her home regimen.  -Continued amlodipine 10mg  once daily at bedtime -Continued carvedilol 12.5mg  BID -Continued hydralazine 50mg  TID -Continued spironolactone 25mg  once daily.  -Encouraged her to take her BP at home, especially when she experiences dizziness and bring a log of the readings to her next visit.   Patient states she is no longer having dizziness with her condition.  She does need to have a urine for microalbumin note her A1c is 6.0 recently and electrolytes were normal as was kidney function.  02/24/22 Saw Luke HTN Hypertension longstanding currently above goal on current medications. BP goal < 130/80 mmHg. Medication adherence appears suboptimal. I have coordinated with our pharmacy to ensure that her hydralazine is filled correctly.  -Continued amlodipine 10mg  once daily at bedtime -Continued carvedilol 12.5mg  BID -Continued hydralazine 50mg  TID -Continued spironolactone 25mg  once daily.  -Encouraged her to take her BP at home, especially when she experiences dizziness and bring a log of the readings to her next visit.  -Counseled on lifestyle modifications for blood pressure control including reduced dietary sodium, increased exercise, adequate sleep.      Patient Active Problem List   Diagnosis Date Noted  . Influenza vaccine refused 12/26/2019  . Adjustment disorder with  anxious mood 08/18/2018  . Obesity (BMI 30-39.9) 09/17/2016  . Internal hemorrhoid 09/16/2016  . Adjustment disorder with mixed anxiety and depressed mood 12/04/2015  . Primary insomnia 12/04/2015  . Prediabetes 10/09/2013  . Seizures ([place]) 08/24/2013  . Class 2 severe obesity due to excess calories with serious comorbidity and body mass index (BMI) of 38.0 to 38.9 in adult [facility])   . 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 PES PLANUS, RIGHT 07/20/2007  . Acne  07/09/2006  . Major depressive disorder, recurrent episode, moderate ([place]) 03/18/2006  . Essential hypertension 03/18/2006  . PSORIASIS 03/18/2006   Past Medical History:  Diagnosis Date  . Anxiety   . Carpal tunnel syndrome   . Diabetes mellitus without complication ([place])   . Diabetes mellitus, type II ([place])   . DYSTONIA 01/04/2007   Qualifier: Diagnosis of  By: Zebedee Iba NP, Manuela Schwartz    . Facial droop 10/09/2013  . Hemorrhoid   . Hypertension   . Major depressive disorder, recurrent episode ([place]) 03/18/2006   Qualifier: Diagnosis of  By: Beryle Lathe    . Obesity, unspecified 10/17/2007   Qualifier: Diagnosis of  By: Jenne Campus PHD, Jeannie    . Prediabetes 10/09/2013  . Rectal bleeding 07/03/2015  . Seizures ([place])   . Seizures ([place]) 08/24/2013   Past Surgical History:  Procedure Laterality Date  . ABDOMINAL HYSTERECTOMY    . COLONOSCOPY N/A 07/15/2015   Dr. Oneida Alar: small-mouth diverticula, small non-bleeding internal hemorrhoids, redundant colon   . HERNIA REPAIR    . MYOMECTOMY     Social History   Tobacco Use  . Smoking status: Never  . Smokeless tobacco: Never  Vaping Use  . Vaping Use: Never used  Substance Use Topics  . Alcohol use: No    Alcohol/week: 0.0 standard drinks of alcohol  . Drug use: No   Social History   Socioeconomic History  . Marital status: Divorced    Spouse name: Not on file  . Number of children: 1  . Years of education: Not on file  . Highest education level: Not on file  Occupational History  . Not on file  Tobacco Use  . Smoking status: Never  . Smokeless tobacco: Never  Vaping Use  . Vaping Use: Never used  Substance and Sexual Activity  . Alcohol use: No    Alcohol/week: 0.0 standard drinks of alcohol  . Drug use: No  . Sexual activity: Never  Other Topics Concern  . Not on file  Social History Narrative   Right handed    Lives alone    Lives in apartment  on 6th floor    No caffeine   Social Determinants of Health    Financial Resource Strain: Low Risk  (06/27/2021)   Overall Financial Resource Strain (CARDIA)   . Difficulty of Paying Living Expenses: Not hard at all  Food Insecurity: Food Insecurity Present (08/13/2020)   Hunger Vital Sign   . Worried About Charity fundraiser in the Last Year: Sometimes true   . Ran Out of Food in the Last Year: Never true  Transportation Needs: Not on file  Physical Activity: Not on file  Stress: Not on file  Social Connections: Not on file  Intimate Partner Violence: Not on file   Family Status  Relation Name Status  . Mother  Deceased  . Father  Deceased  . Sister  (Not Specified)  . Mat Uncle  (Not Specified)  . Cousin  (Not Specified)  . MGM  Deceased  .  MGF  Deceased  . PGM  Deceased  . PGF  Deceased  . Neg Hx  (Not Specified)   Family History  Problem Relation Age of Onset  . Asthma Mother   . Hypertension Father   . Heart disease Father   . Stroke Father   . Cancer Sister        uterus cancer   . Diabetes Maternal Uncle   . Breast cancer Cousin   . Colon cancer Neg Hx    Allergies  Allergen Reactions  . Codeine Anaphylaxis  . Shellfish Allergy Swelling and Rash    Throat Sweling  . Apple Cider Vinegar     syncope  . Cinnamon Hives  . Cozaar [Losartan]     Makes her feel like she is having a heart attack  . Lisinopril     cough  . Opium     Interferes with Keppra  . Banana Rash    Cannot eat any while taking KEPPRA  . Catfish [Fish Allergy] Rash  . Orange Fruit [Citrus] Rash    Cannot have while taking KEPPRA  . Tomato Itching and Rash      Review of Systems  Constitutional:  Negative for chills, diaphoresis, fever, malaise/fatigue and weight loss.  HENT:  Negative for congestion, hearing loss, nosebleeds, sore throat and tinnitus.   Eyes:  Negative for blurred vision, photophobia and redness.  Respiratory:  Negative for cough, hemoptysis, sputum production, shortness of breath, wheezing and stridor.   Cardiovascular:   Negative for chest pain, palpitations, orthopnea, claudication, leg swelling and PND.  Gastrointestinal:  Negative for abdominal pain, blood in stool, constipation, diarrhea, heartburn, nausea and vomiting.  Genitourinary:  Negative for dysuria, flank pain, frequency, hematuria and urgency.  Musculoskeletal:  Negative for back pain, falls, joint pain, myalgias and neck pain.       Neuropathic foot pain  Skin:  Negative for itching and rash.  Neurological:  Negative for dizziness, tingling, tremors, sensory change, speech change, focal weakness, seizures, loss of consciousness, weakness and headaches.  Endo/Heme/Allergies:  Negative for environmental allergies and polydipsia. Does not bruise/bleed easily.  Psychiatric/Behavioral:  Negative for depression, memory loss, substance abuse and suicidal ideas. The patient is not nervous/anxious and does not have insomnia.       Objective:     There were no vitals taken for this visit. BP Readings from Last 3 Encounters:  11/04/21 (!) 148/82  10/21/21 135/71  09/25/21 138/83   Wt Readings from Last 3 Encounters:  10/21/21 201 lb 3.2 oz (91.3 kg)  09/25/21 204 lb 12.8 oz (92.9 kg)  07/01/21 214 lb (97.1 kg)      Physical Exam Vitals reviewed.  Constitutional:      Appearance: Normal appearance. She is well-developed. She is obese. She is not diaphoretic.  HENT:     Head: Normocephalic and atraumatic.     Nose: No nasal deformity, septal deviation, mucosal edema or rhinorrhea.     Right Sinus: No maxillary sinus tenderness or frontal sinus tenderness.     Left Sinus: No maxillary sinus tenderness or frontal sinus tenderness.     Mouth/Throat:     Pharynx: No oropharyngeal exudate.  Eyes:     General: No scleral icterus.    Conjunctiva/sclera: Conjunctivae normal.     Pupils: Pupils are equal, round, and reactive to light.  Neck:     Thyroid: No thyromegaly.     Vascular: No carotid bruit or JVD.     Trachea: Trachea normal.  No  tracheal tenderness or tracheal deviation.  Cardiovascular:     Rate and Rhythm: Normal rate and regular rhythm.     Chest Wall: PMI is not displaced.     Pulses: Normal pulses. No decreased pulses.     Heart sounds: Normal heart sounds, S1 normal and S2 normal. Heart sounds not distant. No murmur heard.    No systolic murmur is present.     No diastolic murmur is present.     No friction rub. No gallop. No S3 or S4 sounds.  Pulmonary:     Effort: No tachypnea, accessory muscle usage or respiratory distress.     Breath sounds: No stridor. No decreased breath sounds, wheezing, rhonchi or rales.  Chest:     Chest wall: No tenderness.  Abdominal:     General: Bowel sounds are normal. There is no distension.     Palpations: Abdomen is soft. Abdomen is not rigid.     Tenderness: There is no abdominal tenderness. There is no guarding or rebound.  Musculoskeletal:        General: Normal range of motion.     Cervical back: Normal range of motion and neck supple. No edema, erythema or rigidity. No muscular tenderness. Normal range of motion.     Comments: Onychomycosis  Lymphadenopathy:     Head:     Right side of head: No submental or submandibular adenopathy.     Left side of head: No submental or submandibular adenopathy.     Cervical: No cervical adenopathy.  Skin:    General: Skin is warm and dry.     Coloration: Skin is not pale.     Findings: No rash.     Nails: There is no clubbing.  Neurological:     Mental Status: She is alert and oriented to person, place, and time.     Sensory: No sensory deficit.  Psychiatric:        Speech: Speech normal.        Behavior: Behavior normal.     No results found for any visits on 02/24/22.  Last CBC Lab Results  Component Value Date   WBC 7.7 12/26/2019   HGB 12.5 12/26/2019   HCT 36.7 12/26/2019   MCV 80 12/26/2019   MCH 27.2 12/26/2019   RDW 14.0 12/26/2019   PLT 341 14/78/2956   Last metabolic panel Lab Results  Component  Value Date   GLUCOSE 109 (H) 09/25/2021   NA 143 09/25/2021   K 4.4 09/25/2021   CL 106 09/25/2021   CO2 22 09/25/2021   BUN 15 09/25/2021   CREATININE 0.88 09/25/2021   EGFR 73 09/25/2021   CALCIUM 9.8 09/25/2021   PHOS 4.5 11/29/2006   PROT 7.2 09/25/2021   ALBUMIN 4.5 09/25/2021   LABGLOB 2.7 09/25/2021   AGRATIO 1.7 09/25/2021   BILITOT 0.2 09/25/2021   ALKPHOS 99 09/25/2021   AST 18 09/25/2021   ALT 14 09/25/2021   ANIONGAP 13 10/05/2013   Last lipids Lab Results  Component Value Date   CHOL 144 01/29/2021   HDL 49 01/29/2021   LDLCALC 68 01/29/2021   TRIG 155 (H) 01/29/2021   CHOLHDL 2.9 01/29/2021   Last hemoglobin A1c Lab Results  Component Value Date   HGBA1C 6.0 (H) 09/25/2021   Last thyroid functions Lab Results  Component Value Date   TSH 1.660 12/26/2019   Last vitamin D Lab Results  Component Value Date   VD25OH 33.0 12/26/2019  The 10-year ASCVD risk score (Arnett DK, et al., 2019) is: 21.2%    Assessment & Plan:   Problem List Items Addressed This Visit   None No follow-ups on file.    Shan Levans, MD

## 2022-02-24 ENCOUNTER — Encounter: Payer: Self-pay | Admitting: Critical Care Medicine

## 2022-02-24 ENCOUNTER — Ambulatory Visit: Payer: Medicaid Other | Attending: Critical Care Medicine | Admitting: Critical Care Medicine

## 2022-02-24 ENCOUNTER — Other Ambulatory Visit: Payer: Self-pay

## 2022-02-24 VITALS — BP 216/104 | Wt 197.6 lb

## 2022-02-24 DIAGNOSIS — I10 Essential (primary) hypertension: Secondary | ICD-10-CM

## 2022-02-24 DIAGNOSIS — R7303 Prediabetes: Secondary | ICD-10-CM | POA: Diagnosis not present

## 2022-02-24 DIAGNOSIS — Z6838 Body mass index (BMI) 38.0-38.9, adult: Secondary | ICD-10-CM

## 2022-02-24 DIAGNOSIS — E119 Type 2 diabetes mellitus without complications: Secondary | ICD-10-CM | POA: Diagnosis not present

## 2022-02-24 DIAGNOSIS — E559 Vitamin D deficiency, unspecified: Secondary | ICD-10-CM | POA: Diagnosis not present

## 2022-02-24 DIAGNOSIS — E1141 Type 2 diabetes mellitus with diabetic mononeuropathy: Secondary | ICD-10-CM | POA: Diagnosis not present

## 2022-02-24 DIAGNOSIS — R569 Unspecified convulsions: Secondary | ICD-10-CM | POA: Diagnosis not present

## 2022-02-24 DIAGNOSIS — F331 Major depressive disorder, recurrent, moderate: Secondary | ICD-10-CM | POA: Diagnosis not present

## 2022-02-24 DIAGNOSIS — G629 Polyneuropathy, unspecified: Secondary | ICD-10-CM | POA: Insufficient documentation

## 2022-02-24 DIAGNOSIS — E669 Obesity, unspecified: Secondary | ICD-10-CM

## 2022-02-24 MED ORDER — ACCU-CHEK SOFTCLIX LANCETS MISC
12 refills | Status: AC
  Filled 2022-02-24: qty 100, 30d supply, fill #0

## 2022-02-24 MED ORDER — ACCU-CHEK GUIDE W/DEVICE KIT
PACK | 0 refills | Status: AC
  Filled 2022-02-24: qty 1, 30d supply, fill #0

## 2022-02-24 MED ORDER — HYDRALAZINE HCL 10 MG PO TABS
25.0000 mg | ORAL_TABLET | Freq: Once | ORAL | Status: AC
  Administered 2022-02-24: 25 mg via ORAL

## 2022-02-24 MED ORDER — CARVEDILOL 25 MG PO TABS
25.0000 mg | ORAL_TABLET | Freq: Two times a day (BID) | ORAL | 2 refills | Status: DC
  Filled 2022-02-24: qty 120, 60d supply, fill #0
  Filled 2022-05-04: qty 120, 60d supply, fill #1

## 2022-02-24 MED ORDER — BLOOD PRESSURE KIT DEVI: 0 refills | Status: AC

## 2022-02-24 MED ORDER — LEVETIRACETAM ER 500 MG PO TB24
500.0000 mg | ORAL_TABLET | Freq: Three times a day (TID) | ORAL | 3 refills | Status: DC
  Filled 2022-02-24 – 2022-05-04 (×2): qty 270, 90d supply, fill #0

## 2022-02-24 MED ORDER — AMLODIPINE BESYLATE 10 MG PO TABS
10.0000 mg | ORAL_TABLET | Freq: Every day | ORAL | 3 refills | Status: DC
  Filled 2022-02-24 – 2022-05-04 (×2): qty 90, 90d supply, fill #0

## 2022-02-24 MED ORDER — ACCU-CHEK GUIDE VI STRP
ORAL_STRIP | 12 refills | Status: AC
  Filled 2022-02-24: qty 50, 25d supply, fill #0

## 2022-02-24 MED ORDER — SPIRONOLACTONE 25 MG PO TABS
25.0000 mg | ORAL_TABLET | Freq: Every day | ORAL | 3 refills | Status: DC
  Filled 2022-02-24: qty 60, 60d supply, fill #0
  Filled 2022-05-04: qty 60, 60d supply, fill #1

## 2022-02-24 MED ORDER — HYDRALAZINE HCL 50 MG PO TABS
50.0000 mg | ORAL_TABLET | Freq: Three times a day (TID) | ORAL | 6 refills | Status: DC
  Filled 2022-02-24 (×2): qty 90, 30d supply, fill #0
  Filled 2022-05-04: qty 90, 30d supply, fill #1

## 2022-02-24 MED ORDER — VITAMIN D (ERGOCALCIFEROL) 1.25 MG (50000 UNIT) PO CAPS
50000.0000 [IU] | ORAL_CAPSULE | ORAL | 3 refills | Status: DC
  Filled 2022-02-24 – 2022-05-04 (×2): qty 12, 84d supply, fill #0

## 2022-02-24 MED ORDER — RETIN-A 0.05 % EX CREA
TOPICAL_CREAM | Freq: Every day | CUTANEOUS | 2 refills | Status: DC
  Filled 2022-02-24: qty 45, 30d supply, fill #0
  Filled 2023-02-19 – 2023-02-22 (×3): qty 45, 30d supply, fill #1

## 2022-02-24 MED ORDER — METFORMIN HCL 500 MG PO TABS
500.0000 mg | ORAL_TABLET | Freq: Every day | ORAL | 2 refills | Status: DC
  Filled 2022-02-24 – 2022-05-04 (×2): qty 90, 90d supply, fill #0

## 2022-02-24 NOTE — Assessment & Plan Note (Signed)
No active seizures continue Keppra

## 2022-02-24 NOTE — Patient Instructions (Signed)
Increase carvedilol to 25 mg twice daily if they give you the 12.5 mg you will take 2 twice daily future refills will be 1 twice daily  No change in other medications all medicines refilled sent to pharmacy downstairs  I asked [facility] to mail you an [employer] blood pressure meter please check your blood pressure twice a day write the numbers down and bring it with you when you see [name] on 19 February then return to see Dr. Joya Gaskins in 2 months

## 2022-02-24 NOTE — Assessment & Plan Note (Signed)
Patient was given lifestyle medicine approach and education The following Lifestyle Medicine recommendations according to [facility]) were discussed and offered to patient who agrees to start the journey:  A. Whole Foods, Plant-based plate comprising of fruits and vegetables, plant-based proteins, whole-grain carbohydrates was discussed in detail with the patient.   A list for source of those nutrients were also provided to the patient.  Patient will use only water or unsweetened tea for hydration. B.  The need to stay away from risky substances including alcohol, smoking; obtaining 7 to 9 hours of restorative sleep, at least 150 minutes of moderate intensity exercise weekly, the importance of healthy social connections,  and stress reduction techniques were discussed. C.  A full color page of  Calorie density of various food groups per pound showing examples of each food groups was provided to the patient.

## 2022-02-24 NOTE — Assessment & Plan Note (Addendum)
Diabetes currently controlled with hemoglobin A1c of 6 we will continue to monitor with current medication Foot exam was normal

## 2022-02-24 NOTE — Assessment & Plan Note (Addendum)
Plan for this patient will be to increase carvedilol to 25 mg twice daily she will continue amlodipine hydralazine Aldactone as it currently is dosed.  Patient may yet need an ARB  Check renal panel this visit  Return short-term follow-up with clinical pharmacy then Dr. Joya Gaskins short-term after that visit  The following Lifestyle Medicine recommendations according to [place] of Lifestyle Medicine [facility]) were discussed and offered to patient who agrees to start the journey:  A. Whole Foods, Plant-based plate comprising of fruits and vegetables, plant-based proteins, whole-grain carbohydrates was discussed in detail with the patient.   A list for source of those nutrients were also provided to the patient.  Patient will use only water or unsweetened tea for hydration. B.  The need to stay away from risky substances including alcohol, smoking; obtaining 7 to 9 hours of restorative sleep, at least 150 minutes of moderate intensity exercise weekly, the importance of healthy social connections,  and stress reduction techniques were discussed. C.  A full color page of  Calorie density of various food groups per pound showing examples of each food groups was provided to the patient.

## 2022-02-25 ENCOUNTER — Telehealth: Payer: Self-pay

## 2022-02-25 ENCOUNTER — Other Ambulatory Visit: Payer: Self-pay

## 2022-02-25 LAB — RENAL FUNCTION PANEL
Albumin: 4.2 g/dL (ref 3.9–4.9)
BUN/Creatinine Ratio: 9 — ABNORMAL LOW (ref 12–28)
BUN: 7 mg/dL — ABNORMAL LOW (ref 8–27)
CO2: 24 mmol/L (ref 20–29)
Calcium: 9.6 mg/dL (ref 8.7–10.3)
Chloride: 104 mmol/L (ref 96–106)
Creatinine, Ser: 0.79 mg/dL (ref 0.57–1.00)
Glucose: 86 mg/dL (ref 70–99)
Phosphorus: 3.6 mg/dL (ref 3.0–4.3)
Potassium: 3.9 mmol/L (ref 3.5–5.2)
Sodium: 143 mmol/L (ref 134–144)
eGFR: 83 mL/min/{1.73_m2} (ref >59)

## 2022-02-25 LAB — VITAMIN D 25 HYDROXY (VIT D DEFICIENCY, FRACTURES): Vit D, 25-Hydroxy: 30.7 ng/mL (ref 30.0–100.0)

## 2022-02-25 LAB — HEMOGLOBIN A1C
Est. average glucose Bld gHb Est-mCnc: 126 mg/dL
Hgb A1c MFr Bld: 6 % — ABNORMAL HIGH (ref 4.8–5.6)

## 2022-02-25 NOTE — Progress Notes (Signed)
Let pt know liver and kidney normal,  A1C is normal diabetes is under control stay on metformin

## 2022-02-25 NOTE — Telephone Encounter (Signed)
-----   Message from [name], MD sent at 02/25/2022  6:12 AM EST ----- Let pt know liver and kidney normal,  A1C is normal diabetes is under control stay on metformin

## 2022-02-25 NOTE — Telephone Encounter (Signed)
Pt was called and is aware of results, DOB was confirmed.  ?

## 2022-03-02 ENCOUNTER — Encounter (HOSPITAL_COMMUNITY): Payer: Self-pay | Admitting: Licensed Clinical Social Worker

## 2022-03-02 ENCOUNTER — Ambulatory Visit (INDEPENDENT_AMBULATORY_CARE_PROVIDER_SITE_OTHER): Payer: Medicaid Other | Admitting: Licensed Clinical Social Worker

## 2022-03-02 DIAGNOSIS — F331 Major depressive disorder, recurrent, moderate: Secondary | ICD-10-CM | POA: Diagnosis not present

## 2022-03-02 NOTE — Progress Notes (Signed)
Virtual Visit via Telephone Note  I connected with [name] on 1/82023 at 1:00 am by phone enabled telehealth application and verified that I am speaking withthe correct person using two identifiers. Patient does not have a computer or Internet.   I discussed the limitations of evaluation and management by telemedicine and the availability of in person appointments. The patient expressed understanding and agreed to proceed.  LOCATION:  Patient: Home Provider: Home Office  Treatment Goal Addressed: Develop the ability to recognize, accept, and cope with feelings of depression AEB self report.  Progress Towards Goal: Progressing   History of Present Illness: Pt was referred for OP therapy by her neurologist Dr. Charlotte Crumb for depression.   Observations/Objective: Patient presented for today's session on time and was alert, oriented x5, with no evidence or self-report of SI/HI or A/V H.  Patient reported ongoing compliance with medication and denied any use of alcohol or illicit substances. Clinician inquired about patient's current emotional ratings, as well as any significant changes in thoughts, feelings or behaviors. Patient visited relatives over the holidays but is home now and settling in to being home.Since being home she saw her PCP and her blood pressure was elevated. Cln asked open ended questions.   CBT interventions can increase coping with difficult disorders, reduce negative emotions that exacerbate medical problems, I.e. high blood pressure. Cln inquired about reasons her bp has increased exponentially - Stress. Cln and pt reviewed coping skills for stress, practicing in session.    Collaboration of Care: Other: Continue to see Dr. Doyne Keel and Dr. Charlotte Crumb.  Patient/Guardian was advised Release of Information must be obtained prior to any record release in order to collaborate their care with an outside provider. Patient/Guardian was advised if they have not already done so to  contact the registration department to sign all necessary forms in order for [place] to release information regarding their care.   Consent: Patient/Guardian gives verbal consent for treatment and assignment of benefits for services provided during this visit. Patient/Guardian expressed understanding and agreed to proceed.      Assessment and plan: Counselor will continue to meet with patient to address treatment plan goals. Patient will continue to follow recommendations of providers and implement skills learned in session.   Follow Up Instructions:   I discussed the assessment and treatment plan with the patient. The patient was provided an opportunity to ask questions and all were answered. The patient agreed with the plan and demonstrated an understanding of the instructions.   The patient was advised to call back or seek an in-person evaluation if the symptoms worsen or if the condition fails to improve as anticipated.  I provided 45 minutes of non-face-to-face time during this encounter.   Braysen Cloward S, LCAS

## 2022-03-10 ENCOUNTER — Ambulatory Visit: Payer: Medicaid Other | Attending: Critical Care Medicine | Admitting: Pharmacist

## 2022-03-10 ENCOUNTER — Other Ambulatory Visit: Payer: Self-pay

## 2022-03-10 ENCOUNTER — Encounter: Payer: Self-pay | Admitting: Pharmacist

## 2022-03-10 VITALS — BP 126/77 | HR 58

## 2022-03-10 DIAGNOSIS — I10 Essential (primary) hypertension: Secondary | ICD-10-CM

## 2022-03-10 NOTE — Progress Notes (Signed)
   S:    No chief complaint on file.  67 y.o. female who presents for hypertension evaluation, education, and management.  PMH is significant for HTN, T2DM, seizures, MDD.  Patient was referred and last seen by Primary Care Provider, Dr. Joya Gaskins, on 02/24/22.   At last visit, blood pressure was elevated at 216/104. Increased carvedilol to 25 mg BID.  Today, patient arrives in good spirits and presents without assistance. Denies dizziness, headache, blurred vision, swelling.   Patient reports hypertension was diagnosed in 2008.   Family history: HTN, heart disease, stroke Social history: No tobacco use history.  Medication adherence appears appropriate . Patient has taken BP medications today.   Current antihypertensives include: amlodipine 10 mg daily, carvedilol 25 mg BID, hydralazine 50 mg TID, spironolactone 25 mg daily  Antihypertensives tried in the past include: lisinopril (cough) and losartan (heart racing)  Patient reported dietary habits: Endorses restricting salt in her diet and cooking at home.  Patient-reported exercise habits: Has not been able to exercise.  ASCVD risk factors include: T2DM, HTN, family history  O:  Patient has a Government social research officer.  Vitals:   03/10/22 0910  BP: 126/77  Pulse: (!) 58   Last 3 Office BP readings: BP Readings from Last 3 Encounters:  03/10/22 126/77  02/24/22 (!) 216/104  11/04/21 (!) 148/82    BMET    Component Value Date/Time   NA 143 02/24/2022 1010   K 3.9 02/24/2022 1010   CL 104 02/24/2022 1010   CO2 24 02/24/2022 1010   GLUCOSE 86 02/24/2022 1010   GLUCOSE 86 02/18/2016 0948   BUN 7 (L) 02/24/2022 1010   CREATININE 0.79 02/24/2022 1010   CREATININE 0.89 02/18/2016 0948   CALCIUM 9.6 02/24/2022 1010   GFRNONAA 60 12/26/2019 0949   GFRNONAA 73 08/17/2014 1013   GFRAA 69 12/26/2019 0949   GFRAA 84 08/17/2014 1013    Renal function: CrCl cannot be calculated (Unknown ideal weight.).  Clinical ASCVD: No   The 10-year ASCVD risk score (Arnett DK, et al., 2019) is: 14.6%   Values used to calculate the score:     Age: 52 years     Sex: Female     Is Non-Hispanic African American: Yes     Diabetic: Yes     Tobacco smoker: No     Systolic Blood Pressure: 123XX123 mmHg     Is BP treated: Yes     HDL Cholesterol: 49 mg/dL     Total Cholesterol: 144 mg/dL  Patient is participating in a Managed Medicaid Plan:  Yes   A/P: Hypertension diagnosed in 2008 currently controlled on current medications. BP goal < 130/80 mmHg. Medication adherence appears appropriate. -Continued amlodipine 10 mg daily.  -Continued spironolactone 25 mg daily.  - Continued carvedilol 25 mg BID. - Continued hydralazine 50 mg TID. -Counseled on lifestyle modifications for blood pressure control including reduced dietary sodium, increased exercise, adequate sleep. -Encouraged patient to check BP at home and bring log of readings to next visit. Counseled on proper use of home BP cuff.   Results reviewed and written information provided.    Written patient instructions provided. Patient verbalized understanding of treatment plan.  Total time in face to face counseling 30 minutes.    Follow-up:  Pharmacist in 4-6 weeks. PCP clinic visit on 05/05/2022.   Patient seen with Lillard Anes PharmD candidate co [address] [facility]  Benard Halsted, PharmD, [place], [place] [phone]

## 2022-03-10 NOTE — Patient Instructions (Signed)
Thank you for coming to see [place] today.   Blood pressure today is well-controlled! Great job!  Continue taking blood pressure medications as prescribed.   Limiting salt and caffeine, as well as exercising as able for at least 30 minutes for 5 days out of the week, can also help you lower your blood pressure.  Take your blood pressure at home if you are able. Please write down these numbers and bring them to your visits.  If you have any questions about medications, please call me [phone].  Lurena Joiner

## 2022-03-15 ENCOUNTER — Encounter (HOSPITAL_COMMUNITY): Payer: Self-pay | Admitting: Licensed Clinical Social Worker

## 2022-03-15 NOTE — Progress Notes (Signed)
Virtual Visit via Telephone Note  I connected with [name] on 02/09/22 at 1:00 am by phone enabled telehealth application and verified that I am speaking withthe correct person using two identifiers. Patient does not have a computer or Internet.   I discussed the limitations of evaluation and management by telemedicine and the availability of in person appointments. The patient expressed understanding and agreed to proceed.  LOCATION:  Patient: Home Provider: Home Office  Treatment Goal Addressed: Develop the ability to recognize, accept, and cope with feelings of depression AEB self report.  Progress Towards Goal: Progressing   History of Present Illness: Pt was referred for OP therapy by her neurologist Dr. Charlotte Crumb for depression.   Observations/Objective: Patient presented for today's session on time and was alert, oriented x5, with no evidence or self-report of SI/HI or A/V H.  Patient reported ongoing compliance with medication and denied any use of alcohol or illicit substances. Clinician inquired about patient's current emotional ratings, as well as any significant changes in thoughts, feelings or behaviors. Patient reported scores of 2/10 for depression, 3/10 for anxiety, 0/10 for anger/irritability, and denied any reoccurrence of panic attacks. Pt reports she went to See her daughter and grandchildren in [place] over the holidays, which was stressful. Cln and pt reviewed coping skills that worked. Cln and pt reviewed mindfulness based skills and practiced in session. Clinician congratulated patient on coping skills used and tolerating the holidays with family.     Collaboration of Care: Other: Continue to see Dr. Doyne Keel and Dr. Charlotte Crumb.  Patient/Guardian was advised Release of Information must be obtained prior to any record release in order to collaborate their care with an outside provider. Patient/Guardian was advised if they have not already done so to contact the  registration department to sign all necessary forms in order for [place] to release information regarding their care.   Consent: Patient/Guardian gives verbal consent for treatment and assignment of benefits for services provided during this visit. Patient/Guardian expressed understanding and agreed to proceed.      Assessment and plan: Counselor will continue to meet with patient to address treatment plan goals. Patient will continue to follow recommendations of providers and implement skills learned in session.   Follow Up Instructions:   I discussed the assessment and treatment plan with the patient. The patient was provided an opportunity to ask questions and all were answered. The patient agreed with the plan and demonstrated an understanding of the instructions.   The patient was advised to call back or seek an in-person evaluation if the symptoms worsen or if the condition fails to improve as anticipated.  I provided 45 minutes of non-face-to-face time during this encounter.   Thompson Mckim S, LCAS

## 2022-03-16 ENCOUNTER — Ambulatory Visit (HOSPITAL_COMMUNITY): Payer: Medicaid Other | Admitting: Licensed Clinical Social Worker

## 2022-03-17 ENCOUNTER — Ambulatory Visit (HOSPITAL_COMMUNITY): Payer: Medicaid Other | Admitting: Licensed Clinical Social Worker

## 2022-03-18 ENCOUNTER — Other Ambulatory Visit: Payer: Self-pay | Admitting: Critical Care Medicine

## 2022-03-18 DIAGNOSIS — Z1231 Encounter for screening mammogram for malignant neoplasm of breast: Secondary | ICD-10-CM

## 2022-03-24 ENCOUNTER — Ambulatory Visit (HOSPITAL_COMMUNITY): Payer: Medicaid Other | Admitting: Licensed Clinical Social Worker

## 2022-03-26 ENCOUNTER — Ambulatory Visit (HOSPITAL_COMMUNITY): Payer: Medicaid Other | Admitting: Psychiatry

## 2022-03-26 ENCOUNTER — Ambulatory Visit (HOSPITAL_COMMUNITY): Payer: Medicaid Other | Admitting: Licensed Clinical Social Worker

## 2022-03-31 ENCOUNTER — Ambulatory Visit (INDEPENDENT_AMBULATORY_CARE_PROVIDER_SITE_OTHER): Payer: Medicaid Other | Admitting: Licensed Clinical Social Worker

## 2022-03-31 ENCOUNTER — Encounter (HOSPITAL_COMMUNITY): Payer: Self-pay | Admitting: Licensed Clinical Social Worker

## 2022-03-31 DIAGNOSIS — F331 Major depressive disorder, recurrent, moderate: Secondary | ICD-10-CM

## 2022-03-31 NOTE — Progress Notes (Signed)
Comprehensive Clinical Assessment (CCA) Note  Virtual Visit via Video Note   I connected with [name] on 03/31/22 at 11:00 AM EDT by a video enabled telemedicine application and verified that I am speaking with the correct person using two identifiers.   Location: Patient: Home Provider: Home Office   I discussed the limitations of evaluation and management by telemedicine and the availability of in person appointments. The patient expressed understanding and agreed to proceed.         03/31/2022 [name] [id number]  Chief Complaint: No chief complaint on file.  Visit Diagnosis: MDD   CCA Screening, Triage and Referral (STR)  Patient Reported Information How did you hear about [place]? No data recorded Referral name: No data recorded Referral phone number: No data recorded  Whom do you see for routine medical problems? No data recorded Practice/Facility Name: No data recorded Practice/Facility Phone Number: No data recorded Name of Contact: No data recorded Contact Number: No data recorded Contact Fax Number: No data recorded Prescriber Name: No data recorded Prescriber Address (if known): No data recorded  What Is the Reason for Your Visit/Call Today? No data recorded How Long Has This Been Causing You Problems? No data recorded What Do You Feel Would Help You the Most Today? No data recorded  Have You Recently Been in Any Inpatient Treatment (Hospital/Detox/Crisis Center/28-Day Program)? No data recorded Name/Location of Program/Hospital:No data recorded How Long Were You There? No data recorded When Were You Discharged? No data recorded  Have You Ever Received Services From [facility] Before? No data recorded Who Do You See at [facility]? No data recorded  Have You Recently Had Any Thoughts About Hurting Yourself? No data recorded Are You Planning to Commit Suicide/Harm Yourself At This time? No data recorded  Have you Recently Had Thoughts About  [place]? No data recorded Explanation: No data recorded  Have You Used Any Alcohol or Drugs in the Past 24 Hours? No data recorded How Long Ago Did You Use Drugs or Alcohol? No data recorded What Did You Use and How Much? No data recorded  Do You Currently Have a Therapist/Psychiatrist? No data recorded Name of Therapist/Psychiatrist: No data recorded  Have You Been Recently Discharged From Any Office Practice or Programs? No data recorded Explanation of Discharge From Practice/Program: No data recorded    CCA Screening Triage Referral Assessment Type of Contact: No data recorded Is this Initial or Reassessment? No data recorded Date Telepsych consult ordered in [facility]:  No data recorded Time Telepsych consult ordered in [facility]:  No data recorded  Patient Reported Information Reviewed? No data recorded Patient Left Without Being Seen? No data recorded Reason for Not Completing Assessment: No data recorded  Collateral Involvement: No data recorded  Does Patient Have a [place]? No data recorded Name and Contact of Legal Guardian: No data recorded If Minor and Not Living with Parent(s), Who has Custody? No data recorded Is CPS involved or ever been involved? No data recorded Is APS involved or ever been involved? No data recorded  Patient Determined To Be At Risk for Harm To Self or Others Based on Review of Patient Reported Information or Presenting Complaint? No data recorded Method: No data recorded Availability of Means: No data recorded Intent: No data recorded Notification Required: No data recorded Additional Information for Danger to Others Potential: No data recorded Additional Comments for Danger to Others Potential: No data recorded Are There Guns or Other Weapons in Your  Home? No data recorded Types of Guns/Weapons: No data recorded Are These Weapons Safely Secured?                            No data recorded Who Could Verify You Are  Able To Have These Secured: No data recorded Do You Have any Outstanding Charges, Pending Court Dates, Parole/Probation? No data recorded Contacted To Inform of Risk of Harm To Self or Others: No data recorded  Location of Assessment: No data recorded  Does Patient Present under Involuntary Commitment? No data recorded IVC Papers Initial File Date: No data recorded  [place] of Residence: No data recorded  Patient Currently Receiving the Following Services: No data recorded  Determination of Need: No data recorded  Options For Referral: No data recorded    CCA Biopsychosocial Intake/Chief Complaint:  Patient continues to have stressors in her life and experiences anxiety and depression  Current Symptoms/Problems: anxious and depressed   Patient Reported Schizophrenia/Schizoaffective Diagnosis in Past: No   Strengths: motivated, family support  Preferences: prefers to not have these symptoms  Abilities: ability to work through her symptoms   Type of Services Patient Feels are Needed: OP services, medication management   Initial Clinical Notes/Concerns: No data recorded  Mental Health Symptoms Depression:   Change in energy/activity; Fatigue; Irritability   Duration of Depressive symptoms:  Greater than two weeks   Mania:   None   Anxiety:    Fatigue; Irritability; Worrying   Psychosis:   None   Duration of Psychotic symptoms: No data recorded  Trauma:   Avoids reminders of event; Guilt/shame   Obsessions:   Intrusive/time consuming   Compulsions:   Repeated behaviors/mental acts   Inattention:   N/A   Hyperactivity/Impulsivity:   N/A   Oppositional/Defiant Behaviors:   N/A   Emotional Irregularity:   N/A   Other Mood/Personality Symptoms:  No data recorded   Mental Status Exam Appearance and self-care  Stature:   Average   Weight:   Average weight   Clothing:   Casual   Grooming:   Normal   Cosmetic use:   None    Posture/gait:   Normal   Motor activity:   Not Remarkable   Sensorium  Attention:   Normal   Concentration:   Anxiety interferes   Orientation:   X5   Recall/memory:   Normal   Affect and Mood  Affect:   Depressed   Mood:   Depressed   Relating  Eye contact:   Normal   Facial expression:   Responsive   Attitude toward examiner:   Cooperative   Thought and Language  Speech flow:  Normal   Thought content:   Appropriate to Mood and Circumstances   Preoccupation:   Ruminations   Hallucinations:   None   Organization:  No data recorded  [employer] of Knowledge:   Impoverished by (Comment)   Intelligence:   Below average   Abstraction:   Normal   Judgement:   Normal   Reality Testing:   Adequate   Insight:   Fair   Decision Making:   Normal   Social Functioning  Social Maturity:   Responsible   Social Judgement:   Naive   Stress  Stressors:   Family conflict; Financial   Coping Ability:   Deficient supports   Skill Deficits:   Intellect/education   Supports:   Family     Religion: Religion/Spirituality Are  You A Religious Person?: Yes What is Your Religious Affiliation?: Non-Denominational  Leisure/Recreation: Leisure / Recreation Do You Have Hobbies?: No  Exercise/Diet: Exercise/Diet Do You Exercise?: Yes What Type of Exercise Do You Do?: Run/Walk How Many Times a Week Do You Exercise?: 1-3 times a week Have You Gained or Lost A Significant Amount of Weight in the Past Six Months?: No Do You Follow a Special Diet?: No Do You Have Any Trouble Sleeping?: No   CCA Employment/Education Employment/Work Situation: Employment / Work Technical sales engineer: On disability Why is Patient on Disability: seizures and mental illness How Long has Patient Been on Disability: 7 years Patient's Job has Been Impacted by Current Illness: No What is the Longest Time Patient has Held a Job?: pt has  never held a job due to her seizures Has Patient ever Been in the [employer]?: No  Education: Education Is Patient Currently Attending School?: No Last Grade Completed: 12 Did Teacher, adult education From [employer]?: Yes Did Physicist, medical?: No Did You Attend Graduate School?: No Did You Have An Individualized Education Program (IIEP): Yes Did You Have Any Difficulty At School?: Yes   CCA Family/Childhood History Family and Relationship History: Family history Are you sexually active?: No Does patient have children?: Yes How many children?: 1 How is patient's relationship with their children?: patient's daughter causes her stress. she and her 3 children live in [place]  Childhood History:  Childhood History By whom was/is the patient raised?: Both parents Additional childhood history information: both parents, every summer we were sent to [place] to stay with grandmother until age 28 Description of patient's relationship with caregiver when they were a child: good relationship with both parents Patient's description of current relationship with people who raised him/her: deceased How were you disciplined when you got in trouble as a child/adolescent?: whipping Does patient have siblings?: Yes Number of Siblings: 3 Description of patient's current relationship with siblings: excellent relationship with sister who lives in [place], brother addict Did patient suffer any verbal/emotional/physical/sexual abuse as a child?: No Did patient suffer from severe childhood neglect?: No Has patient ever been sexually abused/assaulted/raped as an adolescent or adult?: No Was the patient ever a victim of a crime or a disaster?: No Witnessed domestic violence?: No Has patient been affected by domestic violence as an adult?: No  Child/Adolescent Assessment:     CCA Substance Use Alcohol/Drug Use: Alcohol / Drug Use History of alcohol / drug use?: No history of alcohol / drug abuse                          ASAM's:  Six Dimensions of Multidimensional Assessment  Dimension 1:  Acute Intoxication and/or Withdrawal Potential:      Dimension 2:  Biomedical Conditions and Complications:      Dimension 3:  Emotional, Behavioral, or Cognitive Conditions and Complications:     Dimension 4:  Readiness to Change:     Dimension 5:  Relapse, Continued use, or Continued Problem Potential:     Dimension 6:  Recovery/Living Environment:     ASAM Severity Score:    ASAM Recommended Level of Treatment:     Substance use Disorder (SUD)    Recommendations for Services/Supports/Treatments: Recommendations for Services/Supports/Treatments Recommendations For Services/Supports/Treatments: Individual Therapy, Medication Management  DSM5 Diagnoses: Patient Active Problem List   Diagnosis Date Noted   Diabetic mononeuropathy associated with type 2 diabetes mellitus ([place]) 02/24/2022   Controlled type 2 diabetes mellitus  without complication, without long-term current use of insulin ([place]) 02/24/2022   Influenza vaccine refused 12/26/2019   Adjustment disorder with anxious mood 08/18/2018   Obesity (BMI 30-39.9) 09/17/2016   Internal hemorrhoid 09/16/2016   Adjustment disorder with mixed anxiety and depressed mood 12/04/2015   Primary insomnia 12/04/2015   Seizures ([place]) 08/24/2013   Class 2 severe obesity due to excess calories with serious comorbidity and body mass index (BMI) of 38.0 to 38.9 in adult [facility])    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   PES PLANUS, RIGHT 07/20/2007   Acne 07/09/2006   Major depressive disorder, recurrent episode, moderate ([place]) 03/18/2006   Essential hypertension 03/18/2006   PSORIASIS 03/18/2006    Patient Centered Plan: Patient is on the following Treatment Plan(s):  Depression   Referrals to Alternative Service(s): Referred to Alternative Service(s):   Place:   Date:   Time:    Referred to Alternative Service(s):   Place:   Date:   Time:     Referred to Alternative Service(s):   Place:   Date:   Time:    Referred to Alternative Service(s):   Place:   Date:   Time:      Collaboration of Care: Other: continue to see her providers  Patient/Guardian was advised Release of Information must be obtained prior to any record release in order to collaborate their care with an outside provider. Patient/Guardian was advised if they have not already done so to contact the registration department to sign all necessary forms in order for [place] to release information regarding their care.   Consent: Patient/Guardian gives verbal consent for treatment and assignment of benefits for services provided during this visit. Patient/Guardian expressed understanding and agreed to proceed.  I provided 45 minutes of non-face-to-face time during this encounter.    Parissa Chiao S, LCAS

## 2022-04-02 ENCOUNTER — Ambulatory Visit (HOSPITAL_COMMUNITY): Payer: Medicaid Other | Admitting: Psychiatry

## 2022-04-02 ENCOUNTER — Other Ambulatory Visit (HOSPITAL_COMMUNITY): Payer: Self-pay | Admitting: Psychiatry

## 2022-04-02 ENCOUNTER — Other Ambulatory Visit: Payer: Self-pay

## 2022-04-02 DIAGNOSIS — F5101 Primary insomnia: Secondary | ICD-10-CM

## 2022-04-02 DIAGNOSIS — F331 Major depressive disorder, recurrent, moderate: Secondary | ICD-10-CM

## 2022-04-02 MED ORDER — MIRTAZAPINE 30 MG PO TABS
30.0000 mg | ORAL_TABLET | Freq: Every day | ORAL | 0 refills | Status: DC
  Filled 2022-04-02: qty 30, 30d supply, fill #0

## 2022-04-07 ENCOUNTER — Encounter (HOSPITAL_COMMUNITY): Payer: Self-pay | Admitting: Licensed Clinical Social Worker

## 2022-04-07 ENCOUNTER — Ambulatory Visit (INDEPENDENT_AMBULATORY_CARE_PROVIDER_SITE_OTHER): Payer: Medicaid Other | Admitting: Licensed Clinical Social Worker

## 2022-04-07 DIAGNOSIS — F331 Major depressive disorder, recurrent, moderate: Secondary | ICD-10-CM

## 2022-04-07 NOTE — Progress Notes (Signed)
Virtual Visit via Telephone Note  I connected with [name] on 04/07/2022 at 10:00 am by phone enabled telehealth application and verified that I am speaking withthe correct person using two identifiers. Patient does not have a computer or Internet.   I discussed the limitations of evaluation and management by telemedicine and the availability of in person appointments. The patient expressed understanding and agreed to proceed.  LOCATION:  Patient: Home Provider: Home Office  Treatment Goal Addressed: Develop the ability to recognize, accept, and cope with feelings of depression AEB self report.  Progress Towards Goal: Progressing   History of Present Illness: Pt was referred for OP therapy by her neurologist Dr. Charlotte Crumb for depression.   Observations/Objective: Patient presented for today's session on time and was alert, oriented x5, with no evidence or self-report of SI/HI or A/V H.  Patient reported ongoing compliance with medication and denied any use of alcohol or illicit substances. Clinician inquired about patient's current emotional ratings, as well as any significant changes in thoughts, feelings or behaviors. Pt reports on her family which causes her stress. Cln used CBT to assist in identifying positives to assess how her mood has been impacted. Clinician allowed space for patient to further process emotions and clinician provided supportive statements throughout session. It was suggested to patient to continue to identify positives that  impact mood daily. Cln provided education on how to to recognize the physical, cognitive, emotional, and behavioral responses that influence her mood.  Collaboration of Care: Other: Continue to see Dr. Doyne Keel and Dr. Charlotte Crumb.  Patient/Guardian was advised Release of Information must be obtained prior to any record release in order to collaborate their care with an outside provider. Patient/Guardian was advised if they have not already done so  to contact the registration department to sign all necessary forms in order for [place] to release information regarding their care.   Consent: Patient/Guardian gives verbal consent for treatment and assignment of benefits for services provided during this visit. Patient/Guardian expressed understanding and agreed to proceed.      Assessment and plan: Counselor will continue to meet with patient to address treatment plan goals. Patient will continue to follow recommendations of providers and implement skills learned in session.   Follow Up Instructions:   I discussed the assessment and treatment plan with the patient. The patient was provided an opportunity to ask questions and all were answered. The patient agreed with the plan and demonstrated an understanding of the instructions.   The patient was advised to call back or seek an in-person evaluation if the symptoms worsen or if the condition fails to improve as anticipated.  I provided 45 minutes of non-face-to-face time during this encounter.   Draya Felker S, LCAS

## 2022-04-14 ENCOUNTER — Encounter: Payer: Self-pay | Admitting: Pharmacist

## 2022-04-14 ENCOUNTER — Ambulatory Visit: Payer: Medicaid Other | Admitting: Podiatry

## 2022-04-14 ENCOUNTER — Ambulatory Visit: Payer: Medicaid Other | Attending: Family Medicine | Admitting: Pharmacist

## 2022-04-14 VITALS — BP 175/89 | HR 54

## 2022-04-14 DIAGNOSIS — I10 Essential (primary) hypertension: Secondary | ICD-10-CM | POA: Diagnosis not present

## 2022-04-14 DIAGNOSIS — E119 Type 2 diabetes mellitus without complications: Secondary | ICD-10-CM | POA: Diagnosis not present

## 2022-04-14 DIAGNOSIS — M79675 Pain in left toe(s): Secondary | ICD-10-CM

## 2022-04-14 DIAGNOSIS — B351 Tinea unguium: Secondary | ICD-10-CM

## 2022-04-14 DIAGNOSIS — M79674 Pain in right toe(s): Secondary | ICD-10-CM | POA: Diagnosis not present

## 2022-04-14 NOTE — Progress Notes (Signed)
S:    No chief complaint on file.  67 y.o. female who presents for hypertension evaluation, education, and management.  PMH is significant for HTN, T2DM, seizures, MDD.  Patient was referred and last seen by Primary Care Provider, Dr. Joya Gaskins, on 02/24/22. We saw her last month and BP was at goal.  Today, patient arrives in good spirits and presents without assistance. Denies dizziness, headache, blurred vision, swelling. She does tell me she has a couple of visits today and did not sleep well last night in anticipation of her visits today. Additionally, she ate some chicken soup for her breakfast this morning.   Family history: HTN, heart disease, stroke Social history: No tobacco use history.  Medication adherence appears appropriate, however, she tells me she missed her morning doses of medication this Saturday. Patient has taken BP medications today. She restricts sodium but tells me she ate chicken soup this morning for breakfast.   Current antihypertensives include: amlodipine 10 mg daily, carvedilol 25 mg BID, hydralazine 50 mg TID, spironolactone 25 mg daily  Antihypertensives tried in the past include: lisinopril (cough) and losartan (heart racing)  Patient reported dietary habits: Endorses restricting salt in her diet and cooking at home.  Patient-reported exercise habits:  -Since last visit, she is walking daily for about ~30 minutes daily (sometimes between 40-50 minutes depending on foot pain)  ASCVD risk factors include: T2DM, HTN, family history  O:  Patient has a Government social research officer.  Vitals:   04/14/22 0919  BP: (!) 175/89  Pulse: (!) 54   Last 3 Office BP readings: BP Readings from Last 3 Encounters:  04/14/22 (!) 175/89  03/10/22 126/77  02/24/22 (!) 216/104    BMET    Component Value Date/Time   NA 143 02/24/2022 1010   K 3.9 02/24/2022 1010   CL 104 02/24/2022 1010   CO2 24 02/24/2022 1010   GLUCOSE 86 02/24/2022 1010   GLUCOSE 86 02/18/2016  0948   BUN 7 (L) 02/24/2022 1010   CREATININE 0.79 02/24/2022 1010   CREATININE 0.89 02/18/2016 0948   CALCIUM 9.6 02/24/2022 1010   GFRNONAA 60 12/26/2019 0949   GFRNONAA 73 08/17/2014 1013   GFRAA 69 12/26/2019 0949   GFRAA 84 08/17/2014 1013    Renal function: CrCl cannot be calculated (Patient's most recent lab result is older than the maximum 21 days allowed.).  Clinical ASCVD: No  The 10-year ASCVD risk score (Arnett DK, et al., 2019) is: 30.6%   Values used to calculate the score:     Age: 60 years     Sex: Female     Is Non-Hispanic African American: Yes     Diabetic: Yes     Tobacco smoker: No     Systolic Blood Pressure: 0000000 mmHg     Is BP treated: Yes     HDL Cholesterol: 49 mg/dL     Total Cholesterol: 144 mg/dL  Patient is participating in a Managed Medicaid Plan:  Yes   A/P: Hypertension diagnosed in 2008 currently above goal on current medications. BP goal < 130/80 mmHg. Medication adherence appears appropriate. She feels anxious during her doctor visits and admits she did not sleep well last night. That + the sodium in her soup this morning could be the cause of her elevated BP today. Her BP last month was good.   I trended her BP reading in our office (both mine and Dr. Bettina Gavia visits since 09/2021): 09/25/21: 138/83 mmHg 10/21/21: 135/71 mmHg 11/04/21: 148/82  mmHg 02/24/22: 216/104 mmHg  03/10/22: 126/77 mmHg 04/14/22: 175/89 mmHg  It looks like her BP can vary quite a bit depending on her level of anxiety in clinic. I have no home values to compare these to. I will hold off on changes since her BP last visit was okay. However, she will Dr. Joya Gaskins in 3 weeks and we may need to consider additional changes at that time. Of note, K levels and kidney function looks good. She has intolerances to RAAS therapy but we can consider low dose valsartan or irbesartan at that visit. If she is resistant to this, I would recommend to increase spironolactone dose. Hydralazine TDD  doses >150mg  increases risk for side effects, particularly drug-induced lupus. I would advise other options before this one.    -Continued amlodipine 10 mg daily.  -Continued spironolactone 25 mg daily.  - Continued carvedilol 25 mg BID. - Continued hydralazine 50 mg TID. -Counseled on lifestyle modifications for blood pressure control including reduced dietary sodium, increased exercise, adequate sleep. -Encouraged patient to check BP at home and bring log of readings to next visit. Counseled on proper use of home BP cuff.  -Recommend to try low-dose valsartan or increase spironolactone at next visit.   Results reviewed and written information provided.    Written patient instructions provided. Patient verbalized understanding of treatment plan.  Total time in face to face counseling 30 minutes.    Follow-up:  PCP clinic visit on 05/05/2022.   Benard Halsted, PharmD, [facility] [place], [place] [phone]

## 2022-04-16 ENCOUNTER — Ambulatory Visit (HOSPITAL_COMMUNITY): Payer: Medicaid Other | Admitting: Licensed Clinical Social Worker

## 2022-04-19 NOTE — Progress Notes (Signed)
Subjective: Chief Complaint  Patient presents with   routine foot care    67 year old female presents the office with concerns of thick, discolored toenails that she cannot trim herself and they are causing discomfort as well as for calluses.  She has no other concerns today.  No open lesions.  Kirsten Stain, MD Last seen February 24, 2022  A1c 6.0 on February 24, 2022  Objective: AAO x3, NAD DP/PT pulses palpable bilaterally, CRT less than 3 seconds Nails are hypertrophic, dystrophic, brittle, discolored, elongated 10. No surrounding redness or drainage. Tenderness nails 1-5 bilaterally. There is no hyperkeratotic lesion present right submetatarsal 1 as well as bilateral medial posterior foot and medial hallux bilaterally.  There is no underlying ulceration drainage or any signs of infection. There is a annular hyper pigmented lesion present on the right third toe consistent with a mole.  Still unchanged. No pain with calf compression, swelling, warmth, erythema  Assessment: Type 2 diabetes with neuropathy, symptomatic onychomycosis/hyperkeratotic lesions  Plan: -All treatment options discussed with the patient including all alternatives, risks, complications.  -Sharply debrided nails x10 without any complications or bleeding -Calluses were not significant today.  I debrided this lightly as a courtesy for her.  Recommend moisturizer and offloading.  As a courtesy I did debride some of them without any complications or bleeding otherwise not much to debride. -Discussed daily foot inspection -Patient encouraged to call the office with any questions, concerns, change in symptoms.   [name] DPM

## 2022-04-21 ENCOUNTER — Ambulatory Visit (INDEPENDENT_AMBULATORY_CARE_PROVIDER_SITE_OTHER): Payer: Medicaid Other | Admitting: Licensed Clinical Social Worker

## 2022-04-21 ENCOUNTER — Encounter (HOSPITAL_COMMUNITY): Payer: Self-pay | Admitting: Licensed Clinical Social Worker

## 2022-04-21 ENCOUNTER — Telehealth: Payer: Self-pay | Admitting: Pharmacist

## 2022-04-21 DIAGNOSIS — F331 Major depressive disorder, recurrent, moderate: Secondary | ICD-10-CM

## 2022-04-21 NOTE — Progress Notes (Signed)
Patient attempted to be outreached by Georga Kaufmann, PharmD Candidate on 04/21/22 to discuss hypertension. Left voicemail for patient to return our call at their convenience at [phone].   Georga Kaufmann, PharmD Candidate   Catie Hedwig Morton, PharmD, [place], [facility] [phone]

## 2022-04-23 ENCOUNTER — Other Ambulatory Visit: Payer: Self-pay

## 2022-04-23 ENCOUNTER — Ambulatory Visit (HOSPITAL_BASED_OUTPATIENT_CLINIC_OR_DEPARTMENT_OTHER): Payer: Medicaid Other | Admitting: Psychiatry

## 2022-04-23 DIAGNOSIS — F5101 Primary insomnia: Secondary | ICD-10-CM | POA: Diagnosis not present

## 2022-04-23 DIAGNOSIS — F331 Major depressive disorder, recurrent, moderate: Secondary | ICD-10-CM

## 2022-04-23 MED ORDER — MIRTAZAPINE 30 MG PO TABS
30.0000 mg | ORAL_TABLET | Freq: Every day | ORAL | 1 refills | Status: DC
  Filled 2022-04-23 – 2022-05-04 (×2): qty 90, 90d supply, fill #0

## 2022-04-23 NOTE — Progress Notes (Signed)
BH MD/PA/NP OP Progress Note  04/23/2022 6:13 PM [name]  MRN:  [id number]  Chief Complaint: No chief complaint on file.  HPI: "I'm hanging in there". [name] has been feeling more tired than usually. She has been craving baloney and ice cream. [name] denies depression, hopelessness and anhedonia. She has been walking about 40 minutes/day. She denies SI/HI. [name] has been avoiding certain things on TV due to it causing stress. Most nights [name] sleeps with the tv on. She usually gets from 10pm- 3am. Once she is up she prays and starts her day. She naps in the afternoon for 2-3 hours some days but tries not to because it messes up her nighttime sleep.     Visit Diagnosis:    ICD-10-CM   1. Major depressive disorder, recurrent episode, moderate  F33.1 mirtazapine (REMERON) 30 MG tablet    2. Primary insomnia  F51.01 mirtazapine (REMERON) 30 MG tablet      Past Psychiatric History: no updates per patient  Past Medical History:  Past Medical History:  Diagnosis Date   Anxiety    Carpal tunnel syndrome    Diabetes mellitus without complication    Diabetes mellitus, type II    DYSTONIA 01/04/2007   Qualifier: Diagnosis of  By: Zebedee Iba NP, Manuela Schwartz     Facial droop 10/09/2013   Hemorrhoid    Hypertension    Major depressive disorder, recurrent episode 03/18/2006   Qualifier: Diagnosis of  By: Beryle Lathe     Obesity, unspecified 10/17/2007   Qualifier: Diagnosis of  By: Jenne Campus PHD, Jeannie     Prediabetes 10/09/2013   Rectal bleeding 07/03/2015   Seizures    Seizures 08/24/2013    Past Surgical History:  Procedure Laterality Date   ABDOMINAL HYSTERECTOMY     COLONOSCOPY N/A 07/15/2015   Dr. Oneida Alar: small-mouth diverticula, small non-bleeding internal hemorrhoids, redundant colon    HERNIA REPAIR     MYOMECTOMY      Family Psychiatric and Medical  History:  Family History  Problem Relation Age of Onset   Asthma Mother    Hypertension Father    Heart disease Father    Stroke  Father    Cancer Sister        uterus cancer    Diabetes Maternal Uncle    Breast cancer Cousin    Colon cancer Neg Hx     Social History:  Social History   Socioeconomic History   Marital status: Divorced    Spouse name: Not on file   Number of children: 1   Years of education: Not on file   Highest education level: Not on file  Occupational History   Not on file  Tobacco Use   Smoking status: Never   Smokeless tobacco: Never  Vaping Use   Vaping Use: Never used  Substance and Sexual Activity   Alcohol use: No    Alcohol/week: 0.0 standard drinks of alcohol   Drug use: No   Sexual activity: Not Currently  Other Topics Concern   Not on file  Social History Narrative   Right handed    Lives alone    Lives in apartment  on 6th floor    No caffeine   Social Determinants of Health   Financial Resource Strain: Low Risk  (04/14/2022)   Overall Financial Resource Strain (CARDIA)    Difficulty of Paying Living Expenses: Not hard at all  Food Insecurity: Food Insecurity Present (04/14/2022)   Hunger Vital Sign    Worried About  Running Out of Food in the Last Year: Sometimes true    Ran Out of Food in the Last Year: Never true  Transportation Needs: No Transportation Needs (04/14/2022)   PRAPARE - Hydrologist (Medical): No    Lack of Transportation (Non-Medical): No  Physical Activity: Sufficiently Active (04/14/2022)   Exercise Vital Sign    Days of Exercise per Week: 5 days    Minutes of Exercise per Session: 30 min  Stress: Stress Concern Present (04/14/2022)   [place]    Feeling of Stress : To some extent  Social Connections: Socially Isolated (04/14/2022)   Social Connection and Isolation Panel [NHANES]    Frequency of Communication with Friends and Family: Twice a week    Frequency of Social Gatherings with Friends and Family: Twice a week    Attends Religious Services:  Never    Marine scientist or Organizations: No    Attends Archivist Meetings: Never    Marital Status: Never married    Allergies:  Allergies  Allergen Reactions   Codeine Anaphylaxis   Shellfish Allergy Swelling and Rash    Throat Sweling   Apple Cider Vinegar     syncope   Cinnamon Hives   Cozaar [Losartan]     Makes her feel like she is having a heart attack   Lisinopril     cough   Opium     Interferes with Keppra   Banana Rash    Cannot eat any while taking KEPPRA   Catfish [Fish Allergy] Rash   Orange Fruit [Citrus] Rash    Cannot have while taking KEPPRA   Tomato Itching and Rash    Metabolic Disorder Labs: Lab Results  Component Value Date   HGBA1C 6.0 (H) 02/24/2022   No results found for: "PROLACTIN" Lab Results  Component Value Date   CHOL 144 01/29/2021   TRIG 155 (H) 01/29/2021   HDL 49 01/29/2021   CHOLHDL 2.9 01/29/2021   VLDL 25 02/18/2016   LDLCALC 68 01/29/2021   LDLCALC 63 12/26/2019   Lab Results  Component Value Date   TSH 1.660 12/26/2019   TSH 2.510 05/02/2019    Therapeutic Level Labs: No results found for: "LITHIUM" No results found for: "VALPROATE" No results found for: "CBMZ"  Current Medications: Current Outpatient Medications  Medication Sig Dispense Refill   Accu-Chek Softclix Lancets lancets Use as instructed 100 each 12   amLODipine (NORVASC) 10 MG tablet TAKE 1 TABLET (10 MG TOTAL) BY MOUTH DAILY. 90 tablet 3   Blood Glucose Monitoring Suppl (ACCU-CHEK GUIDE) w/Device KIT Use to check blood sugar twice day 1 kit 0   Blood Pressure Monitoring (BLOOD PRESSURE KIT) DEVI Use to measure blood pressure 1 each 0   calcium carbonate (OS-CAL) 1250 (500 Ca) MG chewable tablet Chew 1 tablet by mouth daily.     carvedilol (COREG) 25 MG tablet Take 1 tablet (25 mg total) by mouth 2 (two) times daily with a meal. 120 tablet 2   glucose blood (ACCU-CHEK GUIDE) test strip Use as instructed 100 each 12   hydrALAZINE  (APRESOLINE) 50 MG tablet Take 1 tablet (50 mg total) by mouth 3 (three) times daily. 90 tablet 6   levETIRAcetam (KEPPRA XR) 500 MG 24 hr tablet Take 1 tablet by mouth three times a day 270 tablet 3   loratadine (CLARITIN) 10 MG tablet TAKE 1 TABLET (10 MG TOTAL) BY  MOUTH DAILY. 60 tablet 2   metFORMIN (GLUCOPHAGE) 500 MG tablet TAKE 1 TABLET (500 MG TOTAL) BY MOUTH DAILY WITH BREAKFAST. 90 tablet 2   spironolactone (ALDACTONE) 25 MG tablet Take 1 tablet (25 mg total) by mouth daily. 60 tablet 3   tretinoin (RETIN-A) 0.05 % cream Apply topically at bedtime. 45 g 2   Vitamin D, Ergocalciferol, (DRISDOL) 1.25 MG (50000 UNIT) CAPS capsule Take 1 capsule by mouth by mouth once a week. 12 capsule 3   mirtazapine (REMERON) 30 MG tablet Take 1 tablet (30 mg total) by mouth at bedtime. 90 tablet 1   No current facility-administered medications for this visit.     Musculoskeletal: Strength & Muscle Tone: within normal limits Gait & Station: normal Patient leans: N/A  Psychiatric Specialty Exam: Review of Systems  Blood pressure (!) 192/97, pulse 60, resp. rate 18, height 5\' 2"  (1.575 m), weight 201 lb (91.2 kg), SpO2 96 %.Body mass index is 36.76 kg/m.  General Appearance: Fairly Groomed and Neat  Eye Contact:  Good  Speech:  Clear and Coherent and Normal Rate  Volume:  Normal  Mood:  Euthymic  Affect:  Appropriate and Congruent  Thought Process:  Goal Directed, Linear, and Descriptions of Associations: Intact  Orientation:  Full (Time, Place, and Person)  Thought Content: Logical   Suicidal Thoughts:  No  Homicidal Thoughts:  No  Memory:  Immediate;   Good  Judgement:  Good  Insight:  Good  Psychomotor Activity:  Normal  Concentration:  Concentration: Good  Recall:  Good  Fund of Knowledge: Good  Language: Good  Akathisia:  No  Handed:  Right  AIMS (if indicated): not done  Assets:  Communication Skills Desire for Improvement Financial Resources/Insurance Housing Leisure  Time Resilience Social Support Talents/Skills Transportation Vocational/Educational  ADL's:  Intact  Cognition: WNL  Sleep:  Good   Screenings: GAD-7    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 [facility] [place] from 12/26/2019 in [facility] Office Visit from 03/07/2019 in [facility] Office Visit from 12/26/2018 in [facility] Office Visit from 08/02/2018 in [facility]  Total GAD-7 Score 9 0 0 0 7      PHQ2-9    [place] Office Visit from 04/14/2022 in [facility] Office Visit from 08/21/2021 in [facility] Visit from 06/19/2021 in [facility] Office Visit from 04/29/2021 in [facility] Video Visit from 04/10/2021 in [facility]  PHQ-2 Total Score 3 3 1  0 1  PHQ-9 Total Score 10 10 -- -- --      [place] Office Visit from 08/21/2021 in [facility] Office Visit from 06/19/2021 in [facility] Video Visit from 04/10/2021 in [facility] No Risk No Risk No Risk        Assessment and Plan:  Confidentiality and exclusions reviewed with pt who verbalized understanding.   Medication management with supportive therapy. Risks and benefits, side effects and alternative treatment options discussed with patient. Pt was given an opportunity to ask questions about medication, illness, and treatment. All current psychiatric medications have been reviewed and discussed with the patient and adjusted as clinically appropriate. The patient has been provided  an accurate and updated list  of the medications being now prescribed. Pt verbalized understanding and verbal consent obtained for treatment.  Pt is aware that these meds carry a teratogenic risk. Pt will discuss plan of action if she does or plans to become pregnant in the future.  Status of current problems: stable   Labs: reviewed labs 02/24/22 HbA1c 6  Therapy: brief supportive therapy provided. Discussed psychosocial stressors in detail.       F/up in 4 months or sooner if needed  The duration of this appointment visit was 15 minutes of face-to-face time with the patient.  Greater than 50% of this time was spent in counseling, explanation of  diagnosis, planning of further management, and coordination of care    Collaboration of Care: Collaboration of Care: Other none  Patient/Guardian was advised Release of Information must be obtained prior to any record release in order to collaborate their care with an outside provider. Patient/Guardian was advised if they have not already done so to contact the registration department to sign all necessary forms in order for [place] to release information regarding their care.   Consent: Patient/Guardian gives verbal consent for treatment and assignment of benefits for services provided during this visit. Patient/Guardian expressed understanding and agreed to proceed.    Charlcie Cradle, MD 04/23/2022, 6:13 PM

## 2022-04-28 ENCOUNTER — Ambulatory Visit: Payer: Medicaid Other | Admitting: Podiatry

## 2022-04-30 ENCOUNTER — Ambulatory Visit (INDEPENDENT_AMBULATORY_CARE_PROVIDER_SITE_OTHER): Payer: Medicaid Other | Admitting: Licensed Clinical Social Worker

## 2022-04-30 DIAGNOSIS — F331 Major depressive disorder, recurrent, moderate: Secondary | ICD-10-CM

## 2022-04-30 DIAGNOSIS — F339 Major depressive disorder, recurrent, unspecified: Secondary | ICD-10-CM | POA: Diagnosis not present

## 2022-05-04 ENCOUNTER — Other Ambulatory Visit: Payer: Self-pay

## 2022-05-04 ENCOUNTER — Ambulatory Visit
Admission: RE | Admit: 2022-05-04 | Discharge: 2022-05-04 | Disposition: A | Discharge status: Home / Self Care | Payer: Medicaid Other | Source: Ambulatory Visit | Attending: Critical Care Medicine | Admitting: Critical Care Medicine

## 2022-05-04 DIAGNOSIS — Z1231 Encounter for screening mammogram for malignant neoplasm of breast: Secondary | ICD-10-CM

## 2022-05-04 NOTE — Progress Notes (Deleted)
Established Patient Office Visit  Subjective   Patient ID: [name], female    DOB: 21-Apr-1955  Age: 67 y.o. MRN: [id number]  No chief complaint on file.     5/23 This is a 67 year old female last seen in January by PA Mcclung previously by Dr. Laural Benes for diabetes and hypertension.  She has more of a prediabetic condition but comes today with a blood pressure initially of 209/112 on recheck was 190/110.  She was in and out of her blood pressure medications for about a week.  She does not have a blood pressure meter at this time she lost the one she had.  She is supposed to be on hydralazine 50 mg twice a day amlodipine 10 mg a day and hydrochlorothiazide 25 mg a day.  She had normal readings last fall but these have increased since that she had lost her medication and lost her blood pressure meter.  Patient has carried a diagnosis of diabetes along a prediabetic range.  Last A1c 4 months ago was 6.0  Patient had an appointment in April with Dr. Laural Benes mid left without being seen when the office was busy for the patient to wait  This patient is also on Keppra for seizures  The patient states her mental health is good she has a counselor and mental health provider  Patient has been seizure-free  Patient does note a rash on the face.  Patient does have elevated cholesterol.  Primarily has triglycerides  10/3  Patient states since last visit she has done very well on arrival blood pressure is much improved 135/71.  She is looking for a new blood pressure meter and would like to get the [employer] she was given different locations to try to find 1.  She states her acne is improved.  She does have some foot pain and is followed by podiatry.  There are no other complaints.   Below is documentation from clinical pharmacy earlier last month Seen by [facility] CPP 09/25/2021 Hypertension longstanding currently close to goal on current medications. BP goal < 130/80 mmHg. Medication adherence appears  optimal. Given her frequency of dizzy spells, BP close to goal, and that she does not have a working BP cuff at this time, no changes were made to her home regimen.  -Continued amlodipine 10mg  once daily at bedtime -Continued carvedilol 12.5mg  BID -Continued hydralazine 50mg  TID -Continued spironolactone 25mg  once daily.  -Encouraged her to take her BP at home, especially when she experiences dizziness and bring a log of the readings to her next visit.   Patient states she is no longer having dizziness with her condition.  She does need to have a urine for microalbumin note her A1c is 6.0 recently and electrolytes were normal as was kidney function.    No evidence of end organ acute damage we we will adjust medications.  Therefore no need for emergency room the patient has no chest pain no shortness of breath no headaches.  The patient does have acne on the face and would like a refill of the Retin-A cream  05/05/22 Franky Macho 03/2022 Expand All Collapse All    S:    No chief complaint on file.   67 y.o. female who presents for hypertension evaluation, education, and management.  PMH is significant for HTN, T2DM, seizures, MDD.  Patient was referred and last seen by Primary Care Provider, Dr. Delford Field, on 02/24/22. We saw her last month and BP was at goal.   Today, patient arrives  in good spirits and presents without assistance. Denies dizziness, headache, blurred vision, swelling. She does tell me she has a couple of visits today and did not sleep well last night in anticipation of her visits today. Additionally, she ate some chicken soup for her breakfast this morning.    Family history: HTN, heart disease, stroke Social history: No tobacco use history.   Medication adherence appears appropriate, however, she tells me she missed her morning doses of medication this Saturday. Patient has taken BP medications today. She restricts sodium but tells me she ate chicken soup this morning for  breakfast.    Current antihypertensives include: amlodipine 10 mg daily, carvedilol 25 mg BID, hydralazine 50 mg TID, spironolactone 25 mg daily   Antihypertensives tried in the past include: lisinopril (cough) and losartan (heart racing)   Patient reported dietary habits: Endorses restricting salt in her diet and cooking at home.   Patient-reported exercise habits:  -Since last visit, she is walking daily for about ~30 minutes daily (sometimes between 40-50 minutes depending on foot pain)   ASCVD risk factors include: T2DM, HTN, family history   O:  Patient has a Archivist.    Vitals:   04/14/22 0919 BP: (!) 175/89 Pulse: (!) 54   Last 3 Office BP readings:  BP Readings from Last 3 Encounters: 04/14/22 (!) 175/89 03/10/22 126/77 02/24/22 (!) 216/104     BMET  Labs (Brief)   Component Value Date/Time   NA 143 02/24/2022 1010   K 3.9 02/24/2022 1010   CL 104 02/24/2022 1010   CO2 24 02/24/2022 1010   GLUCOSE 86 02/24/2022 1010   GLUCOSE 86 02/18/2016 0948   BUN 7 (L) 02/24/2022 1010   CREATININE 0.79 02/24/2022 1010   CREATININE 0.89 02/18/2016 0948   CALCIUM 9.6 02/24/2022 1010   GFRNONAA 60 12/26/2019 0949   GFRNONAA 73 08/17/2014 1013   GFRAA 69 12/26/2019 0949   GFRAA 84 08/17/2014 1013      Renal function: CrCl cannot be calculated (Patient's most recent lab result is older than the maximum 21 days allowed.).   Clinical ASCVD: No  The 10-year ASCVD risk score (Arnett DK, et al., 2019) is: 30.6%   Values used to calculate the score:     Age: 21 years     Sex: Female     Is Non-Hispanic African American: Yes     Diabetic: Yes     Tobacco smoker: No     Systolic Blood Pressure: 175 mmHg     Is BP treated: Yes     HDL Cholesterol: 49 mg/dL     Total Cholesterol: 144 mg/dL   Patient is participating in a Managed Medicaid Plan:  Yes    A/P: Hypertension diagnosed in 2008 currently above goal on current medications. BP goal < 130/80  mmHg. Medication adherence appears appropriate. She feels anxious during her doctor visits and admits she did not sleep well last night. That + the sodium in her soup this morning could be the cause of her elevated BP today. Her BP last month was good.    I trended her BP reading in our office (both mine and Dr. Lynelle Doctor visits since 09/2021): 09/25/21: 138/83 mmHg 10/21/21: 135/71 mmHg 11/04/21: 148/82 mmHg 02/24/22: 216/104 mmHg  03/10/22: 126/77 mmHg 04/14/22: 175/89 mmHg   It looks like her BP can vary quite a bit depending on her level of anxiety in clinic. I have no home values to compare these to. I will hold off  on changes since her BP last visit was okay. However, she will Dr. Delford Field in 3 weeks and we may need to consider additional changes at that time. Of note, K levels and kidney function looks good. She has intolerances to RAAS therapy but we can consider low dose valsartan or irbesartan at that visit. If she is resistant to this, I would recommend to increase spironolactone dose. Hydralazine TDD doses >150mg  increases risk for side effects, particularly drug-induced lupus. I would advise other options before this one.      -Continued amlodipine 10 mg daily.  -Continued spironolactone 25 mg daily.  - Continued carvedilol 25 mg BID. - Continued hydralazine 50 mg TID. -Counseled on lifestyle modifications for blood pressure control including reduced dietary sodium, increased exercise, adequate sleep. -Encouraged patient to check BP at home and bring log of readings to next visit. Counseled on proper use of home BP cuff.  -Recommend to try low-dose valsartan or increase spironolactone at next visit.       Patient Active Problem List   Diagnosis Date Noted  . Diabetic mononeuropathy associated with type 2 diabetes mellitus 02/24/2022  . Controlled type 2 diabetes mellitus without complication, without long-term current use of insulin 02/24/2022  . Influenza vaccine refused 12/26/2019  .  Adjustment disorder with anxious mood 08/18/2018  . Obesity (BMI 30-39.9) 09/17/2016  . Internal hemorrhoid 09/16/2016  . Adjustment disorder with mixed anxiety and depressed mood 12/04/2015  . Primary insomnia 12/04/2015  . Seizures 08/24/2013  . Class 2 severe obesity due to excess calories with serious comorbidity and body mass index (BMI) of 38.0 to 38.9 in adult   . 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 PES PLANUS, RIGHT 07/20/2007  . Acne 07/09/2006  . Major depressive disorder, recurrent episode, moderate 03/18/2006  . Essential hypertension 03/18/2006  . PSORIASIS 03/18/2006   Past Medical History:  Diagnosis Date  . Anxiety   . Carpal tunnel syndrome   . Diabetes mellitus without complication   . Diabetes mellitus, type II   . DYSTONIA 01/04/2007   Qualifier: Diagnosis of  By: Humberto Seals NP, Darl Pikes    . Facial droop 10/09/2013  . Hemorrhoid   . Hypertension   . Major depressive disorder, recurrent episode 03/18/2006   Qualifier: Diagnosis of  By: Bradly Bienenstock    . Obesity, unspecified 10/17/2007   Qualifier: Diagnosis of  By: Gerilyn Pilgrim PHD, Jeannie    . Prediabetes 10/09/2013  . Rectal bleeding 07/03/2015  . Seizures   . Seizures 08/24/2013   Past Surgical History:  Procedure Laterality Date  . ABDOMINAL HYSTERECTOMY    . COLONOSCOPY N/A 07/15/2015   Dr. Darrick Penna: small-mouth diverticula, small non-bleeding internal hemorrhoids, redundant colon   . HERNIA REPAIR    . MYOMECTOMY     Social History   Tobacco Use  . Smoking status: Never  . Smokeless tobacco: Never  Vaping Use  . Vaping Use: Never used  Substance Use Topics  . Alcohol use: No    Alcohol/week: 0.0 standard drinks of alcohol  . Drug use: No   Social History   Socioeconomic History  . Marital status: Divorced    Spouse name: Not on file  . Number of children: 1  . Years of education: Not on file  . Highest education level: Not on file  Occupational History  . Not on file  Tobacco Use  . Smoking status:  Never  . Smokeless tobacco: Never  Vaping Use  . Vaping Use: Never used  Substance and Sexual  Activity  . Alcohol use: No    Alcohol/week: 0.0 standard drinks of alcohol  . Drug use: No  . Sexual activity: Not Currently  Other Topics Concern  . Not on file  Social History Narrative   Right handed    Lives alone    Lives in apartment  on 6th floor    No caffeine   Social Determinants of Health   Financial Resource Strain: Low Risk  (04/14/2022)   Overall Financial Resource Strain (CARDIA)   . Difficulty of Paying Living Expenses: Not hard at all  Food Insecurity: Food Insecurity Present (04/14/2022)   Hunger Vital Sign   . Worried About Programme researcher, broadcasting/film/video in the Last Year: Sometimes true   . Ran Out of Food in the Last Year: Never true  Transportation Needs: No Transportation Needs (04/14/2022)   PRAPARE - Transportation   . Lack of Transportation (Medical): No   . Lack of Transportation (Non-Medical): No  Physical Activity: Sufficiently Active (04/14/2022)   Exercise Vital Sign   . Days of Exercise per Week: 5 days   . Minutes of Exercise per Session: 30 min  Stress: Stress Concern Present (04/14/2022)   [employer] of Occupational Health - Occupational Stress Questionnaire   . Feeling of Stress : To some extent  Social Connections: Socially Isolated (04/14/2022)   Social Connection and Isolation Panel [NHANES]   . Frequency of Communication with Friends and Family: Twice a week   . Frequency of Social Gatherings with Friends and Family: Twice a week   . Attends Religious Services: Never   . Active Member of Clubs or Organizations: No   . Attends Banker Meetings: Never   . Marital Status: Never married  Intimate Partner Violence: Not At Risk (04/14/2022)   Humiliation, Afraid, Rape, and Kick questionnaire   . Fear of Current or Ex-Partner: No   . Emotionally Abused: No   . Physically Abused: No   . Sexually Abused: No   Family Status  Relation Name  Status  . Mother  Deceased  . Father  Deceased  . Sister  (Not Specified)  . Mat Uncle  (Not Specified)  . Cousin  (Not Specified)  . MGM  Deceased  . MGF  Deceased  . PGM  Deceased  . PGF  Deceased  . Neg Hx  (Not Specified)   Family History  Problem Relation Age of Onset  . Asthma Mother   . Hypertension Father   . Heart disease Father   . Stroke Father   . Cancer Sister        uterus cancer   . Diabetes Maternal Uncle   . Breast cancer Cousin   . Colon cancer Neg Hx    Allergies  Allergen Reactions  . Codeine Anaphylaxis  . Shellfish Allergy Swelling and Rash    Throat Sweling  . Apple Cider Vinegar     syncope  . Cinnamon Hives  . Cozaar [Losartan]     Makes her feel like she is having a heart attack  . Lisinopril     cough  . Opium     Interferes with Keppra  . Banana Rash    Cannot eat any while taking KEPPRA  . Catfish [Fish Allergy] Rash  . Orange Fruit [Citrus] Rash    Cannot have while taking KEPPRA  . Tomato Itching and Rash      Review of Systems  Constitutional:  Negative for chills, diaphoresis, fever, malaise/fatigue and weight loss.  HENT:  Negative for congestion, hearing loss, nosebleeds, sore throat and tinnitus.   Eyes:  Negative for blurred vision, photophobia and redness.  Respiratory:  Negative for cough, hemoptysis, sputum production, shortness of breath, wheezing and stridor.   Cardiovascular:  Negative for chest pain, palpitations, orthopnea, claudication, leg swelling and PND.  Gastrointestinal:  Negative for abdominal pain, blood in stool, constipation, diarrhea, heartburn, nausea and vomiting.  Genitourinary:  Negative for dysuria, flank pain, frequency, hematuria and urgency.  Musculoskeletal:  Negative for back pain, falls, joint pain, myalgias and neck pain.       Neuropathic foot pain  Skin:  Positive for rash. Negative for itching.  Neurological:  Negative for dizziness, tingling, tremors, sensory change, speech change, focal  weakness, seizures, loss of consciousness, weakness and headaches.  Endo/Heme/Allergies:  Negative for environmental allergies and polydipsia. Does not bruise/bleed easily.  Psychiatric/Behavioral:  Negative for depression, memory loss, substance abuse and suicidal ideas. The patient is not nervous/anxious and does not have insomnia.       Objective:     There were no vitals taken for this visit. BP Readings from Last 3 Encounters:  04/14/22 (!) 175/89  03/10/22 126/77  02/24/22 (!) 216/104   Wt Readings from Last 3 Encounters:  02/24/22 197 lb 9.6 oz (89.6 kg)  10/21/21 201 lb 3.2 oz (91.3 kg)  09/25/21 204 lb 12.8 oz (92.9 kg)      Physical Exam Vitals reviewed.  Constitutional:      Appearance: Normal appearance. She is well-developed. She is obese. She is not diaphoretic.  HENT:     Head: Normocephalic and atraumatic.     Nose: Nose normal. No nasal deformity, septal deviation, mucosal edema or rhinorrhea.     Right Sinus: No maxillary sinus tenderness or frontal sinus tenderness.     Left Sinus: No maxillary sinus tenderness or frontal sinus tenderness.     Mouth/Throat:     Mouth: Mucous membranes are moist.     Pharynx: Oropharynx is clear. No oropharyngeal exudate.  Eyes:     General: No scleral icterus.    Conjunctiva/sclera: Conjunctivae normal.     Pupils: Pupils are equal, round, and reactive to light.  Neck:     Thyroid: No thyromegaly.     Vascular: No carotid bruit or JVD.     Trachea: Trachea normal. No tracheal tenderness or tracheal deviation.  Cardiovascular:     Rate and Rhythm: Normal rate and regular rhythm.     Chest Wall: PMI is not displaced.     Pulses: Normal pulses. No decreased pulses.     Heart sounds: Normal heart sounds, S1 normal and S2 normal. Heart sounds not distant. No murmur heard.    No systolic murmur is present.     No diastolic murmur is present.     No friction rub. No gallop. No S3 or S4 sounds.  Pulmonary:     Effort: No  tachypnea, accessory muscle usage or respiratory distress.     Breath sounds: No stridor. No decreased breath sounds, wheezing, rhonchi or rales.  Chest:     Chest wall: No tenderness.  Abdominal:     General: Bowel sounds are normal. There is no distension.     Palpations: Abdomen is soft. Abdomen is not rigid.     Tenderness: There is no abdominal tenderness. There is no guarding or rebound.  Musculoskeletal:        General: Normal range of motion.     Cervical back: Normal  range of motion and neck supple. No edema, erythema or rigidity. No muscular tenderness. Normal range of motion.     Comments: Onychomycosis  Lymphadenopathy:     Head:     Right side of head: No submental or submandibular adenopathy.     Left side of head: No submental or submandibular adenopathy.     Cervical: No cervical adenopathy.  Skin:    General: Skin is warm and dry.     Coloration: Skin is not pale.     Findings: Rash present.     Nails: There is no clubbing.     Comments: Acneiform rash in the lower portion of face  Neurological:     General: No focal deficit present.     Mental Status: She is alert and oriented to person, place, and time. Mental status is at baseline.     Cranial Nerves: No cranial nerve deficit.     Sensory: No sensory deficit.     Motor: No weakness.     Coordination: Coordination normal.     Gait: Gait normal.     Deep Tendon Reflexes: Reflexes normal.  Psychiatric:        Speech: Speech normal.        Behavior: Behavior normal.     No results found for any visits on 05/05/22.  Last CBC Lab Results  Component Value Date   WBC 7.7 12/26/2019   HGB 12.5 12/26/2019   HCT 36.7 12/26/2019   MCV 80 12/26/2019   MCH 27.2 12/26/2019   RDW 14.0 12/26/2019   PLT 341 12/26/2019   Last metabolic panel Lab Results  Component Value Date   GLUCOSE 86 02/24/2022   NA 143 02/24/2022   K 3.9 02/24/2022   CL 104 02/24/2022   CO2 24 02/24/2022   BUN 7 (L) 02/24/2022    CREATININE 0.79 02/24/2022   EGFR 83 02/24/2022   CALCIUM 9.6 02/24/2022   PHOS 3.6 02/24/2022   PROT 7.2 09/25/2021   ALBUMIN 4.2 02/24/2022   LABGLOB 2.7 09/25/2021   AGRATIO 1.7 09/25/2021   BILITOT 0.2 09/25/2021   ALKPHOS 99 09/25/2021   AST 18 09/25/2021   ALT 14 09/25/2021   ANIONGAP 13 10/05/2013   Last lipids Lab Results  Component Value Date   CHOL 144 01/29/2021   HDL 49 01/29/2021   LDLCALC 68 01/29/2021   TRIG 155 (H) 01/29/2021   CHOLHDL 2.9 01/29/2021   Last hemoglobin A1c Lab Results  Component Value Date   HGBA1C 6.0 (H) 02/24/2022   Last thyroid functions Lab Results  Component Value Date   TSH 1.660 12/26/2019   Last vitamin D Lab Results  Component Value Date   VD25OH 30.7 02/24/2022      The 10-year ASCVD risk score (Arnett DK, et al., 2019) is: 37%    Assessment & Plan:   Problem List Items Addressed This Visit   None No follow-ups on file.  35 minutes spent extra time needed for multisystem assessment and high disease risk  Shan Levans, MD

## 2022-05-05 ENCOUNTER — Ambulatory Visit: Payer: Medicaid Other | Admitting: Critical Care Medicine

## 2022-05-06 ENCOUNTER — Other Ambulatory Visit: Payer: Self-pay

## 2022-05-06 ENCOUNTER — Other Ambulatory Visit: Payer: Self-pay | Admitting: Critical Care Medicine

## 2022-05-06 DIAGNOSIS — R928 Other abnormal and inconclusive findings on diagnostic imaging of breast: Secondary | ICD-10-CM

## 2022-05-06 NOTE — Progress Notes (Signed)
Let pt know I have reviewed her mammogram, doubt malignancy and the breast center wants to do additional imaging they will contact her

## 2022-05-07 ENCOUNTER — Encounter (HOSPITAL_COMMUNITY): Payer: Self-pay | Admitting: Licensed Clinical Social Worker

## 2022-05-07 ENCOUNTER — Telehealth (INDEPENDENT_AMBULATORY_CARE_PROVIDER_SITE_OTHER): Payer: Self-pay

## 2022-05-07 ENCOUNTER — Ambulatory Visit (INDEPENDENT_AMBULATORY_CARE_PROVIDER_SITE_OTHER): Payer: Medicaid Other | Admitting: Licensed Clinical Social Worker

## 2022-05-07 DIAGNOSIS — F331 Major depressive disorder, recurrent, moderate: Secondary | ICD-10-CM

## 2022-05-07 NOTE — Progress Notes (Signed)
Virtual Visit via Telephone Note  I connected with [name] on 05/07/2022 at 11:00-11:45 am by phone enabled telehealth application and verified that I am speaking withthe correct person using two identifiers. Patient does not have a computer or Internet.   I discussed the limitations of evaluation and management by telemedicine and the availability of in person appointments. The patient expressed understanding and agreed to proceed.  LOCATION:  Patient: Home Provider: Home Office  Treatment Goal Addressed: Develop the ability to recognize, accept, and cope with feelings of depression AEB self report.  Progress Towards Goal: Progressing   History of Present Illness: Pt was referred for OP therapy by her neurologist Dr. [place] Lions for depression.   Observations/Objective: Patient presented for today's session on time and was alert, oriented x5, with no evidence or self-report of SI/HI or A/V H.  Patient reported ongoing compliance with medication and denied any use of alcohol or illicit substances. Clinician inquired about patient's current emotional ratings, as well as any significant changes in thoughts, feelings or behaviors. Pt reports on her family which causes her stress. Pt reports "I'm going to [facility] in May to sign up for government assistance. I'm going to live near my sister and several other relatives. My sister is going to fly  up and get me and fly back to [place]. She will go with me to all the government agencies to get me housing, food stamps, social security benefits." Cln and pt discussed feelings about moving, being with sister and other family members.     Clinician utilized MI OARS to reflect and summarize thoughts and feelings about her upcoming normal life.                                                           Collaboration of Care: Other: Continue to see Dr. Michae Kava and Dr. [place] Lions.  Patient/Guardian was advised Release of Information must be obtained prior to  any record release in order to collaborate their care with an outside provider. Patient/Guardian was advised if they have not already done so to contact the registration department to sign all necessary forms in order for [place] to release information regarding their care.   Consent: Patient/Guardian gives verbal consent for treatment and assignment of benefits for services provided during this visit. Patient/Guardian expressed understanding and agreed to proceed.      Assessment and plan: Counselor will continue to meet with patient to address treatment plan goals. Patient will continue to follow recommendations of providers and implement skills learned in session.   Follow Up Instructions:   I discussed the assessment and treatment plan with the patient. The patient was provided an opportunity to ask questions and all were answered. The patient agreed with the plan and demonstrated an understanding of the instructions.   The patient was advised to call back or seek an in-person evaluation if the symptoms worsen or if the condition fails to improve as anticipated.  I provided 45 minutes of non-face-to-face time during this encounter.   Swetha Rayle S, LCAS 05/07/22

## 2022-05-07 NOTE — Telephone Encounter (Signed)
-----   Message from [name], MD sent at 05/06/2022  5:42 AM EDT ----- Let pt know I have reviewed her mammogram, doubt malignancy and the breast center wants to do additional imaging they will contact her

## 2022-05-07 NOTE — Telephone Encounter (Signed)
Pt was called and vm was left, Information has been sent to nurse pool.   

## 2022-05-14 ENCOUNTER — Ambulatory Visit (INDEPENDENT_AMBULATORY_CARE_PROVIDER_SITE_OTHER): Payer: Medicaid Other | Admitting: Licensed Clinical Social Worker

## 2022-05-14 DIAGNOSIS — F331 Major depressive disorder, recurrent, moderate: Secondary | ICD-10-CM | POA: Diagnosis not present

## 2022-05-19 ENCOUNTER — Ambulatory Visit (INDEPENDENT_AMBULATORY_CARE_PROVIDER_SITE_OTHER): Payer: Medicaid Other | Admitting: Licensed Clinical Social Worker

## 2022-05-19 ENCOUNTER — Encounter (HOSPITAL_COMMUNITY): Payer: Self-pay | Admitting: Licensed Clinical Social Worker

## 2022-05-19 DIAGNOSIS — F32A Depression, unspecified: Secondary | ICD-10-CM

## 2022-05-19 DIAGNOSIS — F331 Major depressive disorder, recurrent, moderate: Secondary | ICD-10-CM

## 2022-05-19 NOTE — Progress Notes (Signed)
Virtual Visit via Telephone Note  I connected with [name] on 05/19/2022 at 10:00 am by phone enabled telehealth application and verified that I am speaking withthe correct person using two identifiers. Patient does not have a computer or Internet.   I discussed the limitations of evaluation and management by telemedicine and the availability of in person appointments. The patient expressed understanding and agreed to proceed.  LOCATION:  Patient: Home Provider: Home Office  Treatment Goal Addressed: Develop the ability to recognize, accept, and cope with feelings of depression AEB self report.  Progress Towards Goal: Progressing   History of Present Illness: Pt was referred for OP therapy by her neurologist Dr. [place] Lions for depression.   Observations/Objective: Patient presented for today's session on time and was alert, oriented x5, with no evidence or self-report of SI/HI or A/V H.  Patient reported ongoing compliance with medication and denied any use of alcohol or illicit substances. Clinician inquired about patient's current emotional ratings, as well as any significant changes in thoughts, feelings or behaviors. Pt reports on her family which causes her stress. Cln used CBT to assist in identifying positives to assess how her mood has been impacted. Clinician allowed space for patient to further process emotions and clinician provided supportive statements throughout session. It was suggested to patient to continue to identify positives that  impact mood daily. Cln provided education on how to to recognize the physical, cognitive, emotional, and behavioral responses that influence her mood.  Collaboration of Care: Other: Continue to see Dr. Michae Kava and Dr.  Lions.  Patient/Guardian was advised Release of Information must be obtained prior to any record release in order to collaborate their care with an outside provider. Patient/Guardian was advised if they have not already done so  to contact the registration department to sign all necessary forms in order for [place] to release information regarding their care.   Consent: Patient/Guardian gives verbal consent for treatment and assignment of benefits for services provided during this visit. Patient/Guardian expressed understanding and agreed to proceed.      Assessment and plan: Counselor will continue to meet with patient to address treatment plan goals. Patient will continue to follow recommendations of providers and implement skills learned in session.   Follow Up Instructions:   I discussed the assessment and treatment plan with the patient. The patient was provided an opportunity to ask questions and all were answered. The patient agreed with the plan and demonstrated an understanding of the instructions.   The patient was advised to call back or seek an in-person evaluation if the symptoms worsen or if the condition fails to improve as anticipated.  I provided 45 minutes of non-face-to-face time during this encounter.   Emran Molzahn S, LCAS

## 2022-05-20 ENCOUNTER — Telehealth: Payer: Self-pay

## 2022-05-20 ENCOUNTER — Ambulatory Visit: Payer: Medicaid Other

## 2022-05-20 ENCOUNTER — Ambulatory Visit
Admission: RE | Admit: 2022-05-20 | Discharge: 2022-05-20 | Disposition: A | Discharge status: Home / Self Care | Payer: Medicaid Other | Source: Ambulatory Visit | Attending: Critical Care Medicine | Admitting: Critical Care Medicine

## 2022-05-20 DIAGNOSIS — R928 Other abnormal and inconclusive findings on diagnostic imaging of breast: Secondary | ICD-10-CM

## 2022-05-20 NOTE — Telephone Encounter (Signed)
Pt was called and is aware of results, DOB was confirmed.  ?

## 2022-05-20 NOTE — Progress Notes (Signed)
Virtual Visit via Telephone Note  I connected with [name] on 04/21/2022 at 11:00 am by phone enabled telehealth application and verified that I am speaking withthe correct person using two identifiers. Patient does not have a computer or Internet.   I discussed the limitations of evaluation and management by telemedicine and the availability of in person appointments. The patient expressed understanding and agreed to proceed.  LOCATION:  Patient: Home Provider: Home Office  Treatment Goal Addressed: Develop the ability to recognize, accept, and cope with feelings of depression AEB self report.  Progress Towards Goal: Progressing   History of Present Illness: Pt was referred for OP therapy by her neurologist Dr. [place] Lions for depression.   Observations/Objective: Patient presented for today's session on time and was alert, oriented x5, with no evidence or self-report of SI/HI or A/V H.  Patient reported ongoing compliance with medication and denied any use of alcohol or illicit substances. Clinician inquired about patient's current emotional ratings, as well as any significant changes in thoughts, feelings or behaviors. Pt reports on her family which causes her stress.  Clinician utilized MI OARS to reflect and summarize thoughts and feelings.Clinician allowed space for patient to further process emotions and clinician provided supportive statements throughout session. It was suggested to patient to continue to identify positives that  impact mood daily. Cln practiced physical, cognitive, emotional, and behavioral responses that influence her mood.  Collaboration of Care: Other: Continue to see Dr. Michae Kava and Dr. [place] Lions.  Patient/Guardian was advised Release of Information must be obtained prior to any record release in order to collaborate their care with an outside provider. Patient/Guardian was advised if they have not already done so to contact the registration department to sign all  necessary forms in order for [place] to release information regarding their care.   Consent: Patient/Guardian gives verbal consent for treatment and assignment of benefits for services provided during this visit. Patient/Guardian expressed understanding and agreed to proceed.      Assessment and plan: Counselor will continue to meet with patient to address treatment plan goals. Patient will continue to follow recommendations of providers and implement skills learned in session.   Follow Up Instructions:   I discussed the assessment and treatment plan with the patient. The patient was provided an opportunity to ask questions and all were answered. The patient agreed with the plan and demonstrated an understanding of the instructions.   The patient was advised to call back or seek an in-person evaluation if the symptoms worsen or if the condition fails to improve as anticipated.  I provided 45 minutes of non-face-to-face time during this encounter.   Keelan Pomerleau S, LCAS

## 2022-05-20 NOTE — Telephone Encounter (Signed)
-----   Message from Roney Jaffe, [place] sent at 05/20/2022 11:05 AM EDT ----- Please call patient and let her know that her mammogram did not show any suspicious abnormality.  Recommend bilateral screening mammogram repeated in 1 year.

## 2022-05-22 ENCOUNTER — Encounter (HOSPITAL_COMMUNITY): Payer: Self-pay | Admitting: Licensed Clinical Social Worker

## 2022-05-22 NOTE — Progress Notes (Signed)
Virtual Visit via Telephone Note  I connected with [name] on 04/21/2022 at 11:00 am by phone enabled telehealth application and verified that I am speaking withthe correct person using two identifiers. Patient does not have a computer or Internet.   I discussed the limitations of evaluation and management by telemedicine and the availability of in person appointments. The patient expressed understanding and agreed to proceed.  LOCATION:  Patient: Home Provider: Home Office  Treatment Goal Addressed: Develop the ability to recognize, accept, and cope with feelings of depression AEB self report.  Progress Towards Goal: Progressing   History of Present Illness: Pt was referred for OP therapy by her neurologist Dr. [place] Lions for depression.   Observations/Objective: Patient presented for today's session on time and was alert, oriented x5, with no evidence or self-report of SI/HI or A/V H.  Patient reported ongoing compliance with medication and denied any use of alcohol or illicit substances. Clinician inquired about patient's current emotional ratings, as well as any significant changes in thoughts, feelings or behaviors. Pt reports on her family which causes her stress. Cln asked open-ended questions.  Depression 6/10, anxiety 5/10 and anger/irritability 3/10. Cln and pt reviewed her coping skills and practiced in session. Clinician utilized MI OARS to reflect and summarize thoughts and feelings.   Collaboration of Care: Other: Continue to see Dr. Michae Kava and Dr. [place] Lions.  Patient/Guardian was advised Release of Information must be obtained prior to any record release in order to collaborate their care with an outside provider. Patient/Guardian was advised if they have not already done so to contact the registration department to sign all necessary forms in order for [place] to release information regarding their care.   Consent: Patient/Guardian gives verbal consent for treatment and  assignment of benefits for services provided during this visit. Patient/Guardian expressed understanding and agreed to proceed.      Assessment and plan: Counselor will continue to meet with patient to address treatment plan goals. Patient will continue to follow recommendations of providers and implement skills learned in session.   Follow Up Instructions:   I discussed the assessment and treatment plan with the patient. The patient was provided an opportunity to ask questions and all were answered. The patient agreed with the plan and demonstrated an understanding of the instructions.   The patient was advised to call back or seek an in-person evaluation if the symptoms worsen or if the condition fails to improve as anticipated.  I provided 45 minutes of non-face-to-face time during this encounter.   Cathie Bonnell S, LCAS

## 2022-05-26 ENCOUNTER — Encounter (HOSPITAL_COMMUNITY): Payer: Self-pay | Admitting: Licensed Clinical Social Worker

## 2022-05-26 ENCOUNTER — Ambulatory Visit (INDEPENDENT_AMBULATORY_CARE_PROVIDER_SITE_OTHER): Payer: Medicaid Other | Admitting: Licensed Clinical Social Worker

## 2022-05-26 DIAGNOSIS — F331 Major depressive disorder, recurrent, moderate: Secondary | ICD-10-CM

## 2022-05-26 DIAGNOSIS — F32A Depression, unspecified: Secondary | ICD-10-CM | POA: Diagnosis not present

## 2022-06-01 ENCOUNTER — Encounter (HOSPITAL_COMMUNITY): Payer: Self-pay | Admitting: Licensed Clinical Social Worker

## 2022-06-01 NOTE — Progress Notes (Signed)
Virtual Visit via Telephone Note  I connected with [name] on 05/14/2022 at 11:00-11:45 am by phone enabled telehealth application and verified that I am speaking withthe correct person using two identifiers. Patient does not have a computer or Internet.   I discussed the limitations of evaluation and management by telemedicine and the availability of in person appointments. The patient expressed understanding and agreed to proceed.  LOCATION:  Patient: Home Provider: Home Office  Treatment Goal Addressed: Develop the ability to recognize, accept, and cope with feelings of depression AEB self report.  Progress Towards Goal: Progressing   History of Present Illness: Pt was referred for OP therapy by her neurologist Dr. [place] Lions for depression.   Observations/Objective: Patient presented for today's session on time and was alert, oriented x5, with no evidence or self-report of SI/HI or A/V H.  Patient reported ongoing compliance with medication and denied any use of alcohol or illicit substances. Clinician inquired about patient's current emotional ratings, as well as any significant changes in thoughts, feelings or behaviors. Pt reports on her family which causes her stress. Pt reports "I'm going to [facility] next week to sign up for government assistance. I'm going to live near my sister and several other relatives. My sister is going to fly  up and get me and fly back to [place]. She will go with me to all the government agencies to get me housing, food stamps, social security benefits." Again, Cln and pt discussed feelings about moving, being with sister and other family members.  Clinician utilized MI OARS to reflect and summarize thoughts and feelings. "I am afraid of moving." Cln and pt discussed fear of the unknown.    Collaboration of Care: Other: Continue to see Dr. Michae Kava and Dr. [place] Lions.  Patient/Guardian was advised Release of Information must be obtained prior to any record  release in order to collaborate their care with an outside provider. Patient/Guardian was advised if they have not already done so to contact the registration department to sign all necessary forms in order for [place] to release information regarding their care.   Consent: Patient/Guardian gives verbal consent for treatment and assignment of benefits for services provided during this visit. Patient/Guardian expressed understanding and agreed to proceed.      Assessment and plan: Counselor will continue to meet with patient to address treatment plan goals. Patient will continue to follow recommendations of providers and implement skills learned in session.   Follow Up Instructions:   I discussed the assessment and treatment plan with the patient. The patient was provided an opportunity to ask questions and all were answered. The patient agreed with the plan and demonstrated an understanding of the instructions.   The patient was advised to call back or seek an in-person evaluation if the symptoms worsen or if the condition fails to improve as anticipated.  I provided 45 minutes of non-face-to-face time during this encounter.   Jahari Wiginton S, LCAS 05/14/22

## 2022-06-02 ENCOUNTER — Ambulatory Visit (INDEPENDENT_AMBULATORY_CARE_PROVIDER_SITE_OTHER): Payer: Medicaid Other | Admitting: Licensed Clinical Social Worker

## 2022-06-02 ENCOUNTER — Other Ambulatory Visit: Payer: Self-pay

## 2022-06-02 ENCOUNTER — Telehealth: Payer: Self-pay

## 2022-06-02 DIAGNOSIS — F331 Major depressive disorder, recurrent, moderate: Secondary | ICD-10-CM

## 2022-06-02 NOTE — Progress Notes (Signed)
   [place] Heidel 06-Sep-1955 [id number]  Patient outreached by Buddy Duty, PharmD Candidate on 06/02/2022 while they were picking up prescriptions at [facility].  Blood Pressure Readings: Last documented ambulatory systolic blood pressure: 192 Last documented ambulatory diastolic blood pressure: 97 Does the patient have a validated home blood pressure machine?: Yes   Medication review was performed. Is the patient taking their medications as prescribed?: Yes  The following barriers to adherence were noted: Does the patient have cost concerns?: Yes (Pt has concerns and would prefer $0 for medications) Does the patient have questions or concerns about their medications?: No Does the patient have a follow up scheduled with their primary care provider/cardiologist?: Yes  Patient is requesting 90 day supply for all medications. Also, between either [facility] or [facility], she wants $0 copay for her medications.  Interventions: Interventions Completed: Medications were reviewed  The patient has follow up scheduled:  PCP: [name], MD   Buddy Duty, Student-PharmD

## 2022-06-04 ENCOUNTER — Other Ambulatory Visit: Payer: Self-pay | Admitting: Critical Care Medicine

## 2022-06-04 ENCOUNTER — Ambulatory Visit: Payer: Medicaid Other | Attending: Critical Care Medicine | Admitting: Critical Care Medicine

## 2022-06-04 ENCOUNTER — Other Ambulatory Visit: Payer: Self-pay

## 2022-06-04 ENCOUNTER — Encounter: Payer: Self-pay | Admitting: Critical Care Medicine

## 2022-06-04 ENCOUNTER — Telehealth: Payer: Self-pay

## 2022-06-04 VITALS — BP 184/97 | HR 62 | Ht 62.0 in | Wt 205.2 lb

## 2022-06-04 DIAGNOSIS — E119 Type 2 diabetes mellitus without complications: Secondary | ICD-10-CM

## 2022-06-04 DIAGNOSIS — I10 Essential (primary) hypertension: Secondary | ICD-10-CM

## 2022-06-04 DIAGNOSIS — Z7984 Long term (current) use of oral hypoglycemic drugs: Secondary | ICD-10-CM | POA: Diagnosis not present

## 2022-06-04 DIAGNOSIS — F331 Major depressive disorder, recurrent, moderate: Secondary | ICD-10-CM

## 2022-06-04 DIAGNOSIS — F5101 Primary insomnia: Secondary | ICD-10-CM | POA: Diagnosis not present

## 2022-06-04 DIAGNOSIS — R7303 Prediabetes: Secondary | ICD-10-CM

## 2022-06-04 DIAGNOSIS — R569 Unspecified convulsions: Secondary | ICD-10-CM

## 2022-06-04 LAB — POCT GLYCOSYLATED HEMOGLOBIN (HGB A1C): HbA1c, POC (controlled diabetic range): 5.8 % (ref 0.0–7.0)

## 2022-06-04 LAB — GLUCOSE, POCT (MANUAL RESULT ENTRY): POC Glucose: 97 mg/dl (ref 70–99)

## 2022-06-04 MED ORDER — METFORMIN HCL 500 MG PO TABS
500.0000 mg | ORAL_TABLET | Freq: Every day | ORAL | 2 refills | Status: DC
Start: 2022-06-04 — End: 2022-09-15
  Filled 2022-06-04 – 2022-08-18 (×2): qty 90, 90d supply, fill #0

## 2022-06-04 MED ORDER — VITAMIN D (ERGOCALCIFEROL) 1.25 MG (50000 UNIT) PO CAPS
50000.0000 [IU] | ORAL_CAPSULE | ORAL | 3 refills | Status: DC
  Filled 2022-06-04 – 2022-08-18 (×2): qty 12, 84d supply, fill #0

## 2022-06-04 MED ORDER — AMLODIPINE BESYLATE 10 MG PO TABS
10.0000 mg | ORAL_TABLET | Freq: Every day | ORAL | 3 refills | Status: DC
Start: 2022-06-04 — End: 2022-09-15
  Filled 2022-06-04 – 2022-08-18 (×2): qty 90, 90d supply, fill #0

## 2022-06-04 MED ORDER — HYDRALAZINE HCL 50 MG PO TABS
50.0000 mg | ORAL_TABLET | Freq: Three times a day (TID) | ORAL | 6 refills | Status: DC
Start: 2022-06-04 — End: 2022-07-14
  Filled 2022-06-04: qty 90, 30d supply, fill #0

## 2022-06-04 MED ORDER — SPIRONOLACTONE 50 MG PO TABS
50.0000 mg | ORAL_TABLET | Freq: Every day | ORAL | 2 refills | Status: DC
  Filled 2022-06-04 – 2022-07-14 (×2): qty 90, 90d supply, fill #0

## 2022-06-04 MED ORDER — LEVETIRACETAM ER 500 MG PO TB24
500.0000 mg | ORAL_TABLET | Freq: Three times a day (TID) | ORAL | 3 refills | Status: DC
  Filled 2022-06-04: qty 270, 90d supply, fill #0
  Filled 2022-08-18: qty 90, 30d supply, fill #0

## 2022-06-04 MED ORDER — MIRTAZAPINE 30 MG PO TABS
30.0000 mg | ORAL_TABLET | Freq: Every day | ORAL | 1 refills | Status: DC
Start: 2022-06-04 — End: 2022-09-15
  Filled 2022-06-04 – 2022-08-18 (×2): qty 90, 90d supply, fill #0

## 2022-06-04 MED ORDER — CARVEDILOL 25 MG PO TABS
25.0000 mg | ORAL_TABLET | Freq: Two times a day (BID) | ORAL | 2 refills | Status: DC
  Filled 2022-06-04: qty 120, 60d supply, fill #0

## 2022-06-04 NOTE — Assessment & Plan Note (Signed)
Not well-controlled patient educated as to taking all her medications as prescribed and will increase Aldactone to 50 mg daily and we will see this patient back in short-term follow-up with clinical pharmacy

## 2022-06-04 NOTE — Assessment & Plan Note (Signed)
Keppra refilled 

## 2022-06-04 NOTE — Telephone Encounter (Signed)
I met with the patient when she was in the clinic this morning.  She explained that she has applied for Medicare and currently has [facility] and is interested in other options she may have with Medicare - .extra help/ Medicare savings programs.   I provided her with the contact information for [employer] [place]: [phone] and encouraged her to call.   She was very appreciative of the information and said she would call today. I instructed her to call me with any questions.     I also explained to her that I can refer her to Managed Medicaid Care Management to see if there are any additional benefits that she may have through Medicaid that she is not aware of.  She was interested in that and I told her that I would place that referral today.

## 2022-06-04 NOTE — Progress Notes (Signed)
Established Patient Office Visit  Subjective   Patient ID: [name], female    DOB: 06-24-1955  Age: 67 y.o. MRN: [id number]  Chief Complaint  Patient presents with   Diabetes   Hypertension      5/23 This is a 67 year old female last seen in January by PA Mcclung previously by Dr. Laural Benes for diabetes and hypertension.  She has more of a prediabetic condition but comes today with a blood pressure initially of 209/112 on recheck was 190/110.  She was in and out of her blood pressure medications for about a week.  She does not have a blood pressure meter at this time she lost the one she had.  She is supposed to be on hydralazine 50 mg twice a day amlodipine 10 mg a day and hydrochlorothiazide 25 mg a day.  She had normal readings last fall but these have increased since that she had lost her medication and lost her blood pressure meter.  Patient has carried a diagnosis of diabetes along a prediabetic range.  Last A1c 4 months ago was 6.0  Patient had an appointment in April with Dr. Laural Benes mid left without being seen when the office was busy for the patient to wait  This patient is also on Keppra for seizures  The patient states her mental health is good she has a counselor and mental health provider  Patient has been seizure-free  Patient does note a rash on the face.  Patient does have elevated cholesterol.  Primarily has triglycerides  10/3  Patient states since last visit she has done very well on arrival blood pressure is much improved 135/71.  She is looking for a new blood pressure meter and would like to get the [employer] she was given different locations to try to find 1.  She states her acne is improved.  She does have some foot pain and is followed by podiatry.  There are no other complaints.   Below is documentation from clinical pharmacy earlier last month Seen by [facility] CPP 09/25/2021 Hypertension longstanding currently close to goal on current medications. BP goal <  130/80 mmHg. Medication adherence appears optimal. Given her frequency of dizzy spells, BP close to goal, and that she does not have a working BP cuff at this time, no changes were made to her home regimen.  -Continued amlodipine 10mg  once daily at bedtime -Continued carvedilol 12.5mg  BID -Continued hydralazine 50mg  TID -Continued spironolactone 25mg  once daily.  -Encouraged her to take her BP at home, especially when she experiences dizziness and bring a log of the readings to her next visit.   Patient states she is no longer having dizziness with her condition.  She does need to have a urine for microalbumin note her A1c is 6.0 recently and electrolytes were normal as was kidney function.   06/04/22 Essential hypertension - Primary (Chronic)       Plan for this patient will be to increase carvedilol to 25 mg twice daily she will continue amlodipine hydralazine Aldactone as it currently is dosed.   Patient may yet need an ARB   Check renal panel this visit   Return short-term follow-up with clinical pharmacy then Dr. Delford Field short-term after that visit   The following Lifestyle Medicine recommendations according to American College of Lifestyle Medicine [facility]) were discussed and offered to patient who agrees to start the journey:  A. Whole Foods, Plant-based plate comprising of fruits and vegetables, plant-based proteins, whole-grain carbohydrates was discussed in detail with  the patient.   A list for source of those nutrients were also provided to the patient.  Patient will use only water or unsweetened tea for hydration. B.  The need to stay away from risky substances including alcohol, smoking; obtaining 7 to 9 hours of restorative sleep, at least 150 minutes of moderate intensity exercise weekly, the importance of healthy social connections,  and stress reduction techniques were discussed. C.  A full color page of  Calorie density of various food groups per pound showing examples of each  food groups was provided to the patient.          Relevant Medications   amLODipine (NORVASC) 10 MG tablet   carvedilol (COREG) 25 MG tablet   hydrALAZINE (APRESOLINE) 50 MG tablet   spironolactone (ALDACTONE) 25 MG tablet   Other Relevant Orders   Renal Function Panel      Endocrine   Diabetic mononeuropathy associated with type 2 diabetes mellitus (HCC)   Relevant Medications   levETIRAcetam (KEPPRA XR) 500 MG 24 hr tablet   metFORMIN (GLUCOPHAGE) 500 MG tablet   Other Relevant Orders   Hemoglobin A1c   Controlled type 2 diabetes mellitus without complication, without long-term current use of insulin (HCC)      Diabetes currently controlled with hemoglobin A1c of 6 we will continue to monitor with current medication Foot exam was normal      Relevant Medications   metFORMIN (GLUCOPHAGE) 500 MG tablet   Other Relevant Orders   Hemoglobin A1c      Other   Seizures (HCC) (Chronic)      No active seizures continue Keppra      Relevant Medications   levETIRAcetam (KEPPRA XR) 500 MG 24 hr tablet   Class 2 severe obesity due to excess calories with serious comorbidity and body mass index (BMI) of 38.0 to 38.9 in adult [facility])   Relevant Medications   metFORMIN (GLUCOPHAGE) 500 MG tablet   Major depressive disorder, recurrent episode, moderate (HCC)   Obesity (BMI 30-39.9)      Patient was given lifestyle medicine approach and education The following Lifestyle Medicine recommendations according to American College of Lifestyle Medicine [facility]) were discussed and offered to patient who agrees to start the journey:  A. Whole Foods, Plant-based plate comprising of fruits and vegetables, plant-based proteins, whole-grain carbohydrates was discussed in detail with the patient.   A list for source of those nutrients were also provided to the patient.  Patient will use only water or unsweetened tea for hydration. B.  The need to stay away from risky substances including alcohol,  smoking; obtaining 7 to 9 hours of restorative sleep, at least 150 minutes of moderate intensity exercise weekly, the importance of healthy social connections,  and stress reduction techniques were discussed. C.  A full color page of  Calorie density of various food groups per pound showing examples of each food groups was provided to the patient.        Relevant Medications   metFORMIN (GLUCOPHAGE) 500 MG tablet   RESOLVED: Prediabetes (Chronic)   Relevant Medications   metFORMIN (GLUCOPHAGE) 500 MG tablet   Other Visit Diagnoses      Vitamin D deficiency       Relevant Orders   VITAMIN D 25 Hydroxy (Vit-D Deficiency, Fractures)   Per luke 03/2022 Expand All Collapse All    S:    No chief complaint on file.   67 y.o. female who presents for hypertension evaluation, education, and management.  PMH is significant for HTN, T2DM, seizures, MDD.  Patient was referred and last seen by Primary Care Provider, Dr. Delford Field, on 02/24/22. We saw her last month and BP was at goal.   Today, patient arrives in good spirits and presents without assistance. Denies dizziness, headache, blurred vision, swelling. She does tell me she has a couple of visits today and did not sleep well last night in anticipation of her visits today. Additionally, she ate some chicken soup for her breakfast this morning.    Family history: HTN, heart disease, stroke Social history: No tobacco use history.   Medication adherence appears appropriate, however, she tells me she missed her morning doses of medication this Saturday. Patient has taken BP medications today. She restricts sodium but tells me she ate chicken soup this morning for breakfast.    Current antihypertensives include: amlodipine 10 mg daily, carvedilol 25 mg BID, hydralazine 50 mg TID, spironolactone 25 mg daily   Antihypertensives tried in the past include: lisinopril (cough) and losartan (heart racing)   Patient reported dietary habits: Endorses  restricting salt in her diet and cooking at home.   Patient-reported exercise habits:  -Since last visit, she is walking daily for about ~30 minutes daily (sometimes between 40-50 minutes depending on foot pain)   ASCVD risk factors include: T2DM, HTN, family history   O:  Patient has a Archivist.    Vitals:   04/14/22 0919 BP: (!) 175/89 Pulse: (!) 54   Last 3 Office BP readings:  BP Readings from Last 3 Encounters: 04/14/22 (!) 175/89 03/10/22 126/77 02/24/22 (!) 216/104     BMET  Labs (Brief)   Component Value Date/Time   NA 143 02/24/2022 1010   K 3.9 02/24/2022 1010   CL 104 02/24/2022 1010   CO2 24 02/24/2022 1010   GLUCOSE 86 02/24/2022 1010   GLUCOSE 86 02/18/2016 0948   BUN 7 (L) 02/24/2022 1010   CREATININE 0.79 02/24/2022 1010   CREATININE 0.89 02/18/2016 0948   CALCIUM 9.6 02/24/2022 1010   GFRNONAA 60 12/26/2019 0949   GFRNONAA 73 08/17/2014 1013   GFRAA 69 12/26/2019 0949   GFRAA 84 08/17/2014 1013      Renal function: CrCl cannot be calculated (Patient's most recent lab result is older than the maximum 21 days allowed.).   Clinical ASCVD: No  The 10-year ASCVD risk score (Arnett DK, et al., 2019) is: 30.6%   Values used to calculate the score:     Age: 89 years     Sex: Female     Is Non-Hispanic African American: Yes     Diabetic: Yes     Tobacco smoker: No     Systolic Blood Pressure: 175 mmHg     Is BP treated: Yes     HDL Cholesterol: 49 mg/dL     Total Cholesterol: 144 mg/dL   Patient is participating in a Managed Medicaid Plan:  Yes    A/P: Hypertension diagnosed in 2008 currently above goal on current medications. BP goal < 130/80 mmHg. Medication adherence appears appropriate. She feels anxious during her doctor visits and admits she did not sleep well last night. That + the sodium in her soup this morning could be the cause of her elevated BP today. Her BP last month was good.    I trended her BP reading in our  office (both mine and Dr. Lynelle Doctor visits since 09/2021): 09/25/21: 138/83 mmHg 10/21/21: 135/71 mmHg 11/04/21: 148/82 mmHg 02/24/22: 216/104 mmHg  03/10/22:  126/77 mmHg 04/14/22: 175/89 mmHg   It looks like her BP can vary quite a bit depending on her level of anxiety in clinic. I have no home values to compare these to. I will hold off on changes since her BP last visit was okay. However, she will Dr. Delford Field in 3 weeks and we may need to consider additional changes at that time. Of note, K levels and kidney function looks good. She has intolerances to RAAS therapy but we can consider low dose valsartan or irbesartan at that visit. If she is resistant to this, I would recommend to increase spironolactone dose. Hydralazine TDD doses >150mg  increases risk for side effects, particularly drug-induced lupus. I would advise other options before this one.   06/04/22 This patient is seen in short-term follow-up and previously had been seen by clinical pharmacy as noted above in March.  The patient has struggled to take her medications as scheduled.  She misses her afternoon and middle the day dose of her hydralazine and carvedilol.  The patient's A1c is at goal 5.8 blood sugar 97 but blood pressure 184/97 when I recheck it it is no different.  Patient does have a mental health doctor is retiring she would like refills on her Remeron.  The patient would also benefit from case management on her insurance status.  Patient is actually able to afford her medications and her co-pay she is just not taking medications as prescribed.  $ issues     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) 02/24/2022   Controlled type 2 diabetes mellitus without complication, without long-term current use of insulin (HCC) 02/24/2022   Influenza vaccine refused 12/26/2019   Adjustment disorder with anxious mood 08/18/2018   Obesity (BMI 30-39.9)  09/17/2016   Internal hemorrhoid 09/16/2016   Adjustment disorder with mixed anxiety and depressed mood 12/04/2015   Primary insomnia 12/04/2015   Seizures (HCC) 08/24/2013   Class 2 severe obesity due to excess calories with serious comorbidity and body mass index (BMI) of 38.0 to 38.9 in adult (HCC)    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   PES PLANUS, RIGHT 07/20/2007   Acne 07/09/2006   Major depressive disorder, recurrent episode, moderate (HCC) 03/18/2006   Essential hypertension 03/18/2006   PSORIASIS 03/18/2006   Past Medical History:  Diagnosis Date   Anxiety    Carpal tunnel syndrome    Diabetes mellitus without complication (HCC)    Diabetes mellitus, type II (HCC)    DYSTONIA 01/04/2007   Qualifier: Diagnosis of  By: Humberto Seals NP, Darl Pikes     Facial droop 10/09/2013   Hemorrhoid    Hypertension    Major depressive disorder, recurrent episode (HCC) 03/18/2006   Qualifier: Diagnosis of  By: Bradly Bienenstock     Obesity, unspecified 10/17/2007   Qualifier: Diagnosis of  By: Gerilyn Pilgrim PHD, Jeannie     Prediabetes 10/09/2013   Rectal bleeding 07/03/2015   Seizures (HCC)    Seizures (HCC) 08/24/2013   Past Surgical History:  Procedure Laterality Date   ABDOMINAL HYSTERECTOMY     COLONOSCOPY N/A 07/15/2015   Dr. Darrick Penna: small-mouth diverticula, small non-bleeding internal hemorrhoids, redundant colon    HERNIA REPAIR     MYOMECTOMY     Social History   Tobacco Use   Smoking status: Never   Smokeless tobacco: Never  Vaping Use   Vaping Use: Never used  Substance Use Topics   Alcohol use: No  Alcohol/week: 0.0 standard drinks of alcohol   Drug use: No   Social History   Socioeconomic History   Marital status: Divorced    Spouse name: Not on file   Number of children: 1   Years of education: Not on file   Highest education level: Not on file  Occupational History   Not on file  Tobacco Use   Smoking status: Never   Smokeless tobacco: Never  Vaping Use   Vaping Use: Never  used  Substance and Sexual Activity   Alcohol use: No    Alcohol/week: 0.0 standard drinks of alcohol   Drug use: No   Sexual activity: Not Currently  Other Topics Concern   Not on file  Social History Narrative   Right handed    Lives alone    Lives in apartment  on 6th floor    No caffeine   Social Determinants of Health   Financial Resource Strain: Low Risk  (04/14/2022)   Overall Financial Resource Strain (CARDIA)    Difficulty of Paying Living Expenses: Not hard at all  Food Insecurity: Food Insecurity Present (04/14/2022)   Hunger Vital Sign    Worried About Running Out of Food in the Last Year: Sometimes true    Ran Out of Food in the Last Year: Never true  Transportation Needs: No Transportation Needs (04/14/2022)   PRAPARE - Administrator, Civil Service (Medical): No    Lack of Transportation (Non-Medical): No  Physical Activity: Sufficiently Active (04/14/2022)   Exercise Vital Sign    Days of Exercise per Week: 5 days    Minutes of Exercise per Session: 30 min  Stress: Stress Concern Present (04/14/2022)   [employer] of Occupational Health - Occupational Stress Questionnaire    Feeling of Stress : To some extent  Social Connections: Socially Isolated (04/14/2022)   Social Connection and Isolation Panel [NHANES]    Frequency of Communication with Friends and Family: Twice a week    Frequency of Social Gatherings with Friends and Family: Twice a week    Attends Religious Services: Never    Database administrator or Organizations: No    Attends Banker Meetings: Never    Marital Status: Never married  Intimate Partner Violence: Not At Risk (04/14/2022)   Humiliation, Afraid, Rape, and Kick questionnaire    Fear of Current or Ex-Partner: No    Emotionally Abused: No    Physically Abused: No    Sexually Abused: No   Family Status  Relation Name Status   Mother  Deceased   Father  Deceased   Sister  (Not Specified)   Mat Uncle  (Not  Specified)   Cousin  (Not Specified)   MGM  Deceased   MGF  Deceased   PGM  Deceased   PGF  Deceased   Neg Hx  (Not Specified)   Family History  Problem Relation Age of Onset   Asthma Mother    Hypertension Father    Heart disease Father    Stroke Father    Cancer Sister        uterus cancer    Diabetes Maternal Uncle    Breast cancer Cousin    Colon cancer Neg Hx    Allergies  Allergen Reactions   Codeine Anaphylaxis   Shellfish Allergy Swelling and Rash    Throat Sweling   Apple Cider Vinegar     syncope   Cinnamon Hives   Cozaar [Losartan]  Makes her feel like she is having a heart attack   Lisinopril     cough   Opium     Interferes with Keppra   Banana Rash    Cannot eat any while taking KEPPRA   Catfish [Fish Allergy] Rash   Orange Fruit [Citrus] Rash    Cannot have while taking KEPPRA   Tomato Itching and Rash      Review of Systems  Constitutional:  Negative for chills, diaphoresis, fever, malaise/fatigue and weight loss.  HENT:  Negative for congestion, hearing loss, nosebleeds, sore throat and tinnitus.   Eyes:  Negative for blurred vision, photophobia and redness.  Respiratory:  Negative for cough, hemoptysis, sputum production, shortness of breath, wheezing and stridor.   Cardiovascular:  Negative for chest pain, palpitations, orthopnea, claudication, leg swelling and PND.  Gastrointestinal:  Negative for abdominal pain, blood in stool, constipation, diarrhea, heartburn, nausea and vomiting.  Genitourinary:  Negative for dysuria, flank pain, frequency, hematuria and urgency.  Musculoskeletal:  Negative for back pain, falls, joint pain, myalgias and neck pain.       Neuropathic foot pain  Skin:  Negative for itching and rash.  Neurological:  Negative for dizziness, tingling, tremors, sensory change, speech change, focal weakness, seizures, loss of consciousness, weakness and headaches.  Endo/Heme/Allergies:  Negative for environmental allergies and  polydipsia. Does not bruise/bleed easily.  Psychiatric/Behavioral:  Negative for depression, memory loss, substance abuse and suicidal ideas. The patient is not nervous/anxious and does not have insomnia.       Objective:     BP (!) 184/97 (BP Location: Left Arm, Patient Position: Sitting, Cuff Size: Normal)   Pulse 62   Ht 5\' 2"  (1.575 m)   Wt 205 lb 3.2 oz (93.1 kg)   SpO2 98%   BMI 37.53 kg/m  BP Readings from Last 3 Encounters:  06/04/22 (!) 184/97  04/14/22 (!) 175/89  03/10/22 126/77   Wt Readings from Last 3 Encounters:  06/04/22 205 lb 3.2 oz (93.1 kg)  02/24/22 197 lb 9.6 oz (89.6 kg)  10/21/21 201 lb 3.2 oz (91.3 kg)      Physical Exam Vitals reviewed.  Constitutional:      Appearance: Normal appearance. She is well-developed. She is obese. She is not diaphoretic.  HENT:     Head: Normocephalic and atraumatic.     Nose: Nose normal. No nasal deformity, septal deviation, mucosal edema or rhinorrhea.     Right Sinus: No maxillary sinus tenderness or frontal sinus tenderness.     Left Sinus: No maxillary sinus tenderness or frontal sinus tenderness.     Mouth/Throat:     Mouth: Mucous membranes are moist.     Pharynx: Oropharynx is clear. No oropharyngeal exudate.  Eyes:     General: No scleral icterus.    Conjunctiva/sclera: Conjunctivae normal.     Pupils: Pupils are equal, round, and reactive to light.  Neck:     Thyroid: No thyromegaly.     Vascular: No carotid bruit or JVD.     Trachea: Trachea normal. No tracheal tenderness or tracheal deviation.  Cardiovascular:     Rate and Rhythm: Normal rate and regular rhythm.     Chest Wall: PMI is not displaced.     Pulses: Normal pulses. No decreased pulses.     Heart sounds: Normal heart sounds, S1 normal and S2 normal. Heart sounds not distant. No murmur heard.    No systolic murmur is present.     No diastolic murmur  is present.     No friction rub. No gallop. No S3 or S4 sounds.  Pulmonary:     Effort:  No tachypnea, accessory muscle usage or respiratory distress.     Breath sounds: No stridor. No decreased breath sounds, wheezing, rhonchi or rales.  Chest:     Chest wall: No tenderness.  Abdominal:     General: Bowel sounds are normal. There is no distension.     Palpations: Abdomen is soft. Abdomen is not rigid.     Tenderness: There is no abdominal tenderness. There is no guarding or rebound.  Musculoskeletal:        General: Normal range of motion.     Cervical back: Normal range of motion and neck supple. No edema, erythema or rigidity. No muscular tenderness. Normal range of motion.     Comments: Onychomycosis  Lymphadenopathy:     Head:     Right side of head: No submental or submandibular adenopathy.     Left side of head: No submental or submandibular adenopathy.     Cervical: No cervical adenopathy.  Skin:    General: Skin is warm and dry.     Coloration: Skin is not pale.     Findings: Rash present.     Nails: There is no clubbing.     Comments: Acneiform rash in the lower portion of face  Neurological:     General: No focal deficit present.     Mental Status: She is alert and oriented to person, place, and time. Mental status is at baseline.     Cranial Nerves: No cranial nerve deficit.     Sensory: No sensory deficit.     Motor: No weakness.     Coordination: Coordination normal.     Gait: Gait normal.     Deep Tendon Reflexes: Reflexes normal.  Psychiatric:        Speech: Speech normal.        Behavior: Behavior normal.      Results for orders placed or performed in visit on 06/04/22  POCT glycosylated hemoglobin (Hb A1C)  Result Value Ref Range   Hemoglobin A1C     HbA1c POC (<> result, manual entry)     HbA1c, POC (prediabetic range)     HbA1c, POC (controlled diabetic range) 5.8 0.0 - 7.0 %  POCT glucose (manual entry)  Result Value Ref Range   POC Glucose 97 70 - 99 mg/dl    Last CBC Lab Results  Component Value Date   WBC 7.7 12/26/2019   HGB  12.5 12/26/2019   HCT 36.7 12/26/2019   MCV 80 12/26/2019   MCH 27.2 12/26/2019   RDW 14.0 12/26/2019   PLT 341 12/26/2019   Last metabolic panel Lab Results  Component Value Date   GLUCOSE 86 02/24/2022   NA 143 02/24/2022   K 3.9 02/24/2022   CL 104 02/24/2022   CO2 24 02/24/2022   BUN 7 (L) 02/24/2022   CREATININE 0.79 02/24/2022   EGFR 83 02/24/2022   CALCIUM 9.6 02/24/2022   PHOS 3.6 02/24/2022   PROT 7.2 09/25/2021   ALBUMIN 4.2 02/24/2022   LABGLOB 2.7 09/25/2021   AGRATIO 1.7 09/25/2021   BILITOT 0.2 09/25/2021   ALKPHOS 99 09/25/2021   AST 18 09/25/2021   ALT 14 09/25/2021   ANIONGAP 13 10/05/2013   Last lipids Lab Results  Component Value Date   CHOL 144 01/29/2021   HDL 49 01/29/2021   LDLCALC 68 01/29/2021  TRIG 155 (H) 01/29/2021   CHOLHDL 2.9 01/29/2021   Last hemoglobin A1c Lab Results  Component Value Date   HGBA1C 5.8 06/04/2022   Last thyroid functions Lab Results  Component Value Date   TSH 1.660 12/26/2019   Last vitamin D Lab Results  Component Value Date   VD25OH 30.7 02/24/2022      The 10-year ASCVD risk score (Arnett DK, et al., 2019) is: 33.9%    Assessment & Plan:   Problem List Items Addressed This Visit       Cardiovascular and Mediastinum   Essential hypertension (Chronic)    Not well-controlled patient educated as to taking all her medications as prescribed and will increase Aldactone to 50 mg daily and we will see this patient back in short-term follow-up with clinical pharmacy      Relevant Medications   amLODipine (NORVASC) 10 MG tablet   carvedilol (COREG) 25 MG tablet   hydrALAZINE (APRESOLINE) 50 MG tablet   spironolactone (ALDACTONE) 50 MG tablet   Other Relevant Orders   Basic metabolic panel     Endocrine   Controlled type 2 diabetes mellitus without complication, without long-term current use of insulin (HCC) - Primary    Type 2 diabetes controlled no change in medications      Relevant  Medications   metFORMIN (GLUCOPHAGE) 500 MG tablet   Other Relevant Orders   POCT glycosylated hemoglobin (Hb A1C) (Completed)   POCT glucose (manual entry) (Completed)   Basic metabolic panel     Other   Seizures (HCC) (Chronic)    Keppra refilled      Relevant Medications   levETIRAcetam (KEPPRA XR) 500 MG 24 hr tablet   Primary insomnia (Chronic)   Relevant Medications   mirtazapine (REMERON) 30 MG tablet   Major depressive disorder, recurrent episode, moderate (HCC)   Relevant Medications   mirtazapine (REMERON) 30 MG tablet   Other Visit Diagnoses     Prediabetes  (Chronic)      Relevant Medications   metFORMIN (GLUCOPHAGE) 500 MG tablet     Return in about 3 months (around 09/04/2022) for htn, diabetes.  35 minutes spent extra time needed for multisystem assessment and high disease risk  Shan Levans, MD

## 2022-06-04 NOTE — Patient Instructions (Signed)
Make sure you take your blood pressure medicines as ordered, carvedilol is 1 twice daily, hydralazine is 1 3 times daily, amlodipine is 1 daily, the spironolactone will be 2 tablets daily and the refill will be a single tablet of a higher dose daily  All medicines were refilled  Labs will include metabolic panel  Get an eye exam this calendar year  Return to see [facility] clinical pharmacist in 4 weeks and Dr. Delford Field in 3 months

## 2022-06-04 NOTE — Assessment & Plan Note (Signed)
Type 2 diabetes controlled no change in medications

## 2022-06-04 NOTE — Progress Notes (Signed)
Right foot pain

## 2022-06-05 ENCOUNTER — Telehealth: Payer: Self-pay

## 2022-06-05 DIAGNOSIS — E119 Type 2 diabetes mellitus without complications: Secondary | ICD-10-CM

## 2022-06-05 LAB — BASIC METABOLIC PANEL
BUN/Creatinine Ratio: 14 (ref 12–28)
BUN: 11 mg/dL (ref 8–27)
CO2: 23 mmol/L (ref 20–29)
Calcium: 9.9 mg/dL (ref 8.7–10.3)
Chloride: 103 mmol/L (ref 96–106)
Creatinine, Ser: 0.77 mg/dL (ref 0.57–1.00)
Glucose: 79 mg/dL (ref 70–99)
Potassium: 4.2 mmol/L (ref 3.5–5.2)
Sodium: 141 mmol/L (ref 134–144)
eGFR: 86 mL/min/{1.73_m2} (ref >59)

## 2022-06-05 NOTE — Progress Notes (Signed)
Let pt know kidney potassium normal  no medication change

## 2022-06-05 NOTE — Telephone Encounter (Signed)
Pt was called and is aware of results, DOB was confirmed.  ?

## 2022-06-05 NOTE — Telephone Encounter (Signed)
-----   Message from [name], MD sent at 06/05/2022  8:45 AM EDT ----- Let pt know kidney potassium normal  no medication change

## 2022-06-08 NOTE — Telephone Encounter (Signed)
Ref made

## 2022-06-08 NOTE — Addendum Note (Signed)
Addended by: [name] on: 06/08/2022 06:03 AM   Modules accepted: Orders

## 2022-06-08 NOTE — Telephone Encounter (Signed)
Called and left voice mail

## 2022-06-10 ENCOUNTER — Other Ambulatory Visit: Payer: Medicaid Other

## 2022-06-10 NOTE — Patient Instructions (Signed)
Visit Information  [name] was given information about Medicaid Managed Care team care coordination services as a part of their [facility] Community Plan Medicaid benefit. [name] verbally consented to engagement with the [facility] Managed Care team.   If you are experiencing a medical emergency, please call 911 or report to your local emergency department or urgent care.   If you have a non-emergency medical problem during routine business hours, please contact your provider's office and ask to speak with a nurse.   For questions related to your [facility], please call: [phone] or visit the homepage here: [email or web address]  If you would like to schedule transportation through your [facility], please call the following number at least 2 days in advance of your appointment: [phone]   Rides for urgent appointments can also be made after hours by calling Member Services.  Call the Behavioral Health Crisis Line at [phone], at any time, 24 hours a day, 7 days a week. If you are in danger or need immediate medical attention call 911.  If you would like help to quit smoking, call 1-800-QUIT-NOW (740-532-2701) OR Espaol: 1-855-Djelo-Ya (0-630-160-1093) o para ms informacin haga clic aqu or Text READY to 235-573 to register via text  [name] - following are the goals we discussed in your visit today:   Goals Addressed   None      Social Worker will follow up on 08/06/22.   Gus Puma, Kenard Gower, MHA [facility]  Managed Medicaid Social Worker [phone]   Following is a copy of your plan of care:  There are no care plans that you recently modified to display for this patient.

## 2022-06-10 NOTE — Patient Outreach (Signed)
Medicaid Managed Care Social Work Note  06/10/2022 Name:  [name] MRN:  [id number] DOB:  [birth year]  [name] is an 67 y.o. year old female who is a primary patient of Kirsten Frisk, MD.  The [facility] Managed Care Coordination team was consulted for assistance with:   extra benefits  [name] was given information about Medicaid Managed Care Coordination team services today. [name] Patient agreed to services and verbal consent obtained.  Engaged with patient  for by telephone forinitial visit in response to referral for case management and/or care coordination services.   Assessments/Interventions:  Review of past medical history, allergies, medications, health status, including review of consultants reports, laboratory and other test data, was performed as part of comprehensive evaluation and provision of chronic care management services.  SDOH: (Social Determinant of Health) assessments and interventions performed: SDOH Interventions    Flowsheet Row Office Visit from 04/14/2022 in [facility] Patient Outreach from 06/27/2021 in [facility] Community Care Coordination  SDOH Interventions    Financial Strain Interventions Other (Comment)  [Ensured that patient's rx needs are being met. She has no current issues with medication fills and denies any cost-prohibitive issues with her medications.] Intervention Not Indicated      BSW completed a telephone outreach with patient regarding extra benefits. Patient states she is supposed to switch to [facility] in July and wanted to know what her exra benefits are. BSW will mail patient a list of wellcare benefits. No other resources are needed at this time. Advanced Directives Status:  Not addressed in this encounter.  Care Plan                 Allergies  Allergen Reactions   Codeine Anaphylaxis   Shellfish Allergy Swelling and Rash    Throat Sweling   Apple Cider  Vinegar     syncope   Cinnamon Hives   Cozaar [Losartan]     Makes her feel like she is having a heart attack   Lisinopril     cough   Opium     Interferes with Keppra   Banana Rash    Cannot eat any while taking KEPPRA   Catfish [Fish Allergy] Rash   Orange Fruit [Citrus] Rash    Cannot have while taking KEPPRA   Tomato Itching and Rash    Medications Reviewed Today     Reviewed by Kirsten Frisk, MD (Physician) on 06/04/22 at 1824  Med List Status: <None>   Medication Order Taking? Sig Documenting Provider Last Dose Status Informant  Accu-Chek Softclix Lancets lancets [id number] No Use as instructed  Patient not taking: Reported on 06/04/2022   Kirsten Frisk, MD Not Taking Active   amLODipine (NORVASC) 10 MG tablet [id number]  Take 1 tablet (10 mg total) by mouth daily. Kirsten Frisk, MD  Active   Blood Glucose Monitoring Suppl (ACCU-CHEK GUIDE) w/Device KIT [id number] No Use to check blood sugar twice day  Patient not taking: Reported on 06/04/2022   Kirsten Frisk, MD Not Taking Active   Blood Pressure Monitoring (BLOOD PRESSURE KIT) DEVI [id number] No Use to measure blood pressure  Patient not taking: Reported on 06/04/2022   Kirsten Frisk, MD Not Taking Active   carvedilol (COREG) 25 MG tablet [id number]  Take 1 tablet (25 mg total) by mouth 2 (two) times daily with a meal. Kirsten Frisk, MD  Active   glucose blood (ACCU-CHEK GUIDE) test strip [id number] No  Use as instructed  Patient not taking: Reported on 06/04/2022   Kirsten Frisk, MD Not Taking Active   hydrALAZINE (APRESOLINE) 50 MG tablet [id number]  Take 1 tablet (50 mg total) by mouth 3 (three) times daily. Kirsten Frisk, MD  Active   levETIRAcetam (KEPPRA XR) 500 MG 24 hr tablet [id number]  Take 1 tablet (500 mg total) by mouth 3 (three) times daily. Kirsten Frisk, MD  Active   loratadine (CLARITIN) 10 MG tablet [id number] Yes TAKE 1 TABLET (10 MG TOTAL) BY MOUTH DAILY. Kirsten Frisk, MD  Taking Active   metFORMIN (GLUCOPHAGE) 500 MG tablet [id number]  Take 1 tablet (500 mg total) by mouth daily with breakfast. Kirsten Frisk, MD  Active   mirtazapine (REMERON) 30 MG tablet [id number]  Take 1 tablet (30 mg total) by mouth at bedtime. Kirsten Frisk, MD  Active   spironolactone (ALDACTONE) 50 MG tablet [id number]  Take 1 tablet (50 mg total) by mouth daily. Kirsten Frisk, MD  Active   tretinoin (RETIN-A) 0.05 % cream [id number] Yes Apply topically at bedtime. Kirsten Frisk, MD Taking Active   Vitamin D, Ergocalciferol, (DRISDOL) 1.25 MG (50000 UNIT) CAPS capsule [id number]  Take 1 capsule (50,000 Units total) by mouth once a week. Kirsten Frisk, MD  Active   Med List Note [name], CPhT 11/05/21 1106): PT USES A SPECIFIC NDC FOR HYDRALAZINE 75643-329-51 TEVA BRAND ONLY, DO NOT FILL IT WITH ANYTHING ELSE!!!            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) 02/24/2022   Controlled type 2 diabetes mellitus without complication, without long-term current use of insulin (HCC) 02/24/2022   Influenza vaccine refused 12/26/2019   Adjustment disorder with anxious mood 08/18/2018   Obesity (BMI 30-39.9) 09/17/2016   Internal hemorrhoid 09/16/2016   Adjustment disorder with mixed anxiety and depressed mood 12/04/2015   Primary insomnia 12/04/2015   Seizures (HCC) 08/24/2013   Class 2 severe obesity due to excess calories with serious comorbidity and body mass index (BMI) of 38.0 to 38.9 in adult (HCC)    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   Follow  up:  Patient agrees to Care Plan and Follow-up.  Plan: The Managed Medicaid care management team will reach out to the patient again over the next 90 days.  Date/time of next scheduled Social Work care management/care coordination outreach:  08/06/22  [name], [name], [facility]  Managed [facility] Social Worker [phone]

## 2022-06-14 ENCOUNTER — Encounter (HOSPITAL_COMMUNITY): Payer: Self-pay | Admitting: Licensed Clinical Social Worker

## 2022-06-14 NOTE — Progress Notes (Signed)
Virtual Visit via Telephone Note  I connected with [name] on 05/26/2022 at 10:00-10:45 am by phone enabled telehealth application and verified that I am speaking withthe correct person using two identifiers. Patient does not have a computer or Internet.   I discussed the limitations of evaluation and management by telemedicine and the availability of in person appointments. The patient expressed understanding and agreed to proceed.  LOCATION:  Patient: Home Provider: Home Office  Treatment Goal Addressed: Develop the ability to recognize, accept, and cope with feelings of depression AEB self report.  Progress Towards Goal: Progressing   History of Present Illness: Pt was referred for OP therapy by her neurologist Dr. [place] Lions for depression.   Observations/Objective: Patient presented for today's session on time and was alert, oriented x5, with no evidence or self-report of SI/HI or A/V H.  Patient reported ongoing compliance with medication and denied any use of alcohol or illicit substances. Clinician inquired about patient's current emotional ratings, as well as any significant changes in thoughts, feelings or behaviors. Pt reports on her family which causes her stress. Pt reports 5/10 for depression and 4/10 for anxiety. Clinician utilized MI OARS to affirm concerns. Clinician challenged thoughts about this. Clinician processed options for communicating her concerns to her daughter. Cln discussed boundaries with pt and role played them in session. Pt still finds it hard to use boundaries with family.      Collaboration of Care: Other: Continue to see Dr. Michae Kava and Dr. [place] Lions.  Patient/Guardian was advised Release of Information must be obtained prior to any record release in order to collaborate their care with an outside provider. Patient/Guardian was advised if they have not already done so to contact the registration department to sign all necessary forms in order for [place] to  release information regarding their care.   Consent: Patient/Guardian gives verbal consent for treatment and assignment of benefits for services provided during this visit. Patient/Guardian expressed understanding and agreed to proceed.      Assessment and plan: Counselor will continue to meet with patient to address treatment plan goals. Patient will continue to follow recommendations of providers and implement skills learned in session.   Follow Up Instructions:   I discussed the assessment and treatment plan with the patient. The patient was provided an opportunity to ask questions and all were answered. The patient agreed with the plan and demonstrated an understanding of the instructions.   The patient was advised to call back or seek an in-person evaluation if the symptoms worsen or if the condition fails to improve as anticipated.  I provided 60 minutes of non-face-to-face time during this encounter.   Halima Fogal S, LCAS

## 2022-06-16 ENCOUNTER — Encounter (HOSPITAL_COMMUNITY): Payer: Self-pay | Admitting: Licensed Clinical Social Worker

## 2022-06-16 ENCOUNTER — Ambulatory Visit: Payer: Medicaid Other | Admitting: Podiatry

## 2022-06-16 DIAGNOSIS — M79675 Pain in left toe(s): Secondary | ICD-10-CM

## 2022-06-16 DIAGNOSIS — B351 Tinea unguium: Secondary | ICD-10-CM | POA: Diagnosis not present

## 2022-06-16 DIAGNOSIS — E119 Type 2 diabetes mellitus without complications: Secondary | ICD-10-CM

## 2022-06-16 DIAGNOSIS — M79674 Pain in right toe(s): Secondary | ICD-10-CM | POA: Diagnosis not present

## 2022-06-16 NOTE — Progress Notes (Signed)
Subjective: Chief Complaint  Patient presents with   Nail Problem    Rm 13 RFC bilateral nail trim.     67 year old female presents the office with concerns of thick, discolored toenails that she cannot trim herself and they are causing discomfort as well as for calluses.  She has no other concerns today.  No open lesions.  Kirsten Frisk, MD Last seen 06/04/2022  A1c 6.0 on 02/24/2022  Objective: AAO x3, NAD DP/PT pulses palpable bilaterally, CRT less than 3 seconds Nails are hypertrophic, dystrophic, brittle, discolored, elongated 10. No surrounding redness or drainage. Tenderness nails 1-5 bilaterally. There is a annular hyper pigmented lesion present on the right third toe consistent with a mole.  Still unchanged. No pain with calf compression, swelling, warmth, erythema  Assessment: Type 2 diabetes with neuropathy, symptomatic onychomycosis  Plan: -All treatment options discussed with the patient including all alternatives, risks, complications.  -Sharply debrided nails x10 without any complications or bleeding -Discussed moisturizer to the feet daily.  -Discussed daily foot inspection -Patient encouraged to call the office with any questions, concerns, change in symptoms.   [name] DPM

## 2022-06-16 NOTE — Progress Notes (Signed)
Virtual Visit via Telephone Note  I connected with [name] on 06/02/2022 at 11:00-12:00 pm by phone enabled telehealth application and verified that I am speaking withthe correct person using two identifiers. Patient does not have a computer or Internet.   I discussed the limitations of evaluation and management by telemedicine and the availability of in person appointments. The patient expressed understanding and agreed to proceed.  LOCATION:  Patient: Home Provider: Home Office  Treatment Goal Addressed: Develop the ability to recognize, accept, and cope with feelings of depression AEB self report.  Progress Towards Goal: Progressing   History of Present Illness: Pt was referred for OP therapy by her neurologist Dr. [place] Lions for depression.   Observations/Objective: Patient presented for today's session on time and was alert, oriented x5, with no evidence or self-report of SI/HI or A/V H.  Patient reported ongoing compliance with medication and denied any use of alcohol or illicit substances. Clinician inquired about patient's current emotional ratings, as well as any significant changes in thoughts, feelings or behaviors. Pt reports on her family which causes her stress. Pt reports 5/10 for depression and 4/10 for anxiety. Clinician engaged in discussion on identified positives and utilized MI, OARS to assess how these have impacted client's mood. Clinician allowed space for further processing of emotions and provided supportive statements throughout session.    Collaboration of Care: Other: Continue to see Dr. Michae Kava and Dr. [place] Lions.  Patient/Guardian was advised Release of Information must be obtained prior to any record release in order to collaborate their care with an outside provider. Patient/Guardian was advised if they have not already done so to contact the registration department to sign all necessary forms in order for [place] to release information regarding their care.    Consent: Patient/Guardian gives verbal consent for treatment and assignment of benefits for services provided during this visit. Patient/Guardian expressed understanding and agreed to proceed.      Assessment and plan: Counselor will continue to meet with patient to address treatment plan goals. Patient will continue to follow recommendations of providers and implement skills learned in session.   Follow Up Instructions:   I discussed the assessment and treatment plan with the patient. The patient was provided an opportunity to ask questions and all were answered. The patient agreed with the plan and demonstrated an understanding of the instructions.   The patient was advised to call back or seek an in-person evaluation if the symptoms worsen or if the condition fails to improve as anticipated.  I provided 60 minutes of non-face-to-face time during this encounter.   Nathalie Cavendish S, LCAS

## 2022-06-17 ENCOUNTER — Ambulatory Visit (HOSPITAL_COMMUNITY): Payer: Medicaid Other | Admitting: Licensed Clinical Social Worker

## 2022-06-17 NOTE — Telephone Encounter (Signed)
Patient attempted to be outreached

## 2022-06-23 ENCOUNTER — Ambulatory Visit (INDEPENDENT_AMBULATORY_CARE_PROVIDER_SITE_OTHER): Payer: Medicaid Other | Admitting: Licensed Clinical Social Worker

## 2022-06-23 DIAGNOSIS — F331 Major depressive disorder, recurrent, moderate: Secondary | ICD-10-CM | POA: Diagnosis not present

## 2022-06-25 ENCOUNTER — Encounter (HOSPITAL_COMMUNITY): Payer: Self-pay | Admitting: Licensed Clinical Social Worker

## 2022-06-25 NOTE — Progress Notes (Signed)
Virtual Visit via Telephone Note  I connected with [name] on 06/23/2022 at 11:00-11:45 pm by phone enabled telehealth application and verified that I am speaking withthe correct person using two identifiers. Patient does not have a computer or Internet.   I discussed the limitations of evaluation and management by telemedicine and the availability of in person appointments. The patient expressed understanding and agreed to proceed.  LOCATION:  Patient: Home Provider: Home Office  Treatment Goal Addressed: Develop the ability to recognize, accept, and cope with feelings of depression AEB self report.  Progress Towards Goal: Progressing   History of Present Illness: Pt was referred for OP therapy by her neurologist Dr. [place] Lions for depression.   Observations/Objective: Patient presented for today's session on time and was alert, oriented x5, with no evidence or self-report of SI/HI or A/V H.  Patient reported ongoing compliance with medication and denied any use of alcohol or illicit substances. Clinician inquired about patient's current emotional ratings, as well as any significant changes in thoughts, feelings or behaviors. Pt reports on her family which causes her stress. Pt reports 5/10 for depression and 4/10 for anxiety. Pt decided not to go to [facility] to visit her sister to discover if she wants to move to [place]. Cln asked open ended questions. "I just decided I wasn't ready to move." Clinician utilized CBT to address thought processes support and confidence in her decisions.     Collaboration of Care: Other: Continue to see Dr. Michae Kava and Dr.  Lions.  Patient/Guardian was advised Release of Information must be obtained prior to any record release in order to collaborate their care with an outside provider. Patient/Guardian was advised if they have not already done so to contact the registration department to sign all necessary forms in order for [place] to release information  regarding their care.   Consent: Patient/Guardian gives verbal consent for treatment and assignment of benefits for services provided during this visit. Patient/Guardian expressed understanding and agreed to proceed.      Assessment and plan: Counselor will continue to meet with patient to address treatment plan goals. Patient will continue to follow recommendations of providers and implement skills learned in session.   Follow Up Instructions:   I discussed the assessment and treatment plan with the patient. The patient was provided an opportunity to ask questions and all were answered. The patient agreed with the plan and demonstrated an understanding of the instructions.   The patient was advised to call back or seek an in-person evaluation if the symptoms worsen or if the condition fails to improve as anticipated.  I provided 45 minutes of non-face-to-face time during this encounter.   [name] S, LCAS

## 2022-06-30 ENCOUNTER — Encounter (HOSPITAL_COMMUNITY): Payer: Self-pay | Admitting: Licensed Clinical Social Worker

## 2022-06-30 ENCOUNTER — Ambulatory Visit (INDEPENDENT_AMBULATORY_CARE_PROVIDER_SITE_OTHER): Payer: Medicaid Other | Admitting: Licensed Clinical Social Worker

## 2022-06-30 DIAGNOSIS — F331 Major depressive disorder, recurrent, moderate: Secondary | ICD-10-CM | POA: Diagnosis not present

## 2022-06-30 NOTE — Progress Notes (Signed)
Virtual Visit via Telephone Note  I connected with [name] on 06/30/2022 at 11:00-12:00 pm by phone enabled telehealth application and verified that I am speaking withthe correct person using two identifiers. Patient does not have a computer or Internet.   I discussed the limitations of evaluation and management by telemedicine and the availability of in person appointments. The patient expressed understanding and agreed to proceed.  LOCATION:  Patient: Home Provider: Home Office  Treatment Goal Addressed: Develop the ability to recognize, accept, and cope with feelings of depression AEB self report.  Progress Towards Goal: Progressing   History of Present Illness: Pt was referred for OP therapy by her neurologist Dr. [place] [name] for depression.   Observations/Objective: Patient presented for today's session on time and was alert, oriented x5, with no evidence or self-report of SI/HI or A/V H.  Patient reported ongoing compliance with medication and denied any use of alcohol or illicit substances. Clinician inquired about patient's current emotional ratings, as well as any significant changes in thoughts, feelings or behaviors. Pt reports on her family which causes her stress. Pt reports 5/10 for depression and 4/10 for anxiety and 1/10 for irritability.     Collaboration of Care: Other: Continue to see Dr. Michae [name] and Dr. [place] [name].  Patient/Guardian was advised Release of Information must be obtained prior to any record release in order to collaborate their care with an outside provider. Patient/Guardian was advised if they have not already done so to contact the registration department to sign all necessary forms in order for [place] to release information regarding their care.   Consent: Patient/Guardian gives verbal consent for treatment and assignment of benefits for services provided during this visit. Patient/Guardian expressed understanding and agreed to proceed.      Assessment  and plan: Counselor will continue to meet with patient to address treatment plan goals. Patient will continue to follow recommendations of providers and implement skills learned in session.   Follow Up Instructions:   I discussed the assessment and treatment plan with the patient. The patient was provided an opportunity to ask questions and all were answered. The patient agreed with the plan and demonstrated an understanding of the instructions.   The patient was advised to call back or seek an in-person evaluation if the symptoms worsen or if the condition fails to improve as anticipated.  I provided 45 minutes of non-face-to-face time during this encounter.   Jefry Lesinski S, LCAS

## 2022-07-06 LAB — HM DIABETES EYE EXAM

## 2022-07-07 ENCOUNTER — Ambulatory Visit: Payer: Medicaid Other | Admitting: Neurology

## 2022-07-07 ENCOUNTER — Encounter: Payer: Self-pay | Admitting: Neurology

## 2022-07-07 ENCOUNTER — Ambulatory Visit (HOSPITAL_COMMUNITY): Payer: Medicaid Other | Admitting: Licensed Clinical Social Worker

## 2022-07-07 VITALS — BP 149/61 | HR 60 | Ht 62.0 in | Wt 208.6 lb

## 2022-07-07 DIAGNOSIS — R569 Unspecified convulsions: Secondary | ICD-10-CM

## 2022-07-07 NOTE — Progress Notes (Signed)
NEUROLOGY FOLLOW UP OFFICE NOTE  [name] [id number] 1955-04-05  HISTORY OF PRESENT ILLNESS: I had the pleasure of seeing [name] in follow-up in the neurology clinic on 07/07/2022.  The patient was last seen a year ago. She has a diagnosis of seizures with recurrent episodes of loss of consciousness with report of right-sided symptoms initially, then she started having symptoms preceded by pain and discomfort in the her extremities. 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 She was started on Keppra at [facility] last 2015 for seizures. Her 48-hour EEG in 2018 was normal.  She reports that she did well for almost a year until last Sunday when she had 2 dizzy spells. She was at [employer] and had a bad dizzy spell where she had to sit down on the floor. She was able to talk and ask for water, telling staff she just needed to relax and be still. This lasted 7 minutes. When she got home, she had another episode lasting 8 minutes, she leaned herself against the wall, sat and closed her eyes to ride it out. There was no shaking or loss of consciousness. She feels it was because she took her medication on an empty stomach later than her usual time in the morning. She denies any increased stress this time. BP was not checked during events. Her BP has been high, today it is 149/61 and she states this is better for her. She feels she is on too much BP medication. She reports that when she was visiting her daughter in [place], she noticed her right hand was shaking. This has not recurred since she got back to [place] in February. She reports a family history of Parkinson's disease on her father's side. She has cramping and foot pain that she attributes to diabetic neuropathy. Gabapentin in the past did not help.    History on Initial Assessment 11/2014: This is a pleasant 67 yo RH woman with a history of hypertension and a diagnosis of seizures.  She reports that neurological symptoms started in 2005 when she would have involuntary movements of the right leg. She states she had lost her grandmother and this had an effect on her, and had symptoms since then. She had seen neurologist Dr. Aletha Halim at that time and was diagnosed with focal limb dystomia, told that this may be caused by a chemical imbalance. She was treated with Botox but did not tolerate it. She reports seeing Dr. Estella Husk from 2006 to 2009, but also at the same time she started having blackouts. She reports that she had a blackout in her doctor's office one time, but instead of being brought to [facility] ER, she was brought to [facility]. She has had blackouts so bad that neighbors would tell her she needed to go to the ER. She usually starts feeling pain in both legs, followed by right-sided shaking, numbness on the left side of her face, throat gets tight, then she falls to the floor. She lives by herself and has woken up on the floor. She reports that she can see herself shaking and would go into a fetal position to "ride it out."   In August 2015, she was brought to the ER due to recent increase in frequency of spells. She reported bilateral numbness and tingling as well as a feeling of discomfort in both upper and lower extremities. She has been told she has convulsions involving her legs. She  reported urinary incontinence and tongue soreness. Her BP was very high at that time. 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 No definite gliosis of heterotopic gray matter. Her routine EEG was normal. She was discharged home on Keppra 1000mg  BID which she has been tolerating without side effects. She reports doing well with no similar symptoms until the October 2016. She brings a  calendar of her events, She had one last 10/30/14. She reports she had been stressed because her daughter told her that day that she is moving to [place] to what her mother thinks is an unhealthy relationship. She had another episode on 10/30 while walking. On 11/25/14 she had 2 episodes, one lasting 12 minutes, another one lasting an hour. She reports being in the shower, she grabbed the handle and sat in the tub rocking in a fetal position, which she found helps. She believes stress is causing the recent seizures and talking to a therapist does help her. She states she does not know how to relax. 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 There is no history of febrile convulsions, CNS infections such as meningitis/encephalitis, significant traumatic brain injury, neurosurgical procedures, or family history of seizures.  Diagnostic Data: She had a 48-hour EEG which was normal. She reported numbness in her right hand and arm, sharp stomach pain, and dizziness. There were no epileptiform discharges seen during these events. The day she reported the dizziness, she did not push the button, but there was note a a transient significant increase in heart rate from 78 bpm to 168 bpm for around 2 minutes, she was not on video at that time. She had a myocardial perfusion scan last 11/2016 which showed an EF of 66%, small defect of moderate severity in the mid anterior and apical anterior location consistent with ischemia, but given normal systolic function, could not rule out breast attenuation artifact. She was started on a beta blocker with improvement in tachycardia, 30-day holter with normal sinus rhythm, no arrhythmia. She has been evaluated by Cardiology.   PAST MEDICAL HISTORY: Past Medical History:  Diagnosis Date   Anxiety    Carpal tunnel syndrome    Diabetes mellitus without complication (HCC)    Diabetes mellitus, type II (HCC)    DYSTONIA 01/04/2007    Qualifier: Diagnosis of  By: Humberto Seals NP, Darl Pikes     Facial droop 10/09/2013   Hemorrhoid    Hypertension    Major depressive disorder, recurrent episode (HCC) 03/18/2006   Qualifier: Diagnosis of  By: Bradly Bienenstock     Obesity, unspecified 10/17/2007   Qualifier: Diagnosis of  By: Gerilyn Pilgrim PHD, Jeannie     Prediabetes 10/09/2013   Rectal bleeding 07/03/2015   Seizures (HCC)    Seizures (HCC) 08/24/2013    MEDICATIONS: Current Outpatient Medications on File Prior to Visit  Medication Sig Dispense Refill   Accu-Chek Softclix Lancets lancets Use as instructed 100 each 12   amLODipine (NORVASC) 10 MG tablet Take 1 tablet (10 mg total) by mouth daily. 90 tablet 3   Blood Glucose Monitoring Suppl (ACCU-CHEK GUIDE) w/Device KIT Use to check blood sugar twice day 1 kit 0   Blood Pressure Monitoring (BLOOD PRESSURE KIT) DEVI Use to measure blood pressure 1 each 0   carvedilol (COREG) 25 MG tablet Take 1 tablet (25 mg  total) by mouth 2 (two) times daily with a meal. 120 tablet 2   glucose blood (ACCU-CHEK GUIDE) test strip Use as instructed 100 each 12   hydrALAZINE (APRESOLINE) 50 MG tablet Take 1 tablet (50 mg total) by mouth 3 (three) times daily. 90 tablet 6   levETIRAcetam (KEPPRA XR) 500 MG 24 hr tablet Take 1 tablet (500 mg total) by mouth 3 (three) times daily. 270 tablet 3   loratadine (CLARITIN) 10 MG tablet TAKE 1 TABLET (10 MG TOTAL) BY MOUTH DAILY. 60 tablet 2   metFORMIN (GLUCOPHAGE) 500 MG tablet Take 1 tablet (500 mg total) by mouth daily with breakfast. 90 tablet 2   mirtazapine (REMERON) 30 MG tablet Take 1 tablet (30 mg total) by mouth at bedtime. 90 tablet 1   spironolactone (ALDACTONE) 50 MG tablet Take 1 tablet (50 mg total) by mouth daily. 90 tablet 2   tretinoin (RETIN-A) 0.05 % cream Apply topically at bedtime. 45 g 2   Vitamin D, Ergocalciferol, (DRISDOL) 1.25 MG (50000 UNIT) CAPS capsule Take 1 capsule (50,000 Units total) by mouth once a week. 12 capsule 3   No current  facility-administered medications on file prior to visit.    ALLERGIES: Allergies  Allergen Reactions   Codeine Anaphylaxis   Shellfish Allergy Swelling and Rash    Throat Sweling   Apple Cider Vinegar     syncope   Cinnamon Hives   Cozaar [Losartan]     Makes her feel like she is having a heart attack   Lisinopril     cough   Opium     Interferes with Keppra   Banana Rash    Cannot eat any while taking KEPPRA   Catfish [Fish Allergy] Rash   Orange Fruit [Citrus] Rash    Cannot have while taking KEPPRA   Tomato Itching and Rash    FAMILY HISTORY: Family History  Problem Relation Age of Onset   Asthma Mother    Hypertension Father    Heart disease Father    Stroke Father    Cancer Sister        uterus cancer    Diabetes Maternal Uncle    Breast cancer Cousin    Colon cancer Neg Hx     SOCIAL HISTORY: Social History   Socioeconomic History   Marital status: Divorced    Spouse name: Not on file   Number of children: 1   Years of education: Not on file   Highest education level: Not on file  Occupational History   Not on file  Tobacco Use   Smoking status: Never   Smokeless tobacco: Never  Vaping Use   Vaping Use: Never used  Substance and Sexual Activity   Alcohol use: No    Alcohol/week: 0.0 standard drinks of alcohol   Drug use: No   Sexual activity: Not Currently  Other Topics Concern   Not on file  Social History Narrative   Right handed    Lives alone    Lives in apartment  on 6th floor    No caffeine   Social Determinants of Health   Financial Resource Strain: Low Risk  (04/14/2022)   Overall Financial Resource Strain (CARDIA)    Difficulty of Paying Living Expenses: Not hard at all  Food Insecurity: Food Insecurity Present (04/14/2022)   Hunger Vital Sign    Worried About Running Out of Food in the Last Year: Sometimes true    Ran Out of Food in the Last Year:  Never true  Transportation Needs: No Transportation Needs (04/14/2022)    PRAPARE - Administrator, Civil Service (Medical): No    Lack of Transportation (Non-Medical): No  Physical Activity: Sufficiently Active (04/14/2022)   Exercise Vital Sign    Days of Exercise per Week: 5 days    Minutes of Exercise per Session: 30 min  Stress: Stress Concern Present (04/14/2022)   [employer] of Occupational Health - Occupational Stress Questionnaire    Feeling of Stress : To some extent  Social Connections: Socially Isolated (04/14/2022)   Social Connection and Isolation Panel [NHANES]    Frequency of Communication with Friends and Family: Twice a week    Frequency of Social Gatherings with Friends and Family: Twice a week    Attends Religious Services: Never    Database administrator or Organizations: No    Attends Banker Meetings: Never    Marital Status: Never married  Intimate Partner Violence: Not At Risk (04/14/2022)   Humiliation, Afraid, Rape, and Kick questionnaire    Fear of Current or Ex-Partner: No    Emotionally Abused: No    Physically Abused: No    Sexually Abused: No     PHYSICAL EXAM: Vitals:   07/07/22 0911  BP: (!) 149/61  Pulse: 60  SpO2: 96%   General: No acute distress Head:  Normocephalic/atraumatic Skin/Extremities: No rash, no edema Neurological Exam: alert and awake. No aphasia or dysarthria. Fund of knowledge is appropriate. Attention and concentration are normal.   Cranial nerves: Pupils equal, round. Extraocular movements intact with no nystagmus. Visual fields full.  No facial asymmetry.  Motor: Bulk and tone normal, muscle strength 5/5 throughout with no pronator drift.   Finger to nose testing intact.  Gait slow and cautious with cane, no ataxia. No shuffling gait. There is a slight postural tremor on the right 5th digit. No cogwheeling, good finger taps and RAMs.    IMPRESSION: This is a pleasant 67 yo RH woman with a history of hypertension and recurrent episodes of loss of consciousness with  report of right-sided symptoms initially, then symptoms changed, preceded by pain and discomfort in her extremities (LE>UE), or provoked by stress with her daughter. 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 No bigger events since her daughter moved to [place] (less stress), she has had 2 dizzy spells in one day last 6/16. Discussed that it may be due to her BP as well. Continue follow-up with PCP for BP control. Continue Levetiracetam ER 500mg  TID, refills have been sent by her PCP. She does not drive. Follow-up in 1 year, call for any changes.    Thank you for allowing me to participate in her care.  Please do not hesitate to call for any questions or concerns.    Patrcia Dolly, M.D.   CC: Dr. Delford Field

## 2022-07-07 NOTE — Patient Instructions (Addendum)
Always good to see you. Continue Keppra XR 500mg  three times a day. Continue to monitor BP with PCP. Continue follow-up with Behavioral Health. Follow-up in 1 year, call for any changes.

## 2022-07-09 ENCOUNTER — Ambulatory Visit (INDEPENDENT_AMBULATORY_CARE_PROVIDER_SITE_OTHER): Payer: Medicaid Other | Admitting: Licensed Clinical Social Worker

## 2022-07-09 DIAGNOSIS — F331 Major depressive disorder, recurrent, moderate: Secondary | ICD-10-CM

## 2022-07-13 ENCOUNTER — Ambulatory Visit (INDEPENDENT_AMBULATORY_CARE_PROVIDER_SITE_OTHER): Payer: Medicaid Other | Admitting: Licensed Clinical Social Worker

## 2022-07-13 ENCOUNTER — Ambulatory Visit (HOSPITAL_COMMUNITY): Payer: Medicaid Other | Admitting: Licensed Clinical Social Worker

## 2022-07-13 DIAGNOSIS — F331 Major depressive disorder, recurrent, moderate: Secondary | ICD-10-CM | POA: Diagnosis not present

## 2022-07-13 NOTE — Progress Notes (Unsigned)
   S:     No chief complaint on file.  67 y.o. female who presents for hypertension evaluation, education, and management.  PMH is significant for HTN, T2DM, hx seizure.  Patient was referred and last seen by Primary Care Provider, Dr. ***, on ***.  *** Patient was referred by *** on ***. Patient was last seen by Primary Care Provider, Dr. ***, on ***.   At last visit, ***.   Today, patient arrives in *** spirits and presents without *** assistance. *** Denies dizziness, headache, blurred vision, swelling.   Patient reports hypertension was diagnosed in ***.   Family/Social history:  - Fhx: Asthma, HTN, stroke, cancer - Tobacco: never smoker  Medication adherence *** . Patient has *** taken BP medications today.   Current antihypertensives include: amlodipine 10 mg, carvedilol 25 mg daily, spironolactione 50 mg daily, hydralazine 50 mg daily  Antihypertensives tried in the past include: ***  Reported home BP readings: ***  Patient reported dietary habits: Eats *** meals/day Breakfast: *** Lunch: *** Dinner: *** Snacks: *** Drinks: ***  Patient-reported exercise habits: ***  ASCVD risk factors include: ***  O:  ROS  Physical Exam  Last 3 Office BP readings: BP Readings from Last 3 Encounters:  07/07/22 (!) 149/61  06/04/22 (!) 184/97  04/14/22 (!) 175/89    BMET    Component Value Date/Time   NA 141 06/04/2022 1342   K 4.2 06/04/2022 1342   CL 103 06/04/2022 1342   CO2 23 06/04/2022 1342   GLUCOSE 79 06/04/2022 1342   GLUCOSE 86 02/18/2016 0948   BUN 11 06/04/2022 1342   CREATININE 0.77 06/04/2022 1342   CREATININE 0.89 02/18/2016 0948   CALCIUM 9.9 06/04/2022 1342   GFRNONAA 60 12/26/2019 0949   GFRNONAA 73 08/17/2014 1013   GFRAA 69 12/26/2019 0949   GFRAA 84 08/17/2014 1013    Renal function: CrCl cannot be calculated (Patient's most recent lab result is older than the maximum 21 days allowed.).  Clinical ASCVD: {YES/NO:21197} The 10-year  ASCVD risk score (Arnett DK, et al., 2019) is: 21.5%   Values used to calculate the score:     Age: 32 years     Sex: Female     Is Non-Hispanic African American: Yes     Diabetic: Yes     Tobacco smoker: No     Systolic Blood Pressure: 149 mmHg     Is BP treated: Yes     HDL Cholesterol: 49 mg/dL     Total Cholesterol: 144 mg/dL  Patient is participating in a Managed Medicaid Plan:  {MM YES/NO:27447::"Yes"}    A/P: Hypertension diagnosed *** currently *** on current medications. BP goal < 130/80 *** mmHg. Medication adherence appears ***. Control is suboptimal due to ***.  -{Meds adjust:18428} ***.  -{Meds adjust:18428} ***.  -Patient educated on purpose, proper use, and potential adverse effects of ***.  -F/u labs ordered - *** -Counseled on lifestyle modifications for blood pressure control including reduced dietary sodium, increased exercise, adequate sleep. -Encouraged patient to check BP at home and bring log of readings to next visit. Counseled on proper use of home BP cuff.   Results reviewed and written information provided.    Written patient instructions provided. Patient verbalized understanding of treatment plan.  Total time in face to face counseling *** minutes.    Follow-up:  Pharmacist ***. PCP clinic visit in ***.  Patient seen with ***

## 2022-07-14 ENCOUNTER — Ambulatory Visit: Payer: Medicaid Other | Attending: Family Medicine | Admitting: Pharmacist

## 2022-07-14 ENCOUNTER — Ambulatory Visit (HOSPITAL_COMMUNITY): Payer: Medicaid Other | Admitting: Licensed Clinical Social Worker

## 2022-07-14 ENCOUNTER — Encounter: Payer: Self-pay | Admitting: Pharmacist

## 2022-07-14 ENCOUNTER — Other Ambulatory Visit: Payer: Self-pay

## 2022-07-14 DIAGNOSIS — I10 Essential (primary) hypertension: Secondary | ICD-10-CM | POA: Diagnosis not present

## 2022-07-14 MED ORDER — CARVEDILOL 25 MG PO TABS
25.0000 mg | ORAL_TABLET | Freq: Two times a day (BID) | ORAL | 1 refills | Status: DC
  Filled 2022-07-14: qty 180, 90d supply, fill #0

## 2022-07-14 MED ORDER — HYDRALAZINE HCL 50 MG PO TABS
50.0000 mg | ORAL_TABLET | Freq: Three times a day (TID) | ORAL | 1 refills | Status: DC
Start: 2022-07-14 — End: 2022-09-15
  Filled 2022-07-14: qty 270, 90d supply, fill #0

## 2022-07-17 ENCOUNTER — Other Ambulatory Visit: Payer: Self-pay

## 2022-07-21 ENCOUNTER — Ambulatory Visit (HOSPITAL_COMMUNITY): Payer: Medicare Other | Admitting: Licensed Clinical Social Worker

## 2022-07-21 DIAGNOSIS — F331 Major depressive disorder, recurrent, moderate: Secondary | ICD-10-CM | POA: Diagnosis not present

## 2022-07-21 DIAGNOSIS — F5101 Primary insomnia: Secondary | ICD-10-CM | POA: Diagnosis not present

## 2022-07-22 ENCOUNTER — Other Ambulatory Visit: Payer: Self-pay

## 2022-07-28 ENCOUNTER — Ambulatory Visit (HOSPITAL_COMMUNITY): Payer: Medicaid Other | Admitting: Licensed Clinical Social Worker

## 2022-08-04 ENCOUNTER — Ambulatory Visit (HOSPITAL_COMMUNITY): Payer: Medicaid Other | Admitting: Licensed Clinical Social Worker

## 2022-08-04 ENCOUNTER — Encounter (HOSPITAL_COMMUNITY): Payer: Self-pay | Admitting: Licensed Clinical Social Worker

## 2022-08-04 NOTE — Progress Notes (Signed)
Virtual Visit via Telephone Note  I connected with [name] on 07/09/2022 at 2:00-3:00 pm by phone enabled telehealth application and verified that I am speaking withthe correct person using two identifiers. Patient does not have a computer or Internet.   I discussed the limitations of evaluation and management by telemedicine and the availability of in person appointments. The patient expressed understanding and agreed to proceed.  LOCATION:  Patient: Home Provider: Home Office  Treatment Goal Addressed: Develop the ability to recognize, accept, and cope with feelings of depression AEB self report.  Progress Towards Goal: Progressing   History of Present Illness: Pt was referred for OP therapy by her neurologist Dr. [place] Lions for depression.   Observations/Objective: Patient presented for today'[name] session on time and was alert, oriented x5, with no evidence or self-report of SI/HI or A/V H.  Patient reported ongoing compliance with medication and denied any use of alcohol or illicit substances. Clinician inquired about patient'[name] current emotional ratings, as well as any significant changes in thoughts, feelings or behaviors. Pt reports on her family which causes her stress. Pt reports 5/10 for depression and 4/10 for anxiety and 1/10 for irritability. " I started walking again early in the morning before it gets too hot. This is to help my bp and diabetes." Cln congratulated pt on  her wellness program she is working on. Cln and pt reviewed her wellness plan: exercise, getting enough sleep, eating more healthy, keeping toxic people out of her life. "My numbers have been good at my dr'[name] offices so they know I'm doing something different: wellness plan." Cln encouraged pt to keep doing the hard work and she will continue to see the benefits.    Collaboration of Care: Other: Continue to see Dr. Michae Kava and Dr.  Lions.  Patient/Guardian was advised Release of Information must be obtained  prior to any record release in order to collaborate their care with an outside provider. Patient/Guardian was advised if they havt go because all the stress would have affected me negatively. Cln asked open ended questions.  e not already done so to contact the registration department to sign all necessary forms in order for [place] to release information regarding their care.   Consent: Patient/Guardian gives verbal consent for treatment and assignment of benefits for services provided during this visit. Patient/Guardian expressed understanding and agreed to proceed.      Assessment and plan: Counselor will continue to meet with patient to address treatment plan goals. Patient will continue to follow recommendations of providers and implement skills learned in session.   Follow Up Instructions:   I discussed the assessment and treatment plan with the patient. The patient was provided an opportunity to ask questions and all were answered. The patient agreed with the plan and demonstrated an understanding of the instructions.   The patient was advised to call back or seek an in-person evaluation if the symptoms worsen or if the condition fails to improve as anticipated.  I provided 60 minutes of non-face-to-face time during this encounter.   [name], LCAS

## 2022-08-05 ENCOUNTER — Encounter (HOSPITAL_COMMUNITY): Payer: Self-pay | Admitting: Licensed Clinical Social Worker

## 2022-08-05 NOTE — Progress Notes (Signed)
Virtual Visit via Telephone Note  I connected with [name] on 07/21/2022 at 11:00-12:00 pm by phone enabled telehealth application and verified that I am speaking withthe correct person using two identifiers. Patient does not have a computer or Internet.   I discussed the limitations of evaluation and management by telemedicine and the availability of in person appointments. The patient expressed understanding and agreed to proceed.  LOCATION:  Patient: Home Provider: Home Office  Treatment Goal Addressed: Develop the ability to recognize, accept, and cope with feelings of depression AEB self report.  Progress Towards Goal: Progressing   History of Present Illness: Pt was referred for OP therapy by her neurologist Dr. [place] Lions for depression.   Observations/Objective: Patient presented for today's session on time and was alert, oriented x5, with no evidence or self-report of SI/HI or A/V H.  Patient reported ongoing compliance with medication and denied any use of alcohol or illicit substances. Clinician inquired about patient's current emotional ratings, as well as any significant changes in thoughts, feelings or behaviors. Pt reports on her family which causes her stress. Pt reports 5/10 for depression and 4/10 for anxiety and 1/10 for irritability. " I continue walking again early in the morning before it gets too hot. This is to help my bp and diabetes.I'm going to go to [place] to spend some time with my sister. Cln engaged Patient in discussion on identified positives to assess how her mood has been impacted. Clinician allowed space for pt to further process emotions and clinician provided supportive statements throughout session.. Cln suggested to patient to continue to identify positives that  impact her mood. Pt learned how to recognize the physical, cognitive, emotional, and behavioral responses.   Collaboration of Care: Other: Continue to see Dr. Michae Kava and Dr.  [place] Lions.  Patient/Guardian was advised Release of Information must be obtained prior to any record release in order to collaborate their care with an outside provider. Patient/Guardian was advised if they havt go because all the stress would have affected me negatively. Cln asked open ended questions.  e not already done so to contact the registration department to sign all necessary forms in order for [place] to release information regarding their care.   Consent: Patient/Guardian gives verbal consent for treatment and assignment of benefits for services provided during this visit. Patient/Guardian expressed understanding and agreed to proceed.      Assessment and plan: Counselor will continue to meet with patient to address treatment plan goals. Patient will continue to follow recommendations of providers and implement skills learned in session.   Follow Up Instructions:   I discussed the assessment and treatment plan with the patient. The patient was provided an opportunity to ask questions and all were answered. The patient agreed with the plan and demonstrated an understanding of the instructions.   The patient was advised to call back or seek an in-person evaluation if the symptoms worsen or if the condition fails to improve as anticipated.  I provided 60 minutes of non-face-to-face time during this encounter.   Abrar Bilton S, LCAS

## 2022-08-05 NOTE — Progress Notes (Signed)
Virtual Visit via Telephone Note  I connected with [name] on 07/13/2022 at 4:00-5:00 pm by phone enabled telehealth application and verified that I am speaking withthe correct person using two identifiers. Patient does not have a computer or Internet.   I discussed the limitations of evaluation and management by telemedicine and the availability of in person appointments. The patient expressed understanding and agreed to proceed.  LOCATION:  Patient: Home Provider: Home Office  Treatment Goal Addressed: Develop the ability to recognize, accept, and cope with feelings of depression AEB self report.  Progress Towards Goal: Progressing   History of Present Illness: Pt was referred for OP therapy by her neurologist Dr. [place] Lions for depression.   Observations/Objective: Patient presented for today's session on time and was alert, oriented x5, with no evidence or self-report of SI/HI or A/V H.  Patient reported ongoing compliance with medication and denied any use of alcohol or illicit substances. Clinician inquired about patient's current emotional ratings, as well as any significant changes in thoughts, feelings or behaviors. Pt reports on her family which causes her stress. Pt reports 5/10 for depression and 4/10 for anxiety and 1/10 for irritability. " I continue walking again early in the morning before it gets too hot. This is to help my bp and diabetes." Cln congratulated pt on  her wellness program she is working on. Cln and pt reviewed her progress in her wellness plan: exercise, getting enough sleep, eating more healthy, keeping toxic people out of her life. Cln and pt reviewed nutrition, including salt intake.." Cln encouraged pt to keep doing the hard work and she will continue to see the benefits.    Collaboration of Care: Other: Continue to see Dr. Michae Kava and Dr. [place] Lions.  Patient/Guardian was advised Release of Information must be obtained prior to any record release in order  to collaborate their care with an outside provider. Patient/Guardian was advised if they havt go because all the stress would have affected me negatively. Cln asked open ended questions.  e not already done so to contact the registration department to sign all necessary forms in order for [place] to release information regarding their care.   Consent: Patient/Guardian gives verbal consent for treatment and assignment of benefits for services provided during this visit. Patient/Guardian expressed understanding and agreed to proceed.      Assessment and plan: Counselor will continue to meet with patient to address treatment plan goals. Patient will continue to follow recommendations of providers and implement skills learned in session.   Follow Up Instructions:   I discussed the assessment and treatment plan with the patient. The patient was provided an opportunity to ask questions and all were answered. The patient agreed with the plan and demonstrated an understanding of the instructions.   The patient was advised to call back or seek an in-person evaluation if the symptoms worsen or if the condition fails to improve as anticipated.  I provided 60 minutes of non-face-to-face time during this encounter.   [name] S, LCAS

## 2022-08-11 ENCOUNTER — Ambulatory Visit (INDEPENDENT_AMBULATORY_CARE_PROVIDER_SITE_OTHER): Payer: Medicare Other | Admitting: Licensed Clinical Social Worker

## 2022-08-11 ENCOUNTER — Encounter (HOSPITAL_COMMUNITY): Payer: Self-pay | Admitting: Licensed Clinical Social Worker

## 2022-08-11 DIAGNOSIS — F331 Major depressive disorder, recurrent, moderate: Secondary | ICD-10-CM

## 2022-08-11 NOTE — Progress Notes (Signed)
Virtual Visit via Telephone Note  I connected with [name] on 08/11/2022 at 11:00-12::00 pm by phone enabled telehealth application and verified that I am speaking withthe correct person using two identifiers. Patient does not have a computer or Internet.   I discussed the limitations of evaluation and management by telemedicine and the availability of in person appointments. The patient expressed understanding and agreed to proceed.  LOCATION:  Patient: Home Provider: Home Office  Treatment Goal Addressed: Develop the ability to recognize, accept, and cope with feelings of depression AEB self report.  Progress Towards Goal: Progressing   History of Present Illness: Pt was referred for OP therapy by her neurologist Dr. [place]ta Lions for depression.   Observations/Objective: Patient presented for today's session on time and was alert, oriented x5, with no evidence or self-report of SI/HI or A/V H. Patient reported ongoing compliance with medication and denied any use of alcohol or illicit substances. Clinician inquired about patient's current emotional ratings, as well as any significant changes in thoughts, feelings or behaviors. Pt reports on her family which causes her stress. Pt reports 4/10 for depression and 4/10 for anxiety and 1/10 for irritability. " I continue walking again early in the morning before it gets too hot. This is to help my bp and diabetes."Cln congratulated pt on  her wellness program she is working on. Cln and pt reviewed her progress in her wellness plan: exercise, getting enough sleep, eating more healthy, keeping toxic people out of her life. Cln and pt reviewed nutrition, including salt intake.." Cln encouraged pt to keep doing the hard work and she will continue to see the benefits. Pt  did not go to [facility] due to her sister/brother in law having to come here to help another sibling move. "It was good visiting with part of my family for my birthday!"   Diagnosis:  depression  Collaboration of Care: Other: Continue to see Dr. Michae Kava and Dr. [place] Lions.  Patient/Guardian was advised Release of Information must be obtained prior to any record release in order to collaborate their care with an outside provider.    Patient/guardian was advised if not not already done so to contact the registration department to sign all necessary forms in order for [place] to release information regarding their care.   Consent: Patient/Guardian gives verbal consent for treatment and assignment of benefits for services provided during this visit. Patient/Guardian expressed understanding and agreed to proceed.      Assessment and plan: Counselor will continue to meet with patient to address treatment plan goals. Patient will continue to follow recommendations of providers and implement skills learned in session.   Follow Up Instructions:   I discussed the assessment and treatment plan with the patient. The patient was provided an opportunity to ask questions and all were answered. The patient agreed with the plan and demonstrated an understanding of the instructions.   The patient was advised to call back or seek an in-person evaluation if the symptoms worsen or if the condition fails to improve as anticipated.  I provided 60 minutes of non-face-to-face time during this encounter.   Mahari Strahm S, LCAS

## 2022-08-18 ENCOUNTER — Ambulatory Visit: Payer: Medicare Other | Attending: Family Medicine | Admitting: Pharmacist

## 2022-08-18 ENCOUNTER — Other Ambulatory Visit: Payer: Self-pay

## 2022-08-18 ENCOUNTER — Encounter: Payer: Self-pay | Admitting: Pharmacist

## 2022-08-18 VITALS — BP 144/72 | HR 59

## 2022-08-18 DIAGNOSIS — I10 Essential (primary) hypertension: Secondary | ICD-10-CM | POA: Diagnosis not present

## 2022-08-18 NOTE — Progress Notes (Signed)
S:     No chief complaint on file.  67 y.o. female who presents for hypertension evaluation, education, and management.  PMH is significant for HTN, T2DM, hx seizure, MDD.   Patient was referred and last seen by Primary Care Provider, Dr. Delford Field, on 06/04/2022.  At that visit, BP 184/97. Spironolactone increased from 25 mg to 50 mg daily. We saw her on 07/14/2022 and her BP was at goal.   Today, patient arrives in good spirits and presents without assistance. Denies dizziness, headache, blurred vision, swelling. Patient reports adherence with taking her medications as directed. She has taken her BP medications today but took a little later in the morning than usual.   Current antihypertensives include: amlodipine 10 mg daily, carvedilol 25 mg BID, spironolactione 50 mg daily, hydralazine 50 mg TID  Antihypertensives tried in the past include: lisinopril 40 mg daily, losartan 50 mg daily  Family/Social history:  - Fhx: Asthma, HTN, stroke, cancer - Tobacco: never smoker  Patient reported dietary and exercise habits: No changes from last visit  ASCVD risk factors include: HTN, T2DM, family history  O:  Vitals:   08/18/22 0937  BP: (!) 144/72  Pulse: (!) 59   Last 3 Office BP readings: BP Readings from Last 3 Encounters:  08/18/22 (!) 144/72  07/14/22 132/75  07/07/22 (!) 149/61   BMET    Component Value Date/Time   NA 141 06/04/2022 1342   K 4.2 06/04/2022 1342   CL 103 06/04/2022 1342   CO2 23 06/04/2022 1342   GLUCOSE 79 06/04/2022 1342   GLUCOSE 86 02/18/2016 0948   BUN 11 06/04/2022 1342   CREATININE 0.77 06/04/2022 1342   CREATININE 0.89 02/18/2016 0948   CALCIUM 9.9 06/04/2022 1342   GFRNONAA 60 12/26/2019 0949   GFRNONAA 73 08/17/2014 1013   GFRAA 69 12/26/2019 0949   GFRAA 84 08/17/2014 1013    Renal function: CrCl cannot be calculated (Patient's most recent lab result is older than the maximum 21 days allowed.).  Clinical ASCVD: No  The 10-year  ASCVD risk score (Arnett DK, et al., 2019) is: 20.7%   Values used to calculate the score:     Age: 32 years     Sex: Female     Is Non-Hispanic African American: Yes     Diabetic: Yes     Tobacco smoker: No     Systolic Blood Pressure: 144 mmHg     Is BP treated: Yes     HDL Cholesterol: 49 mg/dL     Total Cholesterol: 144 mg/dL  Patient is participating in a Managed Medicaid Plan: Yes    A/P: Hypertension diagnosed currently near goal on current medications. BP goal < 130/80 mmHg. Medication adherence appears appropriate. Will have her return in 1 month for recheck. -Increased dose of spironolactone 50 mg daily as prescribed.  -Continued amlodipine 10 mg daily, carvedilol 25 mg BID, hydralazine 50 mg TID.  -Patient educated on purpose, proper use, and potential adverse effects.  -F/u labs ordered - none -Counseled on lifestyle modifications for blood pressure control including reduced dietary sodium, increased exercise, adequate sleep. -Encouraged patient to check BP at home and bring log of readings to next visit. Counseled on proper use of home BP cuff.   Results reviewed and written information provided.    Written patient instructions provided. Patient verbalized understanding of treatment plan.  Total time in face to face counseling 20 minutes.    Follow-up:  Pharmacist clinic visit in 1 month.  Butch Penny, PharmD, Patsy [place], CPP Clinical Pharmacist [facility] [phone]

## 2022-08-19 ENCOUNTER — Other Ambulatory Visit: Payer: Self-pay

## 2022-08-19 ENCOUNTER — Ambulatory Visit: Payer: Medicaid Other | Admitting: Critical Care Medicine

## 2022-08-25 ENCOUNTER — Ambulatory Visit (HOSPITAL_COMMUNITY): Payer: 59 | Admitting: Licensed Clinical Social Worker

## 2022-08-25 ENCOUNTER — Encounter (HOSPITAL_COMMUNITY): Payer: Self-pay | Admitting: Licensed Clinical Social Worker

## 2022-08-25 DIAGNOSIS — F331 Major depressive disorder, recurrent, moderate: Secondary | ICD-10-CM | POA: Diagnosis not present

## 2022-08-25 NOTE — Progress Notes (Signed)
Virtual Visit via Telephone Note  I connected with [name] on 08/25/2022 at 11:00-11:45 am by phone enabled telehealth application and verified that I am speaking withthe correct person using two identifiers. Patient does not have a computer or Internet.   I discussed the limitations of evaluation and management by telemedicine and the availability of in person appointments. The patient expressed understanding and agreed to proceed.  LOCATION:  Patient: Home Provider: Home Office  Treatment Goal Addressed: Develop the ability to recognize, accept, and cope with feelings of depression AEB self report.  Progress Towards Goal: Progressing   History of Present Illness: Pt was referred for OP therapy by her neurologist Dr. [place] [name] for depression.   Observations/Objective: Patient presented for today's session on time and was alert, oriented x5, with no evidence or self-report of SI/HI or A/V H. Patient reported ongoing compliance with medication and denied any use of alcohol or illicit substances. Clinician inquired about patient's current emotional ratings, as well as any significant changes in thoughts, feelings or behaviors. Pt reports on her family which causes her stress. Pt reports 4/10 for depression and 4/10 for anxiety and 1/10 for irritability. " I continue walking again early in the morning before it gets too hot. I have now joined [employer] and walk on the treadmill for 1 hour daily. Cln asked open ended questions. "Im trying to lose weight." Cln congratulated pt on her goal to lose weight. "This continues to help my bp and diabetes and keeps me less depressed. " Cln and pt again reviewed her progress in her wellness plan: exercise, getting enough sleep, eating more healthy, keeping toxic people out of her life. Cln and pt reviewed nutrition, eating healthy." Cln reviewed her wellness plan and explained the cycle of depression.    Collaboration of Care: Other: Continue to see  Dr. Michae [name] and Dr. [employer] [name].  Patient/Guardian was advised Release of Information must be obtained prior to any record release in order to collaborate their care with an outside provider.    Patient/guardian was advised if not not already done so to contact the registration department to sign all necessary forms in order for [place] to release information regarding their care.   Consent: Patient/Guardian gives verbal consent for treatment and assignment of benefits for services provided during this visit. Patient/Guardian expressed understanding and agreed to proceed.      Assessment and plan: Counselor will continue to meet with patient to address treatment plan goals. Patient will continue to follow recommendations of providers and implement skills learned in session and practice skills between sessions. Diagnosis: Depression  Follow Up Instructions:   I discussed the assessment and treatment plan with the patient. The patient was provided an opportunity to ask questions and all were answered. The patient agreed with the plan and demonstrated an understanding of the instructions.   The patient was advised to call back or seek an in-person evaluation if the symptoms worsen or if the condition fails to improve as anticipated.  I provided 45 minutes of non-face-to-face time during this encounter.   [name] S, LCAS

## 2022-09-01 ENCOUNTER — Ambulatory Visit (INDEPENDENT_AMBULATORY_CARE_PROVIDER_SITE_OTHER): Payer: Medicaid Other | Admitting: Licensed Clinical Social Worker

## 2022-09-01 DIAGNOSIS — F331 Major depressive disorder, recurrent, moderate: Secondary | ICD-10-CM | POA: Diagnosis not present

## 2022-09-03 ENCOUNTER — Ambulatory Visit (HOSPITAL_COMMUNITY): Payer: Medicaid Other | Admitting: Psychiatry

## 2022-09-04 ENCOUNTER — Other Ambulatory Visit (HOSPITAL_COMMUNITY): Payer: Self-pay

## 2022-09-04 ENCOUNTER — Telehealth: Payer: Self-pay | Admitting: Pharmacy Technician

## 2022-09-04 NOTE — Telephone Encounter (Signed)
Pharmacy Patient Advocate Encounter   Received notification from Physician's Office that prior authorization for LEVETIRACETAM ER 500MG  is required/requested.   Insurance verification completed.   The patient is insured through [facility] .   Per test claim: CANCELLED due to

## 2022-09-08 ENCOUNTER — Ambulatory Visit (INDEPENDENT_AMBULATORY_CARE_PROVIDER_SITE_OTHER): Payer: 59 | Admitting: Licensed Clinical Social Worker

## 2022-09-08 DIAGNOSIS — F331 Major depressive disorder, recurrent, moderate: Secondary | ICD-10-CM

## 2022-09-10 ENCOUNTER — Ambulatory Visit: Payer: Medicaid Other

## 2022-09-10 NOTE — Patient Instructions (Signed)
Tailored Plan Medicaid On July 1, some people on [place] [employer] will move to a new kind of Medicaid health plan called a Tailored Plan. Tailored Plans cover your doctor visits, prescription drugs, and health care services.    If your [place] Medicaid will move to a Tailored Plan, you should have gotten a letter and welcome packet. If you're not sure, call your [place] Medicaid Enrollment Broker at [phone] and ask.  Check out these free materials, in [place] and [place], to learn more about your Tailored Plan: [email or web address]/Tailored-Plans/Toolkit  Tailored Care Management Services  TCM services are available to you now. If you are a Tailored Plan member or will be and want information about Tailored Care Management Services including rides to appointments and community and home services, call the Care Management provider for your county of residence:    [facility] ([place], [place])  Member Services: [phone] Behavioral Health Crisis Line: 9081880558, [place], [place], [place], [place])  Member Services: 6198549866 Behavioral Health Crisis Line: 860-542-3988  [facility] [place]) Member Services: [phone] Behavioral Health Crisis Line: [phone]

## 2022-09-10 NOTE — Patient Outreach (Signed)
Medicaid Managed Care Social Work Note  09/10/2022 Name:  [name] MRN:  [id number] DOB:  07-15-1955  [name] is an 67 y.o. year old female who is a primary patient of [name], MD.  The Medicaid Managed Care Coordination team was consulted for assistance with:  Community Resources   [name] was given information about Medicaid Managed Care Coordination team services today. [name] Patient agreed to services and verbal consent obtained.  Engaged with patient  for by telephone forfollow up visit in response to referral for case management and/or care coordination services.   Assessments/Interventions:  Review of past medical history, allergies, medications, health status, including review of consultants reports, laboratory and other test data, was performed as part of comprehensive evaluation and provision of chronic care management services.  SDOH: (Social Determinant of Health) assessments and interventions performed: SDOH Interventions    Flowsheet Row Office Visit from 04/14/2022 in [facility] Patient Outreach from 06/27/2021 in [facility] Community Care Coordination  SDOH Interventions    Financial Strain Interventions Other (Comment)  [Ensured that patient's rx needs are being met. She has no current issues with medication fills and denies any cost-prohibitive issues with her medications.] Intervention Not Indicated      BSW completed a telephone outreach with patient, sh estates she did recieev the resources BSW sent, but she no longer has [employer], patient now have [employer]. Patient reports now resources are needed at this time. Advanced Directives Status:  Not addressed in this encounter.  Care Plan                 Allergies  Allergen Reactions   Codeine Anaphylaxis   Shellfish Allergy Swelling and Rash    Throat Sweling   Apple Cider Vinegar     syncope   Cinnamon Hives   Cozaar [Losartan]      Makes her feel like she is having a heart attack   Lisinopril     cough   Opium     Interferes with Keppra   Banana Rash    Cannot eat any while taking KEPPRA   Catfish [Fish Allergy] Rash   Orange Fruit [Citrus] Rash    Cannot have while taking KEPPRA   Tomato Itching and Rash    Medications Reviewed Today   Medications were not reviewed in this encounter     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) 02/24/2022   Controlled type 2 diabetes mellitus without complication, without long-term current use of insulin (HCC) 02/24/2022   Influenza vaccine refused 12/26/2019   Adjustment disorder with anxious mood 08/18/2018   Obesity (BMI 30-39.9) 09/17/2016   Internal hemorrhoid 09/16/2016   Adjustment disorder with mixed anxiety and depressed mood 12/04/2015   Primary insomnia 12/04/2015   Seizures (HCC) 08/24/2013   Class 2 severe obesity due to excess calories with serious comorbidity and body mass index (BMI) of 38.0 to 38.9 in adult (HCC)    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   Follow up:  Patient agrees to Care Plan and Follow-up.  Plan: The  Managed Medicaid care management team will reach out to the patient again over the next 30-45 days.  Date/time of next scheduled Social Work care management/care coordination outreach:  10/13/22  Gus Puma, Kenard Gower, [facility]  Managed [facility] Social Worker [phone]

## 2022-09-13 ENCOUNTER — Encounter (HOSPITAL_COMMUNITY): Payer: Self-pay | Admitting: Licensed Clinical Social Worker

## 2022-09-13 NOTE — Progress Notes (Unsigned)
Established Patient Office Visit  Subjective   Patient ID: [name], female    DOB: [birth year]  Age: 67 y.o. MRN: [id number]  No chief complaint on file.     5/23 This is a 67 year old female last seen in January by PA Mcclung previously by Dr. Laural Benes for diabetes and hypertension.  She has more of a prediabetic condition but comes today with a blood pressure initially of 209/112 on recheck was 190/110.  She was in and out of her blood pressure medications for about a week.  She does not have a blood pressure meter at this time she lost the one she had.  She is supposed to be on hydralazine 50 mg twice a day amlodipine 10 mg a day and hydrochlorothiazide 25 mg a day.  She had normal readings last fall but these have increased since that she had lost her medication and lost her blood pressure meter.  Patient has carried a diagnosis of diabetes along a prediabetic range.  Last A1c 4 months ago was 6.0  Patient had an appointment in April with Dr. Laural Benes mid left without being seen when the office was busy for the patient to wait  This patient is also on Keppra for seizures  The patient states her mental health is good she has a counselor and mental health provider  Patient has been seizure-free  Patient does note a rash on the face.  Patient does have elevated cholesterol.  Primarily has triglycerides  10/3  Patient states since last visit she has done very well on arrival blood pressure is much improved 135/71.  She is looking for a new blood pressure meter and would like to get the [employer] she was given different locations to try to find 1.  She states her acne is improved.  She does have some foot pain and is followed by podiatry.  There are no other complaints.   Below is documentation from clinical pharmacy earlier last month Seen by [facility] CPP 09/25/2021 Hypertension longstanding currently close to goal on current medications. BP goal < 130/80 mmHg. Medication adherence appears  optimal. Given her frequency of dizzy spells, BP close to goal, and that she does not have a working BP cuff at this time, no changes were made to her home regimen.  -Continued amlodipine 10mg  once daily at bedtime -Continued carvedilol 12.5mg  BID -Continued hydralazine 50mg  TID -Continued spironolactone 25mg  once daily.  -Encouraged her to take her BP at home, especially when she experiences dizziness and bring a log of the readings to her next visit.   Patient states she is no longer having dizziness with her condition.  She does need to have a urine for microalbumin note her A1c is 6.0 recently and electrolytes were normal as was kidney function.   06/04/22 Essential hypertension - Primary (Chronic)       Plan for this patient will be to increase carvedilol to 25 mg twice daily she will continue amlodipine hydralazine Aldactone as it currently is dosed.   Patient may yet need an ARB   Check renal panel this visit   Return short-term follow-up with clinical pharmacy then Dr. Delford Field short-term after that visit   The following Lifestyle Medicine recommendations according to American College of Lifestyle Medicine [facility]) were discussed and offered to patient who agrees to start the journey:  A. Whole Foods, Plant-based plate comprising of fruits and vegetables, plant-based proteins, whole-grain carbohydrates was discussed in detail with the patient.   A list for source  of those nutrients were also provided to the patient.  Patient will use only water or unsweetened tea for hydration. B.  The need to stay away from risky substances including alcohol, smoking; obtaining 7 to 9 hours of restorative sleep, at least 150 minutes of moderate intensity exercise weekly, the importance of healthy social connections,  and stress reduction techniques were discussed. C.  A full color page of  Calorie density of various food groups per pound showing examples of each food groups was provided to the patient.           Relevant Medications   amLODipine (NORVASC) 10 MG tablet   carvedilol (COREG) 25 MG tablet   hydrALAZINE (APRESOLINE) 50 MG tablet   spironolactone (ALDACTONE) 25 MG tablet   Other Relevant Orders   Renal Function Panel      Endocrine   Diabetic mononeuropathy associated with type 2 diabetes mellitus (HCC)   Relevant Medications   levETIRAcetam (KEPPRA XR) 500 MG 24 hr tablet   metFORMIN (GLUCOPHAGE) 500 MG tablet   Other Relevant Orders   Hemoglobin A1c   Controlled type 2 diabetes mellitus without complication, without long-term current use of insulin (HCC)      Diabetes currently controlled with hemoglobin A1c of 6 we will continue to monitor with current medication Foot exam was normal      Relevant Medications   metFORMIN (GLUCOPHAGE) 500 MG tablet   Other Relevant Orders   Hemoglobin A1c      Other   Seizures (HCC) (Chronic)      No active seizures continue Keppra      Relevant Medications   levETIRAcetam (KEPPRA XR) 500 MG 24 hr tablet   Class 2 severe obesity due to excess calories with serious comorbidity and body mass index (BMI) of 38.0 to 38.9 in adult [facility])   Relevant Medications   metFORMIN (GLUCOPHAGE) 500 MG tablet   Major depressive disorder, recurrent episode, moderate (HCC)   Obesity (BMI 30-39.9)      Patient was given lifestyle medicine approach and education The following Lifestyle Medicine recommendations according to American College of Lifestyle Medicine [facility]) were discussed and offered to patient who agrees to start the journey:  A. Whole Foods, Plant-based plate comprising of fruits and vegetables, plant-based proteins, whole-grain carbohydrates was discussed in detail with the patient.   A list for source of those nutrients were also provided to the patient.  Patient will use only water or unsweetened tea for hydration. B.  The need to stay away from risky substances including alcohol, smoking; obtaining 7 to 9 hours of restorative  sleep, at least 150 minutes of moderate intensity exercise weekly, the importance of healthy social connections,  and stress reduction techniques were discussed. C.  A full color page of  Calorie density of various food groups per pound showing examples of each food groups was provided to the patient.        Relevant Medications   metFORMIN (GLUCOPHAGE) 500 MG tablet   RESOLVED: Prediabetes (Chronic)   Relevant Medications   metFORMIN (GLUCOPHAGE) 500 MG tablet   Other Visit Diagnoses      Vitamin D deficiency       Relevant Orders   VITAMIN D 25 Hydroxy (Vit-D Deficiency, Fractures)   Per luke 03/2022 Expand All Collapse All    S:    No chief complaint on file.   67 y.o. female who presents for hypertension evaluation, education, and management.  PMH is significant for HTN, T2DM, seizures,  MDD.  Patient was referred and last seen by Primary Care Provider, Dr. Delford Field, on 02/24/22. We saw her last month and BP was at goal.   Today, patient arrives in good spirits and presents without assistance. Denies dizziness, headache, blurred vision, swelling. She does tell me she has a couple of visits today and did not sleep well last night in anticipation of her visits today. Additionally, she ate some chicken soup for her breakfast this morning.    Family history: HTN, heart disease, stroke Social history: No tobacco use history.   Medication adherence appears appropriate, however, she tells me she missed her morning doses of medication this Saturday. Patient has taken BP medications today. She restricts sodium but tells me she ate chicken soup this morning for breakfast.    Current antihypertensives include: amlodipine 10 mg daily, carvedilol 25 mg BID, hydralazine 50 mg TID, spironolactone 25 mg daily   Antihypertensives tried in the past include: lisinopril (cough) and losartan (heart racing)   Patient reported dietary habits: Endorses restricting salt in her diet and cooking at  home.   Patient-reported exercise habits:  -Since last visit, she is walking daily for about ~30 minutes daily (sometimes between 40-50 minutes depending on foot pain)   ASCVD risk factors include: T2DM, HTN, family history   O:  Patient has a Archivist.    Vitals:   04/14/22 0919 BP: (!) 175/89 Pulse: (!) 54   Last 3 Office BP readings:  BP Readings from Last 3 Encounters: 04/14/22 (!) 175/89 03/10/22 126/77 02/24/22 (!) 216/104     BMET  Labs (Brief)   Component Value Date/Time   NA 143 02/24/2022 1010   K 3.9 02/24/2022 1010   CL 104 02/24/2022 1010   CO2 24 02/24/2022 1010   GLUCOSE 86 02/24/2022 1010   GLUCOSE 86 02/18/2016 0948   BUN 7 (L) 02/24/2022 1010   CREATININE 0.79 02/24/2022 1010   CREATININE 0.89 02/18/2016 0948   CALCIUM 9.6 02/24/2022 1010   GFRNONAA 60 12/26/2019 0949   GFRNONAA 73 08/17/2014 1013   GFRAA 69 12/26/2019 0949   GFRAA 84 08/17/2014 1013      Renal function: CrCl cannot be calculated (Patient's most recent lab result is older than the maximum 21 days allowed.).   Clinical ASCVD: No  The 10-year ASCVD risk score (Arnett DK, et al., 2019) is: 30.6%   Values used to calculate the score:     Age: 41 years     Sex: Female     Is Non-Hispanic African American: Yes     Diabetic: Yes     Tobacco smoker: No     Systolic Blood Pressure: 175 mmHg     Is BP treated: Yes     HDL Cholesterol: 49 mg/dL     Total Cholesterol: 144 mg/dL   Patient is participating in a Managed Medicaid Plan:  Yes    A/P: Hypertension diagnosed in 2008 currently above goal on current medications. BP goal < 130/80 mmHg. Medication adherence appears appropriate. She feels anxious during her doctor visits and admits she did not sleep well last night. That + the sodium in her soup this morning could be the cause of her elevated BP today. Her BP last month was good.    I trended her BP reading in our office (both mine and Dr. Lynelle Doctor visits since  09/2021): 09/25/21: 138/83 mmHg 10/21/21: 135/71 mmHg 11/04/21: 148/82 mmHg 02/24/22: 216/104 mmHg  03/10/22: 126/77 mmHg 04/14/22: 175/89 mmHg  It looks like her BP can vary quite a bit depending on her level of anxiety in clinic. I have no home values to compare these to. I will hold off on changes since her BP last visit was okay. However, she will Dr. Delford Field in 3 weeks and we may need to consider additional changes at that time. Of note, K levels and kidney function looks good. She has intolerances to RAAS therapy but we can consider low dose valsartan or irbesartan at that visit. If she is resistant to this, I would recommend to increase spironolactone dose. Hydralazine TDD doses >150mg  increases risk for side effects, particularly drug-induced lupus. I would advise other options before this one.   06/04/22 This patient is seen in short-term follow-up and previously had been seen by clinical pharmacy as noted above in March.  The patient has struggled to take her medications as scheduled.  She misses her afternoon and middle the day dose of her hydralazine and carvedilol.  The patient's A1c is at goal 5.8 blood sugar 97 but blood pressure 184/97 when I recheck it it is no different.  Patient does have a mental health doctor is retiring she would like refills on her Remeron.  The patient would also benefit from case management on her insurance status.  Patient is actually able to afford her medications and her co-pay she is just not taking medications as prescribed.   09/14/22     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) 02/24/2022   Controlled type 2 diabetes mellitus without complication, without long-term current use of insulin (HCC) 02/24/2022   Influenza vaccine refused 12/26/2019   Adjustment disorder with anxious mood 08/18/2018   Obesity (BMI 30-39.9) 09/17/2016   Internal hemorrhoid 09/16/2016    Adjustment disorder with mixed anxiety and depressed mood 12/04/2015   Primary insomnia 12/04/2015   Seizures (HCC) 08/24/2013   Class 2 severe obesity due to excess calories with serious comorbidity and body mass index (BMI) of 38.0 to 38.9 in adult (HCC)    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   PES PLANUS, RIGHT 07/20/2007   Acne 07/09/2006   Major depressive disorder, recurrent episode, moderate (HCC) 03/18/2006   Essential hypertension 03/18/2006   PSORIASIS 03/18/2006   Past Medical History:  Diagnosis Date   Anxiety    Carpal tunnel syndrome    Diabetes mellitus without complication (HCC)    Diabetes mellitus, type II (HCC)    DYSTONIA 01/04/2007   Qualifier: Diagnosis of  By: Humberto Seals NP, Darl Pikes     Facial droop 10/09/2013   Hemorrhoid    Hypertension    Major depressive disorder, recurrent episode (HCC) 03/18/2006   Qualifier: Diagnosis of  By: Bradly Bienenstock     Obesity, unspecified 10/17/2007   Qualifier: Diagnosis of  By: Gerilyn Pilgrim PHD, Jeannie     Prediabetes 10/09/2013   Rectal bleeding 07/03/2015   Seizures (HCC)    Seizures (HCC) 08/24/2013   Past Surgical History:  Procedure Laterality Date   ABDOMINAL HYSTERECTOMY     COLONOSCOPY N/A 07/15/2015   Dr. Darrick Penna: small-mouth diverticula, small non-bleeding internal hemorrhoids, redundant colon    HERNIA REPAIR     MYOMECTOMY     Social History   Tobacco Use   Smoking status: Never   Smokeless tobacco: Never  Vaping Use   Vaping status: Never Used  Substance Use Topics   Alcohol use: No    Alcohol/week: 0.0 standard drinks of  alcohol   Drug use: No   Social History   Socioeconomic History   Marital status: Divorced    Spouse name: Not on file   Number of children: 1   Years of education: Not on file   Highest education level: Not on file  Occupational History   Not on file  Tobacco Use   Smoking status: Never   Smokeless tobacco: Never  Vaping Use   Vaping status: Never Used  Substance and Sexual Activity    Alcohol use: No    Alcohol/week: 0.0 standard drinks of alcohol   Drug use: No   Sexual activity: Not Currently  Other Topics Concern   Not on file  Social History Narrative   Right handed    Lives alone    Lives in apartment  on 6th floor    No caffeine   Social Determinants of Health   Financial Resource Strain: Low Risk  (04/14/2022)   Overall Financial Resource Strain (CARDIA)    Difficulty of Paying Living Expenses: Not hard at all  Food Insecurity: Food Insecurity Present (04/14/2022)   Hunger Vital Sign    Worried About Running Out of Food in the Last Year: Sometimes true    Ran Out of Food in the Last Year: Never true  Transportation Needs: No Transportation Needs (04/14/2022)   PRAPARE - Administrator, Civil Service (Medical): No    Lack of Transportation (Non-Medical): No  Physical Activity: Sufficiently Active (04/14/2022)   Exercise Vital Sign    Days of Exercise per Week: 5 days    Minutes of Exercise per Session: 30 min  Stress: Stress Concern Present (04/14/2022)   [employer] of Occupational Health - Occupational Stress Questionnaire    Feeling of Stress : To some extent  Social Connections: Socially Isolated (04/14/2022)   Social Connection and Isolation Panel [NHANES]    Frequency of Communication with Friends and Family: Twice a week    Frequency of Social Gatherings with Friends and Family: Twice a week    Attends Religious Services: Never    Database administrator or Organizations: No    Attends Banker Meetings: Never    Marital Status: Never married  Intimate Partner Violence: Not At Risk (04/14/2022)   Humiliation, Afraid, Rape, and Kick questionnaire    Fear of Current or Ex-Partner: No    Emotionally Abused: No    Physically Abused: No    Sexually Abused: No   Family Status  Relation Name Status   Mother  Deceased   Father  Deceased   Sister  (Not Specified)   Mat Uncle  (Not Specified)   Cousin  (Not Specified)    MGM  Deceased   MGF  Deceased   PGM  Deceased   PGF  Deceased   Neg Hx  (Not Specified)  No partnership data on file   Family History  Problem Relation Age of Onset   Asthma Mother    Hypertension Father    Heart disease Father    Stroke Father    Cancer Sister        uterus cancer    Diabetes Maternal Uncle    Breast cancer Cousin    Colon cancer Neg Hx    Allergies  Allergen Reactions   Codeine Anaphylaxis   Shellfish Allergy Swelling and Rash    Throat Sweling   Apple Cider Vinegar     syncope   Cinnamon Hives   Cozaar [Losartan]  Makes her feel like she is having a heart attack   Lisinopril     cough   Opium     Interferes with Keppra   Banana Rash    Cannot eat any while taking KEPPRA   Catfish [Fish Allergy] Rash   Orange Fruit [Citrus] Rash    Cannot have while taking KEPPRA   Tomato Itching and Rash      Review of Systems  Constitutional:  Negative for chills, diaphoresis, fever, malaise/fatigue and weight loss.  HENT:  Negative for congestion, hearing loss, nosebleeds, sore throat and tinnitus.   Eyes:  Negative for blurred vision, photophobia and redness.  Respiratory:  Negative for cough, hemoptysis, sputum production, shortness of breath, wheezing and stridor.   Cardiovascular:  Negative for chest pain, palpitations, orthopnea, claudication, leg swelling and PND.  Gastrointestinal:  Negative for abdominal pain, blood in stool, constipation, diarrhea, heartburn, nausea and vomiting.  Genitourinary:  Negative for dysuria, flank pain, frequency, hematuria and urgency.  Musculoskeletal:  Negative for back pain, falls, joint pain, myalgias and neck pain.       Neuropathic foot pain  Skin:  Negative for itching and rash.  Neurological:  Negative for dizziness, tingling, tremors, sensory change, speech change, focal weakness, seizures, loss of consciousness, weakness and headaches.  Endo/Heme/Allergies:  Negative for environmental allergies and  polydipsia. Does not bruise/bleed easily.  Psychiatric/Behavioral:  Negative for depression, memory loss, substance abuse and suicidal ideas. The patient is not nervous/anxious and does not have insomnia.       Objective:     There were no vitals taken for this visit. BP Readings from Last 3 Encounters:  08/18/22 (!) 144/72  07/14/22 132/75  07/07/22 (!) 149/61   Wt Readings from Last 3 Encounters:  07/07/22 208 lb 9.6 oz (94.6 kg)  06/04/22 205 lb 3.2 oz (93.1 kg)  02/24/22 197 lb 9.6 oz (89.6 kg)      Physical Exam Vitals reviewed.  Constitutional:      Appearance: Normal appearance. She is well-developed. She is obese. She is not diaphoretic.  HENT:     Head: Normocephalic and atraumatic.     Nose: Nose normal. No nasal deformity, septal deviation, mucosal edema or rhinorrhea.     Right Sinus: No maxillary sinus tenderness or frontal sinus tenderness.     Left Sinus: No maxillary sinus tenderness or frontal sinus tenderness.     Mouth/Throat:     Mouth: Mucous membranes are moist.     Pharynx: Oropharynx is clear. No oropharyngeal exudate.  Eyes:     General: No scleral icterus.    Conjunctiva/sclera: Conjunctivae normal.     Pupils: Pupils are equal, round, and reactive to light.  Neck:     Thyroid: No thyromegaly.     Vascular: No carotid bruit or JVD.     Trachea: Trachea normal. No tracheal tenderness or tracheal deviation.  Cardiovascular:     Rate and Rhythm: Normal rate and regular rhythm.     Chest Wall: PMI is not displaced.     Pulses: Normal pulses. No decreased pulses.     Heart sounds: Normal heart sounds, S1 normal and S2 normal. Heart sounds not distant. No murmur heard.    No systolic murmur is present.     No diastolic murmur is present.     No friction rub. No gallop. No S3 or S4 sounds.  Pulmonary:     Effort: No tachypnea, accessory muscle usage or respiratory distress.  Breath sounds: No stridor. No decreased breath sounds, wheezing,  rhonchi or rales.  Chest:     Chest wall: No tenderness.  Abdominal:     General: Bowel sounds are normal. There is no distension.     Palpations: Abdomen is soft. Abdomen is not rigid.     Tenderness: There is no abdominal tenderness. There is no guarding or rebound.  Musculoskeletal:        General: Normal range of motion.     Cervical back: Normal range of motion and neck supple. No edema, erythema or rigidity. No muscular tenderness. Normal range of motion.     Comments: Onychomycosis  Lymphadenopathy:     Head:     Right side of head: No submental or submandibular adenopathy.     Left side of head: No submental or submandibular adenopathy.     Cervical: No cervical adenopathy.  Skin:    General: Skin is warm and dry.     Coloration: Skin is not pale.     Findings: Rash present.     Nails: There is no clubbing.     Comments: Acneiform rash in the lower portion of face  Neurological:     General: No focal deficit present.     Mental Status: She is alert and oriented to person, place, and time. Mental status is at baseline.     Cranial Nerves: No cranial nerve deficit.     Sensory: No sensory deficit.     Motor: No weakness.     Coordination: Coordination normal.     Gait: Gait normal.     Deep Tendon Reflexes: Reflexes normal.  Psychiatric:        Speech: Speech normal.        Behavior: Behavior normal.      No results found for any visits on 09/15/22.   Last CBC Lab Results  Component Value Date   WBC 7.7 12/26/2019   HGB 12.5 12/26/2019   HCT 36.7 12/26/2019   MCV 80 12/26/2019   MCH 27.2 12/26/2019   RDW 14.0 12/26/2019   PLT 341 12/26/2019   Last metabolic panel Lab Results  Component Value Date   GLUCOSE 79 06/04/2022   NA 141 06/04/2022   K 4.2 06/04/2022   CL 103 06/04/2022   CO2 23 06/04/2022   BUN 11 06/04/2022   CREATININE 0.77 06/04/2022   EGFR 86 06/04/2022   CALCIUM 9.9 06/04/2022   PHOS 3.6 02/24/2022   PROT 7.2 09/25/2021   ALBUMIN  4.2 02/24/2022   LABGLOB 2.7 09/25/2021   AGRATIO 1.7 09/25/2021   BILITOT 0.2 09/25/2021   ALKPHOS 99 09/25/2021   AST 18 09/25/2021   ALT 14 09/25/2021   ANIONGAP 13 10/05/2013   Last lipids Lab Results  Component Value Date   CHOL 144 01/29/2021   HDL 49 01/29/2021   LDLCALC 68 01/29/2021   TRIG 155 (H) 01/29/2021   CHOLHDL 2.9 01/29/2021   Last hemoglobin A1c Lab Results  Component Value Date   HGBA1C 5.8 06/04/2022   Last thyroid functions Lab Results  Component Value Date   TSH 1.660 12/26/2019   Last vitamin D Lab Results  Component Value Date   VD25OH 30.7 02/24/2022      The 10-year ASCVD risk score (Arnett DK, et al., 2019) is: 20.7%    Assessment & Plan:   Problem List Items Addressed This Visit   None  No follow-ups on file.  35 minutes spent extra time needed for multisystem  assessment and high disease risk  Shan Levans, MD

## 2022-09-13 NOTE — Progress Notes (Signed)
Virtual Visit via Telephone Note  I connected with [name] on 09/01/2022 at 11:00-11:45 am by phone enabled telehealth application and verified that I am speaking withthe correct person using two identifiers. Patient does not have a computer or Internet.   I discussed the limitations of evaluation and management by telemedicine and the availability of in person appointments. The patient expressed understanding and agreed to proceed.  LOCATION:  Patient: Home Provider: Home Office  Treatment Goal Addressed: Develop the ability to recognize, accept, and cope with feelings of depression AEB self report.  Progress Towards Goal: Progressing   History of Present Illness: Pt was referred for OP therapy by her neurologist Dr.  Lions for depression.   Observations/Objective: Patient presented for today's session on time and was alert, oriented x5, with no evidence or self-report of SI/HI or A/V H. Patient reported ongoing compliance with medication and denied any use of alcohol or illicit substances. Clinician inquired about patient's current emotional ratings, as well as any significant changes in thoughts, feelings or behaviors. Pt reports on her family which causes her stress. Pt reports 4/10 for depression and 4/10 for anxiety and 1/10 for irritability. " I continue walking again early in the morning before it gets too hot. I have now joined [employer] and walk on the treadmill for 1 hour daily. "Cln asked open ended questions.  Cln congratulated pt on her goal to lose weight and keep her bp wnl. "This wellness program also keeps my diabetes in check and keeps me less depressed. " Cln and pt again reviewed her progress in her wellness plan: exercise, getting enough sleep, eating more healthy, keeping toxic people out of her life. Cln and pt reviewed nutrition, eating healthier, no salt." Cln reviewed her wellness plan and Again, explained the cycle of depression and how to interrupt the cycle  with coping skills.    Collaboration of Care: Other: Continue to see her providers  Patient/Guardian was advised Release of Information must be obtained prior to any record release in order to collaborate their care with an outside provider.    Patient/guardian was advised if not not already done so to contact the registration department to sign all necessary forms in order for [place] to release information regarding their care.   Consent: Patient/Guardian gives verbal consent for treatment and assignment of benefits for services provided during this visit. Patient/Guardian expressed understanding and agreed to proceed.      Assessment and plan: Counselor will continue to meet with patient to address treatment plan goals. Patient will continue to follow recommendations of providers and implement skills learned in session and practice skills between sessions. Diagnosis: Depression  Follow Up Instructions:   I discussed the assessment and treatment plan with the patient. The patient was provided an opportunity to ask questions and all were answered. The patient agreed with the plan and demonstrated an understanding of the instructions.   The patient was advised to call back or seek an in-person evaluation if the symptoms worsen or if the condition fails to improve as anticipated.  I provided 45 minutes of non-face-to-face time during this encounter.   Marty Uy S, LCAS

## 2022-09-15 ENCOUNTER — Ambulatory Visit (INDEPENDENT_AMBULATORY_CARE_PROVIDER_SITE_OTHER): Payer: 59 | Admitting: Licensed Clinical Social Worker

## 2022-09-15 ENCOUNTER — Ambulatory Visit: Payer: 59 | Attending: Critical Care Medicine | Admitting: Critical Care Medicine

## 2022-09-15 ENCOUNTER — Other Ambulatory Visit: Payer: Self-pay

## 2022-09-15 ENCOUNTER — Encounter: Payer: Self-pay | Admitting: Critical Care Medicine

## 2022-09-15 VITALS — BP 109/72 | HR 63 | Wt 195.0 lb

## 2022-09-15 DIAGNOSIS — E7800 Pure hypercholesterolemia, unspecified: Secondary | ICD-10-CM | POA: Insufficient documentation

## 2022-09-15 DIAGNOSIS — F331 Major depressive disorder, recurrent, moderate: Secondary | ICD-10-CM

## 2022-09-15 DIAGNOSIS — Z6835 Body mass index (BMI) 35.0-35.9, adult: Secondary | ICD-10-CM | POA: Diagnosis not present

## 2022-09-15 DIAGNOSIS — E119 Type 2 diabetes mellitus without complications: Secondary | ICD-10-CM

## 2022-09-15 DIAGNOSIS — G4700 Insomnia, unspecified: Secondary | ICD-10-CM | POA: Diagnosis not present

## 2022-09-15 DIAGNOSIS — F5101 Primary insomnia: Secondary | ICD-10-CM

## 2022-09-15 DIAGNOSIS — R569 Unspecified convulsions: Secondary | ICD-10-CM | POA: Insufficient documentation

## 2022-09-15 DIAGNOSIS — R21 Rash and other nonspecific skin eruption: Secondary | ICD-10-CM | POA: Diagnosis not present

## 2022-09-15 DIAGNOSIS — Z7984 Long term (current) use of oral hypoglycemic drugs: Secondary | ICD-10-CM | POA: Diagnosis not present

## 2022-09-15 DIAGNOSIS — R7303 Prediabetes: Secondary | ICD-10-CM

## 2022-09-15 DIAGNOSIS — I10 Essential (primary) hypertension: Secondary | ICD-10-CM

## 2022-09-15 DIAGNOSIS — Z79899 Other long term (current) drug therapy: Secondary | ICD-10-CM | POA: Diagnosis not present

## 2022-09-15 MED ORDER — HYDRALAZINE HCL 50 MG PO TABS
50.0000 mg | ORAL_TABLET | Freq: Three times a day (TID) | ORAL | 1 refills | Status: DC
  Filled 2022-09-15: qty 270, 90d supply, fill #0
  Filled 2022-10-21: qty 90, 30d supply, fill #0
  Filled 2022-11-23: qty 90, 30d supply, fill #1
  Filled 2023-02-19 (×2): qty 90, 30d supply, fill #2
  Filled 2023-05-19: qty 90, 30d supply, fill #3
  Filled 2023-07-26: qty 90, 30d supply, fill #4

## 2022-09-15 MED ORDER — VITAMIN D (ERGOCALCIFEROL) 1.25 MG (50000 UNIT) PO CAPS
50000.0000 [IU] | ORAL_CAPSULE | ORAL | 3 refills | Status: AC
  Filled 2022-09-15: qty 12, 84d supply, fill #0
  Filled 2023-02-19: qty 4, 28d supply, fill #0
  Filled 2023-02-19: qty 12, 84d supply, fill #0
  Filled 2023-05-19: qty 4, 28d supply, fill #1
  Filled 2023-07-26: qty 4, 28d supply, fill #2
  Filled 2023-09-14: qty 4, 28d supply, fill #3

## 2022-09-15 MED ORDER — AMLODIPINE BESYLATE 10 MG PO TABS
10.0000 mg | ORAL_TABLET | Freq: Every day | ORAL | 3 refills | Status: DC
Start: 2022-09-15 — End: 2023-08-03
  Filled 2022-09-15 – 2023-02-19 (×2): qty 90, 90d supply, fill #0
  Filled 2023-05-19: qty 30, 30d supply, fill #1
  Filled 2023-07-26: qty 30, 30d supply, fill #2

## 2022-09-15 MED ORDER — CARVEDILOL 25 MG PO TABS
25.0000 mg | ORAL_TABLET | Freq: Two times a day (BID) | ORAL | 1 refills | Status: DC
  Filled 2022-09-15 – 2022-10-21 (×2): qty 180, 90d supply, fill #0
  Filled 2023-02-19: qty 180, 90d supply, fill #1

## 2022-09-15 MED ORDER — LEVETIRACETAM ER 500 MG PO TB24
500.0000 mg | ORAL_TABLET | Freq: Three times a day (TID) | ORAL | 3 refills | Status: DC
  Filled 2022-09-15: qty 270, 90d supply, fill #0
  Filled 2022-11-23: qty 270, 90d supply, fill #1
  Filled 2023-02-19: qty 270, 90d supply, fill #2

## 2022-09-15 MED ORDER — MIRTAZAPINE 30 MG PO TABS
30.0000 mg | ORAL_TABLET | Freq: Every day | ORAL | 1 refills | Status: DC
  Filled 2022-09-15: qty 90, 90d supply, fill #0

## 2022-09-15 MED ORDER — SPIRONOLACTONE 50 MG PO TABS
50.0000 mg | ORAL_TABLET | Freq: Every day | ORAL | 2 refills | Status: DC
  Filled 2022-09-15 – 2022-10-12 (×2): qty 90, 90d supply, fill #0
  Filled 2023-02-19: qty 90, 90d supply, fill #1
  Filled 2023-05-19: qty 30, 30d supply, fill #2
  Filled 2023-07-26: qty 30, 30d supply, fill #3

## 2022-09-15 MED ORDER — METFORMIN HCL 500 MG PO TABS
500.0000 mg | ORAL_TABLET | Freq: Every day | ORAL | 2 refills | Status: DC
Start: 2022-09-15 — End: 2023-06-17
  Filled 2022-09-15 – 2022-11-23 (×2): qty 90, 90d supply, fill #0
  Filled 2023-02-19: qty 90, 90d supply, fill #1
  Filled 2023-05-19: qty 30, 30d supply, fill #2

## 2022-09-15 NOTE — Assessment & Plan Note (Addendum)
Controlled no change in medication and check labs

## 2022-09-15 NOTE — Assessment & Plan Note (Signed)
Continue metformin.

## 2022-09-15 NOTE — Patient Instructions (Signed)
Medications refilled Labs today Consider pneumonia vaccine, you declined flu vaccine  Return 4 months

## 2022-09-16 ENCOUNTER — Telehealth: Payer: Self-pay

## 2022-09-16 ENCOUNTER — Other Ambulatory Visit: Payer: Self-pay

## 2022-09-16 DIAGNOSIS — H9192 Unspecified hearing loss, left ear: Secondary | ICD-10-CM

## 2022-09-16 LAB — CBC WITH DIFFERENTIAL/PLATELET
Basophils Absolute: 0 10*3/uL (ref 0.0–0.2)
Basos: 1 %
EOS (ABSOLUTE): 0.1 10*3/uL (ref 0.0–0.4)
Eos: 1 %
Hematocrit: 40.1 % (ref 34.0–46.6)
Hemoglobin: 12.9 g/dL (ref 11.1–15.9)
Immature Grans (Abs): 0 10*3/uL (ref 0.0–0.1)
Immature Granulocytes: 0 %
Lymphocytes Absolute: 2.1 10*3/uL (ref 0.7–3.1)
Lymphs: 29 %
MCH: 27.2 pg (ref 26.6–33.0)
MCHC: 32.2 g/dL (ref 31.5–35.7)
MCV: 84 fL (ref 79–97)
Monocytes Absolute: 0.6 10*3/uL (ref 0.1–0.9)
Monocytes: 9 %
Neutrophils Absolute: 4.5 10*3/uL (ref 1.4–7.0)
Neutrophils: 60 %
Platelets: 314 10*3/uL (ref 150–450)
RBC: 4.75 x10E6/uL (ref 3.77–5.28)
RDW: 14.7 % (ref 11.7–15.4)
WBC: 7.4 10*3/uL (ref 3.4–10.8)

## 2022-09-16 LAB — BMP8+EGFR
BUN/Creatinine Ratio: 11 — ABNORMAL LOW (ref 12–28)
BUN: 11 mg/dL (ref 8–27)
CO2: 21 mmol/L (ref 20–29)
Calcium: 9.5 mg/dL (ref 8.7–10.3)
Chloride: 103 mmol/L (ref 96–106)
Creatinine, Ser: 1.03 mg/dL — ABNORMAL HIGH (ref 0.57–1.00)
Glucose: 82 mg/dL (ref 70–99)
Potassium: 5 mmol/L (ref 3.5–5.2)
Sodium: 138 mmol/L (ref 134–144)
eGFR: 60 mL/min/{1.73_m2} (ref >59)

## 2022-09-16 LAB — LIPID PANEL
Chol/HDL Ratio: 2.5 ratio (ref 0.0–4.4)
Cholesterol, Total: 103 mg/dL (ref 100–199)
HDL: 42 mg/dL (ref >39)
LDL Chol Calc (NIH): 40 mg/dL (ref 0–99)
Triglycerides: 115 mg/dL (ref 0–149)
VLDL Cholesterol Cal: 21 mg/dL (ref 5–40)

## 2022-09-16 NOTE — Telephone Encounter (Signed)
Pt was called and vm was left, Information has been sent to nurse pool.   

## 2022-09-16 NOTE — Progress Notes (Signed)
Let pt know kidney normal, blood count normal, cholesterol normal

## 2022-09-16 NOTE — Telephone Encounter (Signed)
Called patient and she is aware of referral  

## 2022-09-16 NOTE — Addendum Note (Signed)
Addended by: [name] on: 09/16/2022 02:17 PM   Modules accepted: Orders

## 2022-09-16 NOTE — Telephone Encounter (Signed)
Referral to audiology made

## 2022-09-16 NOTE — Telephone Encounter (Signed)
-----   Message from Shan Levans sent at 09/16/2022  6:18 AM EDT ----- Let pt know kidney normal, blood count normal, cholesterol normal

## 2022-09-17 ENCOUNTER — Ambulatory Visit (INDEPENDENT_AMBULATORY_CARE_PROVIDER_SITE_OTHER): Payer: 59 | Admitting: Podiatry

## 2022-09-17 DIAGNOSIS — E119 Type 2 diabetes mellitus without complications: Secondary | ICD-10-CM | POA: Diagnosis not present

## 2022-09-17 DIAGNOSIS — M79675 Pain in left toe(s): Secondary | ICD-10-CM | POA: Diagnosis not present

## 2022-09-17 DIAGNOSIS — M79674 Pain in right toe(s): Secondary | ICD-10-CM

## 2022-09-17 DIAGNOSIS — B351 Tinea unguium: Secondary | ICD-10-CM

## 2022-09-17 NOTE — Progress Notes (Signed)
Subjective: No chief complaint on file.   67 year old female presents the office with concerns of thick, discolored toenails that she cannot trim herself and they are causing discomfort as well as for calluses.  She has no other concerns today.  No open lesions she reports or injuries.   [name], MD Last seen 09/15/2022  A1c 6.0 on 26/2024  Objective: AAO x3, NAD DP/PT pulses palpable bilaterally, CRT less than 3 seconds Nails are hypertrophic, dystrophic, brittle, discolored, elongated 10. No surrounding redness or drainage. Tenderness nails 1-5 bilaterally. There is a annular hyper pigmented lesion present on the right third toe consistent with a mole.  Still unchanged. She also has on the left foot that she states "has been there my whole life".  Appear to be a form color and borders. No pain with calf compression, swelling, warmth, erythema  Assessment: Type 2 diabetes with neuropathy, symptomatic onychomycosis  Plan: -All treatment options discussed with the patient including all alternatives, risks, complications.  -Sharply debrided nails x10 without any complications or bleeding -Discussed moisturizer to the feet daily.  -Discussed daily foot inspection -Patient encouraged to call the office with any questions, concerns, change in symptoms.   Vivi Barrack DPM

## 2022-09-21 ENCOUNTER — Encounter (HOSPITAL_COMMUNITY): Payer: Self-pay | Admitting: Licensed Clinical Social Worker

## 2022-09-21 NOTE — Progress Notes (Signed)
Virtual Visit via Telephone Note  I connected with [name] on 09/08/2022 at 2:00-3:00pm by phone enabled telehealth application and verified that I am speaking withthe correct person using two identifiers. Patient does not have a computer or Internet.   I discussed the limitations of evaluation and management by telemedicine and the availability of in person appointments. The patient expressed understanding and agreed to proceed.  LOCATION:  Patient: Home Provider: Home Office  Treatment Goal Addressed: Develop the ability to recognize, accept, and cope with feelings of depression AEB self report.  Progress Towards Goal: Progressing   History of Present Illness: Pt was referred for OP therapy by her neurologist Dr. [place] Lions for depression.   Observations/Objective: Patient presented for today's session on time and was alert, oriented x5, with no evidence or self-report of SI/HI or A/V H. Patient reported ongoing compliance with medication and denied any use of alcohol or illicit substances. Clinician inquired about patient's current emotional ratings, as well as any significant changes in thoughts, feelings or behaviors. Pt reports on her family which causes her stress. Pt reports 4/10 for depression and 4/10 for anxiety and 1/10 for irritability. " I continue walking again early in the morning before it gets too hot. I have now joined [employer] and walk on the treadmill for 1 hour daily. "Cln asked open ended questions.  Cln congratulated pt on her goal to lose weight and keep her bp wnl." I also feel less depressed. " Cln and pt again reviewed her progress in her wellness plan: exercise, getting enough sleep, eating more healthy, keeping toxic people out of her life. Cln and pt reviewed nutrition, eating healthier, no salt. Clinician utilized MI OARS to reflect and summarize thoughts and feelings about this new normal life she is experiencing. Clinician discussed the importance of  focusing on the here and now, what he can control, eating healthier, keeping toxic people out of her life, walking regmine, walking on treadmill, keeping her depressive symptoms minimal.  Collaboration of Care: Other: Continue to see her providers  Patient/Guardian was advised Release of Information must be obtained prior to any record release in order to collaborate their care with an outside provider.    Patient/guardian was advised if not not already done so to contact the registration department to sign all necessary forms in order for [place] to release information regarding their care.   Consent: Patient/Guardian gives verbal consent for treatment and assignment of benefits for services provided during this visit. Patient/Guardian expressed understanding and agreed to proceed.      Assessment and plan: Counselor will continue to meet with patient to address treatment plan goals. Patient will continue to follow recommendations of providers and implement skills learned in session and practice skills between sessions. Diagnosis: Depression  Follow Up Instructions:   I discussed the assessment and treatment plan with the patient. The patient was provided an opportunity to ask questions and all were answered. The patient agreed with the plan and demonstrated an understanding of the instructions.   The patient was advised to call back or seek an in-person evaluation if the symptoms worsen or if the condition fails to improve as anticipated.  I provided 60 minutes of non-face-to-face time during this encounter.   Alesa Echevarria S, LCAS

## 2022-09-21 NOTE — Progress Notes (Signed)
Virtual Visit via Telephone Note  I connected with [name] on 09/15/2022 at 2:00-3:00pm by phone enabled telehealth application and verified that I am speaking withthe correct person using two identifiers. Patient does not have a computer or Internet.   I discussed the limitations of evaluation and management by telemedicine and the availability of in person appointments. The patient expressed understanding and agreed to proceed.  LOCATION:  Patient: Home Provider: Home Office  Treatment Goal Addressed: Develop the ability to recognize, accept, and cope with feelings of depression AEB self report.  Progress Towards Goal: Progressing   History of Present Illness: Pt was referred for OP therapy by her neurologist Dr. [place] Lions for depression.   Observations/Objective: Patient presented for today's session on time and was alert, oriented x5, with no evidence or self-report of SI/HI or A/V H. Patient reported ongoing compliance with medication and denied any use of alcohol or illicit substances. Clinician inquired about patient's current emotional ratings, as well as any significant changes in thoughts, feelings or behaviors. Pt reports on her family which causes her stress. Pt reports 4/10 for depression and 4/10 for anxiety and 1/10 for irritability. " Pt reports on her exercise regime which she continues. "I went to my dr this week and he said my bp is lower and I've lost 10 lbs." Cln congratulated pt on her new regime and the results, and noticed her mood seems improved. Cln and pt again reviewed her progress in her wellness plan: exercise, getting enough sleep, eating more healthy, keeping toxic people out of her life. Cln and pt reviewed nutrition, eating healthier, no salt.  "I even shop differently now, buying more healthy foods."Cln used CBT to assist in identifying positives to assess how her mood has been impacted. Clinician allowed space for patient to further process emotions and  clinician provided supportive statements throughout session. It was suggested to patient to continue to identify positives that  impact mood daily. Cln provided education on how to to recognize the physical, cognitive, emotional, and behavioral responses that influence her mood. Pt was receptive to the intervention.    Collaboration of Care: Other: Continue to see her providers  Patient/Guardian was advised Release of Information must be obtained prior to any record release in order to collaborate their care with an outside provider.    Patient/guardian was advised if not not already done so to contact the registration department to sign all necessary forms in order for [place] to release information regarding their care.   Consent: Patient/Guardian gives verbal consent for treatment and assignment of benefits for services provided during this visit. Patient/Guardian expressed understanding and agreed to proceed.      Assessment and plan: Counselor will continue to meet with patient to address treatment plan goals. Patient will continue to follow recommendations of providers and implement skills learned in session and practice skills between sessions. Diagnosis: Depression  Follow Up Instructions:   I discussed the assessment and treatment plan with the patient. The patient was provided an opportunity to ask questions and all were answered. The patient agreed with the plan and demonstrated an understanding of the instructions.   The patient was advised to call back or seek an in-person evaluation if the symptoms worsen or if the condition fails to improve as anticipated.  I provided 60 minutes of non-face-to-face time during this encounter.   [name] S, LCAS

## 2022-09-22 ENCOUNTER — Encounter (HOSPITAL_COMMUNITY): Payer: Self-pay | Admitting: Licensed Clinical Social Worker

## 2022-09-22 ENCOUNTER — Ambulatory Visit (INDEPENDENT_AMBULATORY_CARE_PROVIDER_SITE_OTHER): Payer: 59 | Admitting: Licensed Clinical Social Worker

## 2022-09-22 DIAGNOSIS — F331 Major depressive disorder, recurrent, moderate: Secondary | ICD-10-CM

## 2022-09-22 NOTE — Progress Notes (Signed)
Virtual Visit via Telephone Note  I connected with [name] on 09/22/2022 at 11-11:45am by phone enabled telehealth application and verified that I am speaking withthe correct person using two identifiers. Patient does not have a computer or Internet.   I discussed the limitations of evaluation and management by telemedicine and the availability of in person appointments. The patient expressed understanding and agreed to proceed.  LOCATION:  Patient: Home Provider: Home Office  Treatment Goal Addressed: Develop the ability to recognize, accept, and cope with feelings of depression AEB self report.  Progress Towards Goal: Progressing   History of Present Illness: Pt was referred for OP therapy by her neurologist Dr.  Lions for depression.   Observations/Objective: Patient presented for today's session on time and was alert, oriented x5, with no evidence or self-report of SI/HI or A/V H. Patient reported ongoing compliance with medication and denied any use of alcohol or illicit substances. Clinician inquired about patient's current emotional ratings, as well as any significant changes in thoughts, feelings or behaviors. Pt reports on her family which causes her stress. Pt reports 4/10 for depression and 4/10 for anxiety and 1/10 for irritability.  Cln  and pt reviewed her emotional ratings where pt tried to pinpoint her current stressors" Pt reports on her exercise regime which she continues., "my moods seem to have improved."Cln and pt again reviewed her progress in her wellness plan: exercise, getting enough sleep, eating more healthy, keeping toxic people out of her life. Cln and pt reviewed nutrition, eating healthier.  Cln and pt discussed the impact these stressors have had on pt as well as how she copes with them. Cln validated pt feelings, noting the cumulative effect of multiple concurrent stressors. We worked to identify strategies pt might use to reduce stress. Also encouraged pt to  make sure she is taking time for self-care and seeking support when needed. Pt plans to do so.    Collaboration of Care: Other: Continue to see her providers  Patient/Guardian was advised Release of Information must be obtained prior to any record release in order to collaborate their care with an outside provider.    Patient/guardian was advised if not not already done so to contact the registration department to sign all necessary forms in order for [place] to release information regarding their care.   Consent: Patient/Guardian gives verbal consent for treatment and assignment of benefits for services provided during this visit. Patient/Guardian expressed understanding and agreed to proceed.      Assessment and plan: Counselor will continue to meet with patient to address treatment plan goals. Patient will continue to follow recommendations of providers and implement skills learned in session and practice skills between sessions. Diagnosis: Depression  Follow Up Instructions:   I discussed the assessment and treatment plan with the patient. The patient was provided an opportunity to ask questions and all were answered. The patient agreed with the plan and demonstrated an understanding of the instructions.   The patient was advised to call back or seek an in-person evaluation if the symptoms worsen or if the condition fails to improve as anticipated.  I provided 45 minutes of non-face-to-face time during this encounter.   Keldrick Pomplun S, LCAS

## 2022-09-29 ENCOUNTER — Ambulatory Visit (INDEPENDENT_AMBULATORY_CARE_PROVIDER_SITE_OTHER): Payer: 59 | Admitting: Licensed Clinical Social Worker

## 2022-09-29 DIAGNOSIS — F331 Major depressive disorder, recurrent, moderate: Secondary | ICD-10-CM

## 2022-10-04 ENCOUNTER — Encounter (HOSPITAL_COMMUNITY): Payer: Self-pay | Admitting: Licensed Clinical Social Worker

## 2022-10-04 NOTE — Progress Notes (Signed)
Virtual Visit via Telephone Note  I connected with [name] on 09/29/2022 at 11-11:45am by phone enabled telehealth application and verified that I am speaking withthe correct person using two identifiers. Patient does not have a computer or Internet.   I discussed the limitations of evaluation and management by telemedicine and the availability of in person appointments. The patient expressed understanding and agreed to proceed.  LOCATION:  Patient: Home Provider: Home Office  Treatment Goal Addressed: Develop the ability to recognize, accept, and cope with feelings of depression AEB self report.  Progress Towards Goal: Progressing   History of Present Illness: Pt was referred for OP therapy by her neurologist Dr. [place] Lions for depression.   Observations/Objective: Patient presented for today's session on time and was alert, oriented x5, with no evidence or self-report of SI/HI or A/V H. Patient reported ongoing compliance with medication and denied any use of alcohol or illicit substances. Clinician inquired about patient's current emotional ratings, as well as any significant changes in thoughts, feelings or behaviors. Pt reports on her family which causes her stress. Pt reports 4/10 for depression and 4/10 for anxiety and 1/10 for irritability.  Cln  and pt reviewed her emotional ratings where pt tried to pinpoint her current stressors" Pt reports on her exercise regime which she continues., "my moods seem to have improved."Cln and pt again reviewed her progress in her wellness plan: exercise, getting enough sleep, eating more healthy, keeping toxic people out of her life. Cln and pt reviewed nutrition, eating healthier.  Cln and pt discussed the impact these stressors have had on pt as well as how she copes with them.  Session focused on pt's continued stressors pt is facing as she navigates her life. Cln and pt discussed the impact these stressors have had on pt as well as how she copes  with them. Cln validated pt feelings, noting the cumulative effect of multiple concurrent stressors. Cln and pt worked to identify strategies pt might use to reduce stress. Also encouraged pt to make sure she is taking time for self-care and seeking support when needed      Collaboration of Care: Other: Continue to see her providers  Patient/Guardian was advised Release of Information must be obtained prior to any record release in order to collaborate their care with an outside provider.    Patient/guardian was advised if not not already done so to contact the registration department to sign all necessary forms in order for [place] to release information regarding their care.   Consent: Patient/Guardian gives verbal consent for treatment and assignment of benefits for services provided during this visit. Patient/Guardian expressed understanding and agreed to proceed.      Assessment and plan: Counselor will continue to meet with patient to address treatment plan goals. Patient will continue to follow recommendations of providers and implement skills learned in session and practice skills between sessions. Diagnosis: Depression  Follow Up Instructions:   I discussed the assessment and treatment plan with the patient. The patient was provided an opportunity to ask questions and all were answered. The patient agreed with the plan and demonstrated an understanding of the instructions.   The patient was advised to call back or seek an in-person evaluation if the symptoms worsen or if the condition fails to improve as anticipated.  I provided 45 minutes of non-face-to-face time during this encounter.   Lemon Sternberg S, LCAS

## 2022-10-06 ENCOUNTER — Ambulatory Visit: Payer: 59 | Attending: Critical Care Medicine | Admitting: Pharmacist

## 2022-10-06 ENCOUNTER — Ambulatory Visit (INDEPENDENT_AMBULATORY_CARE_PROVIDER_SITE_OTHER): Payer: 59 | Admitting: Licensed Clinical Social Worker

## 2022-10-06 ENCOUNTER — Encounter (HOSPITAL_COMMUNITY): Payer: Self-pay | Admitting: Licensed Clinical Social Worker

## 2022-10-06 ENCOUNTER — Encounter: Payer: Self-pay | Admitting: Pharmacist

## 2022-10-06 VITALS — BP 172/78 | HR 54

## 2022-10-06 DIAGNOSIS — I10 Essential (primary) hypertension: Secondary | ICD-10-CM | POA: Diagnosis not present

## 2022-10-06 DIAGNOSIS — F331 Major depressive disorder, recurrent, moderate: Secondary | ICD-10-CM | POA: Diagnosis not present

## 2022-10-06 NOTE — Progress Notes (Signed)
`  Virtual Visit via Telephone Note  I connected with [name] on 10/06/2022 at 2:00-2::45am by phone enabled telehealth application and verified that I am speaking withthe correct person using two identifiers. Patient does not have a computer or Internet.   I discussed the limitations of evaluation and management by telemedicine and the availability of in person appointments. The patient expressed understanding and agreed to proceed.  LOCATION:  Patient: Bus Provider: Home Office  Treatment Goal Addressed: Develop the ability to recognize, accept, and cope with feelings of depression AEB self report.  Progress Towards Goal: Progressing   History of Present Illness: Pt was referred for OP therapy by her neurologist Dr. [place] Lions for depression.   Observations/Objective: Patient presented for today's session on time and was alert, oriented x5, with no evidence or self-report of SI/HI or A/V H. Patient reported ongoing compliance with medication and denied any use of alcohol or illicit substances. Clinician inquired about patient's current emotional ratings, as well as any significant changes in thoughts, feelings or behaviors. Pt reports on her family which causes her stress. Pt reports 4/10 for depression and 4/10 for anxiety and 1/10 for irritability.  Cln  and pt reviewed her emotional ratings where pt tried to pinpoint her current stressors." Pt reports on her exercise regime which she continues., "my moods seem to have improved."Cln and pt again reviewed her progress in her wellness plan: exercise, getting enough sleep, eating more healthy, keeping toxic people out of her life. "Pt reports I'm moving to [place] to be around my daughter and grandchildren." Cln asked open ended questions and voiced my concerns. "My sister from [place] and her husband came up and packed up my apt. I went and got a housing voucher, transferring food stamps, medicaid and medicare. I'm going to [place] first to visit  with my sister and then on to [place]. " Cln canceled all of her appointments after appt next week. Cln has worked with pt for the past 6 years with much progress.       Collaboration of Care: Other: Continue to see her providers  Patient/Guardian was advised Release of Information must be obtained prior to any record release in order to collaborate their care with an outside provider.    Patient/guardian was advised if not not already done so to contact the registration department to sign all necessary forms in order for [place] to release information regarding their care.   Consent: Patient/Guardian gives verbal consent for treatment and assignment of benefits for services provided during this visit. Patient/Guardian expressed understanding and agreed to proceed.      Assessment and plan: Counselor will continue to meet with patient to address treatment plan goals. Patient will continue to follow recommendations of providers and implement skills learned in session and practice skills between sessions. Diagnosis: Depression  Follow Up Instructions:   I discussed the assessment and treatment plan with the patient. The patient was provided an opportunity to ask questions and all were answered. The patient agreed with the plan and demonstrated an understanding of the instructions.   The patient was advised to call back or seek an in-person evaluation if the symptoms worsen or if the condition fails to improve as anticipated.  I provided 45 minutes of non-face-to-face time during this encounter.   Koby Hartfield S, LCAS

## 2022-10-06 NOTE — Progress Notes (Signed)
S:  67 y.o. female who presents for hypertension evaluation, education, and management. PMH is significant for HTN, T2DM, hx seizure, MDD. Patient was referred and last seen by PCP on 09/15/2022. Pharmacy last saw her on 08/18/2022.   At last visit with PCP, blood pressure was 109/72 mmHg. BP regimen was continued with no adjustments.   Today, patient arrives in good spirits and presents without assistance. Denies dizziness, headache, blurred vision, swelling. Reports not sleeping well last night. Of note, patient is moving to [facility] to be with her daughter the first week of October and is nervous today.  Patient's chart reports hypertension was diagnosed in 2008.   Family/Social history:  - Fhx: Asthma, HTN, stroke, cancer - Tobacco: never smoker  Medication adherence good. Reports no missed doses. Patient has taken BP medications today around 5 am.   Current antihypertensives include: amlodipine 10mg  once daily at bedtime, carvedilol 12.5mg  BID, hydralazine 50mg  TID, spironolactone 50 mg once daily  Antihypertensives tried in the past include: lisinopril 40 mg daily (cough), losartan 50 mg daily (makes her feel like she's having a heart attack), hydrochlorothiazide   Reported home BP readings: does not check at home  Patient reported dietary habits: Eats 2-3 meals/day - Trying to get rid of the "white stuff" (salt, flour, salt) - Now using Mrs. Dash salt substitute   Patient-reported exercise habits: - Recently got a gym membership - walks on treadmill    ASCVD risk factors include: HTN, T2DM, family history   O:  Vitals:   10/06/22 1148 10/06/22 1149  BP: (!) 163/68 (!) 172/78  Pulse: (!) 57 (!) 54    Last 3 Office BP readings: BP Readings from Last 3 Encounters:  10/06/22 (!) 172/78  09/15/22 109/72  08/18/22 (!) 144/72    BMET    Component Value Date/Time   NA 138 09/15/2022 0928   K 5.0 09/15/2022 0928   CL 103 09/15/2022 0928   CO2 21 09/15/2022 0928    GLUCOSE 82 09/15/2022 0928   GLUCOSE 86 02/18/2016 0948   BUN 11 09/15/2022 0928   CREATININE 1.03 (H) 09/15/2022 0928   CREATININE 0.89 02/18/2016 0948   CALCIUM 9.5 09/15/2022 0928   GFRNONAA 60 12/26/2019 0949   GFRNONAA 73 08/17/2014 1013   GFRAA 69 12/26/2019 0949   GFRAA 84 08/17/2014 1013    Renal function: CrCl cannot be calculated (Patient's most recent lab result is older than the maximum 21 days allowed.).  Clinical ASCVD: No  The ASCVD Risk score (Arnett DK, et al., 2019) failed to calculate for the following reasons:   The valid total cholesterol range is 130 to 320 mg/dL  Patient is participating in a Managed Medicaid Plan: No    A/P: Hypertension diagnosed, currently uncontrolled on current medications, however, this is most likely due to trouble sleeping last night and typical nervousness at doctor's office. BP goal < 130/80 mmHg. Medication adherence appears good.  -Continued amlodipine 10mg  once daily at bedtime, carvedilol 12.5mg  BID, hydralazine 50mg  TID, spironolactone 50 mg once daily. -Pt with increased anxiety that is contributing to elevated BP readings. Also, she will be moving in a few weeks and is unable to follow up with clinic. -Could consider adding hydrochlorothiazide or chlorthalidone for additional control if needed  -Patient educated on purpose, proper use, and potential adverse effects of medications.  -Counseled on lifestyle modifications for blood pressure control including reduced dietary sodium, increased exercise, adequate sleep. -Encouraged patient to check BP at home and bring log  of readings to next visit. Counseled on proper use of home BP cuff.   Results reviewed and written information provided.    Written patient instructions provided. Patient verbalized understanding of treatment plan.  Total time in face to face counseling 20 minutes.    Follow-up:  No appointment scheduled since patient is moving, she plans to find medical care  in [facility], PharmD PGY1 Pharmacy Resident 10/06/2022 4:30 PM

## 2022-10-07 ENCOUNTER — Other Ambulatory Visit: Payer: Self-pay | Admitting: Pharmacist

## 2022-10-07 ENCOUNTER — Telehealth: Payer: Self-pay | Admitting: Pharmacist

## 2022-10-07 NOTE — Progress Notes (Signed)
Pharmacy Quality Measure Review  This patient is appearing on a report for being at risk of failing the Controlling Blood Pressure measure this calendar year.   Last documented BP 126/74 today. She meets our metric for BP control.  Butch Penny, PharmD, Patsy [place], CPP Clinical Pharmacist [facility] [phone]

## 2022-10-07 NOTE — Telephone Encounter (Signed)
Copied from CRM [id number]. Topic: General - Inquiry >> Oct 07, 2022  1:52 PM De Blanch wrote: Reason for CRM: Pt stated she wanted to let [facility] and Dr. Delford Field know that her BP is down today to 126/74, heart rate 67, weight 196, and BMI is 35. Alisha nurse from her insurance, [employer], went out and checked on her.  Please advise.

## 2022-10-07 NOTE — Telephone Encounter (Signed)
Documented, thank you!

## 2022-10-12 ENCOUNTER — Other Ambulatory Visit: Payer: Self-pay

## 2022-10-13 ENCOUNTER — Other Ambulatory Visit: Payer: 59

## 2022-10-13 ENCOUNTER — Ambulatory Visit (INDEPENDENT_AMBULATORY_CARE_PROVIDER_SITE_OTHER): Payer: 59 | Admitting: Licensed Clinical Social Worker

## 2022-10-13 DIAGNOSIS — F331 Major depressive disorder, recurrent, moderate: Secondary | ICD-10-CM | POA: Diagnosis not present

## 2022-10-13 NOTE — Patient Outreach (Signed)
Medicaid Managed Care Social Work Note  10/13/2022 Name:  [name] MRN:  [id number] DOB:  1955/11/09  [name] is an 67 y.o. year old female who is a primary patient of [name], MD.  The Medicaid Managed Care Coordination team was consulted for assistance with:  Community Resources   Ms. Defrancisco was given information about Medicaid Managed Care Coordination team services today. [name] Patient agreed to services and verbal consent obtained.  Engaged with patient  for by telephone forfollow up visit in response to referral for case management and/or care coordination services.   Assessments/Interventions:  Review of past medical history, allergies, medications, health status, including review of consultants reports, laboratory and other test data, was performed as part of comprehensive evaluation and provision of chronic care management services.  SDOH: (Social Determinant of Health) assessments and interventions performed: SDOH Interventions    Flowsheet Row Office Visit from 04/14/2022 in [facility] Patient Outreach from 06/27/2021 in [facility] Community Care Coordination  SDOH Interventions    Financial Strain Interventions Other (Comment)  [Ensured that patient's rx needs are being met. She has no current issues with medication fills and denies any cost-prohibitive issues with her medications.] Intervention Not Indicated     BSW completed a telephone outreach with patient, she states she is doing well. She did not reach out to [facility]. Patient states she will be moving to [facility] at the beginning of October to stay with her daughter for awhile until she finds her own place. No reources are needed at this time. BSW to close case.   Advanced Directives Status:  Not addressed in this encounter.  Care Plan                 Allergies  Allergen Reactions   Codeine Anaphylaxis   Shellfish Allergy  Swelling and Rash    Throat Sweling   Apple Cider Vinegar     syncope   Cinnamon Hives   Cozaar [Losartan]     Makes her feel like she is having a heart attack   Lisinopril     cough   Opium     Interferes with Keppra   Banana Rash    Cannot eat any while taking KEPPRA   Catfish [Fish Allergy] Rash   Orange Fruit [Citrus] Rash    Cannot have while taking KEPPRA   Tomato Itching and Rash    Medications Reviewed Today   Medications were not reviewed in this encounter     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) 02/24/2022   Controlled type 2 diabetes mellitus without complication, without long-term current use of insulin (HCC) 02/24/2022   Influenza vaccine refused 12/26/2019   Adjustment disorder with anxious mood 08/18/2018   Obesity (BMI 30-39.9) 09/17/2016   Internal hemorrhoid 09/16/2016   Adjustment disorder with mixed anxiety and depressed mood 12/04/2015   Primary insomnia 12/04/2015   Seizures (HCC) 08/24/2013   Class 2 severe obesity due to excess calories with serious comorbidity and body mass index (BMI) of 38.0 to 38.9 in adult (HCC)    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   Follow up:  Patient requests no follow-up at this time.  Plan: The  Patient has been provided with contact information for the Managed Medicaid care management team and has been advised to call with any health related questions or concerns.    [name], MHA [facility]  Managed [facility] Social Worker [phone]

## 2022-10-13 NOTE — Patient Instructions (Signed)
Tailored Plan Medicaid On July 1, some people on [place] Medicaid will move to a new kind of Medicaid health plan called a Tailored Plan. Tailored Plans cover your doctor visits, prescription drugs, and health care services.    If your [place] Medicaid will move to a Tailored Plan, you should have gotten a letter and welcome packet. If you're not sure, call your  Medicaid Enrollment Broker at [phone] and ask.  Check out these free materials, in [place] and [place], to learn more about your Tailored Plan: [email or web address]/Tailored-Plans/Toolkit  Tailored Care Management Services  TCM services are available to you now. If you are a Tailored Plan member or will be and want information about Tailored Care Management Services including rides to appointments and community and home services, call the Care Management provider for your county of residence:    [facility] ([place], [place])  Member Services: [phone] Behavioral Health Crisis Line: 8654296101, [place], [place], [place], [place])  Member Services: [phone] Behavioral Health Crisis Line: [phone]  [facility] Renard Hamper) Member Services: [phone] Behavioral Health Crisis Line: [phone]

## 2022-10-18 ENCOUNTER — Encounter (HOSPITAL_COMMUNITY): Payer: Self-pay | Admitting: Licensed Clinical Social Worker

## 2022-10-18 NOTE — Progress Notes (Signed)
`  Virtual Visit via Telephone Note  I connected with [name] on 10/13/2022 at 11:-11:45am by phone enabled telehealth application and verified that I am speaking withthe correct person using two identifiers. Patient does not have a computer or Internet.   I discussed the limitations of evaluation and management by telemedicine and the availability of in person appointments. The patient expressed understanding and agreed to proceed.  LOCATION:  Patient: Bus Provider: Home Office  Treatment Goal Addressed: Develop the ability to recognize, accept, and cope with feelings of depression AEB self report.  Progress Towards Goal: Progressing   History of Present Illness: Pt was referred for OP therapy by her neurologist Dr. [place] Lions for depression.   Observations/Objective: Patient presented for today's session on time and was alert, oriented x5, with no evidence or self-report of SI/HI or A/V H. Patient reported ongoing compliance with medication and denied any use of alcohol or illicit substances. Clinician inquired about patient's current emotional ratings, as well as any significant changes in thoughts, feelings or behaviors. Pt reports on her family which causes her stress. Pt reports 4/10 for depression and 4/10 for anxiety and 1/10 for irritability.  Cln  and pt reviewed her emotional ratings where pt tried to pinpoint her current stressors." Pt reports on her exercise regime which she continues., "my moods seem to have improved."Cln and pt again reviewed her progress in her wellness plan: exercise, getting enough sleep, eating more healthy, keeping toxic people out of her life. Today is the last day of counseling. Pt is moving to [place] to be with her child and grandchildren. Cln and pt discussed her moods which have improved. "I'm excited about my next chapter of my life."  Cln has worked with pt for the past 6 years with much progress. Cln wished pt safe travels.       Collaboration  of Care: Other: Continue to see her providers  Patient/Guardian was advised Release of Information must be obtained prior to any record release in order to collaborate their care with an outside provider.    Patient/guardian was advised if not not already done so to contact the registration department to sign all necessary forms in order for [place] to release information regarding their care.   Consent: Patient/Guardian gives verbal consent for treatment and assignment of benefits for services provided during this visit. Patient/Guardian expressed understanding and agreed to proceed.      Assessment and plan: Discharge from services. Pt is moving out of state. Diagnosis: Depression  Follow Up Instructions:   I discussed the assessment and treatment plan with the patient. The patient was provided an opportunity to ask questions and all were answered. The patient agreed with the plan and demonstrated an understanding of the instructions.   The patient was advised to call back or seek an in-person evaluation if the symptoms worsen or if the condition fails to improve as anticipated.  I provided 45 minutes of non-face-to-face time during this encounter.   Arantza Darrington S, LCAS

## 2022-10-20 ENCOUNTER — Ambulatory Visit (HOSPITAL_COMMUNITY): Payer: 59 | Admitting: Licensed Clinical Social Worker

## 2022-10-21 ENCOUNTER — Other Ambulatory Visit: Payer: Self-pay

## 2022-11-03 ENCOUNTER — Ambulatory Visit (HOSPITAL_COMMUNITY): Payer: 59 | Admitting: Licensed Clinical Social Worker

## 2022-11-23 ENCOUNTER — Other Ambulatory Visit: Payer: Self-pay

## 2022-11-24 ENCOUNTER — Other Ambulatory Visit: Payer: Self-pay

## 2023-02-09 ENCOUNTER — Ambulatory Visit: Payer: Medicaid Other | Admitting: Family Medicine

## 2023-02-19 ENCOUNTER — Other Ambulatory Visit: Payer: Self-pay | Admitting: Critical Care Medicine

## 2023-02-19 ENCOUNTER — Other Ambulatory Visit: Payer: Self-pay

## 2023-02-19 NOTE — Telephone Encounter (Signed)
Requested medication (s) are due for refill today: Yes  Requested medication (s) are on the active medication list: Yes  Last refill:  10/21/21   Future visit scheduled: Yes  Notes to clinic:  Unable to refill per protocol, Rx expired.      Requested Prescriptions  Pending Prescriptions Disp Refills   loratadine (CLARITIN) 10 MG tablet 60 tablet 2    Sig: TAKE 1 TABLET (10 MG TOTAL) BY MOUTH DAILY.     Ear, Nose, and Throat:  Antihistamines 2 Failed - 02/19/2023  4:01 PM      Failed - Cr in normal range and within 360 days    Creat  Date Value Ref Range Status  02/18/2016 0.89 0.50 - 1.05 mg/dL Final    Comment:      For patients > or = 66 years of age: The upper reference limit for Creatinine is approximately 13% higher for people identified as African-American.      Creatinine, Ser  Date Value Ref Range Status  09/15/2022 1.03 (H) 0.57 - 1.00 mg/dL Final         Passed - Valid encounter within last 12 months    Recent Outpatient Visits           4 months ago Essential hypertension   [facility] [facility] - [facility] Dept Of [place]. [facility] Lois Huxley, Cornelius Moras, RPH-CPP   5 months ago Essential hypertension   [facility] [place] [facility] Of [place]. [facility] [name], MD   6 months ago Essential hypertension   [facility] [place] - [facility] Dept Of [place]. [facility] Drucilla Chalet, RPH-CPP   7 months ago Essential hypertension   [facility] [place] - [facility] Dept Of [place]. [facility] Drucilla Chalet, RPH-CPP   8 months ago Essential hypertension   [facility] [place] - A Dept Of [place]. [facility] [name], MD       Future Appointments             In 4 days Hoy Register, MD [facility] - A Dept Of [place]igha Bridegroom. [facility]

## 2023-02-22 ENCOUNTER — Other Ambulatory Visit: Payer: Self-pay

## 2023-02-22 ENCOUNTER — Telehealth: Payer: Self-pay

## 2023-02-22 MED ORDER — LORATADINE 10 MG PO TABS
10.0000 mg | ORAL_TABLET | Freq: Every day | ORAL | 2 refills | Status: AC
  Filled 2023-02-22: qty 60, 60d supply, fill #0
  Filled 2023-05-19: qty 30, 30d supply, fill #1
  Filled 2023-07-26: qty 30, 30d supply, fill #2
  Filled 2023-09-14: qty 30, 30d supply, fill #3

## 2023-02-22 NOTE — Telephone Encounter (Signed)
Pharmacy Patient Advocate Encounter   Received notification from CoverMyMeds that prior authorization for TRETINOIN is required/requested.   Insurance verification completed.   The patient is insured through [employer] .   Per test claim: PA required; PA submitted to above mentioned insurance via CoverMyMeds Key/confirmation #/EOC [facility] Status is pending

## 2023-02-23 ENCOUNTER — Other Ambulatory Visit: Payer: Self-pay

## 2023-02-23 ENCOUNTER — Encounter: Payer: Self-pay | Admitting: Family Medicine

## 2023-02-23 ENCOUNTER — Ambulatory Visit: Payer: 59 | Attending: Family Medicine | Admitting: Family Medicine

## 2023-02-23 VITALS — BP 241/99 | HR 68 | Ht 62.0 in | Wt 200.4 lb

## 2023-02-23 DIAGNOSIS — R569 Unspecified convulsions: Secondary | ICD-10-CM

## 2023-02-23 DIAGNOSIS — G629 Polyneuropathy, unspecified: Secondary | ICD-10-CM | POA: Diagnosis not present

## 2023-02-23 DIAGNOSIS — R7303 Prediabetes: Secondary | ICD-10-CM

## 2023-02-23 DIAGNOSIS — I10 Essential (primary) hypertension: Secondary | ICD-10-CM

## 2023-02-23 DIAGNOSIS — E2839 Other primary ovarian failure: Secondary | ICD-10-CM

## 2023-02-23 LAB — POCT GLYCOSYLATED HEMOGLOBIN (HGB A1C): HbA1c, POC (controlled diabetic range): 6 % (ref 0.0–7.0)

## 2023-02-23 MED ORDER — DULOXETINE HCL 60 MG PO CPEP
60.0000 mg | ORAL_CAPSULE | Freq: Every day | ORAL | 1 refills | Status: DC
  Filled 2023-02-23: qty 90, 90d supply, fill #0
  Filled 2023-05-19 (×2): qty 90, 90d supply, fill #1

## 2023-02-23 MED ORDER — TRETINOIN 0.05 % EX CREA
TOPICAL_CREAM | Freq: Every day | CUTANEOUS | 2 refills | Status: AC
  Filled 2023-02-23: qty 45, 30d supply, fill #0
  Filled 2023-05-19 (×2): qty 45, 30d supply, fill #1
  Filled 2023-08-05 – 2023-08-23 (×2): qty 45, 30d supply, fill #2

## 2023-02-23 NOTE — Progress Notes (Signed)
 Subjective:  Patient ID: [name], female    DOB: 06-21-1955  Age: 68 y.o. MRN: [id number]  CC: Medical Management of Chronic Issues (Numbness and pain in right foot/)   HPI [name] is a 68 y.o. year old female patient of Dr. Brien with a history of prediabetes, hypertension, depression, seizures, neuropathy, carpal tunnel syndrome.  Interval History: Discussed the use of AI scribe software for clinical note transcription with the patient, who gave verbal consent to proceed.  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 She reports that gabapentin , previously prescribed for this issue, was ineffective in managing her symptoms. The patient describes the neuropathy as feeling like she's walking on pins and needles. She uses a cane to prevent falls due to the discomfort and instability caused by the neuropathy.  In addition to her neuropathy, the patient also has epilepsy, managed with time-release Keppra . She was diagnosed with epilepsy after an episode of passing out in 2015.  The patient also has prediabetes, managed with metformin . Her most recent A1c was 6.0, indicating good control of her blood sugar levels. She has been labeled as having diabetes in the past, leading to some confusion about her diagnosis. I have reviewed her chart and she has no A1c adequate at 6.5.  She has high blood pressure, for which she is on antihypertensive.  On the day of the appointment, she had not yet taken her blood pressure medication due to not having eaten. She reports that she needs to take her medication with food to avoid stomach upset.  Her blood pressure is severely elevated at 241/99.        Past Medical History:  Diagnosis Date   Anxiety    Carpal tunnel syndrome    Diabetes mellitus without complication (HCC)    Diabetes mellitus, type II (HCC)    DYSTONIA 01/04/2007   Qualifier: Diagnosis of  By: Gerlene NP, Devere     Facial droop 10/09/2013   Hemorrhoid     Hypertension    Major depressive disorder, recurrent episode (HCC) 03/18/2006   Qualifier: Diagnosis of  By: Geralene Service     Obesity, unspecified 10/17/2007   Qualifier: Diagnosis of  By: Wonda PHD, Jeannie     Prediabetes 10/09/2013   Rectal bleeding 07/03/2015   Seizures (HCC)    Seizures (HCC) 08/24/2013    Past Surgical History:  Procedure Laterality Date   ABDOMINAL HYSTERECTOMY     COLONOSCOPY N/A 07/15/2015   Dr. Harvey: small-mouth diverticula, small non-bleeding internal hemorrhoids, redundant colon    HERNIA REPAIR     MYOMECTOMY      Family History  Problem Relation Age of Onset   Asthma Mother    Hypertension Father    Heart disease Father    Stroke Father    Cancer Sister        uterus cancer    Diabetes Maternal Uncle    Breast cancer Cousin    Colon cancer Neg Hx     Social History   Socioeconomic History   Marital status: Divorced    Spouse name: Not on file   Number of children: 1   Years of education: Not on file   Highest education level: Not on file  Occupational History   Not on file  Tobacco Use   Smoking status: Never   Smokeless tobacco: Never  Vaping Use   Vaping status: Never Used  Substance and Sexual Activity   Alcohol use: No    Alcohol/week:  0.0 standard drinks of alcohol   Drug use: No   Sexual activity: Not Currently  Other Topics Concern   Not on file  Social History Narrative   Right handed    Lives alone    Lives in apartment  on 6th floor    No caffeine   Social Drivers of Health   Financial Resource Strain: Low Risk  (04/14/2022)   Overall Financial Resource Strain (CARDIA)    Difficulty of Paying Living Expenses: Not hard at all  Food Insecurity: Food Insecurity Present (04/14/2022)   Hunger Vital Sign    Worried About Running Out of Food in the Last Year: Sometimes true    Ran Out of Food in the Last Year: Never true  Transportation Needs: No Transportation Needs (04/14/2022)   PRAPARE - Doctor, General Practice (Medical): No    Lack of Transportation (Non-Medical): No  Physical Activity: Sufficiently Active (04/14/2022)   Exercise Vital Sign    Days of Exercise per Week: 5 days    Minutes of Exercise per Session: 30 min  Stress: Stress Concern Present (04/14/2022)   [employer] of Occupational Health - Occupational Stress Questionnaire    Feeling of Stress : To some extent  Social Connections: Socially Isolated (04/14/2022)   Social Connection and Isolation Panel [NHANES]    Frequency of Communication with Friends and Family: Twice a week    Frequency of Social Gatherings with Friends and Family: Twice a week    Attends Religious Services: Never    Database Administrator or Organizations: No    Attends Engineer, Structural: Never    Marital Status: Never married    Allergies  Allergen Reactions   Codeine Anaphylaxis   Shellfish Allergy Swelling and Rash    Throat Sweling   Apple Cider Vinegar     syncope   Cinnamon Hives   Cozaar  [Losartan ]     Makes her feel like she is having a heart attack   Lisinopril      cough   Opium     Interferes with Keppra    Banana Rash    Cannot eat any while taking KEPPRA    Catfish [Fish Allergy] Rash   Orange Fruit [Citrus] Rash    Cannot have while taking KEPPRA    Tomato Itching and Rash    Outpatient Medications Prior to Visit  Medication Sig Dispense Refill   Accu-Chek Softclix Lancets lancets Use as instructed 100 each 12   amLODipine  (NORVASC ) 10 MG tablet Take 1 tablet (10 mg total) by mouth daily. 90 tablet 3   Blood Glucose Monitoring Suppl (ACCU-CHEK GUIDE) w/Device KIT Use to check blood sugar twice day 1 kit 0   Blood Pressure Monitoring (BLOOD PRESSURE KIT) DEVI Use to measure blood pressure 1 each 0   carvedilol  (COREG ) 25 MG tablet Take 1 tablet (25 mg total) by mouth 2 (two) times daily with a meal. 180 tablet 1   glucose blood (ACCU-CHEK GUIDE) test strip Use as instructed 100 each 12   hydrALAZINE   (APRESOLINE ) 50 MG tablet Take 1 tablet (50 mg total) by mouth 3 (three) times daily. 270 tablet 1   levETIRAcetam  (KEPPRA  XR) 500 MG 24 hr tablet Take 1 tablet (500 mg total) by mouth 3 (three) times daily. 270 tablet 3   loratadine  (CLARITIN ) 10 MG tablet Take 1 tablet (10 mg total) by mouth daily. 60 tablet 2   metFORMIN  (GLUCOPHAGE ) 500 MG tablet Take 1 tablet (500 mg total)  by mouth daily with breakfast. 90 tablet 2   spironolactone  (ALDACTONE ) 50 MG tablet Take 1 tablet (50 mg total) by mouth daily. 90 tablet 2   Vitamin D , Ergocalciferol , (DRISDOL ) 1.25 MG (50000 UNIT) CAPS capsule Take 1 capsule (50,000 Units total) by mouth once a week. 12 capsule 3   tretinoin  (RETIN-A ) 0.05 % cream Apply topically at bedtime. 45 g 2   mirtazapine  (REMERON ) 30 MG tablet Take 1 tablet (30 mg total) by mouth at bedtime. (Patient not taking: Reported on 02/23/2023) 90 tablet 1   No facility-administered medications prior to visit.     ROS Review of Systems  Constitutional:  Negative for activity change and appetite change.  HENT:  Negative for sinus pressure and sore throat.   Respiratory:  Negative for chest tightness, shortness of breath and wheezing.   Cardiovascular:  Negative for chest pain and palpitations.  Gastrointestinal:  Negative for abdominal distention, abdominal pain and constipation.  Genitourinary: Negative.   Musculoskeletal: Negative.   Neurological:  Positive for numbness.  Psychiatric/Behavioral:  Negative for behavioral problems and dysphoric mood.     Objective:  BP (!) 241/99   Pulse 68   Ht 5' 2 (1.575 m)   Wt 200 lb 6.4 oz (90.9 kg)   SpO2 95%   BMI 36.65 kg/m      02/23/2023    9:00 AM 10/07/2022    4:13 PM 10/06/2022   11:49 AM  BP/Weight  Systolic BP 241 126 172  Diastolic BP 99 74 78  Wt. (Lbs) 200.4    BMI 36.65 kg/m2        Physical Exam Constitutional:      Appearance: She is well-developed.  Cardiovascular:     Rate and Rhythm: Normal rate.      Heart sounds: Normal heart sounds. No murmur heard. Pulmonary:     Effort: Pulmonary effort is normal.     Breath sounds: Normal breath sounds. No wheezing or rales.  Chest:     Chest wall: No tenderness.  Abdominal:     General: Bowel sounds are normal. There is no distension.     Palpations: Abdomen is soft. There is no mass.     Tenderness: There is no abdominal tenderness.  Musculoskeletal:        General: Normal range of motion.     Right lower leg: No edema.     Left lower leg: No edema.  Neurological:     Mental Status: She is alert and oriented to person, place, and time.  Psychiatric:        Mood and Affect: Mood normal.        Latest Ref Rng & Units 09/15/2022    9:28 AM 06/04/2022    1:42 PM 02/24/2022   10:10 AM  CMP  Glucose 70 - 99 mg/dL 82  79  86   BUN 8 - 27 mg/dL 11  11  7    Creatinine 0.57 - 1.00 mg/dL 8.96  9.22  9.20   Sodium 134 - 144 mmol/L 138  141  143   Potassium 3.5 - 5.2 mmol/L 5.0  4.2  3.9   Chloride 96 - 106 mmol/L 103  103  104   CO2 20 - 29 mmol/L 21  23  24    Calcium 8.7 - 10.3 mg/dL 9.5  9.9  9.6     Lipid Panel     Component Value Date/Time   CHOL 103 09/15/2022 0928   TRIG 115 09/15/2022 0928  HDL 42 09/15/2022 0928   CHOLHDL 2.5 09/15/2022 0928   CHOLHDL 2.3 02/18/2016 0948   VLDL 25 02/18/2016 0948   LDLCALC 40 09/15/2022 0928    CBC    Component Value Date/Time   WBC 7.4 09/15/2022 0928   WBC 6.9 02/18/2016 0948   RBC 4.75 09/15/2022 0928   RBC 4.74 02/18/2016 0948   HGB 12.9 09/15/2022 0928   HCT 40.1 09/15/2022 0928   PLT 314 09/15/2022 0928   MCV 84 09/15/2022 0928   MCH 27.2 09/15/2022 0928   MCH 26.8 (L) 02/18/2016 0948   MCHC 32.2 09/15/2022 0928   MCHC 32.8 02/18/2016 0948   RDW 14.7 09/15/2022 0928   LYMPHSABS 2.1 09/15/2022 0928   MONOABS 483 02/18/2016 0948   EOSABS 0.1 09/15/2022 0928   BASOSABS 0.0 09/15/2022 0928    Lab Results  Component Value Date   HGBA1C 6.0 02/23/2023    Assessment &  Plan:      Neuropathy Severe right foot neuropathy, previously unresponsive to Gabapentin . Unclear if this is related to prediabetes or another cause. -Start Cymbalta  60mg  daily for neuropathy. -Check Vitamin B12 levels to rule out deficiency as a cause of neuropathy.  Prediabetes Well controlled with Metformin , recent A1c 6.0. -Continue Metformin . -I have resolved since diagnosis of type 2 diabetes mellitus as she does not have any clear A1c of 6.5 or greater documented in her chart  Epilepsy On Keppra , no recent seizures reported. -Continue Keppra . -Continue to follow-up with neurology  Hypertension Blood pressure significantly elevated during visit, patient had not taken morning medications. -Advise patient to take medications in clinic.  She states she spreads out her medications over the course of the day to avoid having to take so many medications at 1 -Recheck blood pressure in 30 minutes after medication administration. -Advised to take all her medications at her next visit and eat prior to her appointment and I will recheck her blood pressure  General Health Maintenance -Order bone density study to screen for osteoporosis. -Return visit in 1 month with all medications for review.          Meds ordered this encounter  Medications   DULoxetine  (CYMBALTA ) 60 MG capsule    Sig: Take 1 capsule (60 mg total) by mouth daily. For neuropathy    Dispense:  90 capsule    Refill:  1   tretinoin  (RETIN-A ) 0.05 % cream    Sig: Apply topically at bedtime.    Dispense:  45 g    Refill:  2    Follow-up: Return in about 1 month (around 03/23/2023) for Blood Pressure follow-up with PCP.       Corrina Sabin, MD, FAAFP. [facility] [place], [place] [phone]   02/23/2023, 12:30 PM

## 2023-02-23 NOTE — Patient Instructions (Signed)
 Managing Your Hypertension Hypertension, also called high blood pressure, is when the force of the blood pressing against the walls of the arteries is too strong. Arteries are blood vessels that carry blood from your heart throughout your body. Hypertension forces the heart to work harder to pump blood and may cause the arteries to become narrow or stiff. Understanding blood pressure readings A blood pressure reading includes a higher number over a lower number: The first, or top, number is called the systolic pressure. It is a measure of the pressure in your arteries as your heart beats. The second, or bottom number, is called the diastolic pressure. It is a measure of the pressure in your arteries as the heart relaxes. For most people, a normal blood pressure is below 120/80. Your personal target blood pressure may vary depending on your medical conditions, your age, and other factors. Blood pressure is classified into four stages. Based on your blood pressure reading, your health care provider may use the following stages to determine what type of treatment you need, if any. Systolic pressure and diastolic pressure are measured in a unit called millimeters of mercury (mmHg). Normal Systolic pressure: below 120. Diastolic pressure: below 80. Elevated Systolic pressure: 120-129. Diastolic pressure: below 80. Hypertension stage 1 Systolic pressure: 130-139. Diastolic pressure: 80-89. Hypertension stage 2 Systolic pressure: 140 or above. Diastolic pressure: 90 or above. How can this condition affect me? Managing your hypertension is very important. Over time, hypertension can damage the arteries and decrease blood flow to parts of the body, including the brain, heart, and kidneys. Having untreated or uncontrolled hypertension can lead to: A heart attack. A stroke. A weakened blood vessel (aneurysm). Heart failure. Kidney damage. Eye damage. Memory and concentration problems. Vascular  dementia. What actions can I take to manage this condition? Hypertension can be managed by making lifestyle changes and possibly by taking medicines. Your health care provider will help you make a plan to bring your blood pressure within a normal range. You may be referred for counseling on a healthy diet and physical activity. Nutrition  Eat a diet that is high in fiber and potassium, and low in salt (sodium), added sugar, and fat. An example eating plan is called the DASH diet. DASH stands for Dietary Approaches to Stop Hypertension. To eat this way: Eat plenty of fresh fruits and vegetables. Try to fill one-half of your plate at each meal with fruits and vegetables. Eat whole grains, such as whole-wheat pasta, brown rice, or whole-grain bread. Fill about one-fourth of your plate with whole grains. Eat low-fat dairy products. Avoid fatty cuts of meat, processed or cured meats, and poultry with skin. Fill about one-fourth of your plate with lean proteins such as fish, chicken without skin, beans, eggs, and tofu. Avoid pre-made and processed foods. These tend to be higher in sodium, added sugar, and fat. Reduce your daily sodium intake. Many people with hypertension should eat less than 1,500 mg of sodium a day. Lifestyle  Work with your health care provider to maintain a healthy body weight or to lose weight. Ask what an ideal weight is for you. Get at least 30 minutes of exercise that causes your heart to beat faster (aerobic exercise) most days of the week. Activities may include walking, swimming, or biking. Include exercise to strengthen your muscles (resistance exercise), such as weight lifting, as part of your weekly exercise routine. Try to do these types of exercises for 30 minutes at least 3 days a week. Do  not use any products that contain nicotine  or tobacco. These products include cigarettes, chewing tobacco, and vaping devices, such as e-cigarettes. If you need help quitting, ask your  health care provider. Control any long-term (chronic) conditions you have, such as high cholesterol or diabetes. Identify your sources of stress and find ways to manage stress. This may include meditation, deep breathing, or making time for fun activities. Alcohol use Do not drink alcohol if: Your health care provider tells you not to drink. You are pregnant, may be pregnant, or are planning to become pregnant. If you drink alcohol: Limit how much you have to: 0-1 drink a day for women. 0-2 drinks a day for men. Know how much alcohol is in your drink. In the [place] one drink equals one 12 oz bottle of beer (355 mL), one 5 oz glass of wine (148 mL), or one 1 oz glass of hard liquor (44 mL). Medicines Your health care provider may prescribe medicine if lifestyle changes are not enough to get your blood pressure under control and if: Your systolic blood pressure is 130 or higher. Your diastolic blood pressure is 80 or higher. Take medicines only as told by your health care provider. Follow the directions carefully. Blood pressure medicines must be taken as told by your health care provider. The medicine does not work as well when you skip doses. Skipping doses also puts you at risk for problems. Monitoring Before you monitor your blood pressure: Do not smoke, drink caffeinated beverages, or exercise within 30 minutes before taking a measurement. Use the bathroom and empty your bladder (urinate). Sit quietly for at least 5 minutes before taking measurements. Monitor your blood pressure at home as told by your health care provider. To do this: Sit with your back straight and supported. Place your feet flat on the floor. Do not cross your legs. Support your arm on a flat surface, such as a table. Make sure your upper arm is at heart level. Each time you measure, take two or three readings one minute apart and record the results. You may also need to have your blood pressure checked regularly by  your health care provider. General information Talk with your health care provider about your diet, exercise habits, and other lifestyle factors that may be contributing to hypertension. Review all the medicines you take with your health care provider because there may be side effects or interactions. Keep all follow-up visits. Your health care provider can help you create and adjust your plan for managing your high blood pressure. Where to find more information National Heart, Lung, and Blood Institute: PopSte[employer].is American Heart Association: www.heart.org Contact a health care provider if: You think you are having a reaction to medicines you have taken. You have repeated (recurrent) headaches. You feel dizzy. You have swelling in your ankles. You have trouble with your vision. Get help right away if: You develop a severe headache or confusion. You have unusual weakness or numbness, or you feel faint. You have severe pain in your chest or abdomen. You vomit repeatedly. You have trouble breathing. These symptoms may be an emergency. Get help right away. Call 911. Do not wait to see if the symptoms will go away. Do not drive yourself to the hospital. Summary Hypertension is when the force of blood pumping through your arteries is too strong. If this condition is not controlled, it may put you at risk for serious complications. Your personal target blood pressure may vary depending on your medical conditions,  your age, and other factors. For most people, a normal blood pressure is less than 120/80. Hypertension is managed by lifestyle changes, medicines, or both. Lifestyle changes to help manage hypertension include losing weight, eating a healthy, low-sodium diet, exercising more, stopping smoking, and limiting alcohol. This information is not intended to replace advice given to you by your health care provider. Make sure you discuss any questions you have with your health care  provider. Document Revised: 09/19/2020 Document Reviewed: 09/19/2020 Elsevier Patient Education  2024 [employer].

## 2023-02-24 LAB — CMP14+EGFR
ALT: 15 [IU]/L (ref 0–32)
AST: 19 [IU]/L (ref 0–40)
Albumin: 4.2 g/dL (ref 3.9–4.9)
Alkaline Phosphatase: 103 [IU]/L (ref 44–121)
BUN/Creatinine Ratio: 14 (ref 12–28)
BUN: 10 mg/dL (ref 8–27)
Bilirubin Total: 0.3 mg/dL (ref 0.0–1.2)
CO2: 25 mmol/L (ref 20–29)
Calcium: 9.9 mg/dL (ref 8.7–10.3)
Chloride: 102 mmol/L (ref 96–106)
Creatinine, Ser: 0.74 mg/dL (ref 0.57–1.00)
Globulin, Total: 3 g/dL (ref 1.5–4.5)
Glucose: 81 mg/dL (ref 70–99)
Potassium: 3.8 mmol/L (ref 3.5–5.2)
Sodium: 142 mmol/L (ref 134–144)
Total Protein: 7.2 g/dL (ref 6.0–8.5)
eGFR: 90 mL/min/{1.73_m2} (ref >59)

## 2023-02-24 LAB — VITAMIN B12: Vitamin B-12: 956 pg/mL (ref 232–1245)

## 2023-02-26 ENCOUNTER — Other Ambulatory Visit: Payer: Self-pay

## 2023-03-08 ENCOUNTER — Ambulatory Visit (INDEPENDENT_AMBULATORY_CARE_PROVIDER_SITE_OTHER): Payer: 59 | Admitting: Podiatry

## 2023-03-08 ENCOUNTER — Encounter: Payer: Self-pay | Admitting: Podiatry

## 2023-03-08 DIAGNOSIS — M79674 Pain in right toe(s): Secondary | ICD-10-CM

## 2023-03-08 DIAGNOSIS — M79675 Pain in left toe(s): Secondary | ICD-10-CM | POA: Diagnosis not present

## 2023-03-08 DIAGNOSIS — E119 Type 2 diabetes mellitus without complications: Secondary | ICD-10-CM

## 2023-03-08 DIAGNOSIS — B351 Tinea unguium: Secondary | ICD-10-CM

## 2023-03-08 NOTE — Progress Notes (Signed)
Subjective: Chief Complaint  Patient presents with   [facility]    RM#13 DFC  right foot big toe is dark in color and would like to discuss.    68 year old female presents the office with concerns of thick, discolored toenails that she cannot trim herself and they are causing discomfort as well as for calluses.  She says that particular right big toenails become more tender causing discomfort.  She has not seen any drainage or any swelling to the toenails.  She also gets calluses to her feet that cause discomfort.  PCP: Dr. Hoy Register, MD Last seen 02/23/2023  A1c 6.0 on 26/2024  Objective: AAO x3, NAD DP/PT pulses palpable bilaterally, CRT less than 3 seconds Nails are hypertrophic, dystrophic, brittle, discolored, elongated 10. No surrounding redness or drainage. Tenderness nails 1-5 bilaterally.  There are some dried blood along the nail distally which is able to debride.  There is no signs of any hyperpigmentation of the surrounding skin.  The nails are uniform in color but the other nails. There is a annular hyper pigmented lesion present on the right third toe consistent with a mole.  Still unchanged. She also has on the left foot that she states "has been there my whole life".  Appear to be a form color and borders. No pain with calf compression, swelling, warmth, erythema  Assessment: Type 2 diabetes with neuropathy, symptomatic onychomycosis  Plan: -All treatment options discussed with the patient including all alternatives, risks, complications.  -Sharply debrided nails x10 without any complications or bleeding -Discussed moisturizer to the feet daily.  -Discussed daily foot inspection -Patient encouraged to call the office with any questions, concerns, change in symptoms.   Vivi Barrack DPM

## 2023-04-08 ENCOUNTER — Ambulatory Visit: Payer: 59

## 2023-04-08 DIAGNOSIS — E119 Type 2 diabetes mellitus without complications: Secondary | ICD-10-CM

## 2023-04-08 DIAGNOSIS — M2141 Flat foot [pes planus] (acquired), right foot: Secondary | ICD-10-CM

## 2023-04-13 NOTE — Progress Notes (Signed)
 Patient presents to the office today for diabetic shoe and insole measuring.  Patient was measured with brannock device to determine size and width for 1 pair of extra depth shoes and for 3 pair of insoles.   Documentation of medical necessity will be sent to patient's treating diabetic doctor to verify and sign.   Patient's diabetic provider: Kandice Hams MD   Shoes and insoles will be ordered at that time and patient will be notified for an appointment for fitting when they arrive.   Shoe size (per patient): 8WD Shoe choice:   [id number] / [id number] Shoe size ordered: 8WD

## 2023-05-15 ENCOUNTER — Telehealth: Payer: Self-pay

## 2023-05-15 NOTE — Telephone Encounter (Signed)
 Called LM for patient to please cb and set appt for fitting / to try on  Ppw expires 7.17.2025

## 2023-05-19 ENCOUNTER — Other Ambulatory Visit: Payer: Self-pay

## 2023-05-19 ENCOUNTER — Other Ambulatory Visit: Payer: Self-pay | Admitting: Critical Care Medicine

## 2023-05-19 MED ORDER — CARVEDILOL 25 MG PO TABS
25.0000 mg | ORAL_TABLET | Freq: Two times a day (BID) | ORAL | 1 refills | Status: DC
  Filled 2023-05-19: qty 180, 90d supply, fill #0

## 2023-05-20 ENCOUNTER — Other Ambulatory Visit: Payer: Self-pay

## 2023-06-08 ENCOUNTER — Ambulatory Visit (INDEPENDENT_AMBULATORY_CARE_PROVIDER_SITE_OTHER): Payer: 59 | Admitting: Podiatry

## 2023-06-08 DIAGNOSIS — M2141 Flat foot [pes planus] (acquired), right foot: Secondary | ICD-10-CM

## 2023-06-08 DIAGNOSIS — M79674 Pain in right toe(s): Secondary | ICD-10-CM

## 2023-06-08 DIAGNOSIS — M79675 Pain in left toe(s): Secondary | ICD-10-CM

## 2023-06-08 DIAGNOSIS — M2142 Flat foot [pes planus] (acquired), left foot: Secondary | ICD-10-CM | POA: Diagnosis not present

## 2023-06-08 DIAGNOSIS — E119 Type 2 diabetes mellitus without complications: Secondary | ICD-10-CM

## 2023-06-08 DIAGNOSIS — B351 Tinea unguium: Secondary | ICD-10-CM

## 2023-06-08 NOTE — Progress Notes (Signed)
 Patient presents today to pick up diabetic shoes and insoles.  Patient was dispensed 1 pair of diabetic shoes and 3 pairs of total contact diabetic insoles. Fit was satisfactory. Instructions for break-in and wear was reviewed and a copy was given to the patient.   Re-appointment for regularly scheduled diabetic foot care visits or if they should experience any trouble with the shoes or insoles.

## 2023-06-10 NOTE — Progress Notes (Signed)
 Subjective: Chief Complaint  Patient presents with   Nail Problem    RM#11 Pt is here for routine foot care. F/U with tricia for diabetic shoes.     68 year old female presents the office with concerns of thick, discolored toenails that she cannot trim herself and they are causing discomfort as well as for calluses.  She does not report any recent injuries or changes otherwise.  No open lesions.  Discharge present significant diabetic shoes.   PCP: Dr. Joaquin Mulberry, MD Last seen 02/23/2023  A1c 6.0 on 02/23/2023  Objective: AAO x3, NAD DP/PT pulses palpable bilaterally, CRT less than 3 seconds Nails are hypertrophic, dystrophic, brittle, discolored, elongated 10. No surrounding redness or drainage. Tenderness nails 1-5 bilaterally. There is no signs of any hyperpigmentation of the surrounding skin.  The nails are uniform in color but the other nails. There is a annular hyper pigmented lesion present on the right third toe consistent with a mole.  Still unchanged. She also has on the left foot that she states "has been there my whole life".  Appear to be a form color and borders. No pain with calf compression, swelling, warmth, erythema  Assessment: Type 2 diabetes with neuropathy, symptomatic onychomycosis  Plan: -All treatment options discussed with the patient including all alternatives, risks, complications.  -Sharply debrided nails x10 without any complications or bleeding -Discussed moisturizer to the feet daily.  -Diabetic shoes were dispensed today.  See separate note for this. -Discussed daily foot inspection -Patient encouraged to call the office with any questions, concerns, change in symptoms.   Charity Conch DPM

## 2023-06-16 ENCOUNTER — Telehealth: Payer: Self-pay | Admitting: Family Medicine

## 2023-06-16 ENCOUNTER — Other Ambulatory Visit: Payer: Self-pay | Admitting: Family Medicine

## 2023-06-16 DIAGNOSIS — R7303 Prediabetes: Secondary | ICD-10-CM

## 2023-06-16 DIAGNOSIS — Z0000 Encounter for general adult medical examination without abnormal findings: Secondary | ICD-10-CM

## 2023-06-16 NOTE — Telephone Encounter (Signed)
 Copied from CRM [id number]. Topic: Clinical - Medication Refill >> Jun 16, 2023 10:50 AM Ethelle Herb L wrote: Medication: metFORMIN  (GLUCOPHAGE ) 500 MG tablet levETIRAcetam  (KEPPRA  XR) 500 MG 24 hr tablet  Has the patient contacted their pharmacy? Yes Told to contact office for further refills.   This is the patient's preferred pharmacy:  [facility] [address] [place] [address] Phone: [phone] Fax: [phone]  Is this the correct pharmacy for this prescription? Yes  Has the prescription been filled recently? No  Is the patient out of the medication? No  Has the patient been seen for an appointment in the last year OR does the patient have an upcoming appointment? Yes  Can we respond through MyChart? No  Agent: Please be advised that Rx refills may take up to 3 business days. We ask that you follow-up with your pharmacy.

## 2023-06-18 ENCOUNTER — Other Ambulatory Visit: Payer: Self-pay

## 2023-06-18 MED ORDER — LEVETIRACETAM ER 500 MG PO TB24
500.0000 mg | ORAL_TABLET | Freq: Three times a day (TID) | ORAL | 0 refills | Status: AC
  Filled 2023-06-18 – 2023-07-26 (×2): qty 270, 90d supply, fill #0

## 2023-06-18 MED ORDER — METFORMIN HCL 500 MG PO TABS
500.0000 mg | ORAL_TABLET | Freq: Every day | ORAL | 0 refills | Status: DC
Start: 2023-06-18 — End: 2023-08-03
  Filled 2023-06-18 – 2023-07-26 (×2): qty 90, 90d supply, fill #0

## 2023-06-18 NOTE — Telephone Encounter (Signed)
 Requested Prescriptions  Pending Prescriptions Disp Refills   metFORMIN  (GLUCOPHAGE ) 500 MG tablet 90 tablet 0    Sig: Take 1 tablet (500 mg total) by mouth daily with breakfast.     Endocrinology:  Diabetes - Biguanides Passed - 06/18/2023  1:43 PM      Passed - Cr in normal range and within 360 days    Creat  Date Value Ref Range Status  02/18/2016 0.89 0.50 - 1.05 mg/dL Final    Comment:      For patients > or = 66 years of age: The upper reference limit for Creatinine is approximately 13% higher for people identified as African-American.      Creatinine, Ser  Date Value Ref Range Status  02/23/2023 0.74 0.57 - 1.00 mg/dL Final         Passed - HBA1C is between 0 and 7.9 and within 180 days    HbA1c, POC (prediabetic range)  Date Value Ref Range Status  08/27/2020 5.7 5.7 - 6.4 % Final   HbA1c, POC (controlled diabetic range)  Date Value Ref Range Status  02/23/2023 6.0 0.0 - 7.0 % Final         Passed - eGFR in normal range and within 360 days    GFR, Est African American  Date Value Ref Range Status  08/17/2014 84 >=60 mL/min Final   GFR calc Af Amer  Date Value Ref Range Status  12/26/2019 69 >59 mL/min/1.73 Final    Comment:    **In accordance with recommendations from the NKF-ASN Task force,**   [facility] is in the process of updating its eGFR calculation to the   2021 CKD-EPI creatinine equation that estimates kidney function   without a race variable.    GFR, Est Non African American  Date Value Ref Range Status  08/17/2014 73 >=60 mL/min Final    Comment:      The estimated GFR is a calculation valid for adults (>=51 years old) that uses the CKD-EPI algorithm to adjust for age and sex. It is   not to be used for children, pregnant women, hospitalized patients,    patients on dialysis, or with rapidly changing kidney function. According to the NKDEP, eGFR >89 is normal, 60-89 shows mild impairment, 30-59 shows moderate impairment, 15-29 shows  severe impairment and <15 is ESRD.     Footnotes:  (1) ** Please note change in unit of measure and reference range(s). **      GFR calc non Af Amer  Date Value Ref Range Status  12/26/2019 60 >59 mL/min/1.73 Final   eGFR  Date Value Ref Range Status  02/23/2023 90 >59 mL/min/1.73 Final         Passed - B12 Level in normal range and within 720 days    Vitamin B-12  Date Value Ref Range Status  02/23/2023 956 232 - 1,245 pg/mL Final         Passed - Valid encounter within last 6 months    Recent Outpatient Visits           3 months ago Seizures (HCC)   [facility] [place]. [facility] Joaquin Mulberry, MD   8 months ago Essential hypertension   [facility] - [facility] Of [place]. [facility] Valente Gaskin, RPH-CPP   9 months ago Essential hypertension   [facility] Dept Of [place]. [facility] Vernell Goldsmith, MD   10 months ago Essential hypertension   [facility] Of [place]. [facility] Valente Gaskin, RPH-CPP   11 months ago Essential hypertension   [facility] Dept Of [place]. [facility] [place], Rutledge L, RPH-CPP              Passed - CBC within normal limits and completed in the last 12 months    WBC  Date Value Ref Range Status  09/15/2022 7.4 3.4 - 10.8 x10E3/uL Final  02/18/2016 6.9 3.8 - 10.8 K/uL Final   RBC  Date Value Ref Range Status  09/15/2022 4.75 3.77 - 5.28 x10E6/uL Final  02/18/2016 4.74 3.80 - 5.10 MIL/uL Final   Hemoglobin  Date Value Ref Range Status  09/15/2022 12.9 11.1 - 15.9 g/dL Final   Hematocrit  Date Value Ref Range Status  09/15/2022 40.1 34.0 - 46.6 % Final   MCHC  Date Value Ref Range Status  09/15/2022 32.2 31.5 - 35.7 g/dL Final  16/10/9602 54.0 32.0 - 36.0 g/dL Final   [facility]  Date Value Ref Range Status   09/15/2022 27.2 26.6 - 33.0 pg Final  02/18/2016 26.8 (L) 27.0 - 33.0 pg Final   MCV  Date Value Ref Range Status  09/15/2022 84 79 - 97 fL Final   No results found for: "PLTCOUNTKUC", "LABPLAT", "POCPLA" RDW  Date Value Ref Range Status  09/15/2022 14.7 11.7 - 15.4 % Final          levETIRAcetam  (KEPPRA  XR) 500 MG 24 hr tablet 270 tablet 0    Sig: Take 1 tablet (500 mg total) by mouth 3 (three) times daily.     Neurology:  Anticonvulsants - levetiracetam  Failed - 06/18/2023  1:43 PM      Failed - Completed PHQ-2 or PHQ-9 in the last 360 days      Passed - Cr in normal range and within 360 days    Creat  Date Value Ref Range Status  02/18/2016 0.89 0.50 - 1.05 mg/dL Final    Comment:      For patients > or = 66 years of age: The upper reference limit for Creatinine is approximately 13% higher for people identified as African-American.      Creatinine, Ser  Date Value Ref Range Status  02/23/2023 0.74 0.57 - 1.00 mg/dL Final         Passed - Valid encounter within last 12 months    Recent Outpatient Visits           3 months ago Seizures (HCC)   [facility] [place]. [facility] Joaquin Mulberry, MD   8 months ago Essential hypertension   [facility] - [facility] Dept Of [place]. [facility] Valente Gaskin, RPH-CPP   9 months ago Essential hypertension   [facility] - A Dept Of [place]. [facility] Vernell Goldsmith, MD   10 months ago Essential hypertension   [facility] - A Dept Of [place]. [facility] Valente Gaskin, RPH-CPP   11 months ago Essential hypertension   [facility] - A Dept Of [place]. [facility] Valente Gaskin, RPH-CPP

## 2023-06-30 ENCOUNTER — Other Ambulatory Visit: Payer: Self-pay

## 2023-07-13 ENCOUNTER — Ambulatory Visit: Payer: Medicaid Other | Admitting: Neurology

## 2023-07-14 ENCOUNTER — Ambulatory Visit

## 2023-07-19 ENCOUNTER — Ambulatory Visit
Admission: RE | Admit: 2023-07-19 | Discharge: 2023-07-19 | Disposition: A | Discharge status: Home / Self Care | Source: Ambulatory Visit | Attending: Family Medicine | Admitting: Family Medicine

## 2023-07-19 DIAGNOSIS — Z1231 Encounter for screening mammogram for malignant neoplasm of breast: Secondary | ICD-10-CM | POA: Diagnosis not present

## 2023-07-19 DIAGNOSIS — Z0000 Encounter for general adult medical examination without abnormal findings: Secondary | ICD-10-CM

## 2023-07-21 ENCOUNTER — Ambulatory Visit: Payer: Self-pay | Admitting: Family Medicine

## 2023-07-26 ENCOUNTER — Other Ambulatory Visit: Payer: Self-pay

## 2023-07-27 ENCOUNTER — Other Ambulatory Visit: Payer: Self-pay

## 2023-08-03 ENCOUNTER — Ambulatory Visit: Attending: Family Medicine | Admitting: Family Medicine

## 2023-08-03 ENCOUNTER — Other Ambulatory Visit: Payer: Self-pay

## 2023-08-03 VITALS — BP 209/93 | HR 69 | Temp 98.2°F | Ht 61.0 in | Wt 209.2 lb

## 2023-08-03 DIAGNOSIS — I10 Essential (primary) hypertension: Secondary | ICD-10-CM | POA: Diagnosis not present

## 2023-08-03 DIAGNOSIS — F5101 Primary insomnia: Secondary | ICD-10-CM

## 2023-08-03 DIAGNOSIS — R7303 Prediabetes: Secondary | ICD-10-CM | POA: Diagnosis not present

## 2023-08-03 DIAGNOSIS — R569 Unspecified convulsions: Secondary | ICD-10-CM | POA: Diagnosis not present

## 2023-08-03 DIAGNOSIS — F331 Major depressive disorder, recurrent, moderate: Secondary | ICD-10-CM | POA: Diagnosis not present

## 2023-08-03 LAB — POCT GLYCOSYLATED HEMOGLOBIN (HGB A1C): Hemoglobin A1C: 5.9 % — AB (ref 4.0–5.6)

## 2023-08-03 MED ORDER — DULOXETINE HCL 60 MG PO CPEP
60.0000 mg | ORAL_CAPSULE | Freq: Every day | ORAL | 1 refills | Status: AC
  Filled 2023-08-03: qty 90, 90d supply, fill #0

## 2023-08-03 MED ORDER — METFORMIN HCL 500 MG PO TABS
500.0000 mg | ORAL_TABLET | Freq: Every day | ORAL | 1 refills | Status: AC
  Filled 2023-11-03: qty 90, 90d supply, fill #0
  Filled 2024-01-21: qty 90, 90d supply, fill #1

## 2023-08-03 MED ORDER — MIRTAZAPINE 15 MG PO TABS
15.0000 mg | ORAL_TABLET | Freq: Every day | ORAL | 1 refills | Status: AC
  Filled 2023-08-03: qty 90, 90d supply, fill #0

## 2023-08-03 MED ORDER — SPIRONOLACTONE 50 MG PO TABS
50.0000 mg | ORAL_TABLET | Freq: Every day | ORAL | 1 refills | Status: AC
  Filled 2023-09-14: qty 90, 90d supply, fill #0
  Filled 2024-01-21: qty 90, 90d supply, fill #1

## 2023-08-03 MED ORDER — CARVEDILOL 25 MG PO TABS
25.0000 mg | ORAL_TABLET | Freq: Two times a day (BID) | ORAL | 1 refills | Status: AC
  Filled 2023-08-03: qty 180, 90d supply, fill #0
  Filled 2024-01-21: qty 180, 90d supply, fill #1

## 2023-08-03 MED ORDER — AMLODIPINE BESYLATE 10 MG PO TABS
10.0000 mg | ORAL_TABLET | Freq: Every day | ORAL | 1 refills | Status: AC
  Filled 2023-09-14: qty 90, 90d supply, fill #0

## 2023-08-03 MED ORDER — HYDRALAZINE HCL 50 MG PO TABS
50.0000 mg | ORAL_TABLET | Freq: Three times a day (TID) | ORAL | 1 refills | Status: AC
  Filled 2023-09-14: qty 90, 30d supply, fill #0
  Filled 2023-11-03: qty 270, 90d supply, fill #1
  Filled 2024-01-21: qty 180, 60d supply, fill #2

## 2023-08-03 MED ORDER — HYDRALAZINE HCL 50 MG PO TABS
50.0000 mg | ORAL_TABLET | Freq: Once | ORAL | Status: AC
  Administered 2023-08-03: 50 mg via ORAL

## 2023-08-03 NOTE — Progress Notes (Signed)
 Subjective:  Patient ID: [name], female    DOB: 09-May-1955  Age: 68 y.o. MRN: [id number]  CC: Hypertension     Discussed the use of AI scribe software for clinical note transcription with the patient, who gave verbal consent to proceed.  History of Present Illness [name] is a 68 year old female with prediabetes, hypertension, depression, seizures, neuropathy, carpal tunnel syndrome who presents with difficulty managing her medication schedule.  She struggles with managing her medication schedule. Her blood pressure remains elevated, and she has not taken her prescribed carvedilol  today. She is unsure about the timing of her spironolactone . She has acquired a pill box and requests a written schedule for medication timing.  She experienced a seizure this morning from 7:00 to 7:04 AM and had another seizure earlier this month. She takes Keppra  500 mg three times daily but is not consistent with her medication adherence. She is scheduled to see her neurologist on the 17th.  She lives alone and experiences anxiety, particularly around medical appointments, affecting her sleep. She did not sleep well last night due to nervousness about today's visit.  Her blood sugar levels have improved, with a recent reading of 5.9 and she is on for prediabetes.    Past Medical History:  Diagnosis Date   Anxiety    Carpal tunnel syndrome    Diabetes mellitus without complication (HCC)    Diabetes mellitus, type II (HCC)    DYSTONIA 01/04/2007   Qualifier: Diagnosis of  By: Gerlene NP, Devere     Facial droop 10/09/2013   Hemorrhoid    Hypertension    Major depressive disorder, recurrent episode (HCC) 03/18/2006   Qualifier: Diagnosis of  By: Geralene Service     Obesity, unspecified 10/17/2007   Qualifier: Diagnosis of  By: Wonda PHD, Jeannie     Prediabetes 10/09/2013   Rectal bleeding 07/03/2015   Seizures (HCC)    Seizures (HCC) 08/24/2013    Past Surgical History:  Procedure  Laterality Date   ABDOMINAL HYSTERECTOMY     COLONOSCOPY N/A 07/15/2015   Dr. Harvey: small-mouth diverticula, small non-bleeding internal hemorrhoids, redundant colon    HERNIA REPAIR     MYOMECTOMY      Family History  Problem Relation Age of Onset   Asthma Mother    Hypertension Father    Heart disease Father    Stroke Father    Cancer Sister        uterus cancer    Diabetes Maternal Uncle    Breast cancer Cousin    Colon cancer Neg Hx     Social History   Socioeconomic History   Marital status: Divorced    Spouse name: Not on file   Number of children: 1   Years of education: Not on file   Highest education level: Not on file  Occupational History   Not on file  Tobacco Use   Smoking status: Never   Smokeless tobacco: Never  Vaping Use   Vaping status: Never Used  Substance and Sexual Activity   Alcohol use: No    Alcohol/week: 0.0 standard drinks of alcohol   Drug use: No   Sexual activity: Not Currently  Other Topics Concern   Not on file  Social History Narrative   Right handed    Lives alone    Lives in apartment  on 6th floor    No caffeine   Social Drivers of Health   Financial Resource Strain: Low Risk  (04/14/2022)  Overall Financial Resource Strain (CARDIA)    Difficulty of Paying Living Expenses: Not hard at all  Food Insecurity: Food Insecurity Present (04/14/2022)   Hunger Vital Sign    Worried About Running Out of Food in the Last Year: Sometimes true    Ran Out of Food in the Last Year: Never true  Transportation Needs: No Transportation Needs (04/14/2022)   PRAPARE - Administrator, Civil Service (Medical): No    Lack of Transportation (Non-Medical): No  Physical Activity: Sufficiently Active (04/14/2022)   Exercise Vital Sign    Days of Exercise per Week: 5 days    Minutes of Exercise per Session: 30 min  Stress: Stress Concern Present (04/14/2022)   [employer] of Occupational Health - Occupational Stress  Questionnaire    Feeling of Stress : To some extent  Social Connections: Socially Isolated (04/14/2022)   Social Connection and Isolation Panel    Frequency of Communication with Friends and Family: Twice a week    Frequency of Social Gatherings with Friends and Family: Twice a week    Attends Religious Services: Never    Database administrator or Organizations: No    Attends Engineer, structural: Never    Marital Status: Never married    Allergies  Allergen Reactions   Codeine Anaphylaxis   Shellfish Allergy Swelling and Rash    Throat Sweling   Apple Cider Vinegar     syncope   Cinnamon Hives   Cozaar  [Losartan ]     Makes her feel like she is having a heart attack   Lisinopril      cough   Opium     Interferes with Keppra    Banana Rash    Cannot eat any while taking KEPPRA    Catfish [Fish Allergy] Rash   Orange Fruit [Citrus] Rash    Cannot have while taking KEPPRA    Tomato Itching and Rash    Outpatient Medications Prior to Visit  Medication Sig Dispense Refill   Accu-Chek Softclix Lancets lancets Use as instructed 100 each 12   Blood Glucose Monitoring Suppl (ACCU-CHEK GUIDE) w/Device KIT Use to check blood sugar twice day 1 kit 0   Blood Pressure Monitoring (BLOOD PRESSURE KIT) DEVI Use to measure blood pressure 1 each 0   glucose blood (ACCU-CHEK GUIDE) test strip Use as instructed 100 each 12   levETIRAcetam  (KEPPRA  XR) 500 MG 24 hr tablet Take 1 tablet (500 mg total) by mouth 3 (three) times daily. 270 tablet 0   loratadine  (CLARITIN ) 10 MG tablet Take 1 tablet (10 mg total) by mouth daily. 60 tablet 2   tretinoin  (RETIN-A ) 0.05 % cream Apply topically at bedtime. 45 g 2   Vitamin D , Ergocalciferol , (DRISDOL ) 1.25 MG (50000 UNIT) CAPS capsule Take 1 capsule (50,000 Units total) by mouth once a week. 12 capsule 3   amLODipine  (NORVASC ) 10 MG tablet Take 1 tablet (10 mg total) by mouth daily. 90 tablet 3   carvedilol  (COREG ) 25 MG tablet Take 1 tablet (25 mg  total) by mouth 2 (two) times daily with a meal. 180 tablet 1   DULoxetine  (CYMBALTA ) 60 MG capsule Take 1 capsule (60 mg total) by mouth daily. For neuropathy 90 capsule 1   hydrALAZINE  (APRESOLINE ) 50 MG tablet Take 1 tablet (50 mg total) by mouth 3 (three) times daily. 270 tablet 1   metFORMIN  (GLUCOPHAGE ) 500 MG tablet Take 1 tablet (500 mg total) by mouth daily with breakfast. 90 tablet 0  mirtazapine  (REMERON ) 30 MG tablet Take 1 tablet (30 mg total) by mouth at bedtime. 90 tablet 1   spironolactone  (ALDACTONE ) 50 MG tablet Take 1 tablet (50 mg total) by mouth daily. 90 tablet 2   No facility-administered medications prior to visit.     ROS Review of Systems  Constitutional:  Negative for activity change and appetite change.  HENT:  Negative for sinus pressure and sore throat.   Respiratory:  Negative for chest tightness, shortness of breath and wheezing.   Cardiovascular:  Negative for chest pain and palpitations.  Gastrointestinal:  Negative for abdominal distention, abdominal pain and constipation.  Genitourinary: Negative.   Musculoskeletal: Negative.   Neurological:  Positive for seizures.  Psychiatric/Behavioral:  Positive for sleep disturbance. Negative for behavioral problems and dysphoric mood. The patient is nervous/anxious.        Positive for anxiety    Objective:  BP (!) 209/93   Pulse 69   Temp 98.2 F (36.8 C) (Oral)   Ht 5' 1 (1.549 m)   Wt 209 lb 3.2 oz (94.9 kg)   SpO2 98%   BMI 39.53 kg/m      08/03/2023   11:07 AM 08/03/2023   10:32 AM 08/03/2023    9:43 AM  BP/Weight  Systolic BP 209 209 209  Diastolic BP 93 93 97  Wt. (Lbs)   209.2  BMI   39.53 kg/m2      Physical Exam Constitutional:      Appearance: She is well-developed.  Cardiovascular:     Rate and Rhythm: Normal rate.     Heart sounds: Normal heart sounds. No murmur heard. Pulmonary:     Effort: Pulmonary effort is normal.     Breath sounds: Normal breath sounds. No wheezing  or rales.  Chest:     Chest wall: No tenderness.  Abdominal:     General: Bowel sounds are normal. There is no distension.     Palpations: Abdomen is soft. There is no mass.     Tenderness: There is no abdominal tenderness.  Musculoskeletal:        General: Normal range of motion.     Right lower leg: No edema.     Left lower leg: No edema.  Neurological:     Mental Status: She is alert and oriented to person, place, and time.  Psychiatric:        Mood and Affect: Mood normal.        Latest Ref Rng & Units 02/23/2023   11:06 AM 09/15/2022    9:28 AM 06/04/2022    1:42 PM  CMP  Glucose 70 - 99 mg/dL 81  82  79   BUN 8 - 27 mg/dL 10  11  11    Creatinine 0.57 - 1.00 mg/dL 9.25  8.96  9.22   Sodium 134 - 144 mmol/L 142  138  141   Potassium 3.5 - 5.2 mmol/L 3.8  5.0  4.2   Chloride 96 - 106 mmol/L 102  103  103   CO2 20 - 29 mmol/L 25  21  23    Calcium 8.7 - 10.3 mg/dL 9.9  9.5  9.9   Total Protein 6.0 - 8.5 g/dL 7.2     Total Bilirubin 0.0 - 1.2 mg/dL 0.3     Alkaline Phos 44 - 121 IU/L 103     AST 0 - 40 IU/L 19     ALT 0 - 32 IU/L 15       Lipid  Panel     Component Value Date/Time   CHOL 103 09/15/2022 0928   TRIG 115 09/15/2022 0928   HDL 42 09/15/2022 0928   CHOLHDL 2.5 09/15/2022 0928   CHOLHDL 2.3 02/18/2016 0948   VLDL 25 02/18/2016 0948   LDLCALC 40 09/15/2022 0928    CBC    Component Value Date/Time   WBC 7.4 09/15/2022 0928   WBC 6.9 02/18/2016 0948   RBC 4.75 09/15/2022 0928   RBC 4.74 02/18/2016 0948   HGB 12.9 09/15/2022 0928   HCT 40.1 09/15/2022 0928   PLT 314 09/15/2022 0928   MCV 84 09/15/2022 0928   MCH 27.2 09/15/2022 0928   MCH 26.8 (L) 02/18/2016 0948   MCHC 32.2 09/15/2022 0928   MCHC 32.8 02/18/2016 0948   RDW 14.7 09/15/2022 0928   LYMPHSABS 2.1 09/15/2022 0928   MONOABS 483 02/18/2016 0948   EOSABS 0.1 09/15/2022 0928   BASOSABS 0.0 09/15/2022 0928    Lab Results  Component Value Date   HGBA1C 5.9 (A) 08/03/2023        Assessment & Plan Hypertension Blood pressure significantly elevated at 209/97 mmHg and 220/102 mmHg. Non-compliance with medication regimen noted. - Create a medication schedule to improve adherence. - Administer 50 mg hydralazine  and recheck blood pressure before discharge. - Instruct her to take blood pressure medication as prescribed. - Schedule follow-up in one month to reassess blood pressure control.  Seizure disorder Recent seizure activity with stress and anxiety as potential triggers. Inconsistent adherence to levetiracetam  500 mg TID. - Create a medication schedule to improve adherence. - Encourage her to keep appointment with neurologist on July 17th for further evaluation and potential adjustment of levetiracetam  dosage.   Major depressive disorder Stable Currently on Cymbalta    Insomnia Last night's insomnia was worse due to upcoming doctors appointment Currently on Remeron  for insomnia   Prediabetes Labs reveal prediabetes with an A1c of 5.9.  An A1c of 6.5 is supportive of a diagnosis of type 2 diabetes mellitus.  Working on a low carbohydrate diet, exercise, weight loss is recommended in order to prevent progression to type 2 diabetes mellitus. Continue metformin    Meds ordered this encounter  Medications   hydrALAZINE  (APRESOLINE ) tablet 50 mg   mirtazapine  (REMERON ) 15 MG tablet    Sig: Take 1 tablet (15 mg total) by mouth at bedtime.    Dispense:  90 tablet    Refill:  1   amLODipine  (NORVASC ) 10 MG tablet    Sig: Take 1 tablet (10 mg total) by mouth daily.    Dispense:  90 tablet    Refill:  1   carvedilol  (COREG ) 25 MG tablet    Sig: Take 1 tablet (25 mg total) by mouth 2 (two) times daily with a meal.    Dispense:  180 tablet    Refill:  1   DULoxetine  (CYMBALTA ) 60 MG capsule    Sig: Take 1 capsule (60 mg total) by mouth daily. For neuropathy    Dispense:  90 capsule    Refill:  1   hydrALAZINE  (APRESOLINE ) 50 MG tablet    Sig: Take 1  tablet (50 mg total) by mouth 3 (three) times daily.    Dispense:  270 tablet    Refill:  1   metFORMIN  (GLUCOPHAGE ) 500 MG tablet    Sig: Take 1 tablet (500 mg total) by mouth daily with breakfast.    Dispense:  90 tablet    Refill:  1   spironolactone  (  ALDACTONE ) 50 MG tablet    Sig: Take 1 tablet (50 mg total) by mouth daily.    Dispense:  90 tablet    Refill:  1    Follow-up: Return in about 1 month (around 09/03/2023) for Blood Pressure follow-up with PCP.       Corrina Sabin, MD, FAAFP. [facility] [place], [place] [phone]   08/03/2023, 11:55 AM

## 2023-08-03 NOTE — Patient Instructions (Signed)
 Managing Your Hypertension Hypertension, also called high blood pressure, is when the force of the blood pressing against the walls of the arteries is too strong. Arteries are blood vessels that carry blood from your heart throughout your body. Hypertension forces the heart to work harder to pump blood and may cause the arteries to become narrow or stiff. Understanding blood pressure readings A blood pressure reading includes a higher number over a lower number: The first, or top, number is called the systolic pressure. It is a measure of the pressure in your arteries as your heart beats. The second, or bottom number, is called the diastolic pressure. It is a measure of the pressure in your arteries as the heart relaxes. For most people, a normal blood pressure is below 120/80. Your personal target blood pressure may vary depending on your medical conditions, your age, and other factors. Blood pressure is classified into four stages. Based on your blood pressure reading, your health care provider may use the following stages to determine what type of treatment you need, if any. Systolic pressure and diastolic pressure are measured in a unit called millimeters of mercury (mmHg). Normal Systolic pressure: below 120. Diastolic pressure: below 80. Elevated Systolic pressure: 120-129. Diastolic pressure: below 80. Hypertension stage 1 Systolic pressure: 130-139. Diastolic pressure: 80-89. Hypertension stage 2 Systolic pressure: 140 or above. Diastolic pressure: 90 or above. How can this condition affect me? Managing your hypertension is very important. Over time, hypertension can damage the arteries and decrease blood flow to parts of the body, including the brain, heart, and kidneys. Having untreated or uncontrolled hypertension can lead to: A heart attack. A stroke. A weakened blood vessel (aneurysm). Heart failure. Kidney damage. Eye damage. Memory and concentration problems. Vascular  dementia. What actions can I take to manage this condition? Hypertension can be managed by making lifestyle changes and possibly by taking medicines. Your health care provider will help you make a plan to bring your blood pressure within a normal range. You may be referred for counseling on a healthy diet and physical activity. Nutrition  Eat a diet that is high in fiber and potassium, and low in salt (sodium), added sugar, and fat. An example eating plan is called the DASH diet. DASH stands for Dietary Approaches to Stop Hypertension. To eat this way: Eat plenty of fresh fruits and vegetables. Try to fill one-half of your plate at each meal with fruits and vegetables. Eat whole grains, such as whole-wheat pasta, brown rice, or whole-grain bread. Fill about one-fourth of your plate with whole grains. Eat low-fat dairy products. Avoid fatty cuts of meat, processed or cured meats, and poultry with skin. Fill about one-fourth of your plate with lean proteins such as fish, chicken without skin, beans, eggs, and tofu. Avoid pre-made and processed foods. These tend to be higher in sodium, added sugar, and fat. Reduce your daily sodium intake. Many people with hypertension should eat less than 1,500 mg of sodium a day. Lifestyle  Work with your health care provider to maintain a healthy body weight or to lose weight. Ask what an ideal weight is for you. Get at least 30 minutes of exercise that causes your heart to beat faster (aerobic exercise) most days of the week. Activities may include walking, swimming, or biking. Include exercise to strengthen your muscles (resistance exercise), such as weight lifting, as part of your weekly exercise routine. Try to do these types of exercises for 30 minutes at least 3 days a week. Do  not use any products that contain nicotine or tobacco. These products include cigarettes, chewing tobacco, and vaping devices, such as e-cigarettes. If you need help quitting, ask your  health care provider. Control any long-term (chronic) conditions you have, such as high cholesterol or diabetes. Identify your sources of stress and find ways to manage stress. This may include meditation, deep breathing, or making time for fun activities. Alcohol use Do not drink alcohol if: Your health care provider tells you not to drink. You are pregnant, may be pregnant, or are planning to become pregnant. If you drink alcohol: Limit how much you have to: 0-1 drink a day for women. 0-2 drinks a day for men. Know how much alcohol is in your drink. In the [place] one drink equals one 12 oz bottle of beer (355 mL), one 5 oz glass of wine (148 mL), or one 1 oz glass of hard liquor (44 mL). Medicines Your health care provider may prescribe medicine if lifestyle changes are not enough to get your blood pressure under control and if: Your systolic blood pressure is 130 or higher. Your diastolic blood pressure is 80 or higher. Take medicines only as told by your health care provider. Follow the directions carefully. Blood pressure medicines must be taken as told by your health care provider. The medicine does not work as well when you skip doses. Skipping doses also puts you at risk for problems. Monitoring Before you monitor your blood pressure: Do not smoke, drink caffeinated beverages, or exercise within 30 minutes before taking a measurement. Use the bathroom and empty your bladder (urinate). Sit quietly for at least 5 minutes before taking measurements. Monitor your blood pressure at home as told by your health care provider. To do this: Sit with your back straight and supported. Place your feet flat on the floor. Do not cross your legs. Support your arm on a flat surface, such as a table. Make sure your upper arm is at heart level. Each time you measure, take two or three readings one minute apart and record the results. You may also need to have your blood pressure checked regularly by  your health care provider. General information Talk with your health care provider about your diet, exercise habits, and other lifestyle factors that may be contributing to hypertension. Review all the medicines you take with your health care provider because there may be side effects or interactions. Keep all follow-up visits. Your health care provider can help you create and adjust your plan for managing your high blood pressure. Where to find more information National Heart, Lung, and Blood Institute: PopSteam.is American Heart Association: www.heart.org Contact a health care provider if: You think you are having a reaction to medicines you have taken. You have repeated (recurrent) headaches. You feel dizzy. You have swelling in your ankles. You have trouble with your vision. Get help right away if: You develop a severe headache or confusion. You have unusual weakness or numbness, or you feel faint. You have severe pain in your chest or abdomen. You vomit repeatedly. You have trouble breathing. These symptoms may be an emergency. Get help right away. Call 911. Do not wait to see if the symptoms will go away. Do not drive yourself to the hospital. Summary Hypertension is when the force of blood pumping through your arteries is too strong. If this condition is not controlled, it may put you at risk for serious complications. Your personal target blood pressure may vary depending on your medical conditions,  your age, and other factors. For most people, a normal blood pressure is less than 120/80. Hypertension is managed by lifestyle changes, medicines, or both. Lifestyle changes to help manage hypertension include losing weight, eating a healthy, low-sodium diet, exercising more, stopping smoking, and limiting alcohol. This information is not intended to replace advice given to you by your health care provider. Make sure you discuss any questions you have with your health care  provider. Document Revised: 09/19/2020 Document Reviewed: 09/19/2020 Elsevier Patient Education  2024 [employer].

## 2023-08-04 ENCOUNTER — Other Ambulatory Visit: Payer: Self-pay

## 2023-08-05 ENCOUNTER — Ambulatory Visit (INDEPENDENT_AMBULATORY_CARE_PROVIDER_SITE_OTHER): Admitting: Neurology

## 2023-08-05 ENCOUNTER — Other Ambulatory Visit: Payer: Self-pay

## 2023-08-05 ENCOUNTER — Ambulatory Visit: Admitting: Neurology

## 2023-08-05 ENCOUNTER — Encounter: Payer: Self-pay | Admitting: Neurology

## 2023-08-05 VITALS — BP 164/93 | HR 68 | Ht 62.0 in | Wt 210.0 lb

## 2023-08-05 DIAGNOSIS — R569 Unspecified convulsions: Secondary | ICD-10-CM

## 2023-08-05 NOTE — Patient Instructions (Signed)
 Good to see you.  Let's do 1-hour EEG today. If it is normal, we will plan for a 2-day home EEG to capture the blackouts at home  2. Continue Keppra  XR 500mg : three times a day  3. Please call Dr. Alvaro office and ask to be scheduled with the new provider in their office.   4. Follow-up after home EEG, call for any changes

## 2023-08-05 NOTE — Progress Notes (Signed)
 NEUROLOGY FOLLOW UP OFFICE NOTE  [name] [id number] 1955-07-31  HISTORY OF PRESENT ILLNESS: I had the pleasure of seeing [name] in follow-up in the neurology clinic on 08/05/2023.  The patient was last seen a year ago. She is alone in the office. Records and images were personally reviewed where available. She has a diagnosis of seizures with recurrent episodes of loss of consciousness with report of right-sided symptoms initially, then she started having symptoms preceded by pain and discomfort in the her extremities. 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 She was started on Keppra  at [name] last 2015 for seizures. Her 48-hour EEG in 2018 was normal. She brings a calendar of symptoms. She reports that on 01/28/23, it was like her body broke down. She had been consistently going to the gym, then that day she felt dizzy and had to catch herself. She is not sure if she had a nervous breakdown. She has had blackouts, she would lay on the carpet feeling dizzy with her throat tightening. She would usually lay in a fetal position, no falls. She reports an increase in frequency this month, she had then on 7/2, 7/4, 7/6, 3 on 7/11, 7/12, 7/15, and one this morning from 7:00 to 7:04am. She takes the Levetiracetam  500mg  TID but is not consistent, her PCP recently wrote down a schedule to help with medication compliance. Interestingly, she reports she did not have one dizzy spell when she visited [place]. She thinks it is the weather. In the past, seizures have been triggered by stress and anxiety. She reports she is not taking Mirtazapine  because it causes bad dreams.    History on Initial Assessment 11/2014: This is a pleasant 68 yo RH woman with a history of hypertension and a diagnosis of seizures. She reports that neurological symptoms started in 2005 when she would have involuntary movements of the right leg. She states she had lost  her grandmother and this had an effect on her, and had symptoms since then. She had seen neurologist Dr. Glory Cress at that time and was diagnosed with focal limb dystomia, told that this may be caused by a chemical imbalance. She was treated with Botox but did not tolerate it. She reports seeing Dr. Cress from 2006 to 2009, but also at the same time she started having blackouts. She reports that she had a blackout in her doctor's office one time, but instead of being brought to [facility], she was brought to [facility]. She has had blackouts so bad that neighbors would tell her she needed to go to the ER. She usually starts feeling pain in both legs, followed by right-sided shaking, numbness on the left side of her face, throat gets tight, then she falls to the floor. She lives by herself and has woken up on the floor. She reports that she can see herself shaking and would go into a fetal position to ride it out.   In August 2015, she was brought to the ER due to recent increase in frequency of spells. She reported bilateral numbness and tingling as well as a feeling of discomfort in both upper and lower extremities. She has been told she has convulsions involving her legs. She reported urinary incontinence and tongue soreness. Her BP was very high at that time. 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 No definite gliosis of heterotopic gray matter. Her routine EEG was normal. She was discharged home on Keppra  1000mg  BID which she has been tolerating without side effects. She reports doing well with no similar symptoms until the October 2016. She brings a calendar of her events, She had one last 10/30/14. She reports she had been stressed because her daughter told her that day that she is moving  to [place]  to what her mother thinks is an unhealthy relationship. She had another episode on 10/30 while walking. On 11/25/14 she had 2 episodes, one lasting 12 minutes, another one lasting an hour. She reports being in the shower, she grabbed the handle and sat in the tub rocking in a fetal position, which she found helps. She believes stress is causing the recent seizures and talking to a therapist does help her. She states she does not know how to relax. 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 There is no history of febrile convulsions, CNS infections such as meningitis/encephalitis, significant traumatic brain injury, neurosurgical procedures, or family history of seizures.  Diagnostic Data: She had a 48-hour EEG which was normal. She reported numbness in her right hand and arm, sharp stomach pain, and dizziness. There were no epileptiform discharges seen during these events. The day she reported the dizziness, she did not push the button, but there was note a a transient significant increase in heart rate from 78 bpm to 168 bpm for around 2 minutes, she was not on video at that time. She had a myocardial perfusion scan last 11/2016 which showed an EF of 66%, small defect of moderate severity in the mid anterior and apical anterior location consistent with ischemia, but given normal systolic function, could not rule out breast attenuation artifact. She was started on a beta blocker with improvement in tachycardia, 30-day holter with normal sinus rhythm, no arrhythmia. She has been evaluated by Cardiology.   PAST MEDICAL HISTORY: Past Medical History:  Diagnosis Date   Anxiety    Carpal tunnel syndrome    Diabetes mellitus without complication (HCC)    Diabetes mellitus, type II (HCC)    DYSTONIA 01/04/2007   Qualifier: Diagnosis of  By: Gerlene NP, Devere     Facial droop 10/09/2013   Hemorrhoid    Hypertension    Major depressive disorder, recurrent  episode (HCC) 03/18/2006   Qualifier: Diagnosis of  By: Geralene Service     Obesity, unspecified 10/17/2007   Qualifier: Diagnosis of  By: Wonda PHD, Jeannie     Prediabetes 10/09/2013   Rectal bleeding 07/03/2015   Seizures (HCC)    Seizures (HCC) 08/24/2013    MEDICATIONS: Current Outpatient Medications on File Prior to Visit  Medication Sig Dispense Refill   Accu-Chek Softclix Lancets lancets Use as instructed 100 each 12   amLODipine  (NORVASC ) 10 MG tablet Take 1 tablet (10 mg total) by mouth daily. 90 tablet 1   Blood Glucose Monitoring Suppl (ACCU-CHEK GUIDE) w/Device KIT Use to check blood sugar twice day 1 kit 0   Blood Pressure Monitoring (BLOOD PRESSURE KIT) DEVI Use to measure blood pressure 1 each 0   carvedilol  (COREG ) 25 MG tablet Take 1 tablet (25 mg total) by mouth 2 (two) times daily with a meal. 180 tablet 1   DULoxetine  (CYMBALTA ) 60 MG capsule Take 1 capsule (60 mg total) by mouth daily. For neuropathy 90 capsule 1   glucose blood (  ACCU-CHEK GUIDE) test strip Use as instructed 100 each 12   hydrALAZINE  (APRESOLINE ) 50 MG tablet Take 1 tablet (50 mg total) by mouth 3 (three) times daily. 270 tablet 1   levETIRAcetam  (KEPPRA  XR) 500 MG 24 hr tablet Take 1 tablet (500 mg total) by mouth 3 (three) times daily. 270 tablet 0   loratadine  (CLARITIN ) 10 MG tablet Take 1 tablet (10 mg total) by mouth daily. 60 tablet 2   metFORMIN  (GLUCOPHAGE ) 500 MG tablet Take 1 tablet (500 mg total) by mouth daily with breakfast. 90 tablet 1   mirtazapine  (REMERON ) 15 MG tablet Take 1 tablet (15 mg total) by mouth at bedtime. 90 tablet 1   spironolactone  (ALDACTONE ) 50 MG tablet Take 1 tablet (50 mg total) by mouth daily. 90 tablet 1   tretinoin  (RETIN-A ) 0.05 % cream Apply topically at bedtime. 45 g 2   Vitamin D , Ergocalciferol , (DRISDOL ) 1.25 MG (50000 UNIT) CAPS capsule Take 1 capsule (50,000 Units total) by mouth once a week. 12 capsule 3   No current facility-administered medications on  file prior to visit.    ALLERGIES: Allergies  Allergen Reactions   Codeine Anaphylaxis   Shellfish Allergy Swelling and Rash    Throat Sweling   Apple Cider Vinegar     syncope   Cinnamon Hives   Cozaar  [Losartan ]     Makes her feel like she is having a heart attack   Lisinopril      cough   Opium     Interferes with Keppra    Banana Rash    Cannot eat any while taking KEPPRA    Catfish [Fish Allergy] Rash   Orange Fruit [Citrus] Rash    Cannot have while taking KEPPRA    Tomato Itching and Rash    FAMILY HISTORY: Family History  Problem Relation Age of Onset   Asthma Mother    Hypertension Father    Heart disease Father    Stroke Father    Cancer Sister        uterus cancer    Diabetes Maternal Uncle    Breast cancer Cousin    Colon cancer Neg Hx     SOCIAL HISTORY: Social History   Socioeconomic History   Marital status: Divorced    Spouse name: Not on file   Number of children: 1   Years of education: Not on file   Highest education level: Not on file  Occupational History   Not on file  Tobacco Use   Smoking status: Never   Smokeless tobacco: Never  Vaping Use   Vaping status: Never Used  Substance and Sexual Activity   Alcohol use: No    Alcohol/week: 0.0 standard drinks of alcohol   Drug use: No   Sexual activity: Not Currently  Other Topics Concern   Not on file  Social History Narrative   Right handed    Lives alone    Lives in apartment  on 6th floor    No caffeine   Social Drivers of [name]   Financial Resource Strain: Low Risk  (04/14/2022)   Overall Financial Resource Strain (CARDIA)    Difficulty of Paying Living Expenses: Not hard at all  Food Insecurity: Food Insecurity Present (04/14/2022)   Hunger Vital Sign    Worried About Running Out of Food in the Last Year: Sometimes true    Ran Out of Food in the Last Year: Never true  Transportation Needs: No Transportation Needs (04/14/2022)   PRAPARE - Transportation  Lack of  Transportation (Medical): No    Lack of Transportation (Non-Medical): No  Physical Activity: Sufficiently Active (04/14/2022)   Exercise Vital Sign    Days of Exercise per Week: 5 days    Minutes of Exercise per Session: 30 min  Stress: Stress Concern Present (04/14/2022)   [name] - Occupational Stress Questionnaire    Feeling of Stress : To some extent  Social Connections: Socially Isolated (04/14/2022)   Social Connection and Isolation Panel    Frequency of Communication with Friends and Family: Twice a week    Frequency of Social Gatherings with Friends and Family: Twice a week    Attends Religious Services: Never    Database administrator or Organizations: No    Attends Banker Meetings: Never    Marital Status: Never married  Intimate Partner Violence: Not At Risk (04/14/2022)   Humiliation, Afraid, Rape, and Kick questionnaire    Fear of Current or Ex-Partner: No    Emotionally Abused: No    Physically Abused: No    Sexually Abused: No     PHYSICAL EXAM: Vitals:   08/05/23 1008  BP: (!) 174/95  Pulse: 68  SpO2: 97%   General: No acute distress Head:  Normocephalic/atraumatic Skin/Extremities: No rash, no edema Neurological Exam: alert and awake. No aphasia or dysarthria. Fund of knowledge is appropriate.   Attention and concentration are normal.   Cranial nerves: Pupils equal, round. Extraocular movements intact with no nystagmus. Visual fields full.  No facial asymmetry.  Motor: Bulk and tone normal, muscle strength 5/5 throughout with no pronator drift.   Finger to nose testing intact.  Gait slow and cautious, no ataxia. No tremors.    IMPRESSION: This is a pleasant 68 yo RH woman with a history of hypertension and recurrent episodes of loss of consciousness with report of right-sided symptoms initially, then symptoms changed, preceded by pain and discomfort in her extremities (LE>UE), or provoked by stress with her daughter.  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 She reports a significant increase in episodes this month, having events near daily, most recently this morning. We will do an EEG today to further characterize symptoms, if normal, we will plan for a 48-hour EEG. Continue current dose of Levetiracetam  ER 500mg  TID for now. She has not seen Psychiatry due to her provider retiring, advised to call to schedule appointment with new provider. She does not drive. Follow-up after 48-hour EEG, call for any changes.    Thank you for allowing me to participate in her care.  Please do not hesitate to call for any questions or concerns.    Darice Shivers, M.D.   CC: Dr. Newlin

## 2023-08-05 NOTE — Progress Notes (Signed)
 EEG complete and ready for review.

## 2023-08-06 ENCOUNTER — Ambulatory Visit: Admitting: Neurology

## 2023-08-11 NOTE — Procedures (Signed)
 ELECTROENCEPHALOGRAM REPORT  Date of Study: 08/05/2023  Patient's Name: [name] MRN: [id number] Date of Birth: August 06, 1955  Referring Provider: Dr. Darice Shivers  Clinical History: This is a 68 year old woman with an increase in seizures this month occurring every 1-2 days, she reports a seizure earlier this morning. Stat EEG for classification.  CNS Active Medications: Keppra , Cymbalta , Remeron   Technical Summary: A multichannel digital EEG recording measured by the international 10-20 system with electrodes applied with paste and impedances below 5000 ohms performed in our laboratory with EKG monitoring in an awake and asleep patient.  Hyperventilation was not performed. Photic stimulation was performed.  The digital EEG was referentially recorded, reformatted, and digitally filtered in a variety of bipolar and referential montages for optimal display.    Description: The patient is awake and asleep during the recording.  During maximal wakefulness, there is a symmetric, medium voltage 10 Hz posterior dominant rhythm that attenuates with eye opening.  The record is symmetric.  During drowsiness and sleep, there is an increase in theta slowing of the background.  Vertex waves and symmetric sleep spindles were seen. Photic stimulation did not elicit any abnormalities.  There were no epileptiform discharges or electrographic seizures seen.    EKG lead was unremarkable.  Impression: This awake and asleep EEG is normal.    Clinical Correlation: A normal EEG does not exclude a clinical diagnosis of epilepsy.  If further clinical questions remain, prolonged EEG may be helpful.  Clinical correlation is advised.   Darice Shivers, M.D.

## 2023-08-15 ENCOUNTER — Ambulatory Visit: Payer: Self-pay | Admitting: Neurology

## 2023-08-16 ENCOUNTER — Other Ambulatory Visit: Payer: Self-pay

## 2023-08-16 DIAGNOSIS — R569 Unspecified convulsions: Secondary | ICD-10-CM

## 2023-08-23 ENCOUNTER — Other Ambulatory Visit: Payer: Self-pay

## 2023-08-31 ENCOUNTER — Telehealth: Payer: Self-pay

## 2023-08-31 DIAGNOSIS — H539 Unspecified visual disturbance: Secondary | ICD-10-CM

## 2023-08-31 DIAGNOSIS — H918X9 Other specified hearing loss, unspecified ear: Secondary | ICD-10-CM

## 2023-08-31 NOTE — Telephone Encounter (Signed)
 Copied from CRM [id number]. Topic: Clinical - Medical Advice >> Aug 31, 2023  2:00 PM Yolanda T wrote: Reason for CRM: patient wants to know if she can get a referral for eye exams and hearing test for her left ear. Patient will need something near her home due to transportation issues

## 2023-08-31 NOTE — Telephone Encounter (Signed)
 Routing to PCP for review.

## 2023-09-01 NOTE — Telephone Encounter (Signed)
 Patient was called and informed of referrals being placed.

## 2023-09-01 NOTE — Telephone Encounter (Signed)
 Referral has been placed.

## 2023-09-01 NOTE — Addendum Note (Signed)
 Addended by: Sarabella Caprio on: 09/01/2023 08:39 AM   Modules accepted: Orders

## 2023-09-02 ENCOUNTER — Telehealth: Payer: Self-pay | Admitting: *Deleted

## 2023-09-07 ENCOUNTER — Encounter: Payer: Self-pay | Admitting: Podiatry

## 2023-09-07 ENCOUNTER — Ambulatory Visit (INDEPENDENT_AMBULATORY_CARE_PROVIDER_SITE_OTHER): Admitting: Podiatry

## 2023-09-07 DIAGNOSIS — M79674 Pain in right toe(s): Secondary | ICD-10-CM

## 2023-09-07 DIAGNOSIS — M79675 Pain in left toe(s): Secondary | ICD-10-CM

## 2023-09-07 DIAGNOSIS — B351 Tinea unguium: Secondary | ICD-10-CM | POA: Diagnosis not present

## 2023-09-07 DIAGNOSIS — E119 Type 2 diabetes mellitus without complications: Secondary | ICD-10-CM | POA: Diagnosis not present

## 2023-09-07 NOTE — Progress Notes (Signed)
 Subjective: Chief Complaint  Patient presents with   Diabetes    DFC NIDDM A1C 5.9. Toenail trim. Neuropathy and calluses, Onychomycosis in the nails.      68 year old female presents the office with concerns of thick, discolored toenails that she cannot trim herself and they are causing discomfort as well as for calluses.  She does not have any ulcerations, or changes. No new concerns today.   PCP: Dr. Corrina Sabin, MD Last seen 02/23/2023  A1c 5.9 on 08/03/2023  Objective: AAO x3, NAD DP/PT pulses palpable bilaterally, CRT less than 3 seconds Nails are hypertrophic, dystrophic, brittle, discolored, elongated 10. No surrounding redness or drainage. Tenderness nails 1-5 bilaterally. There is no signs of any hyperpigmentation of the surrounding skin.  The nails are uniform in color but the other nails. Mild hyperkeratotic lesion medial hallux bilaterally, no underlying ulceration, drainage or signs of infection. There is a annular hyper pigmented lesion present on the right third toe consistent with a mole.  Still unchanged. She also has on the left foot that she states has been there my whole life.  Appear to be a form color and borders. No pain with calf compression, swelling, warmth, erythema  Assessment: Type 2 diabetes with neuropathy, symptomatic onychomycosis  Plan: -All treatment options discussed with the patient including all alternatives, risks, complications.  -Sharply debrided nails x10 without any complications or bleeding -As a courtesy, sharply debrided calluses without any complications or bleeding.  -Discussed moisturizer to the feet daily.  -Diabetic shoes were dispensed today.  See separate note for this. -Discussed daily foot inspection -Patient encouraged to call the office with any questions, concerns, change in symptoms.   Return in about 3 months (around 12/08/2023) for nail/callus trim .  Donnice JONELLE Fees DPM

## 2023-09-14 ENCOUNTER — Other Ambulatory Visit: Payer: Self-pay

## 2023-09-14 ENCOUNTER — Encounter: Payer: Self-pay | Admitting: Family Medicine

## 2023-09-14 ENCOUNTER — Ambulatory Visit: Attending: Family Medicine | Admitting: Family Medicine

## 2023-09-14 VITALS — BP 139/83 | HR 60 | Ht 62.0 in | Wt 207.6 lb

## 2023-09-14 DIAGNOSIS — I10 Essential (primary) hypertension: Secondary | ICD-10-CM

## 2023-09-14 NOTE — Patient Instructions (Signed)
 VISIT SUMMARY:  Today, we reviewed your blood pressure management. Your blood pressure has improved significantly since your last visit, and you are doing a great job taking your medications consistently.  YOUR PLAN:  -HYPERTENSION: Hypertension, or high blood pressure, is when the force of the blood against your artery walls is too high. Your blood pressure is now well-controlled at 139/83 mmHg with your current medications. Please continue taking amlodipine , carvedilol , and hydralazine  as prescribed. We will also check your cholesterol, potassium, and kidney function through blood work. Remember to contact our office if you have any concerns.  INSTRUCTIONS:  Please schedule a follow-up appointment in 5 months. Ensure you get your blood work done before the next visit.

## 2023-09-14 NOTE — Progress Notes (Signed)
 Subjective:  Patient ID: [name], female    DOB: [birth year]  Age: 68 y.o. MRN: [id number]  CC: Hypertension     Discussed the use of AI scribe software for clinical note transcription with the patient, who gave verbal consent to proceed.  History of Present Illness [name] is a 68 year old female with prediabetes, hypertension, depression, seizures, neuropathy, carpal tunnel syndrome  who presents for follow-up of her blood pressure management.  Her blood pressure has improved from 209/97 mmHg at her last visit to 139/83 mmHg. She attributes this improvement to taking her medications consistently by separating doses into morning, lunch, and evening. Her current medications include amlodipine , carvedilol , duloxetine , and hydralazine . She has a 90-day supply obtained last month and another 90-day supply available, ensuring coverage for the next five months.    Past Medical History:  Diagnosis Date   Anxiety    Carpal tunnel syndrome    Diabetes mellitus without complication (HCC)    Diabetes mellitus, type II (HCC)    DYSTONIA 01/04/2007   Qualifier: Diagnosis of  By: Gerlene NP, Devere     Facial droop 10/09/2013   Hemorrhoid    Hypertension    Major depressive disorder, recurrent episode (HCC) 03/18/2006   Qualifier: Diagnosis of  By: Geralene Service     Obesity, unspecified 10/17/2007   Qualifier: Diagnosis of  By: Wonda PHD, Jeannie     Prediabetes 10/09/2013   Rectal bleeding 07/03/2015   Seizures (HCC)    Seizures (HCC) 08/24/2013    Past Surgical History:  Procedure Laterality Date   ABDOMINAL HYSTERECTOMY     COLONOSCOPY N/A 07/15/2015   Dr. Harvey: small-mouth diverticula, small non-bleeding internal hemorrhoids, redundant colon    HERNIA REPAIR     MYOMECTOMY      Family History  Problem Relation Age of Onset   Asthma Mother    Hypertension Father    Heart disease Father    Stroke Father    Cancer Sister        uterus cancer    Diabetes Maternal  Uncle    Breast cancer Cousin    Colon cancer Neg Hx     Social History   Socioeconomic History   Marital status: Divorced    Spouse name: Not on file   Number of children: 1   Years of education: Not on file   Highest education level: Not on file  Occupational History   Not on file  Tobacco Use   Smoking status: Never   Smokeless tobacco: Never  Vaping Use   Vaping status: Never Used  Substance and Sexual Activity   Alcohol use: No    Alcohol/week: 0.0 standard drinks of alcohol   Drug use: No   Sexual activity: Not Currently  Other Topics Concern   Not on file  Social History Narrative   Right handed    Lives alone    Lives in apartment  on 6th floor    No caffeine   Social Drivers of Health   Financial Resource Strain: Low Risk  (04/14/2022)   Overall Financial Resource Strain (CARDIA)    Difficulty of Paying Living Expenses: Not hard at all  Food Insecurity: Food Insecurity Present (04/14/2022)   Hunger Vital Sign    Worried About Running Out of Food in the Last Year: Sometimes true    Ran Out of Food in the Last Year: Never true  Transportation Needs: No Transportation Needs (04/14/2022)   PRAPARE - Transportation  Lack of Transportation (Medical): No    Lack of Transportation (Non-Medical): No  Physical Activity: Sufficiently Active (04/14/2022)   Exercise Vital Sign    Days of Exercise per Week: 5 days    Minutes of Exercise per Session: 30 min  Stress: Stress Concern Present (04/14/2022)   [employer] of Occupational Health - Occupational Stress Questionnaire    Feeling of Stress : To some extent  Social Connections: Socially Isolated (04/14/2022)   Social Connection and Isolation Panel    Frequency of Communication with Friends and Family: Twice a week    Frequency of Social Gatherings with Friends and Family: Twice a week    Attends Religious Services: Never    Database administrator or Organizations: No    Attends Engineer, structural:  Never    Marital Status: Never married    Allergies  Allergen Reactions   Codeine Anaphylaxis   Shellfish Allergy Swelling and Rash    Throat Sweling   Apple Cider Vinegar     syncope   Cinnamon Hives   Cozaar  [Losartan ]     Makes her feel like she is having a heart attack   Lisinopril      cough   Opium     Interferes with Keppra    Banana Rash    Cannot eat any while taking KEPPRA    Catfish [Fish Allergy] Rash   Orange Fruit [Citrus] Rash    Cannot have while taking KEPPRA    Tomato Itching and Rash    Outpatient Medications Prior to Visit  Medication Sig Dispense Refill   Accu-Chek Softclix Lancets lancets Use as instructed 100 each 12   amLODipine  (NORVASC ) 10 MG tablet Take 1 tablet (10 mg total) by mouth daily. 90 tablet 1   Blood Glucose Monitoring Suppl (ACCU-CHEK GUIDE) w/Device KIT Use to check blood sugar twice day 1 kit 0   Blood Pressure Monitoring (BLOOD PRESSURE KIT) DEVI Use to measure blood pressure 1 each 0   carvedilol  (COREG ) 25 MG tablet Take 1 tablet (25 mg total) by mouth 2 (two) times daily with a meal. 180 tablet 1   DULoxetine  (CYMBALTA ) 60 MG capsule Take 1 capsule (60 mg total) by mouth daily. For neuropathy 90 capsule 1   glucose blood (ACCU-CHEK GUIDE) test strip Use as instructed 100 each 12   hydrALAZINE  (APRESOLINE ) 50 MG tablet Take 1 tablet (50 mg total) by mouth 3 (three) times daily. 270 tablet 1   levETIRAcetam  (KEPPRA  XR) 500 MG 24 hr tablet Take 1 tablet (500 mg total) by mouth 3 (three) times daily. 270 tablet 0   loratadine  (CLARITIN ) 10 MG tablet Take 1 tablet (10 mg total) by mouth daily. 60 tablet 2   metFORMIN  (GLUCOPHAGE ) 500 MG tablet Take 1 tablet (500 mg total) by mouth daily with breakfast. 90 tablet 1   mirtazapine  (REMERON ) 15 MG tablet Take 1 tablet (15 mg total) by mouth at bedtime. 90 tablet 1   spironolactone  (ALDACTONE ) 50 MG tablet Take 1 tablet (50 mg total) by mouth daily. 90 tablet 1   tretinoin  (RETIN-A ) 0.05 % cream  Apply topically at bedtime. 45 g 2   Vitamin D , Ergocalciferol , (DRISDOL ) 1.25 MG (50000 UNIT) CAPS capsule Take 1 capsule (50,000 Units total) by mouth once a week. 12 capsule 3   No facility-administered medications prior to visit.     ROS Review of Systems  Constitutional:  Negative for activity change and appetite change.  HENT:  Negative for sinus pressure and sore  throat.   Respiratory:  Negative for chest tightness, shortness of breath and wheezing.   Cardiovascular:  Negative for chest pain and palpitations.  Gastrointestinal:  Negative for abdominal distention, abdominal pain and constipation.  Genitourinary: Negative.   Musculoskeletal: Negative.   Neurological:  Positive for weakness.  Psychiatric/Behavioral:  Negative for behavioral problems and dysphoric mood.     Objective:  BP 139/83   Pulse 60   Ht 5' 2 (1.575 m)   Wt 207 lb 9.6 oz (94.2 kg)   SpO2 97%   BMI 37.97 kg/m      09/14/2023   10:11 AM 08/05/2023   10:09 AM 08/05/2023   10:08 AM  BP/Weight  Systolic BP 139 164 174  Diastolic BP 83 93 95  Wt. (Lbs) 207.6  210  BMI 37.97 kg/m2  38.41 kg/m2      Physical Exam Constitutional:      Appearance: She is well-developed.  Cardiovascular:     Rate and Rhythm: Normal rate.     Heart sounds: Normal heart sounds. No murmur heard. Pulmonary:     Effort: Pulmonary effort is normal.     Breath sounds: Normal breath sounds. No wheezing or rales.  Chest:     Chest wall: No tenderness.  Abdominal:     General: Bowel sounds are normal. There is no distension.     Palpations: Abdomen is soft. There is no mass.     Tenderness: There is no abdominal tenderness.  Musculoskeletal:        General: Normal range of motion.     Right lower leg: No edema.     Left lower leg: No edema.  Neurological:     Mental Status: She is alert and oriented to person, place, and time.     Motor: Weakness (RUE, RLE) present.  Psychiatric:        Mood and Affect: Mood  normal.        Latest Ref Rng & Units 02/23/2023   11:06 AM 09/15/2022    9:28 AM 06/04/2022    1:42 PM  CMP  Glucose 70 - 99 mg/dL 81  82  79   BUN 8 - 27 mg/dL 10  11  11    Creatinine 0.57 - 1.00 mg/dL 9.25  8.96  9.22   Sodium 134 - 144 mmol/L 142  138  141   Potassium 3.5 - 5.2 mmol/L 3.8  5.0  4.2   Chloride 96 - 106 mmol/L 102  103  103   CO2 20 - 29 mmol/L 25  21  23    Calcium 8.7 - 10.3 mg/dL 9.9  9.5  9.9   Total Protein 6.0 - 8.5 g/dL 7.2     Total Bilirubin 0.0 - 1.2 mg/dL 0.3     Alkaline Phos 44 - 121 IU/L 103     AST 0 - 40 IU/L 19     ALT 0 - 32 IU/L 15       Lipid Panel     Component Value Date/Time   CHOL 103 09/15/2022 0928   TRIG 115 09/15/2022 0928   HDL 42 09/15/2022 0928   CHOLHDL 2.5 09/15/2022 0928   CHOLHDL 2.3 02/18/2016 0948   VLDL 25 02/18/2016 0948   LDLCALC 40 09/15/2022 0928    CBC    Component Value Date/Time   WBC 7.4 09/15/2022 0928   WBC 6.9 02/18/2016 0948   RBC 4.75 09/15/2022 0928   RBC 4.74 02/18/2016 0948   HGB 12.9 09/15/2022  0928   HCT 40.1 09/15/2022 0928   PLT 314 09/15/2022 0928   MCV 84 09/15/2022 0928   MCH 27.2 09/15/2022 0928   MCH 26.8 (L) 02/18/2016 0948   MCHC 32.2 09/15/2022 0928   MCHC 32.8 02/18/2016 0948   RDW 14.7 09/15/2022 0928   LYMPHSABS 2.1 09/15/2022 0928   MONOABS 483 02/18/2016 0948   EOSABS 0.1 09/15/2022 0928   BASOSABS 0.0 09/15/2022 9071    Lab Results  Component Value Date   HGBA1C 5.9 (A) 08/03/2023       Assessment & Plan Hypertension Hypertension well-controlled with current regimen. Blood pressure improved to 139/83 mmHg. - Continue amlodipine , carvedilol , hydralazine , spironolactone  - Order blood work: cholesterol, potassium, kidney function. - Schedule follow-up in 5 months. - Advised to contact office with concerns.      No orders of the defined types were placed in this encounter.   Follow-up: Return in about 5 months (around 02/14/2024) for Chronic medical  conditions.       Corrina Sabin, MD, FAAFP. [facility] [place], [place] [phone]   09/14/2023, 12:05 PM

## 2023-09-15 ENCOUNTER — Ambulatory Visit: Payer: Self-pay | Admitting: Family Medicine

## 2023-09-15 ENCOUNTER — Other Ambulatory Visit: Payer: Self-pay

## 2023-09-15 LAB — BASIC METABOLIC PANEL WITH GFR
BUN/Creatinine Ratio: 13 (ref 12–28)
BUN: 12 mg/dL (ref 8–27)
CO2: 21 mmol/L (ref 20–29)
Calcium: 9.6 mg/dL (ref 8.7–10.3)
Chloride: 105 mmol/L (ref 96–106)
Creatinine, Ser: 0.93 mg/dL (ref 0.57–1.00)
Glucose: 88 mg/dL (ref 70–99)
Potassium: 4.4 mmol/L (ref 3.5–5.2)
Sodium: 139 mmol/L (ref 134–144)
eGFR: 68 mL/min/1.73 (ref >59)

## 2023-09-16 ENCOUNTER — Ambulatory Visit: Attending: Family Medicine | Admitting: Audiologist

## 2023-09-16 DIAGNOSIS — H9312 Tinnitus, left ear: Secondary | ICD-10-CM | POA: Diagnosis not present

## 2023-09-16 DIAGNOSIS — H903 Sensorineural hearing loss, bilateral: Secondary | ICD-10-CM | POA: Insufficient documentation

## 2023-09-16 NOTE — Procedures (Signed)
  Outpatient Audiology and [facility] [address] [place], [place]  [address] [phone]  AUDIOLOGICAL  EVALUATION  NAME: [name]     DOB:   1955/04/28      MRN: [id number]                                                                                     DATE: 09/16/2023     REFERENT: Delbert Clam, MD STATUS: Outpatient DIAGNOSIS: Sensorineural Hearing Loss, Unilateral Tinnitus   History: Emmalie was seen for an audiological evaluation due to difficulty hearing from her left ear. Jrue says the trouble started when she was a child. She could not hear from her left ear due to chronic infections. The loss went away for a while and came back in adulthood.  Yesha denies pain and pressure in either ear. Dandra has no history of hazardous noise exposure.  Medical history shows neuropathy and history of seizures which are risks for hearing loss.    Evaluation:  Otoscopy showed a clear view of the tympanic membranes, bilaterally Tympanometry results were consistent with normal middle ear function, bilaterally   Audiometric testing was completed using Conventional Audiometry techniques with insert earphones and supraural headphones. Test results are consistent with normal hearing in the right ear sloping to mild hearing loss at [facility] only. Left ear has normal hearing 250-4kHz sloping to moderate hearing loss 6-8kHz. Speech Recognition Thresholds were obtained at  15dB HL in the right ear and at 20dB HL in the left ear. Word Recognition Testing was completed at  40dB SL and Kasha scored 92% in the right ear and 100% in the left ear.   Results:  The test results were reviewed with Avila. Reeanna has normal middle ear function in each ear. She has normal hearing 250-4kHz in each ear. Hear hearing then slopes to a mild loss in the right ear and a moderate loss in the left ear. The asymmetry and unilateral tinnitus warrant evaluation by Otolaryngology. She does not need hearing  aids as aids do not amplify above 4kHz.   Audiogram printed and provided to [facility].      Recommendations: Annual audiometric testing recommended to monitor hearing loss for progression.  Recommend referral to Otolaryngology due to unilateral tinnitus and slight hearing asymmetry.     32 minutes spent testing and counseling on results.   If you have any questions please feel free to contact me at [phone].  Lauraine Ka Stalnaker Au.D.  Audiologist   09/16/2023  1:45 PM  Cc: Newlin, Enobong, MD

## 2023-09-24 ENCOUNTER — Telehealth: Payer: Self-pay | Admitting: *Deleted

## 2023-10-05 ENCOUNTER — Telehealth: Payer: Self-pay | Admitting: *Deleted

## 2023-10-29 ENCOUNTER — Other Ambulatory Visit: Payer: Self-pay | Admitting: Pharmacist

## 2023-10-29 NOTE — Progress Notes (Signed)
 Pharmacy Quality Measure Review  This patient is appearing on a report for the adherence measure for diabetes medications this calendar year.   Medication: metformin  Last fill date: 08/05/2023 for 90 day supply  Insurance report was not up to date. No action needed at this time.  Reminder set for next fill.   Herlene Fleeta Morris, PharmD, JAQUELINE, CPP Clinical Pharmacist [facility] [phone]

## 2023-11-03 ENCOUNTER — Other Ambulatory Visit: Payer: Self-pay | Admitting: Pharmacist

## 2023-11-03 ENCOUNTER — Other Ambulatory Visit (HOSPITAL_COMMUNITY): Payer: Self-pay

## 2023-11-03 ENCOUNTER — Other Ambulatory Visit: Payer: Self-pay

## 2023-11-03 NOTE — Progress Notes (Signed)
 Pharmacy Quality Measure Review  This patient is appearing on a report for the adherence measure for diabetes medications this calendar year.   Medication: metformin  Last fill date: 08/05/2023 for 90 day supply  Collaborated with her pharmacy. Refilled this along with her hydralazine . Contacted the patient and she will pick up this afternoon.  Herlene Fleeta Morris, PharmD, JAQUELINE, CPP Clinical Pharmacist [facility] [phone]

## 2023-11-04 ENCOUNTER — Other Ambulatory Visit: Payer: Self-pay

## 2023-11-05 ENCOUNTER — Other Ambulatory Visit: Payer: Self-pay

## 2023-11-16 ENCOUNTER — Other Ambulatory Visit: Payer: Self-pay

## 2023-12-07 ENCOUNTER — Ambulatory Visit: Admitting: Podiatry

## 2023-12-07 NOTE — Progress Notes (Signed)
 [name]                                          MRN: [id number]   12/07/2023   The [facility]BCI Quality Team Specialist reviewed this patient medical record for the purposes of chart review for care gap closure. The following were reviewed: abstraction for care gap closure-glycemic status assessment.    VBCI Quality Team

## 2023-12-07 NOTE — Progress Notes (Signed)
 [name]                                          MRN: [id number]   12/07/2023   The VBCI Quality Team Specialist reviewed this patient medical record for the purposes of chart review for care gap closure. The following were reviewed: chart review for care gap closure-kidney health evaluation for diabetes:eGFR  and uACR.    VBCI Quality Team

## 2023-12-24 ENCOUNTER — Ambulatory Visit: Admitting: Podiatry

## 2023-12-29 NOTE — Progress Notes (Signed)
 [name]                                          MRN: [id number]   12/29/2023   The VBCI Quality Team Specialist reviewed this patient medical record for the purposes of chart review for care gap closure. The following were reviewed: chart review for care gap closure-kidney health evaluation for diabetes:eGFR  and uACR.    VBCI Quality Team

## 2024-01-21 ENCOUNTER — Other Ambulatory Visit: Payer: Self-pay

## 2024-01-24 ENCOUNTER — Ambulatory Visit: Admitting: Nurse Practitioner

## 2024-01-24 ENCOUNTER — Other Ambulatory Visit: Payer: Self-pay

## 2024-01-25 ENCOUNTER — Other Ambulatory Visit: Payer: Self-pay

## 2024-01-25 ENCOUNTER — Ambulatory Visit: Attending: Family Medicine

## 2024-01-25 VITALS — Ht 62.0 in | Wt 200.0 lb

## 2024-01-25 DIAGNOSIS — Z0000 Encounter for general adult medical examination without abnormal findings: Secondary | ICD-10-CM

## 2024-01-25 NOTE — Patient Instructions (Signed)
 Ms. Lalanne,  Thank you for taking the time for your Medicare Wellness Visit. I appreciate your continued commitment to your health goals. Please review the care plan we discussed, and feel free to reach out if I can assist you further.  Please note that Annual Wellness Visits do not include a physical exam. Some assessments may be limited, especially if the visit was conducted virtually. If needed, we may recommend an in-person follow-up with your provider.  Ongoing Care Seeing your primary care provider every 3 to 6 months helps us  monitor your health and provide consistent, personalized care.   Referrals If a referral was made during today's visit and you haven't received any updates within two weeks, please contact the referred provider directly to check on the status.  Recommended Screenings:  Health Maintenance  Topic Date Due   Medicare Annual Wellness Visit  Never done   Osteoporosis screening with Bone Density Scan  Never done   Yearly kidney health urinalysis for diabetes  10/22/2022   Complete foot exam   02/25/2023   Eye exam for diabetics  07/06/2023   Pneumococcal Vaccine for age over 70 (2 of 2 - PCV) 02/23/2024*   Flu Shot  04/18/2024*   Hemoglobin A1C  02/03/2024   Yearly kidney function blood test for diabetes  09/13/2024   DTaP/Tdap/Td vaccine (3 - Td or Tdap) 02/19/2025   Colon Cancer Screening  07/14/2025   Breast Cancer Screening  07/18/2025   Hepatitis C Screening  Completed   Zoster (Shingles) Vaccine  Completed   Meningitis B Vaccine  Aged Out   COVID-19 Vaccine  Discontinued  *Topic was postponed. The date shown is not the original due date.       01/25/2024    9:18 AM  Advanced Directives  Does Patient Have a Medical Advance Directive? No  Would patient like information on creating a medical advance directive? No - Patient declined    Vision: Annual vision screenings are recommended for early detection of glaucoma, cataracts, and diabetic  retinopathy. These exams can also reveal signs of chronic conditions such as diabetes and high blood pressure.  Dental: Annual dental screenings help detect early signs of oral cancer, gum disease, and other conditions linked to overall health, including heart disease and diabetes.  Please see the attached documents for additional preventive care recommendations.

## 2024-01-25 NOTE — Progress Notes (Signed)
 "  Chief Complaint  Patient presents with   Medicare Wellness    INITIAL     Subjective:   [name] is a 69 y.o. female who presents for a Medicare Annual Wellness Visit.  Visit info / Clinical Intake: Medicare Wellness Visit Type:: Initial Annual Wellness Visit Persons participating in visit and providing information:: patient Medicare Wellness Visit Mode:: Telephone If telephone:: video error Since this visit was completed virtually, some vitals may be partially provided or unavailable. Missing vitals are due to the limitations of the virtual format.: Documented vitals are patient reported If Telephone or Video please confirm:: I connected with patient using audio/video enable telemedicine. I verified patient identity with two identifiers, discussed telehealth limitations, and patient agreed to proceed. 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 : no  Functional Status Activities of Daily Living (to include ambulation/medication): Independent Ambulation: Independent with device- listed below Home Assistive Devices/Equipment: Cane; Elevated toliet seat; Other (Comment); Shower/tub chair (LIFE ALERT, GRAB BARS) Medication  Administration: Independent Home Management (perform basic housework or laundry): Independent Manage your own finances?: yes Primary transportation is: facility / other (BUS) Concerns about vision?: no *vision screening is required for [facility]* ([facility]) Concerns about hearing?: no  Fall Screening Falls in the past year?: 0 Number of falls in past year: 0 Was there an injury with Fall?: 0 Fall Risk Category Calculator: 0 Patient Fall Risk Level: Low Fall Risk  Fall Risk Patient at Risk for Falls Due to: No Fall Risks Fall risk Follow up: Falls evaluation completed; Education provided  Home and Transportation Safety: All rugs have non-skid backing?: N/A, no rugs All stairs or steps have railings?: yes (LIVES ON THE [address]TH FLOOR; THERE ARE ELEVATORS) Grab bars in the bathtub or shower?: yes Have non-skid surface in bathtub or shower?: yes Good home lighting?: yes Regular seat belt use?: yes Hospital stays in the last year:: no  Cognitive Assessment Difficulty concentrating, remembering, or making decisions? : no Will 6CIT or Mini Cog be Completed: yes What year is it?: 0 points What month is it?: 0 points Give patient an address phrase to remember (5 components): [place] [address] About what time is it?: 0 points Count backwards from 20 to 1: 0 points Say the months of the year in reverse: 0 points Repeat the address phrase from earlier: 0 points 6 CIT Score: 0 points  Advance Directives (For Healthcare) Does Patient Have a Medical Advance Directive?: No Would patient like information on creating a medical advance directive?: No - Patient declined  Reviewed/Updated  Reviewed/Updated: Reviewed All (Medical, Surgical, Family, Medications, Allergies, Care Teams, Patient Goals)    Allergies (verified) Codeine, Shellfish allergy, Apple cider vinegar, Cinnamon, Cozaar  [losartan ], Lisinopril , Opium, Banana, Catfish [fish allergy], Orange fruit [citrus],  and Tomato  Current Medications (verified) Outpatient Encounter Medications as of 01/25/2024  Medication Sig   Accu-Chek Softclix Lancets lancets Use as instructed   amLODipine  (NORVASC ) 10 MG tablet Take 1 tablet (10 mg total) by mouth daily.   Blood Glucose Monitoring Suppl (ACCU-CHEK GUIDE) w/Device KIT Use to check blood sugar twice day   Blood Pressure Monitoring (BLOOD PRESSURE KIT) DEVI Use to measure blood pressure   carvedilol  (COREG ) 25 MG tablet Take 1 tablet (25 mg total) by mouth 2 (two) times daily with a meal.   DULoxetine  (CYMBALTA ) 60 MG capsule Take 1 capsule (60 mg total) by mouth daily. For neuropathy   glucose blood (ACCU-CHEK GUIDE) test strip Use as instructed   hydrALAZINE  (APRESOLINE ) 50 MG tablet Take 1 tablet (50 mg total) by mouth 3 (three) times daily.   levETIRAcetam  (KEPPRA  XR) 500 MG 24 hr tablet Take 1 tablet (500 mg total) by mouth 3 (three) times daily.   loratadine  (CLARITIN ) 10 MG tablet Take 1 tablet (10 mg total) by mouth daily.   metFORMIN  (GLUCOPHAGE ) 500 MG tablet Take 1 tablet (500 mg total) by mouth daily with breakfast.   mirtazapine  (REMERON ) 15 MG tablet Take 1 tablet (15 mg total) by mouth at bedtime.   spironolactone  (ALDACTONE ) 50 MG tablet Take 1 tablet (50 mg total) by mouth daily.   tretinoin  (RETIN-A ) 0.05 % cream Apply topically at bedtime.   Vitamin D , Ergocalciferol , (DRISDOL ) 1.25 MG (50000 UNIT) CAPS capsule Take 1 capsule (50,000 Units total) by mouth once a week.   No facility-administered encounter medications on file as of 01/25/2024.    History: Past Medical History:  Diagnosis Date   Anxiety    Carpal tunnel syndrome    Diabetes mellitus without complication (HCC)    Diabetes mellitus, type II (HCC)    DYSTONIA 01/04/2007   Qualifier: Diagnosis of  By: Gerlene NP, Devere     Facial droop 10/09/2013   Hemorrhoid    Hypertension    Major depressive disorder, recurrent episode 03/18/2006   Qualifier: Diagnosis of  By:  Geralene Service     Obesity, unspecified 10/17/2007   Qualifier: Diagnosis of  By: Wonda PHD, Jeannie     Prediabetes 10/09/2013   Rectal bleeding 07/03/2015   Seizures (HCC)    Seizures (HCC) 08/24/2013   Past Surgical History:  Procedure Laterality Date   ABDOMINAL HYSTERECTOMY     COLONOSCOPY N/A 07/15/2015   Dr. Harvey: small-mouth diverticula, small non-bleeding internal hemorrhoids, redundant colon    HERNIA REPAIR     MYOMECTOMY     Family History  Problem Relation Age of Onset   Asthma Mother    Hypertension Father    Heart disease Father    Stroke Father    Cancer Sister        uterus cancer    Diabetes Maternal Uncle    Breast cancer Cousin    Colon cancer Neg Hx    Social History   Occupational History   Not on file  Tobacco Use   Smoking status: Never   Smokeless tobacco: Never  Vaping Use   Vaping status: Never Used  Substance and Sexual Activity   Alcohol use: No    Alcohol/week: 0.0 standard drinks of alcohol   Drug use: No   Sexual activity: Not Currently   Tobacco Counseling Counseling given: Not Answered  SDOH Screenings   Food Insecurity: No Food Insecurity (01/25/2024)  Housing: Low Risk (01/25/2024)  Transportation Needs: No Transportation Needs (01/25/2024)  Utilities: Not  At Risk (01/25/2024)  Alcohol Screen: Low Risk (01/25/2024)  Depression (PHQ2-9): Low Risk (01/25/2024)  Financial Resource Strain: Low Risk (01/25/2024)  Physical Activity: Sufficiently Active (01/25/2024)  Social Connections: Socially Isolated (01/25/2024)  Stress: No Stress Concern Present (01/25/2024)  Tobacco Use: Low Risk (01/25/2024)  Health Literacy: Adequate Health Literacy (01/25/2024)   See flowsheets for full screening details  Depression Screen Depression Screening Exception Documentation Depression Screening Exception:: Patient refusal  PHQ 2 & 9 Depression Scale- Over the past 2 weeks, how often have you been bothered by any of the following problems? Little interest or  pleasure in doing things: 0 Feeling down, depressed, or hopeless (PHQ Adolescent also includes...irritable): 0 PHQ-2 Total Score: 0 Trouble falling or staying asleep, or sleeping too much: 0 Feeling tired or having little energy: 0 Poor appetite or overeating (PHQ Adolescent also includes...weight loss): 0 Feeling bad about yourself - or that you are a failure or have let yourself or your family down: 0 Trouble concentrating on things, such as reading the newspaper or watching television (PHQ Adolescent also includes...like school work): 0 Moving or speaking so slowly that other people could have noticed. Or the opposite - being so fidgety or restless that you have been moving around a lot more than usual: 0 Thoughts that you would be better off dead, or of hurting yourself in some way: 0 PHQ-9 Total Score: 0 If you checked off any problems, how difficult have these problems made it for you to do your work, take care of things at home, or get along with other people?: Not difficult at all  Depression Treatment Depression Interventions/Treatment : EYV7-0 Score <4 Follow-up Not Indicated; Medication     Goals Addressed             This Visit's Progress    01/25/2024: To lose some weight.               Objective:    Today's Vitals   01/25/24 0916  Weight: 200 lb (90.7 kg)  Height: 5' 2 (1.575 m)  PainSc: 0-No pain   Body mass index is 36.58 kg/m.  Hearing/Vision screen Hearing Screening - Comments:: Patient has hearing loss. Vision Screening - Comments:: Patient wears eyeglasses. Immunizations and Health Maintenance Health Maintenance  Topic Date Due   Bone Density Scan  Never done   Diabetic kidney evaluation - Urine ACR  10/22/2022   FOOT EXAM  02/25/2023   OPHTHALMOLOGY EXAM  07/06/2023   Pneumococcal Vaccine: 50+ Years (2 of 2 - PCV) 02/23/2024 (Originally 10/14/2016)   Influenza Vaccine  04/18/2024 (Originally 08/20/2023)   HEMOGLOBIN A1C  02/03/2024   Diabetic  kidney evaluation - eGFR measurement  09/13/2024   Medicare Annual Wellness (AWV)  01/24/2025   DTaP/Tdap/Td (3 - Td or Tdap) 02/19/2025   Colonoscopy  07/14/2025   Mammogram  07/18/2025   Hepatitis C Screening  Completed   Zoster Vaccines- Shingrix  Completed   Meningococcal B Vaccine  Aged Out   COVID-19 Vaccine  Discontinued        Assessment/Plan:  This is a routine wellness examination for [name].  Patient Care Team: Delbert Clam, MD as PCP - General (Family Medicine) Harvey Margo CROME, MD (Inactive) as Consulting Physician (Gastroenterology) Georjean Darice HERO, MD as Consulting Physician (Neurology) Gershon Donnice SAUNDERS, DPM as Consulting Physician (Podiatry)  I have personally reviewed and noted the following in the patients chart:   Medical and social history Use of alcohol, tobacco or illicit drugs  Current medications  and supplements including opioid prescriptions. Functional ability and status Nutritional status Physical activity Advanced directives List of other physicians Hospitalizations, surgeries, and ER visits in previous 12 months Vitals Screenings to include cognitive, depression, and falls Referrals and appointments  No orders of the defined types were placed in this encounter.  In addition, I have reviewed and discussed with patient certain preventive protocols, quality metrics, and best practice recommendations. A written personalized care plan for preventive services as well as general preventive health recommendations were provided to patient.   Roz LOISE Fuller, LPN   08/22/7971   Return in 1 year (on 01/24/2025).  After Visit Summary: (Declined) Due to this being a telephonic visit, with patients personalized plan was offered to patient but patient Declined AVS at this time   Nurse Notes:  Appointment(s) made: (For In Office AWV for 01/30/2025 at 9:10 a.m.) Screenings not ordered: Already had Diabetic Foot Exam screening - Records requested -  Awaiting results HM Addressed: Patient declined vaccines and orders were previously ordered for Diabetic Eye Exam and Bone Density Scan.  "

## 2024-02-07 ENCOUNTER — Other Ambulatory Visit: Payer: Self-pay

## 2024-02-07 ENCOUNTER — Other Ambulatory Visit (HOSPITAL_BASED_OUTPATIENT_CLINIC_OR_DEPARTMENT_OTHER): Payer: Self-pay

## 2024-02-15 ENCOUNTER — Ambulatory Visit: Admitting: Family Medicine

## 2024-02-20 ENCOUNTER — Other Ambulatory Visit: Payer: Self-pay

## 2024-03-07 ENCOUNTER — Ambulatory Visit: Admitting: Podiatry

## 2024-03-14 ENCOUNTER — Ambulatory Visit: Payer: Self-pay | Admitting: Family Medicine

## 2024-08-01 ENCOUNTER — Ambulatory Visit: Payer: Self-pay | Admitting: Neurology

## 2025-01-30 ENCOUNTER — Ambulatory Visit: Payer: Self-pay
